# Patient Record
Sex: Female | Born: 1960 | Race: Black or African American | Hispanic: No | Smoking: Never smoker
Health system: Southern US, Community
[De-identification: ages and names within clinical notes are randomized; demographics above are authoritative.]

## PROBLEM LIST (undated history)

## (undated) DIAGNOSIS — K219 Gastro-esophageal reflux disease without esophagitis: Secondary | ICD-10-CM

## (undated) DIAGNOSIS — D649 Anemia, unspecified: Secondary | ICD-10-CM

## (undated) DIAGNOSIS — R0602 Shortness of breath: Secondary | ICD-10-CM

## (undated) DIAGNOSIS — E119 Type 2 diabetes mellitus without complications: Secondary | ICD-10-CM

## (undated) DIAGNOSIS — F410 Panic disorder [episodic paroxysmal anxiety] without agoraphobia: Secondary | ICD-10-CM

## (undated) DIAGNOSIS — I429 Cardiomyopathy, unspecified: Secondary | ICD-10-CM

## (undated) DIAGNOSIS — D131 Benign neoplasm of stomach: Secondary | ICD-10-CM

## (undated) DIAGNOSIS — E785 Hyperlipidemia, unspecified: Secondary | ICD-10-CM

## (undated) DIAGNOSIS — G43909 Migraine, unspecified, not intractable, without status migrainosus: Secondary | ICD-10-CM

## (undated) DIAGNOSIS — R011 Cardiac murmur, unspecified: Secondary | ICD-10-CM

## (undated) DIAGNOSIS — I10 Essential (primary) hypertension: Secondary | ICD-10-CM

## (undated) DIAGNOSIS — M1990 Unspecified osteoarthritis, unspecified site: Secondary | ICD-10-CM

## (undated) DIAGNOSIS — T7840XA Allergy, unspecified, initial encounter: Secondary | ICD-10-CM

## (undated) HISTORY — DX: Hyperlipidemia, unspecified: E78.5

## (undated) HISTORY — DX: Cardiac murmur, unspecified: R01.1

## (undated) HISTORY — DX: Gastro-esophageal reflux disease without esophagitis: K21.9

## (undated) HISTORY — DX: Anemia, unspecified: D64.9

## (undated) HISTORY — PX: VAGINAL HYSTERECTOMY: SUR661

## (undated) HISTORY — PX: COLONOSCOPY: SHX174

## (undated) HISTORY — DX: Unspecified osteoarthritis, unspecified site: M19.90

## (undated) HISTORY — PX: ENDOMETRIAL ABLATION: SHX621

## (undated) HISTORY — DX: Benign neoplasm of stomach: D13.1

## (undated) HISTORY — DX: Allergy, unspecified, initial encounter: T78.40XA

## (undated) HISTORY — PX: LEFT OOPHORECTOMY: SHX1961

---

## 1988-04-15 HISTORY — PX: CHOLECYSTECTOMY: SHX55

## 1998-07-20 ENCOUNTER — Other Ambulatory Visit: Admission: RE | Admit: 1998-07-20 | Discharge: 1998-07-20 | Payer: Self-pay | Admitting: Obstetrics and Gynecology

## 1998-10-18 ENCOUNTER — Other Ambulatory Visit: Admission: RE | Admit: 1998-10-18 | Discharge: 1998-10-18 | Payer: Self-pay | Admitting: Obstetrics and Gynecology

## 1998-10-19 ENCOUNTER — Encounter (INDEPENDENT_AMBULATORY_CARE_PROVIDER_SITE_OTHER): Payer: Self-pay | Admitting: Specialist

## 1998-10-19 ENCOUNTER — Other Ambulatory Visit: Admission: RE | Admit: 1998-10-19 | Discharge: 1998-10-19 | Payer: Self-pay | Admitting: Obstetrics and Gynecology

## 1999-02-13 ENCOUNTER — Other Ambulatory Visit: Admission: RE | Admit: 1999-02-13 | Discharge: 1999-02-13 | Payer: Self-pay | Admitting: Obstetrics and Gynecology

## 2000-12-18 ENCOUNTER — Other Ambulatory Visit: Admission: RE | Admit: 2000-12-18 | Discharge: 2000-12-18 | Payer: Self-pay | Admitting: Obstetrics and Gynecology

## 2000-12-24 ENCOUNTER — Ambulatory Visit (HOSPITAL_COMMUNITY): Admission: RE | Admit: 2000-12-24 | Discharge: 2000-12-24 | Payer: Self-pay

## 2001-04-15 HISTORY — PX: ORIF TIBIA & FIBULA FRACTURES: SHX2131

## 2001-04-16 ENCOUNTER — Inpatient Hospital Stay (HOSPITAL_COMMUNITY): Admission: AD | Admit: 2001-04-16 | Discharge: 2001-04-17 | Payer: Self-pay | Admitting: *Deleted

## 2001-04-16 ENCOUNTER — Encounter: Payer: Self-pay | Admitting: *Deleted

## 2001-08-13 ENCOUNTER — Encounter: Payer: Self-pay | Admitting: Orthopedic Surgery

## 2001-08-13 ENCOUNTER — Encounter: Payer: Self-pay | Admitting: *Deleted

## 2001-08-14 ENCOUNTER — Inpatient Hospital Stay (HOSPITAL_COMMUNITY): Admission: EM | Admit: 2001-08-14 | Discharge: 2001-08-16 | Payer: Self-pay | Admitting: *Deleted

## 2002-03-01 ENCOUNTER — Other Ambulatory Visit: Admission: RE | Admit: 2002-03-01 | Discharge: 2002-03-01 | Payer: Self-pay | Admitting: Obstetrics and Gynecology

## 2002-03-03 ENCOUNTER — Encounter (INDEPENDENT_AMBULATORY_CARE_PROVIDER_SITE_OTHER): Payer: Self-pay

## 2002-03-04 ENCOUNTER — Inpatient Hospital Stay (HOSPITAL_COMMUNITY): Admission: RE | Admit: 2002-03-04 | Discharge: 2002-03-05 | Payer: Self-pay | Admitting: Obstetrics and Gynecology

## 2003-05-24 ENCOUNTER — Encounter: Admission: RE | Admit: 2003-05-24 | Discharge: 2003-05-24 | Payer: Self-pay | Admitting: Family Medicine

## 2003-06-27 ENCOUNTER — Other Ambulatory Visit: Admission: RE | Admit: 2003-06-27 | Discharge: 2003-06-27 | Payer: Self-pay | Admitting: Obstetrics and Gynecology

## 2004-07-21 ENCOUNTER — Emergency Department (HOSPITAL_COMMUNITY): Admission: EM | Admit: 2004-07-21 | Discharge: 2004-07-21 | Payer: Self-pay | Admitting: Family Medicine

## 2005-12-25 ENCOUNTER — Encounter: Admission: RE | Admit: 2005-12-25 | Discharge: 2005-12-25 | Payer: Self-pay | Admitting: Family Medicine

## 2007-07-29 ENCOUNTER — Encounter: Admission: RE | Admit: 2007-07-29 | Discharge: 2007-07-29 | Payer: Self-pay | Admitting: Family Medicine

## 2007-09-30 ENCOUNTER — Encounter: Admission: RE | Admit: 2007-09-30 | Discharge: 2007-09-30 | Payer: Self-pay | Admitting: Obstetrics and Gynecology

## 2007-12-08 ENCOUNTER — Ambulatory Visit (HOSPITAL_BASED_OUTPATIENT_CLINIC_OR_DEPARTMENT_OTHER): Admission: RE | Admit: 2007-12-08 | Discharge: 2007-12-08 | Payer: Self-pay | Admitting: Family Medicine

## 2007-12-12 ENCOUNTER — Ambulatory Visit: Payer: Self-pay | Admitting: Internal Medicine

## 2009-02-23 ENCOUNTER — Emergency Department (HOSPITAL_COMMUNITY): Admission: EM | Admit: 2009-02-23 | Discharge: 2009-02-23 | Payer: Self-pay | Admitting: Emergency Medicine

## 2009-11-07 ENCOUNTER — Ambulatory Visit: Payer: Self-pay | Admitting: Internal Medicine

## 2009-11-07 DIAGNOSIS — K648 Other hemorrhoids: Secondary | ICD-10-CM | POA: Insufficient documentation

## 2009-11-07 DIAGNOSIS — S335XXA Sprain of ligaments of lumbar spine, initial encounter: Secondary | ICD-10-CM | POA: Insufficient documentation

## 2010-05-15 NOTE — Assessment & Plan Note (Signed)
Summary: blood in stool-lb   Vital Signs:  Patient profile:   50 year old female Height:      65 inches Weight:      208.75 pounds (94.89 kg) BMI:     34.86 O2 Sat:      96 % on Room air Temp:     98.4 degrees F (36.89 degrees C) oral Pulse rate:   87 / minute BP sitting:   122 / 76  (left arm) Cuff size:   large  Vitals Entered By: Brenton Grills MA (November 07, 2009 2:19 PM)  O2 Flow:  Room air CC: pt c/o blood in stool x 1 week, lower back pain/aj   CC:  pt c/o blood in stool x 1 week and lower back pain/aj.  History of Present Illness: Patient presents for an episode of bright red blood in stool and commode with BM. She had no pain, no history of hemorrhoids. she has had no weight loss or change in bowel habit. There is no family history for colon cancer.  She is also complaining of low back pain. She denies any injury, history of back surgery, no incontinence bowel or bladder, no paresthesia, weakness or difficulty walking.   Current Medications (verified): 1)  Vitamin D3 1000 Unit Caps (Cholecalciferol) .Marland Kitchen.. 1 Capsule By Mouth Once Daily 2)  Caduet 10-20 Mg Tabs (Amlodipine-Atorvastatin) .Marland Kitchen.. 1 Tablet By Mouth Once Daily 3)  Sodium Bicarbonate 650 Mg Tabs (Sodium Bicarbonate) .... Take 1-2 Tablets By Mouth Three Times A Day As Needed 4)  Losartan Potassium-Hctz 100-25 Mg Tabs (Losartan Potassium-Hctz) .Marland Kitchen.. 1 Tablet By Mouth Once Daily 5)  Bystolic 10 Mg Tabs (Nebivolol Hcl) .Marland Kitchen.. 1 Tablet By Mouth Once Daily 6)  Zonisamide 25 Mg Caps (Zonisamide) .... Take 4 Capsule By Mouth At Bedtime For 1 Week Then Increase By 1 Capsule Per Week  Allergies (verified): 1)  ! Codeine  Review of Systems       The patient complains of hematochezia and abnormal bleeding.  The patient denies anorexia, fever, weight loss, weight gain, chest pain, syncope, dyspnea on exertion, hemoptysis, abdominal pain, suspicious skin lesions, difficulty walking, and enlarged lymph nodes.    Physical  Exam  General:  overweight AA female in no distress Head:  normocephalic and atraumatic.   Eyes:  C&S clear Lungs:  normal respiratory effort.   Heart:  normal rate and regular rhythm.   Rectal:  Anoscopy: normal anus with no external abnormality, nl sphincter tone. Stool loose, green. Stool/mucosa heme negative above the anoscope. ring of internal hemorrhoids noted with withdrawal of anoscope.  Msk:  back exam: nl stand, flex, gait, toe/heel walk, step-up, SLR sitting, DTRs at patella, sensation to light touch, pin prick, deep vibratory sensation. No costrovertebral angle tenderness.   Impression & Recommendations:  Problem # 1:  HEMORRHOIDS, INTERNAL (ICD-455.0)  Patient with internal hemorrhoids as most likely source of bleeding.  Plan - sitz baths two times a day           anusol HC 2.5% per rectum two times a day.   Orders: Anoscopy ([address]109)  Problem # 2:  LUMBAR STRAIN (ICD-847.2) No radiuclar findings on exam. Tender to palpation of the paravertebral muscles.  Plan - NSAID of choice.   Complete Medication List: 1)  Vitamin D3 1000 Unit Caps (Cholecalciferol) .Marland Kitchen.. 1 capsule by mouth once daily 2)  Caduet 10-20 Mg Tabs (Amlodipine-atorvastatin) .Marland Kitchen.. 1 tablet by mouth once daily 3)  Sodium Bicarbonate 650 Mg Tabs (Sodium bicarbonate) .Marland KitchenMarland KitchenMarland Kitchen  Take 1-2 tablets by mouth three times a day as needed 4)  Losartan Potassium-hctz 100-25 Mg Tabs (Losartan potassium-hctz) .Marland Kitchen.. 1 tablet by mouth once daily 5)  Bystolic 10 Mg Tabs (Nebivolol hcl) .Marland Kitchen.. 1 tablet by mouth once daily 6)  Zonisamide 25 Mg Caps (Zonisamide) .... Take 4 capsule by mouth at bedtime for 1 week then increase by 1 capsule per week

## 2010-05-25 ENCOUNTER — Ambulatory Visit (INDEPENDENT_AMBULATORY_CARE_PROVIDER_SITE_OTHER): Payer: 59 | Admitting: Internal Medicine

## 2010-05-25 ENCOUNTER — Encounter: Payer: Self-pay | Admitting: Internal Medicine

## 2010-05-25 DIAGNOSIS — I10 Essential (primary) hypertension: Secondary | ICD-10-CM | POA: Insufficient documentation

## 2010-05-25 DIAGNOSIS — A088 Other specified intestinal infections: Secondary | ICD-10-CM

## 2010-05-25 DIAGNOSIS — J0100 Acute maxillary sinusitis, unspecified: Secondary | ICD-10-CM

## 2010-06-06 NOTE — Assessment & Plan Note (Signed)
Summary: VOMIT'G W/FACIAL HEAT--DIARRHEA  STC   Vital Signs:  Patient profile:   50 year old female Height:      65 inches Weight:      204 pounds BMI:     34.07 O2 Sat:      97 % on Room air Temp:     98.6 degrees F oral Pulse rate:   104 / minute BP sitting:   138 / 86  (left arm) Cuff size:   large  Vitals Entered By: Bill Salinas CMA (May 25, 2010 4:29 PM)  O2 Flow:  Room air CC: pt here with c/o nausea, vomitting and diarrhea x 1 week/ ab   Primary Care Provider:  Jacques Navy MD  CC:  pt here with c/o nausea and vomitting and diarrhea x 1 week/ ab.  History of Present Illness: [place] presents for 5 days of N/V/D- no blood or mucus in the stool, 8-10 loose stools daily. Emesis every 1-2 hrs and difficult to keep anyting down. She had a fever yesterday - 101. Diffuse abdominal pain until today.  She is not taking any otc medications. She has not been able to take in large amounts of fluid. Fortunately her symptoms are somewhat better today.   Having congestion with some difficutly  breathing at night. She reports pain in the facial area. She admits to sinus drainage that has a foul smell and bitter taste. She has had fever.   Current Medications (verified): 1)  Vitamin D3 1000 Unit Caps (Cholecalciferol) .Marland Kitchen.. 1 Capsule By Mouth Once Daily 2)  Caduet 10-20 Mg Tabs (Amlodipine-Atorvastatin) .Marland Kitchen.. 1 Tablet By Mouth Once Daily 3)  Losartan Potassium-Hctz 100-25 Mg Tabs (Losartan Potassium-Hctz) .Marland Kitchen.. 1 Tablet By Mouth Once Daily 4)  Bystolic 10 Mg Tabs (Nebivolol Hcl) .Marland Kitchen.. 1 Tablet By Mouth Once Daily  Allergies (verified): 1)  ! Codeine  Past History:  Past Medical History: UNSPECIFIED ESSENTIAL HYPERTENSION (ICD-401.9) LUMBAR STRAIN (ICD-847.2) HEMORRHOIDS, INTERNAL (ICD-455.0)   Physician Roster:                 cardiologist - Dr. Allyson Sabal                 gyn -             Dr. Rosalio Macadamia                 optom            Dr. 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  The patient denies decreased hearing, chest pain, syncope, dyspnea on exertion, hemoptysis, melena, hematochezia, incontinence, muscle weakness, difficulty walking, unusual weight change, abnormal bleeding, and enlarged lymph nodes.    Physical Exam  General:  WNWD AA female in no distress. No drop in BP or change in heart rate with orthostatics. Head:  normocephalic, atraumatic, and no abnormalities observed.  Minmal tenderness to percussion over the maxillary sinus. Eyes:  C&S clear without icterus, PERRLA Mouth:  Oral mucosa and oropharynx without lesions or exudates.  Teeth in good repair. Neck:  supple.   Lungs:  normal respiratory effort and normal breath sounds.   Heart:  normal rate and regular rhythm.   Abdomen:  soft, normal bowel sounds, no masses, no guarding, and no rigidity.  Diffuse mild tenderness Msk:  no joint tenderness, no joint warmth, and no redness over joints.   Pulses:  2+ radial Neurologic:  alert & oriented X3, cranial nerves II-XII intact, and gait normal.   Skin:  turgor normal, color normal, and no rashes.   Cervical Nodes:  no anterior cervical adenopathy and no posterior cervical adenopathy.   Psych:  Oriented X3, memory intact for recent and remote, normally interactive, and good eye contact.     Impression & Recommendations:  Problem # 1:  GASTROENTERITIS, VIRAL, ACUTE (ICD-008.8) N/V/D all c/w acute viral gastroenteritis without other findings on exam.  Plan  - hydrate!! mixed salt solutions           BRAT diet           call if unable to keep down fluids  Problem # 2:  ACUTE MAXILLARY SINUSITIS (ICD-461.0) symptoms c/w acute maxillary sinusitis with tenderness on exam.  Plan - Augmentin 875 two times a day x 7           nasal saline           supportive care.  Her updated medication list for this problem includes:    Amoxicillin-pot Clavulanate 875-125 Mg Tabs (Amoxicillin-pot clavulanate) .Marland Kitchen... 1 by mouth two times a day x 7 for sinus infection  Complete Medication List: 1)  Vitamin D3 1000 Unit Caps (Cholecalciferol) .Marland Kitchen.. 1 capsule by mouth once daily 2)  Caduet 10-20 Mg Tabs (Amlodipine-atorvastatin) .Marland Kitchen.. 1 tablet by mouth once daily 3)  Losartan Potassium-hctz 100-25 Mg Tabs (Losartan potassium-hctz) .Marland Kitchen.. 1 tablet by mouth once daily 4)  Bystolic 10 Mg Tabs (Nebivolol hcl) .Marland Kitchen.. 1 tablet by mouth once daily 5)  Promethazine Hcl 25 Mg Supp (Promethazine hcl) .Marland Kitchen.. 1 pr q 6 x 4 then as needed 6)  Amoxicillin-pot Clavulanate 875-125 Mg Tabs (Amoxicillin-pot clavulanate) .Marland Kitchen.. 1 by mouth two times a day x 7 for sinus infection  Patient Instructions: 1)  Nausea and vomiting - most likely a viral infection - viral gastroenteritis. Plan - phenergan suppository 25mg  per rectum every 6 hours on schedule x 4 doses then as needed. Immodium AD if needed for diarrhea. DRINK FLUIDS - sports drinks, ginger ale, no caffeinated beverages (allclears); light diet - soups consume etc, banannas rice, etc. Call if you cannot keep fluids down. 2)  Sinus infection - very tender, foul smelling mucus. Plan - augmentin two times a day for 7 days, nasal saline wash. Tyelnol if needed.  Prescriptions: AMOXICILLIN-POT CLAVULANATE 875-125 MG TABS (AMOXICILLIN-POT CLAVULANATE) 1 by mouth two times a day x 7 for sinus infection  #14 x 0   Entered and Authorized by:   Jacques Navy MD   Signed by:   Jacques Navy MD on 05/25/2010   Method used:   Electronically to         [facility] [phone]* (retail)       [address]       [place], [place]  [address]       Ph: [phone]       Fax: [phone]   RxID:   [id number] PROMETHAZINE HCL 25 MG SUPP (PROMETHAZINE HCL) 1 pr q 6 x 4 then as needed  #12 x  1   Entered and Authorized by:   Jacques Navy MD   Signed by:   Jacques Navy MD on 05/25/2010   Method used:   Electronically to        [facility] [phone]* (retail)       [address]       [place], [place]  [address]       Ph: [phone]       Fax: [phone]   RxID:   [id number]    Orders Added: 1)  Est. Patient Level III [24401]

## 2010-06-11 ENCOUNTER — Telehealth: Payer: Self-pay | Admitting: Internal Medicine

## 2010-06-12 ENCOUNTER — Encounter: Payer: Self-pay | Admitting: Internal Medicine

## 2010-06-12 ENCOUNTER — Other Ambulatory Visit: Payer: Self-pay | Admitting: Internal Medicine

## 2010-06-12 ENCOUNTER — Other Ambulatory Visit: Payer: 59

## 2010-06-12 ENCOUNTER — Ambulatory Visit (INDEPENDENT_AMBULATORY_CARE_PROVIDER_SITE_OTHER)
Admission: RE | Admit: 2010-06-12 | Discharge: 2010-06-12 | Disposition: A | Discharge status: Home / Self Care | Payer: 59 | Source: Ambulatory Visit | Attending: Internal Medicine | Admitting: Internal Medicine

## 2010-06-12 ENCOUNTER — Ambulatory Visit (INDEPENDENT_AMBULATORY_CARE_PROVIDER_SITE_OTHER): Payer: 59 | Admitting: Internal Medicine

## 2010-06-12 DIAGNOSIS — K5732 Diverticulitis of large intestine without perforation or abscess without bleeding: Secondary | ICD-10-CM

## 2010-06-12 DIAGNOSIS — K2900 Acute gastritis without bleeding: Secondary | ICD-10-CM | POA: Insufficient documentation

## 2010-06-12 DIAGNOSIS — R1032 Left lower quadrant pain: Secondary | ICD-10-CM

## 2010-06-12 DIAGNOSIS — K5792 Diverticulitis of intestine, part unspecified, without perforation or abscess without bleeding: Secondary | ICD-10-CM

## 2010-06-12 HISTORY — DX: Essential (primary) hypertension: I10

## 2010-06-12 LAB — CBC WITH DIFFERENTIAL/PLATELET
Basophils Absolute: 0 10*3/uL (ref 0.0–0.1)
Eosinophils Relative: 1.3 % (ref 0.0–5.0)
HCT: 33.8 % — ABNORMAL LOW (ref 36.0–46.0)
Hemoglobin: 11.4 g/dL — ABNORMAL LOW (ref 12.0–15.0)
Lymphs Abs: 3.1 10*3/uL (ref 0.7–4.0)
MCV: 89.9 fl (ref 78.0–100.0)
Monocytes Absolute: 0.4 10*3/uL (ref 0.1–1.0)
Monocytes Relative: 4.4 % (ref 3.0–12.0)
Neutro Abs: 6.1 10*3/uL (ref 1.4–7.7)
Platelets: 319 10*3/uL (ref 150.0–400.0)
RDW: 14 % (ref 11.5–14.6)

## 2010-06-12 LAB — BASIC METABOLIC PANEL
BUN: 12 mg/dL (ref 6–23)
Chloride: 100 mEq/L (ref 96–112)
GFR: 131.95 mL/min (ref >60.00)
Glucose, Bld: 123 mg/dL — ABNORMAL HIGH (ref 70–99)
Potassium: 4 mEq/L (ref 3.5–5.1)
Sodium: 137 mEq/L (ref 135–145)

## 2010-06-12 MED ORDER — IOHEXOL 300 MG/ML  SOLN
100.0000 mL | Freq: Once | INTRAMUSCULAR | Status: AC | PRN
  Administered 2010-06-12: 100 mL via INTRAVENOUS

## 2010-06-21 NOTE — Progress Notes (Signed)
Summary: indigestion  Phone Note Call from Patient   Summary of Call: Pt c/o h/a and severe indigestion. She has tried baking soda and water once which gave her some relief for only about 1 hour. Pt has apt tomorrow am and wants MD's suggestion while waiting on apt.  Initial call taken by: Lamar Sprinkles, CMA,  June 11, 2010 9:47 AM  Follow-up for Phone Call        OK to use otc zantac 150mg  two times a day  Follow-up by: Jacques Navy MD,  June 11, 2010 11:04 AM  Additional Follow-up for Phone Call Additional follow up Details #1::        Pt informed  Additional Follow-up by: Lamar Sprinkles, CMA,  June 11, 2010 11:29 AM

## 2010-06-21 NOTE — Assessment & Plan Note (Signed)
Summary: indigestion,headache--pt leaving triage message   Vital Signs:  Patient profile:   50 year old female Height:      65 inches Weight:      212 pounds BMI:     35.41 O2 Sat:      96 % on Room air Temp:     97.8 degrees F oral Pulse rate:   98 / minute BP sitting:   142 / 90  (left arm) Cuff size:   large  Vitals Entered By: Bill Salinas CMA (June 12, 2010 9:13 AM)  O2 Flow:  Room air CC: pt here with c/o nausea with acid reflux symptoms, throat burning/ ab Comments Pt has finished Amoxicillin/ ab   Primary Care Provider:  Jacques Navy MD  CC:  pt here with c/o nausea with acid reflux symptoms and throat burning/ ab.  History of Present Illness: Patient reports a 17 day h/o upper abdominal pain that is progressively worse. She will have discomfort in her throat and substernal region. Her symptoms are worse after eating. She tried baking soda and water that did not help. She has had nausea without emesis. Starting yesteray she took zantac without immediate relief but she did have some improvement today. Seh has had no hemetemesis, melana or hematochezia. She has had no fever, chills or change in bowel habit.   Current Medications (verified): 1)  Vitamin D3 1000 Unit Caps (Cholecalciferol) .Marland Kitchen.. 1 Capsule By Mouth Once Daily 2)  Caduet 10-20 Mg Tabs (Amlodipine-Atorvastatin) .Marland Kitchen.. 1 Tablet By Mouth Once Daily 3)  Losartan Potassium-Hctz 100-25 Mg Tabs (Losartan Potassium-Hctz) .Marland Kitchen.. 1 Tablet By Mouth Once Daily 4)  Bystolic 10 Mg Tabs (Nebivolol Hcl) .Marland Kitchen.. 1 Tablet By Mouth Once Daily 5)  Promethazine Hcl 25 Mg Supp (Promethazine Hcl) .Marland Kitchen.. 1 Pr Q 6 X 4 Then As Needed 6)  Amoxicillin-Pot Clavulanate 875-125 Mg Tabs (Amoxicillin-Pot Clavulanate) .Marland Kitchen.. 1 By Mouth Two Times A Day X 7 For Sinus Infection  Allergies (verified): 1)  ! Codeine  Past History:  Past Medical History: Last updated: 17-Jun-2010 UNSPECIFIED ESSENTIAL HYPERTENSION (ICD-401.9) LUMBAR STRAIN  (ICD-847.2) HEMORRHOIDS, INTERNAL (ICD-455.0)   Physician Roster:                 cardiologist - Dr. Allyson Sabal                 gyn -             Dr. Rosalio Macadamia                 optom            Dr. 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  The patient denies anorexia, fever, weight loss, weight gain, hoarseness, chest pain, syncope, peripheral edema, prolonged cough, melena, hematochezia, incontinence, difficulty walking, and abnormal bleeding.    Physical Exam  General:  obese AA woman in no acute distress Head:  Normocephalic and atraumatic without obvious abnormalities. No apparent alopecia or balding. Eyes:  no  sclearal icterus Mouth:  no oral lesions Neck:  supple.   Lungs:  normal respiratory effort and normal breath sounds.   Heart:  normal rate and regular rhythm.   Abdomen:  She is very senstive to touch and does not like to have her abdomen examined - thus limiting exam. She has moderate tenderness in the epigatric area. she has tenderness in the LLQ. No guarding or rebound.  Neurologic:  alert & oriented X3, cranial nerves II-XII intact, and gait normal.   Skin:  turgor normal and color normal.   Psych:  normally interactive and good eye contact.     Impression & Recommendations:  Problem # 1:  ACUTE GASTRITIS WITHOUT MENTION OF HEMORRHAGE (ICD-535.00) Patient with symptoms of gastritis/dyspepsia. No signs of bleeding.  Plan - continue with zantac 150mg  two  times a day           if no relief of discomfort she will be started on a PPI  Her updated medication list for this problem includes:    Zantac 150 Mg Tabs (Ranitidine hcl) .Marland Kitchen... 1 by mouth two times a day for gastritis.  Problem # 2:  ABDOMINAL PAIN, LEFT LOWER QUADRANT (ICD-789.04) Patient with acute LLQ abdominal pain worrisome for diverticulitis.  Plan - start Augmentin 875mg  two times a day x 7 dyas           CT abd/pelvis with contrast  Orders: TLB-BMP (Basic Metabolic Panel-BMET) (80048-METABOL) TLB-CBC Platelet - w/Differential (85025-CBCD) Radiology Referral (Radiology)  Addendum - CT negative for diverticulitis.  Called pt - 2/29 left a msg: stop augmentin. call if abdominal pain doesn't improve.   Complete Medication List: 1)  Vitamin D3 1000 Unit Caps (Cholecalciferol) .Marland Kitchen.. 1 capsule by mouth once daily 2)  Caduet 10-20 Mg Tabs (Amlodipine-atorvastatin) .Marland Kitchen.. 1 tablet by mouth once daily 3)  Losartan Potassium-hctz 100-25 Mg Tabs (Losartan potassium-hctz) .Marland Kitchen.. 1 tablet by mouth once daily 4)  Bystolic 10 Mg Tabs (Nebivolol hcl) .Marland Kitchen.. 1 tablet by mouth once daily 5)  Promethazine Hcl 25 Mg Supp (Promethazine hcl) .Marland Kitchen.. 1 pr q 6 x 4 then as needed 6)  Amoxicillin-pot Clavulanate 875-125 Mg Tabs (Amoxicillin-pot clavulanate) .Marland Kitchen.. 1 by mouth two times a day x 10 for possible diverticulitis 7)  Zantac 150 Mg Tabs (Ranitidine hcl) .Marland Kitchen.. 1 by mouth two times a day for gastritis.  Patient Instructions: 1)  upper abdominal pain - more likely acid related gastric irritation/gastritis. Plan - cointinue the zantac two times a day. 2)  Lower abdominal pain with decreased bowel sounds - may be diverticulitis. Plan - lab - blood count and chemistry; CT scan of the abdomen and pelvis; augmentin 875mg   two times a day x 10 days. Prescriptions: AMOXICILLIN-POT CLAVULANATE 875-125 MG TABS (AMOXICILLIN-POT CLAVULANATE) 1 by mouth two times a day x 10 for possible diverticulitis  #20 x 0    Entered and Authorized by:   Jacques Navy MD   Signed by:   Jacques Navy MD on 06/12/2010   Method used:   Print then Give to Patient   RxID:   [id number]    Orders Added: 1)  TLB-BMP (Basic Metabolic Panel-BMET) [80048-METABOL] 2)  TLB-CBC Platelet - w/Differential [85025-CBCD] 3)  Radiology Referral [Radiology] 4)  Est. Patient Level IV [96295]

## 2010-06-25 ENCOUNTER — Telehealth: Payer: Self-pay | Admitting: Internal Medicine

## 2010-06-27 ENCOUNTER — Telehealth: Payer: Self-pay | Admitting: Internal Medicine

## 2010-06-28 ENCOUNTER — Telehealth: Payer: Self-pay | Admitting: Internal Medicine

## 2010-07-03 NOTE — Progress Notes (Signed)
Summary: Headache  Phone Note Call from Patient Call back at Home Phone [phone]   Caller: Patient Summary of Call: 1) Pt states that she still has Headache and request callback to discuss. 2) LMOM for Pt to clarify if headache is worse and if she has been taking meds as instructed (claritin & BP)  Please advise if Pt needs [place] appt. Initial call taken by: Burnard Leigh [facility]),  June 28, 2010 10:50 AM  Follow-up for Phone Call        [place] she taking claritin? Is she taking BP meds? Does she have any associated symptoms_ nosebleed, double vision. etc.  Follow-up by: Jacques Navy MD,  June 28, 2010 1:20 PM  Additional Follow-up for Phone Call Additional follow up Details #1::        Left another detailed vm, informed of info reg sat clinic and to call monday w/any problems Additional Follow-up by: Lamar Sprinkles, CMA,  June 29, 2010 6:04 PM

## 2010-07-03 NOTE — Progress Notes (Signed)
Summary: RF?   Phone Note Call from Patient   Summary of Call: Pt c/o h/a and says claritin has not helped. Also needs refill of bp med, waiting to hear from pharm.  Initial call taken by: Lamar Sprinkles, CMA,  June 25, 2010 9:35 AM  Follow-up for Phone Call        left mess to call office back........................Marland KitchenLamar Sprinkles, CMA  June 25, 2010 7:00 PM   Spoke w/pt -  1. Rfs done, has been off BP meds x 3 days, advised to fill asap, pt agreed 2. FMLA forms dropped off for MD to complete 3. Stil having abd discomfort but has not taken ranitidine as advised. She will start taking med two times a day today and call office w/any further problems.  Follow-up by: Lamar Sprinkles, CMA,  June 26, 2010 12:53 PM    Prescriptions: ZANTAC 150 MG TABS (RANITIDINE HCL) 1 by mouth two times a day for gastritis.  #180 x 1   Entered by:   Lamar Sprinkles, CMA   Authorized by:   Jacques Navy MD   Signed by:   Lamar Sprinkles, CMA on 06/26/2010   Method used:   Electronically to        [facility] [phone]* (retail)       [address]       [place], [place]  [address]       Ph: [phone]       Fax: [phone]   RxID:   [id number] BYSTOLIC 10 MG TABS (NEBIVOLOL HCL) 1 tablet by mouth once daily  #90 x 1   Entered by:   Lamar Sprinkles, CMA   Authorized by:   Jacques Navy MD   Signed by:   Lamar Sprinkles, CMA on 06/26/2010   Method used:   Electronically to        [facility] [phone]* (retail)       [address]       [place], [place]  [address]       Ph: [phone]       Fax: [phone]   RxID:   [id number] LOSARTAN POTASSIUM-HCTZ 100-25 MG TABS (LOSARTAN POTASSIUM-HCTZ) 1 tablet by mouth once daily  #90 x 1   Entered by:   Lamar Sprinkles, CMA   Authorized by:   Jacques Navy MD   Signed by:   Lamar Sprinkles, CMA on 06/26/2010   Method used:   Electronically to        [facility] [phone]* (retail)       [address]       [place],  [place]  [address]       Ph: [phone]       Fax: [phone]   RxID:   [id number] CADUET 10-20 MG TABS (AMLODIPINE-ATORVASTATIN) 1 tablet by mouth once daily  #90 x 1   Entered by:   Lamar Sprinkles, CMA   Authorized by:   Jacques Navy MD   Signed by:   Lamar Sprinkles, CMA on 06/26/2010   Method used:   Electronically to        [facility] [phone]* (retail)       [address]       [place], [place]  [address]       Ph: [phone]       Fax: [phone]   RxID:   [id number]

## 2010-07-03 NOTE — Progress Notes (Signed)
  Phone Note Outgoing Call   Call placed by: Ami Bullins CMA,  June 27, 2010 9:28 AM Call placed to: Patient Details for Reason: FMLA Summary of Call: called pt to inform her that her FMLA papers have been mailed . I have also made a copy and sent down to medical records. waiting on pt to return my call Initial call taken by: Ami Bullins CMA,  June 27, 2010 9:29 AM     Appended Document:  patient called back and was informed

## 2010-07-17 ENCOUNTER — Other Ambulatory Visit: Payer: Self-pay | Admitting: *Deleted

## 2010-07-17 NOTE — Telephone Encounter (Signed)
Pt c/o continued reflux. She has gotten no relief from zantac suggested by MD. Has been watching what she is eating and sitting up after eating but still has very uncomfortable gerd. Please advise.

## 2010-07-17 NOTE — Telephone Encounter (Signed)
Will start nexium 40mg  q A M. Called and left a message for the patient. She will need to let us know which pharmacy to send Rx to.

## 2010-07-18 LAB — COMPREHENSIVE METABOLIC PANEL
Alkaline Phosphatase: 60 U/L (ref 39–117)
BUN: 8 mg/dL (ref 6–23)
Chloride: 104 mEq/L (ref 96–112)
Creatinine, Ser: 0.58 mg/dL (ref 0.4–1.2)
Glucose, Bld: 116 mg/dL — ABNORMAL HIGH (ref 70–99)
Potassium: 3.7 mEq/L (ref 3.5–5.1)
Total Bilirubin: 0.8 mg/dL (ref 0.3–1.2)

## 2010-07-18 LAB — POCT CARDIAC MARKERS
CKMB, poc: <1 ng/mL — ABNORMAL LOW (ref 1.0–8.0)
Myoglobin, poc: 44.1 ng/mL (ref 12–200)

## 2010-07-18 MED ORDER — ESOMEPRAZOLE MAGNESIUM 40 MG PO CPDR: 40.0000 mg | DELAYED_RELEASE_CAPSULE | Freq: Every day | ORAL | Status: DC

## 2010-07-18 NOTE — Telephone Encounter (Signed)
At this hour - will need ov tomorrow

## 2010-07-18 NOTE — Telephone Encounter (Signed)
Left mess to call office back.   

## 2010-07-18 NOTE — Telephone Encounter (Signed)
Not comfortable calling in an inhaler: reviewed chart - no h/o asthma or respiratory disease, not on an inhaler.  Plan - OV today if breathing is a real problem - may see one of my colleages otherwise can be added to schedule tomorrow.

## 2010-07-18 NOTE — Telephone Encounter (Signed)
Pt states her Pharmacy is [facility] on [address] [place]. Pt also c/o wheezing that is worsening and request a Rx for Inhaler. Pt states that Claritin is not working. Pt states that she is having some difficulty w/breathing today but not as bad as yesterday.

## 2010-07-19 ENCOUNTER — Ambulatory Visit (INDEPENDENT_AMBULATORY_CARE_PROVIDER_SITE_OTHER): Payer: 59 | Admitting: Internal Medicine

## 2010-07-19 VITALS — BP 138/82 | HR 88 | Temp 98.4°F | Wt 214.0 lb

## 2010-07-19 DIAGNOSIS — J309 Allergic rhinitis, unspecified: Secondary | ICD-10-CM

## 2010-07-19 NOTE — Telephone Encounter (Signed)
Scheduled for today.

## 2010-07-19 NOTE — Patient Instructions (Signed)
Symptoms are more consistent with allergic rhinnitis with no sign of bacterial infection. There is no wheezing on exam and the oxygen concentration is normal at 96%. Plan - continue the claritin; take otc sudafed (generic) 30mg  twice a day and may increase to three times a day if needed; stove-top vaporizer with Vicks or methalatum to moisturize the sinuses and reduce congestion; nasonex 2 sprays to each nostril once a day.    Allergic Rhinitis Allergic rhinitis is when the mucous membranes in the nose respond to allergens. Allergens are particles in the air that cause your body to have an allergic reaction. This causes you to release allergic antibodies. Through a chain of events, these eventually cause you to release histamine into the blood stream (hence the use of antihistamines). Although meant to be protective to the body, it is this release that causes your discomfort, such as frequent sneezing, congestion and an itchy runny nose.   CAUSES The pollen allergens may come from grasses, trees, and weeds. This is seasonal allergic rhinitis, or "hay fever." Other allergens cause year-round allergic rhinitis (perennial allergic rhinitis) such as house dust mite allergen, pet dander and mold spores.   SYMPTOMS  Nasal stuffiness (congestion).   Runny, itchy nose with sneezing and tearing of the eyes.   There is often an itching of the mouth, eyes and ears.  It cannot be cured, but it can be controlled with medications. DIAGNOSIS If you are unable to determine the offending allergen, skin or blood testing may find it. TREATMENT  Avoid the allergen.   Medications and allergy shots (immunotherapy) can help.   Hay fever may often be treated with antihistamines in pill or nasal spray forms. Antihistamines block the effects of histamine. There are over-the-counter medicines that may help with nasal congestion and swelling around the eyes. Check with your caregiver before taking or giving this  medicine.  If the treatment above does not work, there are many new medications your caregiver can prescribe. Stronger medications may be used if initial measures are ineffective. Desensitizing injections can be used if medications and avoidance fails. Desensitization is when a patient is given ongoing shots until the body becomes less sensitive to the allergen. Make sure you follow up with your caregiver if problems continue. SEEK MEDICAL CARE IF:    You develop fever (more than 100.31F (38.1 C).   You develop a cough that does not stop easily (persistent).   You have shortness of breath.   You start wheezing.   Symptoms interfere with normal daily activities.  Document Released: 12/25/2000 Document Re-Released: 04/23/2009 [facility] Patient Information 2011 [place], [place].

## 2010-07-22 ENCOUNTER — Encounter: Payer: Self-pay | Admitting: Internal Medicine

## 2010-07-22 DIAGNOSIS — J309 Allergic rhinitis, unspecified: Secondary | ICD-10-CM | POA: Insufficient documentation

## 2010-07-22 NOTE — Progress Notes (Signed)
  Subjective:    Patient ID: Carolyn [name], female    DOB: 1960/08/13, 50 y.o.   MRN: [id number]  HPI Patient is seen acutely for c/o not being able to breath. She had called requesting a bronchodilator inhaler and was asked to come in for evaluation. She has been having nasal congestion. No fever, chills or rigors. She has had a minimal cough. She c/o that she has trouble breathing but does not report having any significant wheezing. She has had sneezing and rhinorrhea. Her symptoms are worse out-of-doors.    Review of Systems  Constitutional: Negative.   HENT: Positive for ear pain. Negative for hearing loss and ear discharge.        Increased pressure in both ears.  Eyes: Positive for itching.  Respiratory: Positive for cough and chest tightness. Negative for apnea, choking, shortness of breath and wheezing.   Cardiovascular: Negative.   Neurological: Negative.   Hematological: Negative.        Objective:   Physical Exam  Vitals reviewed. Constitutional: She is oriented to person, place, and time. She appears well-developed and well-nourished. No distress.  HENT:  Head: Normocephalic and atraumatic.  Right Ear: Tympanic membrane, external ear and ear canal normal. No drainage or tenderness. Tympanic membrane is not injected and not bulging.  Left Ear: Tympanic membrane, external ear and ear canal normal. No drainage or tenderness. Tympanic membrane is not injected and not bulging.  Mouth/Throat: Uvula is midline and mucous membranes are normal. Normal dentition. No oropharyngeal exudate, posterior oropharyngeal edema or posterior oropharyngeal erythema.       No significant tenderness to percussion over the frontal and maxillary sinus  Cardiovascular: Normal rate and regular rhythm.   Pulmonary/Chest: Effort normal and breath sounds normal. No respiratory distress. She has no decreased breath sounds. She has no wheezes. She has no rhonchi.  Neurological: She is alert and oriented  to person, place, and time.  Skin: Skin is warm and dry.  Psychiatric: She has a normal mood and affect. Her behavior is normal. Thought content normal.          Assessment & Plan:  . Allergic rhinnitis - no evidence of infection, no signs of bronchospasm.   Plan - otc non-sedating anti-histamine, low dose decongestant, supportive care.

## 2010-08-02 ENCOUNTER — Ambulatory Visit (INDEPENDENT_AMBULATORY_CARE_PROVIDER_SITE_OTHER): Payer: 59 | Admitting: Internal Medicine

## 2010-08-02 VITALS — BP 140/82 | HR 93 | Temp 98.7°F | Wt 212.0 lb

## 2010-08-02 DIAGNOSIS — M79609 Pain in unspecified limb: Secondary | ICD-10-CM

## 2010-08-02 DIAGNOSIS — M79672 Pain in left foot: Secondary | ICD-10-CM

## 2010-08-02 NOTE — Progress Notes (Signed)
  Subjective:    Patient ID: Carolyn [name], female    DOB: [birth year], 50 y.o.   MRN: [id number]  HPIMrs. Bohnsack presents for evaluation of a sore left foot. She has had a fracture of the left leg requiring ORIF. Over the past several days she has developed a tender nodule over the posterior ankle and has been having foot pain with walking. There is a h/o chronic pedal edema. She has had no fever, erythema of the foot, difficulty with weight bearing or walking.   Past Medical History: UNSPECIFIED ESSENTIAL HYPERTENSION (ICD-401.9) LUMBAR STRAIN (ICD-847.2) HEMORRHOIDS, INTERNAL (ICD-455.0)   Physician Roster:                 cardiologist - Dr. Allyson Sabal                 gyn -             Dr. Rosalio Macadamia                 optom            Dr. Shea Evans  Past Surgical History: Hysterectomy for endometrosis left oopherectomy Fx - left Tib/Fib ORIF   G2P2  Family History: Father-deceased @ 39:  emphysema Mother- 1925: alzheimers,  HTN Neg- colon cancer; CAD/MI;  sister - breast cancer sister - DM, emphysema  Social History: [employer] 1 year married '89- 2 boys - '93, '94 work - Clinical biochemist [employer] Marriage - good health        Review of Systems  Constitutional: Negative for fever, chills and activity change.  HENT: Negative.   Eyes: Negative.   Respiratory: Negative.  Negative for cough, shortness of breath and wheezing.   Cardiovascular: Negative.  Negative for chest pain and palpitations.  Musculoskeletal: Positive for joint swelling.       [Pain at the ankle and foot. Increased swelling of the foot.  [all other systems reviewed and are negative       Objective:   Physical Exam  [vitalsreviewed. Constitutional: She is oriented to person, place, and time. She appears well-developed and well-nourished. No distress.  Eyes: Conjunctivae and EOM are normal.  Neck: Neck supple.  Musculoskeletal:       Feet:       Tenderness at the left achilles tendon with a small  nodule/swollen area.  Tenderness to palpation of the dorsum of the left foot without palpable abnormality or deformity.  Neurological: She is alert and oriented to person, place, and time. She has normal reflexes.  Skin: Skin is warm and dry.  Psychiatric: She has a normal mood and affect. Her behavior is normal.          Assessment & Plan:  1. Left foot pain - no evidence of infection or deformity. No open lesion. Full range of motion but some mild tenderness. Good pulses: DP and PT. Picture c/w mild inflammation.  Plan - lineament of choice           Naproxen sodium           Range of motion exercise           Supportive lace-up shoes.

## 2010-08-03 ENCOUNTER — Encounter: Payer: Self-pay | Admitting: Internal Medicine

## 2010-08-08 ENCOUNTER — Ambulatory Visit (INDEPENDENT_AMBULATORY_CARE_PROVIDER_SITE_OTHER): Payer: 59 | Admitting: Internal Medicine

## 2010-08-08 ENCOUNTER — Encounter: Payer: Self-pay | Admitting: Internal Medicine

## 2010-08-08 VITALS — BP 140/90 | HR 72 | Temp 98.9°F | Ht 65.0 in | Wt 214.4 lb

## 2010-08-08 DIAGNOSIS — J069 Acute upper respiratory infection, unspecified: Secondary | ICD-10-CM

## 2010-08-08 DIAGNOSIS — J309 Allergic rhinitis, unspecified: Secondary | ICD-10-CM

## 2010-08-08 DIAGNOSIS — I10 Essential (primary) hypertension: Secondary | ICD-10-CM

## 2010-08-08 DIAGNOSIS — J302 Other seasonal allergic rhinitis: Secondary | ICD-10-CM | POA: Insufficient documentation

## 2010-08-08 MED ORDER — PREDNISONE 10 MG PO TABS: 10.0000 mg | ORAL_TABLET | Freq: Every day | ORAL | Status: AC

## 2010-08-08 MED ORDER — MELOXICAM 7.5 MG PO TABS: 7.5000 mg | ORAL_TABLET | Freq: Every day | ORAL | Status: DC

## 2010-08-08 NOTE — Assessment & Plan Note (Signed)
stable overall by hx and exam, most recent lab reviewed with pt, and pt to continue medical treatment as before  BP Readings from Last 3 Encounters:  08/08/10 140/90  08/02/10 140/82  07/19/10 138/82

## 2010-08-08 NOTE — Patient Instructions (Signed)
Take all new medications as prescribed Continue all other medications as before, and you can also take allegra OTC for allergies as well as needed Please see Dr Debby Bud if the symptoms persist or worsen

## 2010-08-08 NOTE — Assessment & Plan Note (Signed)
Marked flare seasonal - for predpack for home,  to f/u any worsening symptoms or concerns, ok for OTC allegra prn as well

## 2010-08-08 NOTE — Progress Notes (Signed)
Subjective:    Patient ID: Carolyn [name], female    DOB: [birth year], 50 y.o.   MRN: [id number]  HPI  Here with acute onset 2 days bi-frontal HA/forhead, started constant, moderate, dull now more severe later today so decided to come for evaluation;  better with ibuprofen otc;  excedrin migraine seemed to cause dizziness and did not seem to help with pain;  All assoc with general malaise and head congestion, fullness without hearing loss, vertigo, ST , sinus pain, cough, blurred vision  And Pt denies chest pain, increased sob or doe, wheezing, orthopnea, PND, increased LE swelling, palpitations, dizziness or syncope.  Pt denies new neurological symptoms such as  facial or extremity weakness or numbness.   Pt denies polydipsia, polyuria.  Overall good compliance with treatment, and good medicine tolerability.  BP elsewhere usually < 140/90 per pt.  Has had mild sinus/nasal allergy symptoms over the past few wks as well with occasional itch and sneeze, clear drainage without fever.   Pt denies fever, wt loss, night sweats, loss of appetite, or other constitutional symptoms Past Medical History  Diagnosis Date  . Hypertension   . Diabetes mellitus     diet controlled   Past Surgical History  Procedure Date  . Abdominal hysterectomy     left oopherectomy  . Orif tibia & fibula fractures     Left    reports that she has never smoked. She does not have any smokeless tobacco history on file. Her alcohol and drug histories not on file. family history includes Alzheimer's disease in her mother; Cancer in her sister; Diabetes in her sister; Emphysema in her father and sister; and Hypertension in her mother. Allergies  Allergen Reactions  . Codeine    Current Outpatient Prescriptions on File Prior to Visit  Medication Sig Dispense Refill  . amlodipine-atorvastatin (CADUET) 10-20 MG per tablet Take 1 tablet by mouth daily.        . Cholecalciferol (VITAMIN D3) 1000 UNITS CAPS Take by mouth daily.         Marland Kitchen esomeprazole (NEXIUM) 40 MG capsule Take 1 capsule (40 mg total) by mouth daily.  30 capsule  11  . nebivolol (BYSTOLIC) 10 MG tablet Take 10 mg by mouth daily.        . promethazine (PHENERGAN) 25 MG suppository Place 25 mg rectally every 6 (six) hours as needed.        . ranitidine (ZANTAC) 150 MG tablet Take 150 mg by mouth 2 (two) times daily.        Marland Kitchen DISCONTD: LOSARTAN POTASSIUM-HCTZ PO Take by mouth.             Review of Systems All otherwise neg per pt     Objective:   Physical Exam BP 140/90  Pulse 72  Temp(Src) 98.9 F (37.2 C) (Oral)  Ht 5\' 5"  (1.651 m)  Wt 214 lb 6 oz (97.24 kg)  BMI 35.67 kg/m2  SpO2 99% Physical Exam  VS noted Constitutional: Pt appears well-developed and obese  Mild ill appearing HENT: Head: Normocephalic.  Left tm mod erythema, nonbulging, right TM clear, canals clear Right Ear: External ear normal.  Sinus nontender Left Ear: External ear normal.   Pharynx mild erythema, no exudate Eyes: Conjunctivae and EOM are normal. Pupils are equal, round, and reactive to light.  Neck: Normal range of motion. Neck supple.  Cardiovascular: Normal rate and regular rhythm.   Pulmonary/Chest: Effort normal and breath sounds normal.  Abd:  Soft, NT,  non-distended, + BS Neurological: Pt is alert. No cranial nerve deficit.  Skin: Skin is warm. No erythema.  Psychiatric: Pt behavior is normal. Thought content normal.         Assessment & Plan:

## 2010-08-08 NOTE — Assessment & Plan Note (Addendum)
Mild suspect viral, but could be cause of HA - for meloxicam prn , and mucinex otc prn

## 2010-08-09 ENCOUNTER — Telehealth: Payer: Self-pay

## 2010-08-09 ENCOUNTER — Telehealth: Payer: Self-pay | Admitting: *Deleted

## 2010-08-09 NOTE — Telephone Encounter (Signed)
A user error has taken place: encounter opened in error, closed for administrative reasons.

## 2010-08-09 NOTE — Telephone Encounter (Signed)
Pt was seen yesterday by Dr Jonny Ruiz b/c she had h/a x 2 days that was not her "normal migraine". Pt left vm today - she continues to have h/a today but realized that she had not taken her losartan x 1 week. She restarted med today and wants to know how MD feels about this?

## 2010-08-09 NOTE — Telephone Encounter (Signed)
Reviewed Dr. Raphael Gibney note: seasonal allergy as cause vs. HTN. 1) agree with treatment of seasonal allergy - otc allegra 2) headache may be associated with BP - definitely resume BP meds. 3) ROV for unremitting headache.

## 2010-08-10 NOTE — Telephone Encounter (Signed)
Left detailed vm for pt (ok per HIPPA form)

## 2010-08-28 NOTE — Procedures (Signed)
NAME:  Carolyn [name], Carolyn [name] NO.:  [id number]   MEDICAL RECORD NO.:  [id number]          PATIENT TYPE:  OUT   LOCATION:  SLEEP CENTER                 FACILITY:  [facility]   PHYSICIAN:  Clinton D. Maple Hudson, MD, FCCP, FACPDATE OF BIRTH:  May 14, 1960   DATE OF STUDY:  12/08/2007                            NOCTURNAL POLYSOMNOGRAM   REFERRING PHYSICIAN:  Knox Royalty, M.D.   REFERRING PHYSICIAN:  Dr. Knox Royalty.   INDICATION FOR STUDY:  Hypersomnia with sleep apnea.   EPWORTH SLEEPINESS SCORE:  8/24, BMI 34.8, weight 209 pounds, height 65  inches, neck 16 inches.   HOME MEDICATIONS:  Are charted and reviewed.   SLEEP ARCHITECTURE:  Total sleep time 365 minutes with sleep efficiency  84.2%, stage I 4.5%, stage II 82.8%, stage III absent, REM 12.7% of  total sleep time, sleep latency 34 minutes, REM latency 248 minutes,  awake after sleep onset 36 minutes, arousal index 7.9.  Ambien was taken  at bedtime.   RESPIRATORY DATA:  Apnea-hypopnea index (AHI) 9.2 per hour, respiratory  disturbance index (RDI) 11.5 per hour, 56 events were counted, all  hypopneas.  All events were reported as nonsupine.  REM AHI 40 per hour.  Technician indicated there were not enough early events to permit CPAP  titration by split protocol on this study night.   OXYGEN DATA:  Moderate snoring with oxygen desaturation to a nadir of  84%, mean oxygen saturation through the study was 94.4% on room air.   CARDIAC DATA:  Sinus rhythm with PVCs.   MOVEMENT/PARASOMNIA:  Limb jerks were noted, but none associated with  arousal or awakening.  No bathroom trips.   IMPRESSIONS/RECOMMENDATIONS:  1. Mild obstructive sleep apnea/hypopnea syndrome.  Apnea-hypopnea      index 9.2 per hour with most events recorded while nonsupine.      Rapid eye movement apnea-hypopnea index 40 per hour.  Moderate      snoring with oxygen desaturation to a nadir of 84%.  2. Scores in this range may respond to conservative  measures including      weight loss, treatment for nasal      congestion, and encouragement to sleep on flatter back.  Otherwise,      consider return for continuous positive airway pressure titration      or evaluation for alternative management as appropriate.      Clinton D. Maple Hudson, MD, [facility], FACP  Diplomate, Biomedical engineer of Sleep Medicine  Electronically Signed     CDY/MEDQ  D:  12/12/2007 12:48:06  T:  12/12/2007 13:53:18  Job:  [id number]   cc:   Knox Royalty, M.D.

## 2010-08-31 NOTE — Discharge Summary (Signed)
NAME:  Carolyn [name], Carolyn [name]                         ACCOUNT NO.:  [id number]   MEDICAL RECORD NO.:  [id number]                   PATIENT TYPE:  INP   LOCATION:  9308                                 FACILITY:  [facility]   PHYSICIAN:  Sherry A. Rosalio Macadamia, M.D.           DATE OF BIRTH:  [birth year]   DATE OF ADMISSION:  03/03/2002  DATE OF DISCHARGE:  03/05/2002                                 DISCHARGE SUMMARY   PROBLEMS:  Pelvic pain, endometriosis.   SUBJECTIVE:  The patient is a 50 year old G2, P2-0-0-2 woman who has had  severe pelvic pain for several years.  The patient's pain is worse with her  period, but also experiences pain prior to her menstrual cycle.  The patient  had a tubal ligation in 1994 at which time she was noted to have engorged  pelvic vessels and mild endometriosis.  The patient was treated with Lupron  therapy for her pain which allowed her pain to resolve.  Initially, she was  to have her surgery after approximately three months.  However, the patient  broke her leg and surgery was postponed until this time.   PHYSICAL EXAMINATION:  HEENT:  Within normal limits.  NECK:  Without any lymphadenopathy.  Thyroid without nodule.  CHEST:  Clear to auscultation.  HEART:  Regular rhythm without murmur.  BREASTS:  Without mass.  CVA nontender.  ABDOMEN:  Soft, obese, nontender without mass.  PELVIC:  External genitalia within normal limits.  Cervix within normal  limits.  Uterus 8-9 weeks size.  Uterus anteflexed, nontender, and mobile.  Adnexa without mass.  Rectovaginal examination without nodule.   HOSPITAL COURSE:  The patient was admitted and brought to the operating room  where an LAVH/RSO was performed.  There were no complications from surgery  and the patient did well postoperatively.  Her hemoglobin on her first  postoperative day was 29.3 with a white count of 12.3.  The patient was not  ready for discharge on her first postoperative day because of nausea and  vomiting.  The patient was treated with Zofran and Claritin 5 mg b.i.d.  Once the patient was able to tolerate fluids, this was the afternoon and  evening of her first hospital day, she was admitted overnight and on her  second hospital day she felt well.  She was discharged to home status post  LAVH/RSO.  Planned follow-up in the office in approximately three weeks.  The patient will call if she has a temperature greater than 100, heavy  bleeding, or severe pain.  She is discharged on Allegra at home for  congestion, Darvocet-N 100 or Tylenol or Advil.                                               Sherry A. Rosalio Macadamia, M.D.  SAD/MEDQ  D:  04/01/2002  T:  04/02/2002  Job:  [id number]

## 2010-08-31 NOTE — Discharge Summary (Signed)
[facility]  Patient:    Carolyn [name], Carolyn [name] Visit Number: [id number] MRN: [id number]          Service Type: GYN Location: 9300 [id number] Attending Physician:  Ermalene Searing Dictated by:   Marina Gravel, M.D. Admit Date:  04/15/2001 Discharge Date: 04/17/2001                             Discharge Summary  ADMISSION DIAGNOSIS:  Pyelonephritis.  PROCEDURES:  Intravenous antibiotics.  HISTORY OF PRESENT ILLNESS:  The patient is a 50 year old African-American female, gravida 2, para 2, who presented with flank pain, fever to 102, chills, and sweats.  She had been treated recently in the office for an urinary tract infection with Septra.  Her urine culture showed E. coli which was sensitive to Septra and all other antibiotics, however, the patients symptoms of urinary frequency and the above mentioned symptoms had progressively worsened over the 2 to 3 days prior to admission.  On admission, the patient had a fever of 102.6, with significant flank pain and mildly toxic appearance.  HOSPITAL COURSE:  The patient was started on intravenous Rocephin 2 g IV q.24h.  Renal ultrasound showed no abnormalities of the kidneys, and no evidence of nephrolithiasis.  Urine and blood cultures showed no growth.  Clinically, the patient responded promptly to the intravenous antibiotics and remained afebrile for over 24 hours by the third hospital day.  In addition, her previously noted CVA tenderness completely resolved.  Given this was the case, she was determined to be clinically improved, and deemed satisfactory for discharge.  DISCHARGE MEDICATIONS: 1. Keflex 500 mg p.o. q.i.d. x 10 days. 2. Procardia 60 mg q.d. 3. Hydrochlorothiazide 25 mg q.d. as prior to admission.  [facility]LLOWUP[facility]ins [facility] with Dr. Rosalio Macadamia (the patient already had the appointment) on 04/28/01.  DISCHARGE INSTRUCTIONS:  Complete antibiotics as outlined above.  Notify with worsening  urinary symptoms, back pain, flank pain, or fever.  ACTIVITY:  No restrictions.  CONDITION ON DISCHARGE:  Improved. Dictated by:   Marina Gravel, M.D. Attending Physician:  Marina Gravel B DD:  04/17/01 TD:  04/17/01 Job: [id number]

## 2010-08-31 NOTE — H&P (Signed)
[place]  Patient:    Carolyn [name], Carolyn [name] Visit Number: [id number] MRN: [id number]          Service Type: SUR Location: 1800 [id number] [id number] Attending Physician:  Carmelina Peal Dictated by:   Cammy Copa, M.D. Admit Date:  08/13/2001                           History and Physical  CHIEF COMPLAINT:  Left leg pain.  HISTORY OF PRESENT ILLNESS:  The patient is a 50 year old female who fell down the stairs today at 6:30.  She injured her left leg.  She denies any loss of consciousness, denies any other orthopedic complaints, last ate last night.  PAST MEDICAL HISTORY:  Significant for hypertension.  PAST SURGICAL HISTORY:  None but she does have a planned C-section for September 16, 2001.  FAMILY MEDICAL HISTORY:  Negative for DVT or pulmonary embolism.  MEDICATIONS:  Procardia and hydrochlorothiazide.  ALLERGIES:  CODEINE.  PHYSICAL EXAMINATION:  GENERAL:  She is alert and oriented x3.  CHEST:  Clear to auscultation.  HEART:  Heart beat is regular rhythm.  ABDOMEN:  Exam is benign.  EXTREMITIES:  Left lower extremity demonstrates DP 2+/4.  Sensation is intact to light touch on the dorsal and plantar aspects of the foot.  She has no pain with passive flexion and extension of the toes.  The compartments are soft. She has no knee effusion.  No groin tenderness with movement.  LABORATORY AND ACCESSORY DATA:  X-rays show a left tib-fib fracture at the junction of the distal two-thirds and the one-third aspect of the fibula and tibia.  IMPRESSION:  Left tibiofibular fracture with displacement.  PLAN:  IM nail plus-or-minus fibular ORIF.  Risks and benefits are discussed with the patient and her husband; that includes, but not limited to, nerve and vessel damage, nonunion, malunion, infection, DVT.  The patient understands the risks and benefits and will proceed. Dictated by:   Cammy Copa, M.D. Attending Physician:  Carmelina Peal DD:  08/13/01 TD:  08/13/01 Job: [id number]

## 2010-08-31 NOTE — Op Note (Signed)
[facility]  Patient:    Carolyn [name], Carolyn [name] Visit Number: [id number] MRN: [id number]          Service Type: DSU Location: [facility] Attending Physician:  Morene Antu Dictated by:   Sherry A. Rosalio Macadamia, M.D. Proc. Date: 12/24/00 Admit Date:  12/24/2000                             Operative Report  PREOPERATIVE DIAGNOSES:       1. Pelvic pain.                               2. History of endometriosis.  POSTOPERATIVE DIAGNOSES:      1. Pelvic pain.                               2. History of endometriosis.                               3. Right pelvic congestion and adhesions.  PROCEDURE:                    Diagnostic laparoscopy with cautery of                               endometriosis.  SURGEON:                      Sherry A. Rosalio Macadamia, M.D.  ANESTHESIA:                   General.  INDICATIONS:                  This is a 50 year old, G2, P2-0-0-2, woman who has had a history of endometriosis first diagnosed in 25. At that time, the patient had YAG laser ablation of the endometriosis. Since that time, the patients periods have gotten significantly worse with currently severe dysmenorrhea. No pain medicine seems to help. The patient has been considering repeat laparoscopy for several years. At this time, because of the severity of her pain, the patient requests repeat diagnostic laparoscopy.  FINDINGS:                     Eight to nine weeks size globular uterus. Right adnexal pelvic congestion with extremely dilated veins and adhesions of the right ovary to the sidewall, mild endometriosis in the cul-de-sac.  DESCRIPTION OF PROCEDURE:     The patient is brought into the operating room and given adequate general anesthesia. She is placed in the dorsal lithotomy position. Her abdomen and vagina are washed with Betadine. The bladder was in-and-out catheterized. Speculum was placed within the vagina after a pelvic examination had been  performed. A single tooth Hulka tenaculum was placed in the endometrial cavity in a retroverted fashion and rotated anteriorly. The speculum was removed, the surgeons gown and gloves were changed. The patient was draped in the sterile fashion. A subumbilical area was infiltrated with 0.25% Marcaine. An incision was made. Veress needle was introduced into the peritoneal cavity and placement was checked with saline. Approximately three liters of carbon dioxide was insufflated. Veress needle was removed and laparoscopic trocar was introduced into the peritoneal cavity.  Positive identification of pelvic organs were made. Left suprapubic incision was made after infiltrating with 0.25% Marcaine. Under direct visualization, suprapubic trocar was placed. Pelvis was inspected. Pictures were obtained. There was a small amount of vesicular-looking endometriosis in the cul-de-sac. This was cauterized using a micro Martin laparoscopic unit. There were some adhesions of the cecum to the right sidewall. These adhesions were brought down gently through the [facility] unit. It was felt that the right ovary was stuck beneath adhesions to the sidewall, and there would be no benefit to dissecting these free because of the extreme engorgement of the vasculature to the right adnexa. It is unlikely that this could be taken out through the laparoscope in the future. No other endometriosis could be seen throughout the pelvis or on the left adnexa. The appendix was attempted to be visualized, however, it was felt to be retrocecal and could not be visualized at this time. There were no major adhesions present. After the entire pelvis was irrigated and pictures were obtained and the upper abdomen had been visualized and felt to be normal, all carbon dioxide was allowed to escape. The subumbilical sleeve with the laparoscope was removed under direct visualization. The suprapubic sleeve was removed after all carbon dioxide  had escaped. The subumbilical incision was then closed deeply with O Vicryl stitch x 1. The fascia was too deep to actually close the fascia, however. Skin incisions were then closed with 4-0 Monocryl in subcuticular stitches. Band-Aids were placed over the wound. The Hulka tenaculum was removed from the vagina. The patient was taken out of dorsal lithotomy position. She was awakened. She was extubated. She was moved from the operating table to a stretcher in stable condition.  COMPLICATIONS:                None.  ESTIMATED BLOOD LOSS:         5 cc. Dictated by:   Sherry A. Rosalio Macadamia, M.D. Attending Physician:  Morene Antu DD:  12/24/00 TD:  12/24/00 Job: [id number]

## 2010-08-31 NOTE — Op Note (Signed)
NAME:  Carolyn [name], Carolyn [name]                         ACCOUNT NO.:  [id number]   MEDICAL RECORD NO.:  [id number]                   PATIENT TYPE:  OBV   LOCATION:  9399                                 FACILITY:  [facility]   PHYSICIAN:  Sherry A. Rosalio Macadamia, M.D.           DATE OF BIRTH:  Mar 28, 1961   DATE OF PROCEDURE:  DATE OF DISCHARGE:                                 OPERATIVE REPORT   PREOPERATIVE DIAGNOSES:  1. Pelvic pain.  2. Endometriosis.  3. Fibroid uterus.   POSTOPERATIVE DIAGNOSES:  1. Pelvic pain.  2. Endometriosis.  3. Fibroid uterus.  4. Pelvic congestion syndrome.   SURGEONS:  Sherry A. Rosalio Macadamia, M.D., Chester Holstein. Earlene Plater, M.D.   ANESTHESIA:  General.   INDICATIONS:  This is a 50 year old G2, P2, 0-0-2 woman who has had severe  pelvic pain, especially dysmenorrhea for several years. The patient  underwent a tubal ligation in 1994, at which time she was noted to have  engorged pelvic vessels and some mild endometriosis. She  had a repeat  laparoscopy in September 2002, with cautery of  endometriosis. Because of  the persistent pain the patient experienced, the patient was placed on  Lupron to shut down her menstrual periods. She was on Lupron for  approximately 11 cycles, and the time she was on Lupron had been extended  because of a broken leg; this needed to heal before she could have her  surgery. The patient requests removal of her uterus and removal of her right  tube and ovary which causes the most pain for her.   FINDINGS:  A 59 week sized anteflexed globular uterus, normal ovaries. The  right ovary with adhesion to the pelvic sidewall, right pelvic congestion  with very enlarged, engorged vasculature in the infundibular ligament.  Normal appendix.   DESCRIPTION OF PROCEDURE:  The patient was brought into the operating room  and given adequate general anesthesia. She was placed in the dorsal  lithotomy position. Her abdomen and then vagina were washed with  Hibiclens.  A Foley catheter was placed in the bladder. A speculum was placed into the  vagina. The cervix was grasped with a single-toothed Hulka tenaculum. The  speculum was removed. The surgeon's gown and gloves were changed. The  patient was  draped in a sterile fashion. The subumbilical area was  infiltrated with 0.25% Marcaine. An incision was made. The incision was  brought down to the fascia. The fascia was grasped with Kocher clamps. The  fascia was elevated, it was incised. A 0 Vicryl pursestring stitch was taken  in the fascia. The peritoneum was identified and entered bluntly. A Hasson  trocar was introduced into the peritoneal cavity and cinched down with the 0  Vicryl. The abdomen was insufflated with carbon dioxide.  Lateral incisions  were made after infiltrating with 0.25% Marcaine and trocars were placed  under direct visualization. The left round ligament was cauterized with  tripolar  cautery x 2 and incised. The left utero-ovarian ligaments were  cauterized and cut just to above the uterine arteries. The right round  ligament was cauterized and cut. The right infundibular ligament was  cauterized in three successive areas and cut in between down to the broad  ligament.  The anterior cul-de-sac bladder peritoneum was elevated. It was incised near  the cervix using a Nezhat; it was hydroinsufflated, hydrodissected. The  bladder peritoneum was then incised both ways. Small bleeders were  cauterized. The right cardinal ligaments were cauterized and cut just to the  uterine arteries. The vaginal procedure then followed.  A weighted speculum was placed within the vagina. The cervix was grasped  with two [place] tenaculums. The cervix was infiltrated with 1% Xylocaine  with epinephrine circumferentially. The cervix was circumcised. The vaginal  mucosa was dissected off of the cervix. The posterior cul-de-sac was entered  sharply. The uterosacral ligaments were clamped, cut  and suture ligated with  0 Vicryl ligatures. The bladder was developed off the lower uterine segment  with blunt dissection. The LigaSure was initially used, however, the first  LigaSure was not working properly. Several cardinal ligaments were clamped,  cut and suture ligated with 0 Vicryl on alternating sides. The LigaSure then  was then properly working. The remaining tissues were cauterized with the  LigaSure and cut.  The uterus was able to be delivered and removed from the vagina. There were  some small bleeders along the vaginal  mucosa. The mucosal edges were closed  with 0 Vicryl figure-of-eight stitches. A small amount of bleeding was felt  to be coming from the left pelvic sidewall and could not be identified  vaginally. This was felt to be minimal bleeding at this time, so the vagina  was closed. First posterior plication stitches were taken to prevent  enterocele x 2. This was tied. The vaginal mucosa was closed in the midline  in an anterior posterior position with 0 Vicryl figure-of-eight stitches.  The  uterosacral ligaments were tied in the midline. Adequate hemostasis was  present. The weighted speculum was removed from the vagina. The surgeon's  gloves were changed. The laparoscope was reintroduced into the peritoneal  cavity and insufflated. There were small amounts of bleeders along the left  side wall. These were very superficially cauterized with the tripolar. The  ureters  had been well visualized below any cautery and felt to be normal.  The pelvis was irrigated. Adequate hemostasis was felt to be present. The  upper abdomen was felt to be normal. The appendix was normal. All carbon  dioxide was allowed to escape. The lower ports were removed under direct  visualization. The Hasson sleeve was removed. All carbon dioxide had  escaped.  The fascia was closed with a 0 Vicryl pursestring stitch. The skin was closed with 4-0 Monocryl in a subcuticular running stitch. All  three  incisions were closed with Dermabond. Adequate hemostasis was felt to be  present. The patient was then taken out of  the dorsal lithotomy position.  She was awakened. She was moved from the operating table to the recovery  room in stable condition. There were no complications. Estimated blood loss  was 125 cc.                                               Sherry A. Rosalio Macadamia,  M.D.    SAD/MEDQ  D:  03/03/2002  T:  03/03/2002  Job:  [id number]

## 2010-08-31 NOTE — Op Note (Signed)
[facility]  Patient:    Carolyn [name], Carolyn [name] Visit Number: [id number] MRN: [id number]          Service Type: SUR Location: [id number] Attending Physician:  Burnard Bunting Dictated by:   Cammy Copa, M.D. Proc. Date: 08/13/01 Admit Date:  08/13/2001 Discharge Date: 08/16/2001                             Operative Report  PREOPERATIVE DIAGNOSIS:  Left tibia-fibula fracture.  POSTOPERATIVE DIAGNOSIS:  Left tibia-fibula fracture.  PROCEDURE:  Reamed intramedullary nailing of left tibia-fibula fracture.  SURGEON:  Cammy Copa, M.D.  ANESTHESIA:  General endotracheal.  ESTIMATED BLOOD LOSS:  50 cc.  DRAINS:  None.  IMPLANTS:  [employer]hew 8.5 mm x 34 cm nail with two proximal and two distal interlocks.  DESCRIPTION OF PROCEDURE:  The patient was brought to the operating room, where general endotracheal anesthesia was induced, preoperative IV antibiotics were administered, and the left leg was prepped with Duraprep solution and draped in a sterile manner.  Collier Flowers was used to cover the operative field around the knee.  The leg was placed on a triangle with the apex well-padded with towels.  The incision was made at the inferior pole of the patella and extended distally to the tibial tubercle.  The skin and subcutaneous tissue were sharply divided.  The patellar tendon was sharply divided, and a starting point was identified on the anterior superior aspect of the tibial plateau. An awl was used to create an entry point.  A guide pin was then placed.  The canal was then reamed to a distance of 5 cm with the opening reamer.  The fracture reduction tool was then placed across into the proximal aspect of the tibia.  Guide pin was then placed across the fracture site under direct visualization in the AP and lateral planes.  Following successful placement of the guide pin, the tibia was reamed to 10 mm.  An 8.5 mm intramedullary  nail was then tapped into position and was countersunk beneath the articular surface proximally and came within 2 cm of the tibial plafond.  Excellent fracture reduction was maintained in both the AP and lateral planes.  Correct rotation was also maintained during nail passage.  Two proximal interlocks and two distal interlocks were placed.  The fibula reduced nicely, and it was felt that additional fixation of the fibula would be unnecessary.  The ankle mortise was stable.  The knee ligaments were also stable as per preoperative examination.  Incisions were irrigated.  Distal and proximal interlock incisions were closed using interrupted, inverted 2-0 Vicryl and skin staples. The proximal incision through the patellar tendon was closed using interrupted, inverted 0 Vicryl to reapproximate the tendon, interrupted, inverted 2-0 Vicryl to reapproximate the skin edges, and skin staples to reapproximate the skin.  The patient had good perfusion to the foot at the conclusion of the case and was placed in a bulky Jones dressing.  She tolerated the procedure well without immediate complications. Dictated by:   Cammy Copa, M.D. Attending Physician:  Burnard Bunting DD:  08/13/01 TD:  08/15/01 Job: [id number]

## 2010-09-05 ENCOUNTER — Telehealth: Payer: Self-pay | Admitting: *Deleted

## 2010-09-05 NOTE — Telephone Encounter (Signed)
Pt was seen in the office and needs a call back regarding FMLA

## 2010-09-06 NOTE — Telephone Encounter (Signed)
Left mess to call office back w/details of what is needed.

## 2010-09-07 NOTE — Telephone Encounter (Signed)
LMOVM for pt to return call back to office.

## 2010-10-04 ENCOUNTER — Ambulatory Visit (INDEPENDENT_AMBULATORY_CARE_PROVIDER_SITE_OTHER): Payer: 59 | Admitting: Internal Medicine

## 2010-10-04 ENCOUNTER — Encounter: Payer: Self-pay | Admitting: Internal Medicine

## 2010-10-04 DIAGNOSIS — J309 Allergic rhinitis, unspecified: Secondary | ICD-10-CM

## 2010-10-05 NOTE — Progress Notes (Signed)
  Subjective:    Patient ID: Carolyn [name], female    DOB: 1960/09/14, 50 y.o.   MRN: [id number]  HPI Carolyn [name] presents with a 2 day history of headache locate in the frontal region. This is not like previous migraine headaches: no photophobia, N/V. She has no paresthesia. The pain is a gripping type pain and tightness. She has had no fever, no rhinorrhea, no cough or shortness of breath.  PMH, FamHx and SocHx reviewed for any changes and relevance.    Review of Systems  Constitutional: Positive for unexpected weight change.  Eyes: Negative.   Respiratory: Negative.   Cardiovascular: Negative.   Gastrointestinal: Negative.   Genitourinary: Negative.   Neurological: Positive for headaches. Negative for syncope, speech difficulty and numbness.  Hematological: Negative.   Psychiatric/Behavioral: Negative.        Objective:   Physical Exam Vitals noted Gen'l - WNWD AA woman in no acute distress HEENT - [place]/AT, C&ES clear, tender to percussion over the frontal and maxillary sinus. Pul - normal respirations Cor RRR Neuro - CN II-XII normal, MS normal, normal gait       Assessment & Plan:

## 2010-10-05 NOTE — Assessment & Plan Note (Signed)
Patient with a sensation of pressure in the area of the frontal sinus and on exam she is tender both frontal and maxillary sinus. No sign of infection and nor abnormal findings on neuro exam. Suspect allergic symptoms with sinus swelling and pressure.  Plan - trial of nasonex for relief of symptoms           Call back for lack of relief.

## 2010-10-10 ENCOUNTER — Telehealth: Payer: Self-pay | Admitting: *Deleted

## 2010-10-10 NOTE — Telephone Encounter (Signed)
Nasonex - no help? For fever or thick, purulent drainage - may have augmentin 875mg  bid x 7; nasal saline wash or nettie pot. If no fever - will need limited CT sinus

## 2010-10-10 NOTE — Telephone Encounter (Signed)
Left mess to call office back.   

## 2010-10-10 NOTE — Telephone Encounter (Signed)
Pt c/o increase in sinus h/a's. Sudafed and Claritin have not helped.

## 2010-10-11 ENCOUNTER — Ambulatory Visit (HOSPITAL_COMMUNITY)
Admission: RE | Admit: 2010-10-11 | Discharge: 2010-10-11 | Disposition: A | Discharge status: Home / Self Care | Payer: 59 | Source: Ambulatory Visit | Attending: Internal Medicine | Admitting: Internal Medicine

## 2010-10-11 ENCOUNTER — Encounter: Payer: Self-pay | Admitting: Internal Medicine

## 2010-10-11 ENCOUNTER — Ambulatory Visit (INDEPENDENT_AMBULATORY_CARE_PROVIDER_SITE_OTHER): Payer: 59 | Admitting: Internal Medicine

## 2010-10-11 VITALS — BP 130/82 | HR 85 | Temp 98.1°F | Wt 209.0 lb

## 2010-10-11 DIAGNOSIS — R51 Headache: Secondary | ICD-10-CM

## 2010-10-11 DIAGNOSIS — R519 Headache, unspecified: Secondary | ICD-10-CM

## 2010-10-11 DIAGNOSIS — J3489 Other specified disorders of nose and nasal sinuses: Secondary | ICD-10-CM | POA: Insufficient documentation

## 2010-10-11 MED ORDER — HYDROCODONE-ACETAMINOPHEN 5-325 MG PO TABS: 1.0000 | ORAL_TABLET | Freq: Four times a day (QID) | ORAL | Status: AC | PRN

## 2010-10-11 MED ORDER — AMOXICILLIN-POT CLAVULANATE 875-125 MG PO TABS: 1.0000 | ORAL_TABLET | Freq: Two times a day (BID) | ORAL | Status: AC

## 2010-10-11 NOTE — Telephone Encounter (Signed)
Spoke w/pt she is much worse since OV last week. Now has photosensitivity and "difficulty moving neck". Md agrees w/OV for re-eval - pt scheduled for apt this PM

## 2010-10-13 NOTE — Progress Notes (Signed)
  Subjective:    Patient ID: Carolyn [name], female    DOB: April 05, 1961, 50 y.o.   MRN: [id number]  HPI Carolyn [name] was recently seen for headache, thought to be allergic in nature. She has failed to improve with use of nasal steroid, saline wash and decongestant. In the interval she has had a little fever, a little rhinorrhea. She was asked to come in to be seen due to complaint of sore neck in addition to headache. She actually has had no limitation in ROM neck or other signs of meningismus.  PMH, FamHx and SocHx reviewed for any changes and relevance.   Review of Systems  Constitutional: Positive for fever. Negative for chills and activity change.  HENT: Positive for congestion, rhinorrhea, neck pain and postnasal drip. Negative for hearing loss, ear pain, nosebleeds, mouth sores, trouble swallowing, neck stiffness, dental problem and tinnitus.   Eyes: Negative.   Cardiovascular: Negative.   Gastrointestinal: Negative.   Neurological: Positive for headaches. Negative for dizziness, tremors, syncope and numbness.  Hematological: Negative.   Psychiatric/Behavioral: Negative.        Objective:   Physical Exam Vitals nboted - no fever Gen'l - overweight AA woman in no acute distress HEENT - tender to percussion over frontal and maxillary sinuses, TM's normal. Neck - full ROM: flexion, extension, rotation. Negative Kernig's maneuver. Pulmonary - normal respirations Cor - RRR       Assessment & Plan:  1. Headache - may have a component now of sinusitis. Tension headache less likely.  Plan - Rx Augmentin bid for 7 days           CT brain - evaluate for sinusititis vs intracranial abnormality as source of headache.           Hydrocodone for pain.           IF CT negative and patient does not espond to therapy will refer to Dr. Clarisse Gouge.   Addendum; *RADIOLOGY REPORT*  Clinical Data: Headache, sinus pressure  CT HEAD WITHOUT CONTRAST  Technique: Contiguous axial images were obtained  from the base of  the skull through the vertex without contrast.  Comparison: Head CT 02/23/2009  Findings: No acute intracranial hemorrhage. No focal mass lesion.  No CT evidence of acute infarction.  No midline shift or mass effect. No hydrocephalus.  Paranasal sinuses and mastoid air cells are clear.  IMPRESSION:  No acute intracranial findings. No evidence of sinusitis.  Original Report Authenticated By: Genevive Bi, M.D.

## 2010-10-14 ENCOUNTER — Telehealth: Payer: Self-pay | Admitting: Internal Medicine

## 2010-10-14 NOTE — Telephone Encounter (Signed)
Please call. CT brain is normal

## 2010-10-15 NOTE — Telephone Encounter (Signed)
lmovm for pt to call me back.  

## 2010-10-15 NOTE — Telephone Encounter (Signed)
Returned pts call. Waiting for her to call back

## 2010-10-15 NOTE — Telephone Encounter (Signed)
Informed pt .

## 2010-11-08 ENCOUNTER — Ambulatory Visit (INDEPENDENT_AMBULATORY_CARE_PROVIDER_SITE_OTHER): Payer: 59 | Admitting: Internal Medicine

## 2010-11-08 VITALS — BP 138/92 | HR 71 | Temp 98.0°F | Wt 209.0 lb

## 2010-11-08 DIAGNOSIS — R519 Headache, unspecified: Secondary | ICD-10-CM | POA: Insufficient documentation

## 2010-11-08 DIAGNOSIS — R51 Headache: Secondary | ICD-10-CM

## 2010-11-08 NOTE — Assessment & Plan Note (Signed)
Continued headache despite treatment for sinusitis and with a negative CT brain.  Plan - refer to Dr. Clarisse Gouge.

## 2010-11-08 NOTE — Progress Notes (Signed)
  Subjective:    Patient ID: Carolyn [name], female    DOB: Feb 14, 1961, 50 y.o.   MRN: [id number]  HPI patinet presents for persistent headache. She has had CT brain which was negative: no sinusitis or other abnormality. The patient continues to have daily headaches.  PMH, FamHx and SocHx reviewed for any changes and relevance.    Review of Systems     Objective:   Physical Exam Vitals stable. Neuro - non focal Cor - RRR Pul - normal respirations.        Assessment & Plan:

## 2010-11-12 ENCOUNTER — Telehealth: Payer: Self-pay | Admitting: *Deleted

## 2010-11-12 NOTE — Telephone Encounter (Signed)
Pt called and left dates for [facility]   6/21 & 22 6/27,28, & 29 7/23,24 & 25

## 2010-11-13 NOTE — Telephone Encounter (Signed)
Latoya, will you please help me with this. THNKS

## 2010-11-13 NOTE — Telephone Encounter (Signed)
Received a note today to add 6/12 also - Latoya, please let us know if we can help w/this - thanks

## 2010-12-03 ENCOUNTER — Other Ambulatory Visit: Payer: Self-pay | Admitting: Obstetrics and Gynecology

## 2010-12-03 ENCOUNTER — Other Ambulatory Visit: Payer: Self-pay | Admitting: Neurology

## 2010-12-03 DIAGNOSIS — Z1231 Encounter for screening mammogram for malignant neoplasm of breast: Secondary | ICD-10-CM

## 2010-12-05 ENCOUNTER — Ambulatory Visit
Admission: RE | Admit: 2010-12-05 | Discharge: 2010-12-05 | Disposition: A | Discharge status: Home / Self Care | Payer: 59 | Source: Ambulatory Visit | Attending: Neurology | Admitting: Neurology

## 2010-12-10 ENCOUNTER — Ambulatory Visit
Admission: RE | Admit: 2010-12-10 | Discharge: 2010-12-10 | Disposition: A | Discharge status: Home / Self Care | Payer: 59 | Source: Ambulatory Visit | Attending: Obstetrics and Gynecology | Admitting: Obstetrics and Gynecology

## 2010-12-10 ENCOUNTER — Telehealth: Payer: Self-pay | Admitting: *Deleted

## 2010-12-10 DIAGNOSIS — Z1231 Encounter for screening mammogram for malignant neoplasm of breast: Secondary | ICD-10-CM

## 2010-12-10 NOTE — Telephone Encounter (Signed)
Spoke w/pt - at ortho apt today pt's BP was 177/109. No symptoms - states she "always has a h/a". Also has been taking her BP meds as directed. Scheduled for nurse visit for BP check tomorrow PM.

## 2010-12-11 ENCOUNTER — Ambulatory Visit (INDEPENDENT_AMBULATORY_CARE_PROVIDER_SITE_OTHER): Payer: 59 | Admitting: *Deleted

## 2010-12-11 VITALS — BP 140/90

## 2010-12-11 DIAGNOSIS — I10 Essential (primary) hypertension: Secondary | ICD-10-CM

## 2010-12-12 ENCOUNTER — Telehealth: Payer: Self-pay | Admitting: *Deleted

## 2010-12-12 NOTE — Progress Notes (Signed)
Pt came in for BP check b/c at Ortho apt BP was elevated. She has home machine and readings have been elevated after that as well. Manual reading was 140/90 and pt's hm machine reading was 156/102. She will look into investing into new machine but will check bp daily w/another source and report readings. Pt has been taking her meds daily as directed w/no missed doses. She is also feeling "fine". MD aware of BP reading in telephone note.

## 2010-12-12 NOTE — Telephone Encounter (Signed)
Pt came in for BP check b/c at Ortho apt BP was elevated. She has home machine and readings have been elevated after that as well. Manual reading was 140/90 and pt's hm machine reading was 156/102. She will look into investing in new machine but will check bp daily w/another source and report back with readings. Pt has been taking her meds daily as directed w/no missed doses. She is also feeling "fine".   Any further suggestions?

## 2010-12-13 NOTE — Telephone Encounter (Signed)
No further sugfgestions. Will wait on feedback about home readings

## 2011-01-15 ENCOUNTER — Telehealth: Payer: Self-pay | Admitting: *Deleted

## 2011-01-15 ENCOUNTER — Ambulatory Visit (INDEPENDENT_AMBULATORY_CARE_PROVIDER_SITE_OTHER): Payer: 59 | Admitting: Internal Medicine

## 2011-01-15 VITALS — BP 146/100 | HR 97 | Temp 97.4°F | Wt 207.0 lb

## 2011-01-15 DIAGNOSIS — I10 Essential (primary) hypertension: Secondary | ICD-10-CM

## 2011-01-15 MED ORDER — NEBIVOLOL HCL 20 MG PO TABS: 20.0000 mg | ORAL_TABLET | Freq: Every day | ORAL | Status: DC

## 2011-01-15 NOTE — Telephone Encounter (Signed)
See ov note. 

## 2011-01-15 NOTE — Telephone Encounter (Signed)
Pt here today for BP check, on check today her BP was 148 100. What do you advise for pt?

## 2011-01-15 NOTE — Progress Notes (Signed)
  Subjective:    Patient ID: Carolyn [name], female    DOB: [birth year], 50 y.o.   MRN: [id number]  HPI  Nurse Visit today for bp check  Review of Systems     Objective:   Physical Exam        Assessment & Plan:

## 2011-01-17 NOTE — Progress Notes (Signed)
  Subjective:    Patient ID: Carolyn [name], female    DOB: Aug 03, 1960, 50 y.o.   MRN: [id number]  HPI Carolyn [name] came in for BP check - remains elevated despite multiple medications. She has been asymptomatic except for mild headache - no diplopia, epistaxis, chest pain or tachycardia.   I have reviewed the patient's medical history in detail and updated the computerized patient record.   Review of Systems System review is negative for any constitutional, cardiac, pulmonary, GI or neuro symptoms or complaints other than as described in the HPI.     Objective:   Physical Exam Vitals noted - elevated BP Gen'l - WNWD AA female in no distress Cor - RRR Neuro - non focal       Assessment & Plan:

## 2011-01-17 NOTE — Assessment & Plan Note (Signed)
Poor control on 4 drugs.  Plan - r/o RAS - Renal U/S with doppler           Increase bystolic to 20 mg daily           Continue all other medications.

## 2011-02-06 ENCOUNTER — Ambulatory Visit (INDEPENDENT_AMBULATORY_CARE_PROVIDER_SITE_OTHER): Payer: 59 | Admitting: Cardiology

## 2011-02-06 ENCOUNTER — Other Ambulatory Visit: Payer: Self-pay | Admitting: Cardiology

## 2011-02-06 DIAGNOSIS — I10 Essential (primary) hypertension: Secondary | ICD-10-CM

## 2011-02-11 ENCOUNTER — Telehealth: Payer: Self-pay | Admitting: Internal Medicine

## 2011-02-11 NOTE — Telephone Encounter (Signed)
Please call patient - renal artery doppler - normal blood flow, no blockage in renal arteries.

## 2011-02-11 NOTE — Telephone Encounter (Signed)
Informed pt of results.

## 2011-02-12 ENCOUNTER — Other Ambulatory Visit: Payer: Self-pay | Admitting: Internal Medicine

## 2011-04-25 ENCOUNTER — Telehealth: Payer: Self-pay | Admitting: *Deleted

## 2011-04-25 MED ORDER — LOSARTAN POTASSIUM-HCTZ 100-25 MG PO TABS: 1.0000 | ORAL_TABLET | Freq: Every day | ORAL | Status: DC

## 2011-04-25 NOTE — Telephone Encounter (Signed)
Refill request for Losartan/HCTZ 100-25mg .

## 2011-04-26 ENCOUNTER — Encounter: Payer: Self-pay | Admitting: Endocrinology

## 2011-04-26 ENCOUNTER — Ambulatory Visit (INDEPENDENT_AMBULATORY_CARE_PROVIDER_SITE_OTHER): Payer: 59 | Admitting: Endocrinology

## 2011-04-26 VITALS — BP 136/82 | HR 73 | Temp 98.3°F

## 2011-04-26 DIAGNOSIS — H109 Unspecified conjunctivitis: Secondary | ICD-10-CM

## 2011-04-26 MED ORDER — AZELASTINE HCL 0.05 % OP SOLN: 2.0000 [drp] | Freq: Two times a day (BID) | OPHTHALMIC | Status: DC | PRN

## 2011-04-26 NOTE — Patient Instructions (Signed)
i have sent a prescription to your pharmacy, for eye drops. I hope you feel better soon.  If you don't feel better by next week, please call dr Debby Bud.

## 2011-04-26 NOTE — Progress Notes (Signed)
Subjective:    Patient ID: Carolyn [name], female    DOB: 1960-09-26, 51 y.o.   MRN: [id number]  HPI Pt states few days of moderate irritation of the left eye, and assoc erythema.  There sxs are improved somewhat, but the eye has a "gritty" sensation.   Past Medical History  Diagnosis Date  . Hypertension   . Diabetes mellitus     diet controlled    Past Surgical History  Procedure Date  . Abdominal hysterectomy     left oopherectomy  . Orif tibia & fibula fractures     Left    History   Social History  . Marital Status: Married    Spouse Name: N/A    Number of Children: 2  . Years of Education: N/A   Occupational History  . [employer]   Social History Main Topics  . Smoking status: Never Smoker   . Smokeless tobacco: Not on file  . Alcohol Use: Not on file  . Drug Use: Not on file  . Sexually Active: Not on file   Other Topics Concern  . Not on file   Social History Narrative   [employer] 1 yearMarried '892 boys-'93, '94Marriage-good health    Current Outpatient Prescriptions on File Prior to Visit  Medication Sig Dispense Refill  . amlodipine-atorvastatin (CADUET) 10-20 MG per tablet TAKE ONE TABLET BY MOUTH EVERY DAY  90 tablet  1  . Cholecalciferol (VITAMIN D3) 1000 UNITS CAPS Take by mouth daily.        Marland Kitchen esomeprazole (NEXIUM) 40 MG capsule Take 1 capsule (40 mg total) by mouth daily.  30 capsule  11  . losartan-hydrochlorothiazide (HYZAAR) 100-25 MG per tablet Take 1 tablet by mouth daily.  90 tablet  3  . meloxicam (MOBIC) 7.5 MG tablet Take 1 tablet (7.5 mg total) by mouth daily.  30 tablet  2  . nabumetone (RELAFEN) 750 MG tablet Take 750 mg by mouth 2 (two) times daily.        . Nebivolol HCl (BYSTOLIC) 20 MG TABS Take 1 tablet (20 mg total) by mouth daily.  30 tablet  11  . nortriptyline (PAMELOR) 10 MG capsule Take 10 mg by mouth 5 (five) times daily.        . ranitidine (ZANTAC) 150 MG tablet Take 150 mg by mouth 2 (two) times daily.         . promethazine (PHENERGAN) 25 MG suppository Place 25 mg rectally every 6 (six) hours as needed.          Allergies  Allergen Reactions  . Codeine     Family History  Problem Relation Age of Onset  . Alzheimer's disease Mother   . Hypertension Mother   . Emphysema Father   . Cancer Sister     Breast Cancer  . Diabetes Sister   . Emphysema Sister     BP 136/82  Pulse 73  Temp(Src) 98.3 F (36.8 C) (Oral)  SpO2 97%  Review of Systems Denies visual loss, but she has nausea.      Objective:   Physical Exam VITAL SIGNS:  See vs page GENERAL: no distress head: no deformity eyes: no periorbital swelling, no proptosis.  There is minimal left conjunctival injection. external nose and ears are normal mouth: no lesion seen Optic fundi are normal   NECK: There is no palpable thyroid enlargement.  No thyroid nodule is palpable.  No palpable lymphadenopathy at the anterior neck.  Assessment & Plan:  Acute conjunctivitis, improved.  new

## 2011-05-21 ENCOUNTER — Encounter: Payer: Self-pay | Admitting: Internal Medicine

## 2011-05-21 ENCOUNTER — Ambulatory Visit (INDEPENDENT_AMBULATORY_CARE_PROVIDER_SITE_OTHER): Payer: 59 | Admitting: Internal Medicine

## 2011-05-21 ENCOUNTER — Telehealth: Payer: Self-pay | Admitting: Internal Medicine

## 2011-05-21 VITALS — BP 120/78 | HR 96 | Temp 98.6°F | Resp 16 | Wt 210.0 lb

## 2011-05-21 DIAGNOSIS — J069 Acute upper respiratory infection, unspecified: Secondary | ICD-10-CM

## 2011-05-21 MED ORDER — PROMETHAZINE-CODEINE 6.25-10 MG/5ML PO SYRP: 5.0000 mL | ORAL_SOLUTION | ORAL | Status: AC | PRN

## 2011-05-21 NOTE — Progress Notes (Signed)
  Subjective:    Patient ID: Carolyn [name], female    DOB: [birth year], 51 y.o.   MRN: [id number]  HPI Carolyn [name] presents for constant sinus drainage and congestion. When she coughs she is producing clear mucus. She had a fever last night of 101. She does ache all over. She is feeling tired and has not been able to sleep due to congestion. She has been having nausea. No joint swelling, no lymph nodes. She has been short of breath and has DOE.  Past Medical History  Diagnosis Date  . Hypertension   . Diabetes mellitus     diet controlled   Past Surgical History  Procedure Date  . Abdominal hysterectomy     left oopherectomy  . Orif tibia & fibula fractures     Left   Family History  Problem Relation Age of Onset  . Carolyn [name]   . Hypertension [name]   . Emphysema Father   . Cancer Sister     Breast Cancer  . Diabetes Sister   . Emphysema Sister    History   Social History  . Marital Status: Married    Spouse Name: Carolyn [name]    Number of Children: 2  . Years of Education: Carolyn [name]   Occupational History  . [employer]   Social History Main Topics  . Smoking status: Never Smoker   . Smokeless tobacco: Not on file  . Alcohol Use: Not on file  . Drug Use: Not on file  . Sexually Active: Not on file   Other Topics Concern  . Not on file   Social History Narrative   [employer] 1 yearMarried '892 boys-'93, '94Marriage-good health       Review of Systems System review is negative for any constitutional, cardiac, pulmonary, GI or neuro symptoms or complaints other than as described in the HPI.     Objective:   Physical Exam Filed Vitals:   05/21/11 1607  BP: 120/78  Pulse: 96  Temp: 98.6 F (37 C)  Resp: 16  Weight: 210 lb (95.255 kg)  Gen'l - WNWD AA woman in no acute distress HEENT- C&S clear, EACs clear, TMs normal, throat clear without erythema or exudate. Very tender to percussion over the frontal and maxillary sinus Nodes -  none Pulm - normal respirations, no rales or wheezes. Cor - RRR Abd - BS+ , diffuse mild tenderness Neuro - non-focal        Assessment & Plan:

## 2011-05-21 NOTE — Telephone Encounter (Signed)
To add to PM schedule

## 2011-05-21 NOTE — Telephone Encounter (Signed)
Scheduled OV 02.05.13 @ 3:30pm

## 2011-05-21 NOTE — Patient Instructions (Signed)
Viral upper respiratory infection. Plan - loratadine 10 mg once a day - an antihistamine to dry up the mucus; sudafed 30 mg three times a day for congestion; Promethazine with codeine 1 or 2 tsp at bedtime, may take during the day. Hydrate!!   Upper Respiratory Infection, Adult An upper respiratory infection (URI) is also sometimes known as the common cold. The upper respiratory tract includes the nose, sinuses, throat, trachea, and bronchi. Bronchi are the airways leading to the lungs. Most people improve within 1 week, but symptoms can last up to 2 weeks. A residual cough may last even longer.   CAUSES Many different viruses can infect the tissues lining the upper respiratory tract. The tissues become irritated and inflamed and often become very moist. Mucus production is also common. A cold is contagious. You can easily spread the virus to others by oral contact. This includes kissing, sharing a glass, coughing, or sneezing. Touching your mouth or nose and then touching a surface, which is then touched by another person, can also spread the virus. SYMPTOMS   Symptoms typically develop 1 to 3 days after you come in contact with a cold virus. Symptoms vary from person to person. They may include:  Runny nose.     Sneezing.    Nasal congestion.     Sinus irritation.     Sore throat.     Loss of voice (laryngitis).     Cough.    Fatigue.    Muscle aches.     Loss of appetite.     Headache.    Low-grade fever.  DIAGNOSIS   You might diagnose your own cold based on familiar symptoms, since most people get a cold 2 to 3 times a year. Your caregiver can confirm this based on your exam. Most importantly, your caregiver can check that your symptoms are not due to another disease such as strep throat, sinusitis, pneumonia, asthma, or epiglottitis. Blood tests, throat tests, and X-rays are not necessary to diagnose a common cold, but they may sometimes be helpful in excluding other more  serious diseases. Your caregiver will decide if any further tests are required. RISKS AND COMPLICATIONS   You may be at risk for a more severe case of the common cold if you smoke cigarettes, have chronic heart disease (such as heart failure) or lung disease (such as asthma), or if you have a weakened immune system. The very young and very old are also at risk for more serious infections. Bacterial sinusitis, middle ear infections, and bacterial pneumonia can complicate the common cold. The common cold can worsen asthma and chronic obstructive pulmonary disease (COPD). Sometimes, these complications can require emergency medical care and may be life-threatening. PREVENTION   The best way to protect against getting a cold is to practice good hygiene. Avoid oral or hand contact with people with cold symptoms. Wash your hands often if contact occurs. There is no clear evidence that vitamin C, vitamin E, echinacea, or exercise reduces the chance of developing a cold. However, it is always recommended to get plenty of rest and practice good nutrition. TREATMENT   Treatment is directed at relieving symptoms. There is no cure. Antibiotics are not effective, because the infection is caused by a virus, not by bacteria. Treatment may include:  Increased fluid intake. Sports drinks offer valuable electrolytes, sugars, and fluids.     Breathing heated mist or steam (vaporizer or shower).     Eating chicken soup or other clear broths, and  maintaining good nutrition.     Getting plenty of rest.     Using gargles or lozenges for comfort.     Controlling fevers with ibuprofen or acetaminophen as directed by your caregiver.     Increasing usage of your inhaler if you have asthma.  Zinc gel and zinc lozenges, taken in the first 24 hours of the common cold, can shorten the duration and lessen the severity of symptoms. Pain medicines may help with fever, muscle aches, and throat pain. A variety of non-prescription  medicines are available to treat congestion and runny nose. Your caregiver can make recommendations and may suggest nasal or lung inhalers for other symptoms.   HOME CARE INSTRUCTIONS    Only take over-the-counter or prescription medicines for pain, discomfort, or fever as directed by your caregiver.     Use a warm mist humidifier or inhale steam from a shower to increase air moisture. This may keep secretions moist and make it easier to breathe.     Drink enough water and fluids to keep your urine clear or pale yellow.     Rest as needed.     Return to work when your temperature has returned to normal or as your caregiver advises. You may need to stay home longer to avoid infecting others. You can also use a face mask and careful hand washing to prevent spread of the virus.  SEEK MEDICAL CARE IF:    After the first few days, you feel you are getting worse rather than better.     You need your caregiver's advice about medicines to control symptoms.     You develop chills, worsening shortness of breath, or brown or red sputum. These may be signs of pneumonia.     You develop yellow or brown nasal discharge or pain in the face, especially when you bend forward. These may be signs of sinusitis.     You develop a fever, swollen neck glands, pain with swallowing, or white areas in the back of your throat. These may be signs of strep throat.  SEEK IMMEDIATE MEDICAL CARE IF:    You have a fever.     You develop severe or persistent headache, ear pain, sinus pain, or chest pain.     You develop wheezing, a prolonged cough, cough up blood, or have a change in your usual mucus (if you have chronic lung disease).     You develop sore muscles or a stiff neck.  Document Released: 09/25/2000 Document Revised: 12/12/2010 Document Reviewed: 08/03/2010 [facility] Patient Information 2012 [place], [place].

## 2011-05-21 NOTE — Telephone Encounter (Signed)
The pt called and is hoping to get worked in today for cough and congestion.  She was offered an appointment for tomorrow with a different dr, but has refused.  Please advise if she can be worked in today.  Thanks!

## 2011-05-23 NOTE — Assessment & Plan Note (Signed)
Viral URI. No indication for antibiotics Plan - supportive care: antihistamine, decongestant, comfort measures.

## 2011-07-15 ENCOUNTER — Other Ambulatory Visit: Payer: Self-pay | Admitting: Internal Medicine

## 2011-07-15 NOTE — Telephone Encounter (Signed)
Why does Carolyn [name] need a refill on antibiotic? Need more documentation as to reason.

## 2011-07-18 ENCOUNTER — Ambulatory Visit (INDEPENDENT_AMBULATORY_CARE_PROVIDER_SITE_OTHER): Payer: 59 | Admitting: Internal Medicine

## 2011-07-18 ENCOUNTER — Encounter: Payer: Self-pay | Admitting: Internal Medicine

## 2011-07-18 VITALS — BP 130/92 | HR 95 | Temp 98.4°F | Resp 16 | Ht 64.0 in | Wt 205.0 lb

## 2011-07-18 DIAGNOSIS — R05 Cough: Secondary | ICD-10-CM

## 2011-07-18 DIAGNOSIS — N760 Acute vaginitis: Secondary | ICD-10-CM

## 2011-07-18 DIAGNOSIS — J309 Allergic rhinitis, unspecified: Secondary | ICD-10-CM

## 2011-07-18 DIAGNOSIS — J019 Acute sinusitis, unspecified: Secondary | ICD-10-CM

## 2011-07-18 MED ORDER — FLUCONAZOLE 150 MG PO TABS: 150.0000 mg | ORAL_TABLET | Freq: Once | ORAL | Status: AC

## 2011-07-18 MED ORDER — AMOXICILLIN-POT CLAVULANATE 875-125 MG PO TABS: 1.0000 | ORAL_TABLET | Freq: Two times a day (BID) | ORAL | Status: AC

## 2011-07-18 MED ORDER — BENZONATATE 200 MG PO CAPS: ORAL_CAPSULE | ORAL | Status: DC

## 2011-07-18 MED ORDER — TRAMADOL HCL 50 MG PO TABS: ORAL_TABLET | ORAL | Status: DC

## 2011-07-18 NOTE — Progress Notes (Signed)
  Subjective:    Patient ID: Carolyn [name], female    DOB: Jul 03, 1960, 51 y.o.   MRN: [id number]  HPI  ongoing "sick" for >8 weeks Initially URI, then diarrhea, then bronchitis [facility]en at [facility] mid 3/13> treated with zpak abx and steroid pak briefly improved, then resumed cough - symptoms worse at night and with exertion Denies associated fever or wheeze, no shortness of breath. Increasing sinus pressure below eyes with mild headache Minimally improved with over-the-counter medications, Mucinex D most effective  Past Medical History  Diagnosis Date  . Hypertension   . Diabetes mellitus     diet controlled     Review of Systems  Constitutional: Positive for fatigue. Negative for fever.  Respiratory: Positive for cough. Negative for shortness of breath and wheezing.   Cardiovascular: Negative for chest pain.  Genitourinary: Positive for dysuria. Negative for vaginal discharge and vaginal pain.       Objective:   Physical Exam BP 130/92  Pulse 95  Temp(Src) 98.4 F (36.9 C) (Oral)  Resp 16  Ht 5\' 4"  (1.626 m)  Wt 205 lb (92.987 kg)  BMI 35.19 kg/m2  SpO2 94%  GEN: No acute distress. Fatigued with occasional deep cough but nontoxic HEENT: Sinuses mildly tender bilaterally over maxillary region. Nares with thick discharge, no evidence of polyps or septal deviation. TMs hazy bilaterally but no effusion or erythema. No cerumen. Oropharynx red with PND, no exudate Neck: Mild lymphadenopathy, shotty. Full range of motion and no thyromegaly Lungs: No increased work of breathing. Clear to auscultation without wheeze or crackle Cardiovascular: RRR. No edema GU: Deferred  Lab Results  Component Value Date   WBC 9.7 06/12/2010   HGB 11.4* 06/12/2010   HCT 33.8* 06/12/2010   PLT 319.0 06/12/2010   GLUCOSE 123* 06/12/2010   ALT 37* 02/23/2009   AST 29 02/23/2009   NA 137 06/12/2010   K 4.0 06/12/2010   CL 100 06/12/2010   CREATININE 0.6 06/12/2010   BUN 12 06/12/2010   CO2 27 06/12/2010        Assessment & Plan:  Sinusitis, maxillary Cough, status post bronchitis 3 weeks ago allergic rhinitis  vaginitis vs UTI  Augmentin x 7d - amox will cover sinus infx as well as poss UTI Tessalon + tramadol for cough Continue mucinex D and claritin Diflucan after abx complete

## 2011-07-18 NOTE — Patient Instructions (Signed)
It was good to see you today. We have reviewed your prior records including labs and tests today Augmentin antibiotics 2x/day x 1 week Also Tessalon 2x/day x 2 weeks + tramadol 2x/day x 2 weeks -- then as needed for cough Continue Mucinex D 2x/day x 1 week, and claritin daily for next 30 days (in place of zyrtec) Alternate between ibuprofen and tylenol for aches, pain and fever symptoms as discussed Diflucan to take for vaginitis symptoms after done with antibiotics  Your prescription(s) have been submitted to your pharmacy. Please take as directed and contact our office if you believe you are having problem(s) with the medication(s).

## 2011-07-21 ENCOUNTER — Other Ambulatory Visit: Payer: Self-pay | Admitting: Internal Medicine

## 2011-08-17 ENCOUNTER — Other Ambulatory Visit: Payer: Self-pay | Admitting: Internal Medicine

## 2011-09-12 ENCOUNTER — Other Ambulatory Visit: Payer: Self-pay | Admitting: Internal Medicine

## 2011-10-07 ENCOUNTER — Telehealth: Payer: Self-pay | Admitting: Internal Medicine

## 2011-10-07 NOTE — Telephone Encounter (Signed)
Caller: Carolyn [name]/Patient; PCP: Illene Regulus; ;  Call regarding Recently Out of Work and [employer]. She Is About To Run Out of BP Medication. She Takes Catawick, Diatolic and Losartin HCTZ - Only Has One Pill of Catawick and 4. Pills of Losartin and Diastolic Left. She also takes Nexium daily and  has 7 pills; She will be going on [employer] plan even though it is not enrollement time/ she should be able to get on since it is "life event". She is wondering if there are any samples or generic forms of her medications that can be called in- SHE ONLY NEEDS 2-3 WEEK SUPPLY. IF SHE DOES NOT HAVE BP MEDS SHE HAS SWELLING. PLEASE CALL HER  CB#: [phone].

## 2011-10-07 NOTE — Telephone Encounter (Signed)
Samples provided for Benicar 40 mg. Pt is requesting advisement on cost effective alternatives to other medications, please advise.

## 2011-10-07 NOTE — Telephone Encounter (Signed)
Can work in for an office visit this week to review all meds and find least costly regimen.

## 2011-10-08 NOTE — Telephone Encounter (Signed)
lvmom for pt to call back to schedule apt

## 2011-10-09 NOTE — Telephone Encounter (Signed)
Patient/Shakeerah calling in followup regarding medication; states had requested samples or refill of medications as listed in previous message, but cheaper alternatives.  States not on insurance currently and cannot afford an office visit.  Info to office for staff/provider review/Rx/callback. MAY REACH PATIENT AT [phone].

## 2011-10-10 ENCOUNTER — Telehealth: Payer: Self-pay | Admitting: Internal Medicine

## 2011-10-10 NOTE — Telephone Encounter (Signed)
Caller: Taeko/Patient is calling with a question about Nexium, Bystolic, Losartan, Caduet. The medications were written by Illene Regulus. Reports left multiple messages r/t request for drug samples or refill without an office visit d/t no current insurance.  Per Epic, advised Dr Debby Bud responded 10/07/11 at 5:43 PM " Can work in for an office visit this week to review all meds and find least costly regimen."  Suggested appt be scheduled but pt has no income to pay for visit. Ran out of Caduet 10/09/11. Has 5 Losartin remaining, 7 Nexium and 9 Bistolic.  Has already worked out office payment plan for overdue balance.  Advised to schedule appt with MD for discussion of meds with MD.  Appt scheduled for 10/11/11 at 0900 with Dr Felicity Coyer.  Advised to call pharmacy for emergency supply of Caduet now so will get dose for today.  Info noted and sent to office for unable to obtain RX med r/t available resources and situation poses immediate clinical risk per Med Question Call Guideline.

## 2011-10-11 ENCOUNTER — Ambulatory Visit (INDEPENDENT_AMBULATORY_CARE_PROVIDER_SITE_OTHER): Payer: Self-pay | Admitting: Internal Medicine

## 2011-10-11 ENCOUNTER — Encounter: Payer: Self-pay | Admitting: Internal Medicine

## 2011-10-11 VITALS — BP 130/82 | HR 75 | Temp 98.4°F | Ht 64.75 in | Wt 203.1 lb

## 2011-10-11 DIAGNOSIS — I10 Essential (primary) hypertension: Secondary | ICD-10-CM

## 2011-10-11 DIAGNOSIS — E785 Hyperlipidemia, unspecified: Secondary | ICD-10-CM | POA: Insufficient documentation

## 2011-10-11 DIAGNOSIS — K219 Gastro-esophageal reflux disease without esophagitis: Secondary | ICD-10-CM | POA: Insufficient documentation

## 2011-10-11 MED ORDER — FLUCONAZOLE 150 MG PO TABS: 150.0000 mg | ORAL_TABLET | Freq: Once | ORAL | Status: AC

## 2011-10-11 MED ORDER — SIMVASTATIN 40 MG PO TABS: 40.0000 mg | ORAL_TABLET | Freq: Every day | ORAL | Status: DC

## 2011-10-11 MED ORDER — ATENOLOL 50 MG PO TABS: 50.0000 mg | ORAL_TABLET | Freq: Every day | ORAL | Status: DC

## 2011-10-11 MED ORDER — LOSARTAN POTASSIUM 100 MG PO TABS: 100.0000 mg | ORAL_TABLET | Freq: Every day | ORAL | Status: DC

## 2011-10-11 MED ORDER — OMEPRAZOLE 20 MG PO CPDR: 20.0000 mg | DELAYED_RELEASE_CAPSULE | Freq: Every day | ORAL | Status: DC

## 2011-10-11 MED ORDER — HYDROCHLOROTHIAZIDE 25 MG PO TABS: 25.0000 mg | ORAL_TABLET | Freq: Every day | ORAL | Status: DC

## 2011-10-11 NOTE — Addendum Note (Signed)
Addended by: Rene Paci A on: 10/11/2011 09:57 AM   Modules accepted: Orders

## 2011-10-11 NOTE — Progress Notes (Signed)
  Subjective:    Patient ID: Carolyn [name], female    DOB: 21-Feb-1961, 51 y.o.   MRN: [id number]  HPI  Here for follow up - reviewed need for alternative meds due to $$  Past Medical History  Diagnosis Date  . Hypertension   . Diabetes mellitus     diet controlled  . GERD (gastroesophageal reflux disease)   . Dyslipidemia     Review of Systems  Respiratory: Negative for cough and shortness of breath.   Cardiovascular: Positive for leg swelling (dep edema, mild BLE). Negative for chest pain.       Objective:   Physical Exam BP 130/82  Pulse 75  Temp 98.4 F (36.9 C) (Oral)  Ht 5' 4.75" (1.645 m)  Wt 203 lb 1.9 oz (92.135 kg)  BMI 34.06 kg/m2  SpO2 96% Wt Readings from Last 3 Encounters:  10/11/11 203 lb 1.9 oz (92.135 kg)  07/18/11 205 lb (92.987 kg)  05/21/11 210 lb (95.255 kg)   Constitutional: She appears well-developed and well-nourished. No distress.   Cardiovascular: Normal rate, regular rhythm and normal heart sounds.  No murmur heard. trace BLE edema. Pulmonary/Chest: Effort normal and breath sounds normal. No respiratory distress. She has no wheezes.  Neurological: She is alert and oriented to person, place, and time. No cranial nerve deficit. Coordination normal.  Psychiatric: She has a normal mood and affect. Her behavior is normal. Judgment and thought content normal.   Lab Results  Component Value Date   WBC 9.7 06/12/2010   HGB 11.4* 06/12/2010   HCT 33.8* 06/12/2010   PLT 319.0 06/12/2010   GLUCOSE 123* 06/12/2010   ALT 37* 02/23/2009   AST 29 02/23/2009   NA 137 06/12/2010   K 4.0 06/12/2010   CL 100 06/12/2010   CREATININE 0.6 06/12/2010   BUN 12 06/12/2010   CO2 27 06/12/2010       Assessment & Plan:  See problem list. Medications and labs reviewed today.

## 2011-10-11 NOTE — Patient Instructions (Signed)
It was good to see you today. Medications reviewed, all changed to generics at this time. Your prescription(s) have been submitted to your pharmacy. Please take as directed and contact our office if you believe you are having problem(s) with the medication(s). Monitor blood pressure and call if over 140/85 Please schedule followup in 3-4 months to review/recheck, call sooner if problems.

## 2011-10-11 NOTE — Assessment & Plan Note (Signed)
Change nexium to generic PPI as H2B ineffective

## 2011-10-11 NOTE — Assessment & Plan Note (Signed)
Stop amlodipine due to edema at 10mg  dose Change others to generic options Pt asked to monitor at home given reduction on #/meds BP Readings from Last 3 Encounters:  10/11/11 130/82  07/18/11 130/92  05/21/11 120/78

## 2011-10-11 NOTE — Assessment & Plan Note (Signed)
Change atorva (in caudet combo) to generic statin for $

## 2011-11-25 ENCOUNTER — Telehealth: Payer: Self-pay | Admitting: Internal Medicine

## 2011-11-25 NOTE — Telephone Encounter (Signed)
Cannot manage w/o office visit. Can see Tuesday or Wednesday, will try to keep charge low.

## 2011-11-25 NOTE — Telephone Encounter (Signed)
Caller: Eurika/Patient; Patient Name: Carolyn [name]; PCP: Rene Paci; Best Callback Phone Number: [phone]    Has history of HBP, Dr Felicity Coyer changed her medication in June 2013 due to expensive medicines and loss of job and insurance.  Noting that blood pressure has been increasing since then.  Readings were usually  132 /89.  Ate meal heavy in sodium on Thurs 8/8 then blood pressure started to climb.   Readings over weekend were 158/102,  196/104,  178/96.  Could not catch breath, very dizzy and had to sleep a lot on Sat 8/10 and Sun 8/11.  Has noticed urine with bad odor early am over the weekend.  Has been drinking 4 bottles of water per day.   Had garlic and parsley - put in water for tea -  as treatment on Sat and Sun nights.  Only slight headache today.  BP 162/94 this morning.  Feeling will need to change blood pressure medicines again.  Triaged in Hypertension Guideline - Disposition:  See Provider Within 24 Hours due to elevation of blood pressure after medication change.  Declines appointment due to being out of work, no insurance.  Asking for guidance on next step.   PLEASE REVIEW AND CONTACT PATIENT AT  [phone].

## 2011-11-26 NOTE — Telephone Encounter (Signed)
Appt at 10:30 Wed.

## 2011-11-27 ENCOUNTER — Ambulatory Visit: Payer: Self-pay | Admitting: Internal Medicine

## 2012-10-15 ENCOUNTER — Other Ambulatory Visit: Payer: Self-pay | Admitting: Internal Medicine

## 2012-10-19 ENCOUNTER — Other Ambulatory Visit: Payer: Self-pay | Admitting: Internal Medicine

## 2012-10-21 ENCOUNTER — Telehealth: Payer: Self-pay

## 2012-10-21 NOTE — Telephone Encounter (Signed)
Phone call from patient requesting samples of Prilosec. Samples were placed at the front desk.

## 2012-10-22 ENCOUNTER — Telehealth: Payer: Self-pay | Admitting: *Deleted

## 2012-10-22 NOTE — Telephone Encounter (Signed)
R'cd fax from [facility] for PA on Omeprazole. PA form completed faxed back to [facility]-awaiting response.

## 2012-10-28 NOTE — Telephone Encounter (Signed)
Checked on status of PA-pending review per [facility], additional clinical information given to representative over phone. Will receive fax with decision per rep-Case ID: [id number]

## 2012-10-30 NOTE — Telephone Encounter (Signed)
Closing phone note until information is received back from [facility].

## 2012-11-04 ENCOUNTER — Other Ambulatory Visit: Payer: Self-pay | Admitting: Internal Medicine

## 2012-11-04 MED ORDER — PANTOPRAZOLE SODIUM 40 MG PO TBEC: 40.0000 mg | DELAYED_RELEASE_TABLET | Freq: Every day | ORAL | Status: DC

## 2012-11-05 ENCOUNTER — Telehealth: Payer: Self-pay | Admitting: Internal Medicine

## 2012-11-05 ENCOUNTER — Telehealth: Payer: Self-pay | Admitting: *Deleted

## 2012-11-05 ENCOUNTER — Other Ambulatory Visit: Payer: Self-pay | Admitting: Internal Medicine

## 2012-11-05 MED ORDER — ESOMEPRAZOLE MAGNESIUM 40 MG PO PACK: 40.0000 mg | PACK | Freq: Every day | ORAL | Status: DC

## 2012-11-05 NOTE — Telephone Encounter (Signed)
No samples of Nexium in the office. What do you suggest? Please advise. Thank you.

## 2012-11-05 NOTE — Telephone Encounter (Signed)
otc prilosec 20 mg 2 every AM or otc Ranitidine 150 mg twice a day.

## 2012-11-05 NOTE — Telephone Encounter (Signed)
nexium called into pharmacy

## 2012-11-05 NOTE — Telephone Encounter (Signed)
Pt called the pharmacy.  The charge for Nexium is $200.00.  She wants samples or something until she can come in on Aug. 18. (see other phone note from today.)

## 2012-11-05 NOTE — Telephone Encounter (Signed)
Pt made aware

## 2012-11-05 NOTE — Telephone Encounter (Signed)
Pt's insurance denied both the omeprazole as well as pantoprazole. Spoke to pt & she states that in the past she has taken Nexium & her insurance will cover this med.

## 2012-11-06 NOTE — Telephone Encounter (Signed)
Left message to return call 

## 2012-11-06 NOTE — Telephone Encounter (Signed)
Patient called back and states she was prescribed Omeprazole.

## 2012-11-20 ENCOUNTER — Other Ambulatory Visit: Payer: Self-pay | Admitting: Internal Medicine

## 2012-11-30 ENCOUNTER — Encounter: Payer: Self-pay | Admitting: Internal Medicine

## 2012-11-30 ENCOUNTER — Ambulatory Visit (INDEPENDENT_AMBULATORY_CARE_PROVIDER_SITE_OTHER): Payer: 59 | Admitting: Internal Medicine

## 2012-11-30 ENCOUNTER — Other Ambulatory Visit (INDEPENDENT_AMBULATORY_CARE_PROVIDER_SITE_OTHER): Payer: 59

## 2012-11-30 VITALS — BP 176/110 | HR 109 | Temp 98.1°F | Wt 206.4 lb

## 2012-11-30 DIAGNOSIS — R197 Diarrhea, unspecified: Secondary | ICD-10-CM

## 2012-11-30 DIAGNOSIS — R739 Hyperglycemia, unspecified: Secondary | ICD-10-CM

## 2012-11-30 DIAGNOSIS — Z0000 Encounter for general adult medical examination without abnormal findings: Secondary | ICD-10-CM

## 2012-11-30 DIAGNOSIS — I10 Essential (primary) hypertension: Secondary | ICD-10-CM

## 2012-11-30 DIAGNOSIS — Z23 Encounter for immunization: Secondary | ICD-10-CM

## 2012-11-30 DIAGNOSIS — E275 Adrenomedullary hyperfunction: Secondary | ICD-10-CM

## 2012-11-30 DIAGNOSIS — E785 Hyperlipidemia, unspecified: Secondary | ICD-10-CM

## 2012-11-30 DIAGNOSIS — R7309 Other abnormal glucose: Secondary | ICD-10-CM

## 2012-11-30 DIAGNOSIS — K219 Gastro-esophageal reflux disease without esophagitis: Secondary | ICD-10-CM

## 2012-11-30 DIAGNOSIS — R110 Nausea: Secondary | ICD-10-CM

## 2012-11-30 DIAGNOSIS — R079 Chest pain, unspecified: Secondary | ICD-10-CM

## 2012-11-30 LAB — HEPATIC FUNCTION PANEL
Albumin: 4.6 g/dL (ref 3.5–5.2)
Bilirubin, Direct: 0 mg/dL (ref 0.0–0.3)
Total Protein: 8.6 g/dL — ABNORMAL HIGH (ref 6.0–8.3)

## 2012-11-30 LAB — COMPREHENSIVE METABOLIC PANEL
ALT: 32 U/L (ref 0–35)
AST: 23 U/L (ref 0–37)
Alkaline Phosphatase: 72 U/L (ref 39–117)
Creatinine, Ser: 0.6 mg/dL (ref 0.4–1.2)
Sodium: 135 mEq/L (ref 135–145)
Total Bilirubin: 0.5 mg/dL (ref 0.3–1.2)
Total Protein: 8.6 g/dL — ABNORMAL HIGH (ref 6.0–8.3)

## 2012-11-30 LAB — LIPID PANEL
HDL: 42.1 mg/dL (ref >39.00)
Total CHOL/HDL Ratio: 4
VLDL: 59.4 mg/dL — ABNORMAL HIGH (ref 0.0–40.0)

## 2012-11-30 LAB — T4, FREE: Free T4: 0.76 ng/dL (ref 0.60–1.60)

## 2012-11-30 LAB — TSH: TSH: 1.64 u[IU]/mL (ref 0.35–5.50)

## 2012-11-30 MED ORDER — DOXAZOSIN MESYLATE 4 MG PO TABS: 4.0000 mg | ORAL_TABLET | Freq: Every day | ORAL | Status: DC

## 2012-11-30 NOTE — Progress Notes (Signed)
Subjective:    Patient ID: Carolyn [name], female    DOB: [birth year], 52 y.o.   MRN: [id number]  HPI Carolyn [name] presents for a general exam. She reports that for the last month her BP has been out of control with SBP 170's. She has had headache with this. At wellness screening at work her serum glucose was 253. She also admits to having episodes of spontaneous "panic anxiety attacks" with rapid heart rate, "butterflies in her stomach," sweaty, SOB. She will also have nausea with eating, a weird feeling. She also has loose stools and some flushing. Several siblings have diabetes, one sister had a thyroid cancer. She is otherwise doing good.   She sees Debbora Dus NP at [facility] and is due in September.  Past Medical History  Diagnosis Date  . Hypertension   . Diabetes mellitus     diet controlled  . GERD (gastroesophageal reflux disease)   . Dyslipidemia    Past Surgical History  Procedure Laterality Date  . Abdominal hysterectomy      left oopherectomy  . Orif tibia & fibula fractures      Left   Family History  Problem Relation Age of Onset  . Alzheimer's disease Mother   . Hypertension Mother   . Emphysema Father   . Cancer Sister     Breast Cancer  . Diabetes Sister   . Diabetes Sister   . Emphysema Sister   . Diabetes Brother    History   Social History  . Marital Status: Married    Spouse Name: N/A    Number of Children: 2  . Years of Education: N/A   Occupational History  . [employer]   Social History Main Topics  . Smoking status: Never Smoker   . Smokeless tobacco: Not on file  . Alcohol Use: Not on file  . Drug Use: Not on file  . Sexual Activity: Not on file   Other Topics Concern  . Not on file   Social History Narrative   [employer] 1 year   Married '89   2 boys-'93, '94   Marriage-good health             Review of Systems Constitutional:  Negative for fever, chills, activity change and unexpected weight change.   HEENT:  Negative for hearing loss, ear pain, congestion, neck stiffness and postnasal drip. Negative for sore throat or swallowing problems. Negative for dental complaints.   Eyes: Positive for vision change in visual acuity - blurred vision - being followed for early cataracts.  Respiratory: Negative for chest tightness and wheezing. Negative for DOE.  But, she has SOB with "anxiety" attacks Cardiovascular: Positive for chest pain described as a tightness or a heaviness, is worse with exertion. Episodes of tachycardia with "anxiety" attacks and also an audible heart beat in her ears. No decreased exercise tolerance Gastrointestinal: change in bowel habit- intermittent diarhea and upper abdominal discomfort and regidity. Positive for bloating or gas. Positive for reflux or indigestion Genitourinary: Negative for urgency, frequency, flank pain and difficulty urinating.  Musculoskeletal: Negative for myalgias, back pain, arthralgias and gait problem.  Neurological: Negative for dizziness, tremors, weakness and headaches.  Hematological: Negative for adenopathy.  Psychiatric/Behavioral: Negative for behavioral problems and dysphoric mood.       Objective:   Physical Exam Filed Vitals:   11/30/12 0925  BP: 176/110  Pulse: 109  Temp: 98.1 F (36.7 C)   Wt Readings from Last 3 Encounters:  11/30/12 206 lb 6.4 oz (93.622 kg)  10/11/11 203 lb 1.9 oz (92.135 kg)  07/18/11 205 lb (92.987 kg)   Gen'l: well nourished, well developed AA Woman in no distress HEENT - [place]/AT, EACs/TMs normal, oropharynx with native dentition in good condition, no buccal or palatal lesions, posterior pharynx clear, mucous membranes moist. C&S clear, PERRLA, lens opacitieis noted, fund not observed.  Neck - supple, no thyromegaly, no thyroid nodules Nodes- negative submental, cervical, supraclavicular regions Chest - no deformity, no CVAT Lungs - clear without rales, wheezes. No increased work of breathing Breast -  deferred to gyn Cardiovascular - regular rate tachycardia, quiet precordium, no murmurs, rubs or gallops, 2+ radial, DP and PT pulses Abdomen - Obese BS+ x 4, no HSM, no guarding or rebound or tenderness Pelvic - deferred to gyn Rectal - deferred to gyn Extremities - no clubbing, cyanosis, edema or deformity.  Neuro - A&O x 3, CN II-XII normal, motor strength normal and equal, DTRs 2+ and symmetrical biceps, radial, and patellar tendons. Cerebellar - no tremor, no rigidity, fluid movement and normal gait. Derm - Head, neck, back, abdomen and extremities without suspicious lesions  Recent Results (from the past 2160 hour(s))  INSULIN, FASTING     Status: Abnormal   Collection Time    11/30/12 11:02 AM      Result Value Range   Insulin fasting, serum 62 (*) 3 - 28 uIU/mL  HEMOGLOBIN A1C     Status: Abnormal   Collection Time    11/30/12 11:02 AM      Result Value Range   Hemoglobin A1C 9.1 (*) 4.6 - 6.5 %   Comment: Glycemic Control Guidelines for People with Diabetes:Non Diabetic:  <6%Goal of Therapy: <7%Additional Action Suggested:  >8%   HEPATIC FUNCTION PANEL     Status: Abnormal   Collection Time    11/30/12 11:02 AM      Result Value Range   Total Bilirubin 0.5  0.3 - 1.2 mg/dL   Bilirubin, Direct 0.0  0.0 - 0.3 mg/dL   Alkaline Phosphatase 72  39 - 117 U/L   AST 23  0 - 37 U/L   ALT 32  0 - 35 U/L   Total Protein 8.6 (*) 6.0 - 8.3 g/dL   Albumin 4.6  3.5 - 5.2 g/dL  TSH     Status: None   Collection Time    11/30/12 11:02 AM      Result Value Range   TSH 1.64  0.35 - 5.50 uIU/mL  T4, FREE     Status: None   Collection Time    11/30/12 11:02 AM      Result Value Range   Free T4 0.76  0.60 - 1.60 ng/dL  COMPREHENSIVE METABOLIC PANEL     Status: Abnormal   Collection Time    11/30/12 11:02 AM      Result Value Range   Sodium 135  135 - 145 mEq/L   Potassium 3.8  3.5 - 5.1 mEq/L   Chloride 97  96 - 112 mEq/L   CO2 27  19 - 32 mEq/L   Glucose, Bld 176 (*) 70 - 99  mg/dL   BUN 12  6 - 23 mg/dL   Creatinine, Ser 0.6  0.4 - 1.2 mg/dL   Total Bilirubin 0.5  0.3 - 1.2 mg/dL   Alkaline Phosphatase 72  39 - 117 U/L   AST 23  0 - 37 U/L   ALT 32  0 - 35  U/L   Total Protein 8.6 (*) 6.0 - 8.3 g/dL   Albumin 4.6  3.5 - 5.2 g/dL   Calcium 40.9  8.4 - 81.1 mg/dL   GFR 914.78  >29.56 mL/min  LIPID PANEL     Status: Abnormal   Collection Time    11/30/12 11:02 AM      Result Value Range   Cholesterol 174  0 - 200 mg/dL   Comment: ATP III Classification       Desirable:  < 200 mg/dL               Borderline High:  200 - 239 mg/dL          High:  > = 213 mg/dL   Triglycerides 086.5 (*) 0.0 - 149.0 mg/dL   Comment: Normal:  <784 mg/dLBorderline High:  150 - 199 mg/dL   HDL 69.62  >95.28 mg/dL   VLDL 41.3 (*) 0.0 - 24.4 mg/dL   Total CHOL/HDL Ratio 4     Comment:                Men          Women1/2 Average Risk     3.4          3.3Average Risk          5.0          4.42X Average Risk          9.6          7.13X Average Risk          15.0          11.0                      CORTISOL     Status: None   Collection Time    11/30/12 11:02 AM      Result Value Range   Cortisol, Plasma 9.9     Comment: AM:  4.3 - 22.4 ug/dLPM:  3.1 - 16.7 ug/dL  LDL CHOLESTEROL, DIRECT     Status: None   Collection Time    11/30/12 11:02 AM      Result Value Range   Direct LDL 99.8     Comment: Optimal:  <100 mg/dLNear or Above Optimal:  100-129 mg/dLBorderline High:  130-159 mg/dLHigh:  160-189 mg/dLVery High:  >190 mg/dL         Assessment & Plan:  Multiple endocrine related problems - loss of BP control, new onset hyperglycemia, change in GI function raises concern for MEN: several family members with recent on-set diabetes, one sister with thyroid cancer.  PLan Will r/o pheo and carcinoid tumor with 24 hr urine studies  General labs including A1C, TSH, FT4  Based on results will consider imaging: CT abd/pelvis, U/S soft tissue neck.  Add alpha-blocker to BP meds, e.g.  cardura 4 mg daily

## 2012-11-30 NOTE — Progress Notes (Signed)
Subjective:     Patient ID: Carolyn [name], female   DOB: April 03, 1961, 52 y.o.   MRN: [id number]  HPI  Review of Systems  Objective:   Physical Exam    Assessment and Plan:

## 2012-11-30 NOTE — Patient Instructions (Addendum)
1. Elevated Blood pressure and these "anxiety" attacks which may be a surge of adrenaline, etc due to a benign tumor ofd the adrenal gland called a pheochromocytoma.   Plan Will continue present medications  Add doxazosin 4 mg once a day  Multiple labs to look for adrenal problems  2. Elevated blood sugar - new onset. Concern now broadens to possible syndrome called Multiple endocrine neoplasia 2.   Plan Labs as above   Will need to decide about imaging studies or other treatment after we get some lab information back  3. Health maintenance - keep you gyn appointment  Plan Routine labs  Tdap today.

## 2012-12-01 DIAGNOSIS — Z0000 Encounter for general adult medical examination without abnormal findings: Secondary | ICD-10-CM | POA: Insufficient documentation

## 2012-12-01 NOTE — Assessment & Plan Note (Signed)
Problem of flushing with eating, change in bowel habit - to r/o carcinoid.  Plan 24 hr urine for 5-HIAA

## 2012-12-01 NOTE — Assessment & Plan Note (Signed)
BP Readings from Last 3 Encounters:  11/30/12 176/110  10/11/11 130/82  07/18/11 130/92   Sudden loss of control of BP. Patient had RAS ruled out in '12 with normal study. She does admit to random episodes of tachycardia, flushing, SOB, fight-flight feeling  Plan Add Cardura for BP control  R/o Pheochromocytoma - 24 hr urine for catecholamine and metanephrines.  Close follow up.

## 2012-12-01 NOTE — Assessment & Plan Note (Signed)
Interval history - multiple new issues: loss of BP control, new on-set elevated glucose, GI problems: work up for MEN 2. Physical exam notable for weight. Labs - thyroid normal, lipids with good control, chemistry normal except for elevated glucose, A1C elevated. Labs pending - 24 hr urines. She has an appointment with gyn coming up for pelvic/pap and breast exam. She is now due for colonoscopy - deferred until current work-up complete; due for mammogram. Immunizations are brought up to date with Tdap.  In summary - a nice woman with an unusual constellation of symptoms leading to more detailed evaluation.

## 2012-12-01 NOTE — Assessment & Plan Note (Signed)
Lipid profile with LDL better than goal of 100 or less. LFTs normals  Plan Continue present medications.

## 2012-12-02 ENCOUNTER — Ambulatory Visit (INDEPENDENT_AMBULATORY_CARE_PROVIDER_SITE_OTHER): Payer: 59

## 2012-12-02 ENCOUNTER — Ambulatory Visit (INDEPENDENT_AMBULATORY_CARE_PROVIDER_SITE_OTHER): Payer: 59 | Admitting: Internal Medicine

## 2012-12-02 ENCOUNTER — Encounter: Payer: Self-pay | Admitting: Internal Medicine

## 2012-12-02 VITALS — BP 152/102 | HR 116 | Temp 98.2°F | Wt 203.0 lb

## 2012-12-02 VITALS — BP 152/102

## 2012-12-02 DIAGNOSIS — E1165 Type 2 diabetes mellitus with hyperglycemia: Secondary | ICD-10-CM

## 2012-12-02 DIAGNOSIS — I10 Essential (primary) hypertension: Secondary | ICD-10-CM

## 2012-12-02 DIAGNOSIS — E118 Type 2 diabetes mellitus with unspecified complications: Secondary | ICD-10-CM | POA: Insufficient documentation

## 2012-12-02 LAB — INSULIN, FASTING: Insulin fasting, serum: 62 u[IU]/mL — ABNORMAL HIGH (ref 3–28)

## 2012-12-02 MED ORDER — ATENOLOL 100 MG PO TABS: 100.0000 mg | ORAL_TABLET | Freq: Every day | ORAL | Status: DC

## 2012-12-02 MED ORDER — METFORMIN HCL 500 MG PO TABS: 500.0000 mg | ORAL_TABLET | Freq: Two times a day (BID) | ORAL | Status: DC

## 2012-12-02 NOTE — Assessment & Plan Note (Signed)
BP remains elevated - heart rate remains elevated.  Plan Continue present medications but increase tenormin to 100 mg daily

## 2012-12-02 NOTE — Assessment & Plan Note (Signed)
Diagnosis is established by A1C of 9.1%  Plan Metformin 500 mg twice a day (once a day for the first week)  Diabetic diet information.

## 2012-12-02 NOTE — Patient Instructions (Addendum)
1. Diabetes - the A1C is 9.1% definitely makes a diagnosis of diabetes  Plan Metformin 500 mg twice a day ( once a day for the first week)  Diabetic diet and exercise (see below)  2. Blood pressure - still too high  Plan - continue all your medications but increase tenormin to 100 mg daily ( 2 x 50 mg till gone.)  Thyroid function is normal. Other tests are pending. 1800 Calorie Diet for Diabetes Meal Planning The 1800 calorie diet is designed for eating up to 1800 calories each day. Following this diet and making healthy meal choices can help improve overall health. This diet controls blood sugar (glucose) levels and can also help lower blood pressure and cholesterol. SERVING SIZES Measuring foods and serving sizes helps to make sure you are getting the right amount of food. The list below tells how big or small some common serving sizes are:  1 oz.........4 stacked dice.  3 oz........Marland KitchenDeck of cards.  1 tsp.......Marland KitchenTip of little finger.  1 tbs......Marland KitchenMarland KitchenThumb.  2 tbs.......Marland KitchenGolf ball.   cup......Marland KitchenHalf of a fist.  1 cup.......Marland KitchenA fist. GUIDELINES FOR CHOOSING FOODS The goal of this diet is to eat a variety of foods and limit calories to 1800 each day. This can be done by choosing foods that are low in calories and fat. The diet also suggests eating small amounts of food frequently. Doing this helps control your blood glucose levels so they do not get too high or too low. Each meal or snack may include a protein food source to help you feel more satisfied and to stabilize your blood glucose. Try to eat about the same amount of food around the same time each day. This includes weekend days, travel days, and days off work. Space your meals about 4 to 5 hours apart and add a snack between them if you wish.  For example, a daily food plan could include breakfast, a morning snack, lunch, dinner, and an evening snack. Healthy meals and snacks include whole grains, vegetables, fruits, lean meats,  poultry, fish, and dairy products. As you plan your meals, select a variety of foods. Choose from the bread and starch, vegetable, fruit, dairy, and meat/protein groups. Examples of foods from each group and their suggested serving sizes are listed below. Use measuring cups and spoons to become familiar with what a healthy portion looks like. Bread and Starch Each serving equals 15 grams of carbohydrates.  1 slice bread.   bagel.   cup cold cereal (unsweetened).   cup hot cereal or mashed potatoes.  1 small potato (size of a computer mouse).   cup cooked pasta or rice.   English muffin.  1 cup broth-based soup.  3 cups of popcorn.  4 to 6 whole-wheat crackers.   cup cooked beans, peas, or corn. Vegetable Each serving equals 5 grams of carbohydrates.   cup cooked vegetables.  1 cup raw vegetables.   cup tomato or vegetable juice. Fruit Each serving equals 15 grams of carbohydrates.  1 small apple or orange.  1 cup watermelon or strawberries.   cup applesauce (no sugar added).  2 tbs raisins.   banana.   cup canned fruit, packed in water, its own juice, or sweetened with a sugar substitute.   cup unsweetened fruit juice. Dairy Each serving equals 12 to 15 grams of carbohydrates.  1 cup fat-free milk.  6 oz artificially sweetened yogurt or plain yogurt.  1 cup low-fat buttermilk.  1 cup soy milk.  1 cup  almond milk. Meat/Protein  1 large egg.  2 to 3 oz meat, poultry, or fish.   cup low-fat cottage cheese.  1 tbs peanut butter.  1 oz low-fat cheese.   cup tuna in water.   cup tofu. Fat  1 tsp oil.  1 tsp trans-fat-free margarine.  1 tsp butter.  1 tsp mayonnaise.  2 tbs avocado.  1 tbs salad dressing.  1 tbs cream cheese.  2 tbs sour cream. SAMPLE 1800 CALORIE DIET PLAN Breakfast   cup unsweetened cereal (1 carb serving).  1 cup fat-free milk (1 carb serving).  1 slice whole-wheat toast (1 carb  serving).   small banana (1 carb serving).  1 scrambled egg.  1 tsp trans-fat-free margarine. Lunch  Tuna sandwich.  2 slices whole-wheat bread (2 carb servings).   cup canned tuna in water, drained.  1 tbs reduced fat mayonnaise.  1 stalk celery, chopped.  2 slices tomato.  1 lettuce leaf.  1 cup carrot sticks.  24 to 30 seedless grapes (2 carb servings).  6 oz light yogurt (1 carb serving). Afternoon Snack  3 graham cracker squares (1 carb serving).  Fat-free milk, 1 cup (1 carb serving).  1 tbs peanut butter. Dinner  3 oz salmon, broiled with 1 tsp oil.  1 cup mashed potatoes (2 carb servings) with 1 tsp trans-fat-free margarine.  1 cup fresh or frozen green beans.  1 cup steamed asparagus.  1 cup fat-free milk (1 carb serving). Evening Snack  3 cups air-popped popcorn (1 carb serving).  2 tbs parmesan cheese sprinkled on top. MEAL PLAN Use this worksheet to help you make a daily meal plan based on the 1800 calorie diet suggestions. If you are using this plan to help you control your blood glucose, you may interchange carbohydrate-containing foods (dairy, starches, and fruits). Select a variety of fresh foods of varying colors and flavors. The total amount of carbohydrate in your meals or snacks is more important than making sure you include all of the food groups every time you eat. Choose from the following foods to build your day's meals:  8 Starches.  4 Vegetables.  3 Fruits.  2 Dairy.  6 to 7 oz Meat/Protein.  Up to 4 Fats. Your dietician can use this worksheet to help you decide how many servings and which types of foods are right for you. BREAKFAST Food Group and Servings / Food Choice Starch ________________________________________________________ Dairy _________________________________________________________ Fruit _________________________________________________________ Meat/Protein  __________________________________________________ Fat ___________________________________________________________ LUNCH Food Group and Servings / Food Choice Starch ________________________________________________________ Meat/Protein __________________________________________________ Vegetable _____________________________________________________ Fruit _________________________________________________________ Dairy _________________________________________________________ Fat ___________________________________________________________ Aura Fey Food Group and Servings / Food Choice Starch ________________________________________________________ Meat/Protein __________________________________________________ Fruit __________________________________________________________ Dairy _________________________________________________________ Laural Golden Food Group and Servings / Food Choice Starch _________________________________________________________ Meat/Protein ___________________________________________________ Dairy __________________________________________________________ Vegetable ______________________________________________________ Fruit ___________________________________________________________ Fat ____________________________________________________________ Lollie Sails Food Group and Servings / Food Choice Fruit __________________________________________________________ Meat/Protein ___________________________________________________ Dairy __________________________________________________________ Starch _________________________________________________________ DAILY TOTALS Starch ____________________________ Vegetable _________________________ Fruit _____________________________ Dairy _____________________________ Meat/Protein______________________ Fat _______________________________ Document Released: 10/22/2004 Document Revised: 06/24/2011 Document Reviewed:  02/15/2011 [employer] Patient Information 2014 [employer]   Type 2 Diabetes Mellitus, Adult Type 2 diabetes mellitus, often simply referred to as type 2 diabetes, is a long-lasting (chronic) disease. In type 2 diabetes, the pancreas does not make enough insulin (a hormone), the cells are less responsive to the insulin that is made (insulin resistance), or both. Normally, insulin moves sugars from food into the tissue cells. The tissue cells use the sugars for energy. The  lack of insulin or the lack of normal response to insulin causes excess sugars to build up in the blood instead of going into the tissue cells. As a result, high blood sugar (hyperglycemia) develops. The effect of high sugar (glucose) levels can cause many complications. Type 2 diabetes was also previously called adult-onset diabetes but it can occur at any age.  RISK FACTORS  A person is predisposed to developing type 2 diabetes if someone in the family has the disease and also has one or more of the following primary risk factors:  Overweight.  An inactive lifestyle.  A history of consistently eating high-calorie foods. Maintaining a normal weight and regular physical activity can reduce the chance of developing type 2 diabetes. SYMPTOMS  A person with type 2 diabetes may not show symptoms initially. The symptoms of type 2 diabetes appear slowly. The symptoms include:  Increased thirst (polydipsia).  Increased urination (polyuria).  Increased urination during the night (nocturia).  Weight loss. This weight loss may be rapid.  Frequent, recurring infections.  Tiredness (fatigue).  Weakness.  Vision changes, such as blurred vision.  Fruity smell to your breath.  Abdominal pain.  Nausea or vomiting.  Cuts or bruises which are slow to heal.  Tingling or numbness in the hands or feet. DIAGNOSIS Type 2 diabetes is frequently not diagnosed until complications of diabetes are present. Type 2 diabetes is  diagnosed when symptoms or complications are present and when blood glucose levels are increased. Your blood glucose level may be checked by one or more of the following blood tests:  A fasting blood glucose test. You will not be allowed to eat for at least 8 hours before a blood sample is taken.  A random blood glucose test. Your blood glucose is checked at any time of the day regardless of when you ate.  A hemoglobin A1c blood glucose test. A hemoglobin A1c test provides information about blood glucose control over the previous 3 months.  An oral glucose tolerance test (OGTT). Your blood glucose is measured after you have not eaten (fasted) for 2 hours and then after you drink a glucose-containing beverage. TREATMENT   You may need to take insulin or diabetes medicine daily to keep blood glucose levels in the desired range.  You will need to match insulin dosing with exercise and healthy food choices. The treatment goal is to maintain the before meal blood sugar (preprandial glucose) level at 70 130 mg/dL. HOME CARE INSTRUCTIONS   Have your hemoglobin A1c level checked twice a year.  Perform daily blood glucose monitoring as directed by your caregiver.  Monitor urine ketones when you are ill and as directed by your caregiver.  Take your diabetes medicine or insulin as directed by your caregiver to maintain your blood glucose levels in the desired range.  Never run out of diabetes medicine or insulin. It is needed every day.  Adjust insulin based on your intake of carbohydrates. Carbohydrates can raise blood glucose levels but need to be included in your diet. Carbohydrates provide vitamins, minerals, and fiber which are an essential part of a healthy diet. Carbohydrates are found in fruits, vegetables, whole grains, dairy products, legumes, and foods containing added sugars.    Eat healthy foods. Alternate 3 meals with 3 snacks.  Lose weight if overweight.  Carry a medical alert  card or wear your medical alert jewelry.  Carry a 15 gram carbohydrate snack with you at all times to treat low blood glucose (hypoglycemia). Some examples  of 15 gram carbohydrate snacks include:  Glucose tablets, 3 or 4   Glucose gel, 15 gram tube  Raisins, 2 tablespoons (24 grams)  Jelly beans, 6  Animal crackers, 8  Regular pop, 4 ounces (120 mL)  Gummy treats, 9  Recognize hypoglycemia. Hypoglycemia occurs with blood glucose levels of 70 mg/dL and below. The risk for hypoglycemia increases when fasting or skipping meals, during or after intense exercise, and during sleep. Hypoglycemia symptoms can include:  Tremors or shakes.  Decreased ability to concentrate.  Sweating.  Increased heart rate.  Headache.  Dry mouth.  Hunger.  Irritability.  Anxiety.  Restless sleep.  Altered speech or coordination.  Confusion.  Treat hypoglycemia promptly. If you are alert and able to safely swallow, follow the 15:15 rule:  Take 15 20 grams of rapid-acting glucose or carbohydrate. Rapid-acting options include glucose gel, glucose tablets, or 4 ounces (120 mL) of fruit juice, regular soda, or low fat milk.  Check your blood glucose level 15 minutes after taking the glucose.  Take 15 20 grams more of glucose if the repeat blood glucose level is still 70 mg/dL or below.  Eat a meal or snack within 1 hour once blood glucose levels return to normal.    Be alert to polyuria and polydipsia which are early signs of hyperglycemia. An early awareness of hyperglycemia allows for prompt treatment. Treat hyperglycemia as directed by your caregiver.  Engage in at least 150 minutes of moderate-intensity physical activity a week, spread over at least 3 days of the week or as directed by your caregiver. In addition, you should engage in resistance exercise at least 2 times a week or as directed by your caregiver.  Adjust your medicine and food intake as needed if you start a new  exercise or sport.  Follow your sick day plan at any time you are unable to eat or drink as usual.  Avoid tobacco use.  Limit alcohol intake to no more than 1 drink per day for nonpregnant women and 2 drinks per day for men. You should drink alcohol only when you are also eating food. Talk with your caregiver whether alcohol is safe for you. Tell your caregiver if you drink alcohol several times a week.  Follow up with your caregiver regularly.  Schedule an eye exam soon after the diagnosis of type 2 diabetes and then annually.  Perform daily skin and foot care. Examine your skin and feet daily for cuts, bruises, redness, nail problems, bleeding, blisters, or sores. A foot exam by a caregiver should be done annually.  Brush your teeth and gums at least twice a day and floss at least once a day. Follow up with your dentist regularly.  Share your diabetes management plan with your workplace or school.  Stay up-to-date with immunizations.  Learn to manage stress.  Obtain ongoing diabetes education and support as needed.  Participate in, or seek rehabilitation as needed to maintain or improve independence and quality of life. Request a physical or occupational therapy referral if you are having foot or hand numbness or difficulties with grooming, dressing, eating, or physical activity. SEEK MEDICAL CARE IF:   You are unable to eat food or drink fluids for more than 6 hours.  You have nausea and vomiting for more than 6 hours.  Your blood glucose level is over 240 mg/dL.  There is a change in mental status.  You develop an additional serious illness.  You have diarrhea for more than 6 hours.  You have been sick or have had a fever for a couple of days and are not getting better.  You have pain during any physical activity.  SEEK IMMEDIATE MEDICAL CARE IF:  You have difficulty breathing.  You have moderate to large ketone levels. MAKE SURE YOU:  Understand these  instructions.  Will watch your condition.  Will get help right away if you are not doing well or get worse. Document Released: 04/01/2005 Document Revised: 12/25/2011 Document Reviewed: 10/29/2011 [facility] Patient Information 2014 [place], [place].

## 2012-12-02 NOTE — Progress Notes (Signed)
  Subjective:    Patient ID: Carolyn [name], female    DOB: May 04, 1960, 52 y.o.   MRN: [id number]  HPI Carolyn [name] returns for BP check - still running too high. She has started and tolerates cardura but she is still have episodes of "anxiety" with tachycardia, flushing, sweating, etc. 24 hr urine studies are pending.  Reviewed her lab: A1C 9.1% = she is diabetic; cortisol normal, Bmet normal except for glucose, LFTs normal, insulin level elevated   Review of Systems     Objective:   Physical Exam        Assessment & Plan:

## 2012-12-03 ENCOUNTER — Other Ambulatory Visit: Payer: 59

## 2012-12-06 ENCOUNTER — Other Ambulatory Visit: Payer: Self-pay | Admitting: Internal Medicine

## 2012-12-07 LAB — 5 HIAA, QUANTITATIVE, URINE, 24 HOUR: 5-HIAA, 24 Hr Urine: 4.4 mg/24 h (ref <=6.0)

## 2012-12-21 ENCOUNTER — Ambulatory Visit (INDEPENDENT_AMBULATORY_CARE_PROVIDER_SITE_OTHER): Payer: 59 | Admitting: Internal Medicine

## 2012-12-21 ENCOUNTER — Encounter: Payer: Self-pay | Admitting: Internal Medicine

## 2012-12-21 ENCOUNTER — Telehealth: Payer: Self-pay | Admitting: *Deleted

## 2012-12-21 VITALS — BP 140/96 | HR 76 | Temp 98.2°F | Wt 204.0 lb

## 2012-12-21 DIAGNOSIS — I10 Essential (primary) hypertension: Secondary | ICD-10-CM

## 2012-12-21 DIAGNOSIS — R51 Headache: Secondary | ICD-10-CM

## 2012-12-21 DIAGNOSIS — E1165 Type 2 diabetes mellitus with hyperglycemia: Secondary | ICD-10-CM

## 2012-12-21 MED ORDER — PROMETHAZINE HCL 12.5 MG PO TABS: 12.5000 mg | ORAL_TABLET | Freq: Three times a day (TID) | ORAL | Status: DC | PRN

## 2012-12-21 MED ORDER — KETOROLAC TROMETHAMINE 60 MG/2ML IM SOLN
60.0000 mg | Freq: Once | INTRAMUSCULAR | Status: AC
  Administered 2012-12-21: 60 mg via INTRAMUSCULAR

## 2012-12-21 MED ORDER — METFORMIN HCL 1000 MG PO TABS: 1000.0000 mg | ORAL_TABLET | Freq: Two times a day (BID) | ORAL | Status: DC

## 2012-12-21 NOTE — Assessment & Plan Note (Signed)
BP Readings from Last 3 Encounters:  12/21/12 140/96  12/02/12 152/102  12/02/12 152/102   Reasonable control on present meds.   Plan Monitor BP at home  Will follow up in 2 months with adjustments to medications as indicated.

## 2012-12-21 NOTE — Telephone Encounter (Signed)
[facility] Triage Call Report Triage Record Num: [id number] Operator: Donna Bernard Patient Name: Carolyn [name] Call Date & Time: 12/20/2012 2:52:21PM Patient Phone: [phone] PCP: Illene Regulus Patient Gender: Female PCP Fax : [phone] Patient DOB: [birth year] [facility] Name: [facility] Reason for Call: Caller: Debe/Patient; PCP: Illene Regulus (Adults only); CB#: [phone]; Call regarding Headache; onset 12/19/2012 of throbbing headache (9/10) with Tylenol 500 mg x 2 and 12/20/2012 (10/10) with Tylenol at 11000, now 8/10, excess hunger and thirst; LMP Unknown; emergent sxs r/o per "Headache" protocol except "New onset of severe headache unrelieved with 4 hours of home care" positive with disposition of "See Provider within 4 hours"; advised patient to go to [facility] or ED for evaluation and her preference is [facility] ED. Protocol(s) Used: Headache Recommended Outcome per Protocol: See Provider within 4 hours Reason for Outcome: New onset of severe headache unrelieved with 4 hours of home care Care Advice: ~ 09/

## 2012-12-21 NOTE — Patient Instructions (Addendum)
1. Headache - sounds like a tension headache. Not related to diabetes but related to stress Plan toradol 60 mg injection now ` Phenergan 12.5 mg every 8 hours as needed for nausea  Patient information  2. Diabetes - sounds like sugars are too high Plan Increase metformin to 1,000 mg twice a day  Glucometer - you only need to check your sugar twice a week or if you are feeling bad.  Referral to nutritionist  3. Blood pressure - borderline today (you are not feeling well) Plan  Continue present medications  Check BP at home and report back readings.   You need to work hard on life-style management: no sugar, low carb calorie limited diet; regular exercise.!!!! You are worth the effort.    Tension Headache A tension headache is a feeling of pain, pressure, or aching often felt over the front and sides of the head. The pain can be dull or can feel tight (constricting). It is the most common type of headache. Tension headaches are not normally associated with nausea or vomiting and do not get worse with physical activity. Tension headaches can last 30 minutes to several days.  CAUSES  The exact cause is not known, but it may be caused by chemicals and hormones in the brain that lead to pain. Tension headaches often begin after stress, anxiety, or depression. Other triggers may include:  Alcohol.  Caffeine (too much or withdrawal).  Respiratory infections (colds, flu, sinus infections).  Dental problems or teeth clenching.  Fatigue.  Holding your head and neck in one position too long while using a computer. SYMPTOMS   Pressure around the head.   Dull, aching head pain.   Pain felt over the front and sides of the head.   Tenderness in the muscles of the head, neck, and shoulders. DIAGNOSIS  A tension headache is often diagnosed based on:   Symptoms.   Physical examination.   A CT scan or MRI of your head. These tests may be ordered if symptoms are severe or  unusual. TREATMENT  Medicines may be given to help relieve symptoms.  HOME CARE INSTRUCTIONS   Only take over-the-counter or prescription medicines for pain or discomfort as directed by your caregiver.   Lie down in a dark, quiet room when you have a headache.   Keep a journal to find out what may be triggering your headaches. For example, write down:  What you eat and drink.  How much sleep you get.  Any change to your diet or medicines.  Try massage or other relaxation techniques.   Ice packs or heat applied to the head and neck can be used. Use these 3 to 4 times per day for 15 to 20 minutes each time, or as needed.   Limit stress.   Sit up straight, and do not tense your muscles.   Quit smoking if you smoke.  Limit alcohol use.  Decrease the amount of caffeine you drink, or stop drinking caffeine.  Eat and exercise regularly.  Get 7 to 9 hours of sleep, or as recommended by your caregiver.  Avoid excessive use of pain medicine as recurrent headaches can occur.  SEEK MEDICAL CARE IF:   You have problems with the medicines you were prescribed.  Your medicines do not work.  You have a change from the usual headache.  You have nausea or vomiting. SEEK IMMEDIATE MEDICAL CARE IF:   Your headache becomes severe.  You have a fever.  You have a  stiff neck.  You have loss of vision.  You have muscular weakness or loss of muscle control.  You lose your balance or have trouble walking.  You feel faint or pass out.  You have severe symptoms that are different from your first symptoms. MAKE SURE YOU:   Understand these instructions.  Will watch your condition.  Will get help right away if you are not doing well or get worse. Document Released: 04/01/2005 Document Revised: 06/24/2011 Document Reviewed: 03/22/2011 [facility] Patient Information 2014 [place], [place].

## 2012-12-21 NOTE — Assessment & Plan Note (Signed)
By description and exam this is a tension headache  Plan Patient education  Toradol 60 mg IM  Phenergan 12. 5 mg q 6

## 2012-12-21 NOTE — Progress Notes (Signed)
Subjective:    Patient ID: Carolyn [name], female    DOB: 24-Jan-1961, 52 y.o.   MRN: [id number]  HPI Carolyn [name] presents for a 4 day h/o headache with some progression. She had photophobia, nausea, paresthesia in the fingers. Similar to previous migraine headaches in the past. She has had full workup previously including CT brain n '12 that was negative. She reports that Imitrex has not worked in the past. She describes the headache as a crushing sensation from forehead to occiput and down the neck. She has taken APAP several times at 1,000 mg qid and Advil last Friday - 400 mg.   She is struggling with her diabetes - CBGs run high and her weight has gone up. She requests diabetic nutritional counseling.  Past Medical History  Diagnosis Date  . Hypertension   . Diabetes mellitus     diet controlled  . GERD (gastroesophageal reflux disease)   . Dyslipidemia    Past Surgical History  Procedure Laterality Date  . Abdominal hysterectomy      left oopherectomy  . Orif tibia & fibula fractures      Left   Family History  Problem Relation Age of Onset  . Alzheimer's disease Mother   . Hypertension Mother   . Emphysema Father   . Cancer Sister     Breast Cancer  . Diabetes Sister   . Diabetes Sister   . Emphysema Sister   . Diabetes Brother   . Thyroid cancer Sister    History   Social History  . Marital Status: Married    Spouse Name: N/A    Number of Children: 2  . Years of Education: 13   Occupational History  . Customer Service     [employer]    Social History Main Topics  . Smoking status: Never Smoker   . Smokeless tobacco: Never Used  . Alcohol Use: No  . Drug Use: No  . Sexual Activity: Not on file   Other Topics Concern  . Not on file   Social History Narrative   HSG, [employer] 1 year. Married '89. 2 boys-'93, '94. 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   Marriage-good health             Current Outpatient Prescriptions on File Prior to Visit   Medication Sig Dispense Refill  . atenolol (TENORMIN) 100 MG tablet Take 1 tablet (100 mg total) by mouth daily.  90 tablet  0  . doxazosin (CARDURA) 4 MG tablet Take 1 tablet (4 mg total) by mouth at bedtime.  30 tablet  5  . hydrochlorothiazide (HYDRODIURIL) 25 MG tablet TAKE ONE TABLET BY MOUTH EVERY DAY  90 tablet  0  . losartan (COZAAR) 100 MG tablet TAKE ONE TABLET BY MOUTH EVERY DAY  90 tablet  0  . metFORMIN (GLUCOPHAGE) 500 MG tablet Take 1 tablet (500 mg total) by mouth 2 (two) times daily with a meal.  60 tablet  3  . omeprazole (PRILOSEC) 20 MG capsule TAKE ONE CAPSULE BY MOUTH ONCE DAILY  30 capsule  3  . simvastatin (ZOCOR) 40 MG tablet TAKE ONE TABLET BY MOUTH EVERY DAY AT BEDTIME  90 tablet  0   No current facility-administered medications on file prior to visit.      Review of Systems System review is negative for any constitutional, cardiac, pulmonary, GI or neuro symptoms or complaints other than as described in the HPI.     Objective:   Physical Exam Filed  Vitals:   12/21/12 1358  BP: 140/96  Pulse: 76  Temp: 98.2 F (36.8 C)   Wt Readings from Last 3 Encounters:  12/21/12 204 lb (92.534 kg)  12/02/12 203 lb (92.08 kg)  11/30/12 206 lb 6.4 oz (93.622 kg)   BP Readings from Last 3 Encounters:  12/21/12 140/96  12/02/12 152/102  12/02/12 152/102   gen'l - mildly overweight AA woman HEENT - C&S clear,TMs normal Neck - supple Nodes - negative Cor- 2+ radial pulse, RRR PUlm - clear to AP Neuro - A&O x 3, CN II-XII normal facial symmetry, PERRLA, EOMI, no deviation of tongue, no fasciculations, normal motor strength.         Assessment & Plan:

## 2012-12-21 NOTE — Assessment & Plan Note (Signed)
Patient reports elevated serum glucose at home. She would like to see a nutritionist.  Plan Increase metformin to 1,000 mg   A1C Nov 15th or after

## 2013-01-14 ENCOUNTER — Encounter (HOSPITAL_COMMUNITY): Payer: Self-pay | Admitting: Physician Assistant

## 2013-01-14 ENCOUNTER — Observation Stay (HOSPITAL_COMMUNITY): Payer: 59

## 2013-01-14 ENCOUNTER — Observation Stay (HOSPITAL_COMMUNITY)
Admission: AD | Admit: 2013-01-14 | Discharge: 2013-01-16 | Disposition: A | Discharge status: Home / Self Care | Payer: 59 | Source: Ambulatory Visit | Attending: Internal Medicine | Admitting: Internal Medicine

## 2013-01-14 ENCOUNTER — Ambulatory Visit (INDEPENDENT_AMBULATORY_CARE_PROVIDER_SITE_OTHER): Payer: 59 | Admitting: Internal Medicine

## 2013-01-14 ENCOUNTER — Encounter: Payer: Self-pay | Admitting: Internal Medicine

## 2013-01-14 VITALS — BP 142/96 | HR 78 | Temp 97.6°F | Wt 204.8 lb

## 2013-01-14 DIAGNOSIS — I209 Angina pectoris, unspecified: Secondary | ICD-10-CM | POA: Insufficient documentation

## 2013-01-14 DIAGNOSIS — F410 Panic disorder [episodic paroxysmal anxiety] without agoraphobia: Secondary | ICD-10-CM | POA: Insufficient documentation

## 2013-01-14 DIAGNOSIS — C9000 Multiple myeloma not having achieved remission: Secondary | ICD-10-CM | POA: Insufficient documentation

## 2013-01-14 DIAGNOSIS — R5381 Other malaise: Secondary | ICD-10-CM | POA: Insufficient documentation

## 2013-01-14 DIAGNOSIS — G43909 Migraine, unspecified, not intractable, without status migrainosus: Secondary | ICD-10-CM | POA: Insufficient documentation

## 2013-01-14 DIAGNOSIS — R51 Headache: Secondary | ICD-10-CM | POA: Insufficient documentation

## 2013-01-14 DIAGNOSIS — Z780 Asymptomatic menopausal state: Secondary | ICD-10-CM | POA: Insufficient documentation

## 2013-01-14 DIAGNOSIS — M310 Hypersensitivity angiitis: Secondary | ICD-10-CM | POA: Insufficient documentation

## 2013-01-14 DIAGNOSIS — R0602 Shortness of breath: Secondary | ICD-10-CM | POA: Insufficient documentation

## 2013-01-14 DIAGNOSIS — E785 Hyperlipidemia, unspecified: Secondary | ICD-10-CM

## 2013-01-14 DIAGNOSIS — I200 Unstable angina: Principal | ICD-10-CM | POA: Diagnosis present

## 2013-01-14 DIAGNOSIS — D571 Sickle-cell disease without crisis: Secondary | ICD-10-CM | POA: Insufficient documentation

## 2013-01-14 DIAGNOSIS — IMO0001 Reserved for inherently not codable concepts without codable children: Secondary | ICD-10-CM | POA: Insufficient documentation

## 2013-01-14 DIAGNOSIS — I10 Essential (primary) hypertension: Secondary | ICD-10-CM

## 2013-01-14 DIAGNOSIS — E118 Type 2 diabetes mellitus with unspecified complications: Secondary | ICD-10-CM | POA: Diagnosis present

## 2013-01-14 DIAGNOSIS — E1165 Type 2 diabetes mellitus with hyperglycemia: Secondary | ICD-10-CM

## 2013-01-14 DIAGNOSIS — R079 Chest pain, unspecified: Secondary | ICD-10-CM

## 2013-01-14 DIAGNOSIS — E119 Type 2 diabetes mellitus without complications: Secondary | ICD-10-CM

## 2013-01-14 DIAGNOSIS — K219 Gastro-esophageal reflux disease without esophagitis: Secondary | ICD-10-CM

## 2013-01-14 HISTORY — DX: Migraine, unspecified, not intractable, without status migrainosus: G43.909

## 2013-01-14 HISTORY — DX: Type 2 diabetes mellitus without complications: E11.9

## 2013-01-14 HISTORY — DX: Shortness of breath: R06.02

## 2013-01-14 HISTORY — DX: Panic disorder (episodic paroxysmal anxiety): F41.0

## 2013-01-14 LAB — COMPREHENSIVE METABOLIC PANEL
ALT: 18 U/L (ref 0–35)
AST: 13 U/L (ref 0–37)
Alkaline Phosphatase: 68 U/L (ref 39–117)
BUN: 10 mg/dL (ref 6–23)
Chloride: 101 mEq/L (ref 96–112)
GFR calc Af Amer: >90 mL/min (ref >90)
Glucose, Bld: 130 mg/dL — ABNORMAL HIGH (ref 70–99)
Potassium: 3.2 mEq/L — ABNORMAL LOW (ref 3.5–5.1)
Sodium: 141 mEq/L (ref 135–145)
Total Bilirubin: 0.3 mg/dL (ref 0.3–1.2)
Total Protein: 8 g/dL (ref 6.0–8.3)

## 2013-01-14 LAB — GLUCOSE, CAPILLARY: Glucose-Capillary: 130 mg/dL — ABNORMAL HIGH (ref 70–99)

## 2013-01-14 LAB — CBC
HCT: 30.6 % — ABNORMAL LOW (ref 36.0–46.0)
Hemoglobin: 10.5 g/dL — ABNORMAL LOW (ref 12.0–15.0)
MCH: 28.8 pg (ref 26.0–34.0)
MCHC: 34.3 g/dL (ref 30.0–36.0)
MCV: 84.1 fL (ref 78.0–100.0)
RDW: 13.6 % (ref 11.5–15.5)

## 2013-01-14 LAB — MAGNESIUM: Magnesium: 1.5 mg/dL (ref 1.5–2.5)

## 2013-01-14 LAB — TROPONIN I: Troponin I: <0.3 ng/mL (ref <0.30)

## 2013-01-14 LAB — PROTIME-INR
INR: 1.04 (ref 0.00–1.49)
Prothrombin Time: 13.4 seconds (ref 11.6–15.2)

## 2013-01-14 LAB — APTT: aPTT: 33 seconds (ref 24–37)

## 2013-01-14 MED ORDER — ASPIRIN EC 81 MG PO TBEC
81.0000 mg | DELAYED_RELEASE_TABLET | Freq: Every day | ORAL | Status: DC
  Administered 2013-01-14 – 2013-01-16 (×3): 81 mg via ORAL
  Filled 2013-01-14 (×3): qty 1

## 2013-01-14 MED ORDER — HEPARIN (PORCINE) IN NACL 100-0.45 UNIT/ML-% IJ SOLN
1250.0000 [IU]/h | INTRAMUSCULAR | Status: DC
  Administered 2013-01-14: 1000 [IU]/h via INTRAVENOUS
  Administered 2013-01-15: 1250 [IU]/h via INTRAVENOUS
  Filled 2013-01-14 (×2): qty 250

## 2013-01-14 MED ORDER — LOSARTAN POTASSIUM 50 MG PO TABS
100.0000 mg | ORAL_TABLET | Freq: Every day | ORAL | Status: DC
  Administered 2013-01-15 – 2013-01-16 (×2): 100 mg via ORAL
  Filled 2013-01-14 (×2): qty 2

## 2013-01-14 MED ORDER — ATENOLOL 100 MG PO TABS
100.0000 mg | ORAL_TABLET | Freq: Every day | ORAL | Status: DC
  Administered 2013-01-15 – 2013-01-16 (×2): 100 mg via ORAL
  Filled 2013-01-14 (×2): qty 1

## 2013-01-14 MED ORDER — METFORMIN HCL 500 MG PO TABS
1000.0000 mg | ORAL_TABLET | Freq: Two times a day (BID) | ORAL | Status: DC
  Filled 2013-01-14: qty 2

## 2013-01-14 MED ORDER — DIAZEPAM 5 MG PO TABS
5.0000 mg | ORAL_TABLET | ORAL | Status: AC
  Administered 2013-01-15: 5 mg via ORAL
  Filled 2013-01-14: qty 1

## 2013-01-14 MED ORDER — PANTOPRAZOLE SODIUM 40 MG PO TBEC
40.0000 mg | DELAYED_RELEASE_TABLET | Freq: Every day | ORAL | Status: DC
  Administered 2013-01-15 – 2013-01-16 (×2): 40 mg via ORAL
  Filled 2013-01-14 (×2): qty 1

## 2013-01-14 MED ORDER — SODIUM CHLORIDE 0.9 % IV SOLN
INTRAVENOUS | Status: DC
  Administered 2013-01-15: 04:00:00 via INTRAVENOUS

## 2013-01-14 MED ORDER — DILTIAZEM HCL ER COATED BEADS 240 MG PO CP24: 240.0000 mg | ORAL_CAPSULE | Freq: Every day | ORAL | Status: DC

## 2013-01-14 MED ORDER — ACETAMINOPHEN 650 MG RE SUPP: 650.0000 mg | Freq: Four times a day (QID) | RECTAL | Status: DC | PRN

## 2013-01-14 MED ORDER — FUROSEMIDE 40 MG PO TABS: 40.0000 mg | ORAL_TABLET | Freq: Every day | ORAL | Status: DC

## 2013-01-14 MED ORDER — ASPIRIN 81 MG PO CHEW
81.0000 mg | CHEWABLE_TABLET | ORAL | Status: AC
  Administered 2013-01-15: 81 mg via ORAL
  Filled 2013-01-14: qty 1

## 2013-01-14 MED ORDER — HYDROCODONE-ACETAMINOPHEN 5-325 MG PO TABS
1.0000 | ORAL_TABLET | ORAL | Status: DC | PRN
  Administered 2013-01-15 (×4): 2 via ORAL
  Filled 2013-01-14 (×4): qty 2

## 2013-01-14 MED ORDER — SODIUM CHLORIDE 0.9 % IJ SOLN
3.0000 mL | Freq: Two times a day (BID) | INTRAMUSCULAR | Status: DC
  Administered 2013-01-14 – 2013-01-15 (×2): 3 mL via INTRAVENOUS

## 2013-01-14 MED ORDER — SIMVASTATIN 20 MG PO TABS
20.0000 mg | ORAL_TABLET | Freq: Every day | ORAL | Status: DC
  Administered 2013-01-14 – 2013-01-15 (×2): 20 mg via ORAL
  Filled 2013-01-14 (×4): qty 1

## 2013-01-14 MED ORDER — NITROGLYCERIN IN D5W 200-5 MCG/ML-% IV SOLN
10.0000 ug/min | INTRAVENOUS | Status: DC
  Administered 2013-01-14: 10 ug/min via INTRAVENOUS
  Filled 2013-01-14: qty 250

## 2013-01-14 MED ORDER — ACETAMINOPHEN 325 MG PO TABS
650.0000 mg | ORAL_TABLET | Freq: Four times a day (QID) | ORAL | Status: DC | PRN
  Administered 2013-01-15 – 2013-01-16 (×2): 650 mg via ORAL
  Filled 2013-01-14 (×3): qty 2

## 2013-01-14 MED ORDER — ONDANSETRON HCL 4 MG/2ML IJ SOLN
4.0000 mg | Freq: Four times a day (QID) | INTRAMUSCULAR | Status: DC | PRN
  Administered 2013-01-15: 4 mg via INTRAVENOUS
  Filled 2013-01-14: qty 2

## 2013-01-14 MED ORDER — SODIUM CHLORIDE 0.45 % IV SOLN
50.0000 mL/h | INTRAVENOUS | Status: DC
  Administered 2013-01-14: 50 mL/h via INTRAVENOUS

## 2013-01-14 MED ORDER — ONDANSETRON HCL 4 MG PO TABS: 4.0000 mg | ORAL_TABLET | Freq: Four times a day (QID) | ORAL | Status: DC | PRN

## 2013-01-14 MED ORDER — LOSARTAN POTASSIUM 50 MG PO TABS: 100.0000 mg | ORAL_TABLET | Freq: Every day | ORAL | Status: DC

## 2013-01-14 MED ORDER — CLONIDINE HCL 0.1 MG PO TABS
0.1000 mg | ORAL_TABLET | ORAL | Status: DC | PRN
  Filled 2013-01-14: qty 1

## 2013-01-14 MED ORDER — FUROSEMIDE 40 MG PO TABS
40.0000 mg | ORAL_TABLET | Freq: Every day | ORAL | Status: DC
  Administered 2013-01-14 – 2013-01-16 (×2): 40 mg via ORAL
  Filled 2013-01-14 (×4): qty 1

## 2013-01-14 MED ORDER — HEPARIN BOLUS VIA INFUSION
3000.0000 [IU] | Freq: Once | INTRAVENOUS | Status: AC
  Administered 2013-01-14: 3000 [IU] via INTRAVENOUS
  Filled 2013-01-14: qty 3000

## 2013-01-14 MED ORDER — MORPHINE SULFATE 2 MG/ML IJ SOLN: 2.0000 mg | INTRAMUSCULAR | Status: DC | PRN

## 2013-01-14 MED ORDER — INSULIN ASPART 100 UNIT/ML ~~LOC~~ SOLN
0.0000 [IU] | Freq: Three times a day (TID) | SUBCUTANEOUS | Status: DC
  Administered 2013-01-15: 3 [IU] via SUBCUTANEOUS

## 2013-01-14 MED ORDER — SENNA 8.6 MG PO TABS
1.0000 | ORAL_TABLET | Freq: Two times a day (BID) | ORAL | Status: DC
  Administered 2013-01-14 – 2013-01-16 (×4): 8.6 mg via ORAL
  Filled 2013-01-14 (×5): qty 1

## 2013-01-14 NOTE — H&P (Signed)
Carolyn [name] is an 52 y.o. female.   Chief Complaint: Progressive chest pain and weakness HPI: Carolyn [name] presents for continued evaluation of refractory blood pressure. She did have Renal artery u/s 2012 revealing widely patent renal arteries. Her BP has been running 170/100s' Reviewed meds: she has been on bystolic, which she claims did work but she stopped this due to insurance issues and was started on atenolol. When her pressure is high she does have headache.   She does report having exertional chest pain on several occasions but not on a regular basis, but worse in the last week. She also has significant decreased exercise tolerance over the past several days. She has trouble walking from kitchen to bedroom. She admits to having SOB with c/p associated with walking. She admits to left chest pain with radiation to the left arm with walking.  Cardiac Risk: post-menopausal, obese, HTN, Lipids, DM. Negative factors - non smoker, no family history.  With multiple cardiac risk factors and accelerating chest pain, SOB and decreased exercise tolerance she is admitted with unstable angina for risk stratification.   Past Medical History  Diagnosis Date  . Hypertension   . Diabetes mellitus     diet controlled  . GERD (gastroesophageal reflux disease)   . Dyslipidemia     Past Surgical History  Procedure Laterality Date  . Abdominal hysterectomy      left oopherectomy  . Orif tibia & fibula fractures      Left    Family History  Problem Relation Age of Onset  . Alzheimer's disease Mother   . Hypertension Mother   . Emphysema Father   . Cancer Sister     Breast Cancer  . Diabetes Sister   . Diabetes Sister   . Emphysema Sister   . Diabetes Brother   . Thyroid cancer Sister    Social History:  reports that she has never smoked. She has never used smokeless tobacco. She reports that she does not drink alcohol or use illicit drugs. HSG, [employer] 1 year. Married '89. 2 boys-'93,  '94. 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 Marriage-good health   Allergies:  Allergies  Allergen Reactions  . Codeine     Medications Prior to Admission  Medication Sig Dispense Refill  . atenolol (TENORMIN) 100 MG tablet Take 1 tablet (100 mg total) by mouth daily.  90 tablet  0  . losartan (COZAAR) 100 MG tablet TAKE ONE TABLET BY MOUTH EVERY DAY  90 tablet  0  . metFORMIN (GLUCOPHAGE) 1000 MG tablet Take 1 tablet (1,000 mg total) by mouth 2 (two) times daily with a meal.  60 tablet  3  . omeprazole (PRILOSEC) 20 MG capsule TAKE ONE CAPSULE BY MOUTH ONCE DAILY  30 capsule  3  . promethazine (PHENERGAN) 12.5 MG tablet Take 1 tablet (12.5 mg total) by mouth every 8 (eight) hours as needed for nausea.  20 tablet  2  . simvastatin (ZOCOR) 40 MG tablet TAKE ONE TABLET BY MOUTH EVERY DAY AT BEDTIME  90 tablet  0    Review of Systems  Constitutional: Negative for fever and weight loss.  Eyes: Negative.   Respiratory: Positive for shortness of breath. Negative for cough and hemoptysis.   Cardiovascular: Positive for chest pain. Negative for palpitations, orthopnea, claudication, leg swelling and PND.  Gastrointestinal: Negative for heartburn, nausea, vomiting and abdominal pain.  Genitourinary: Negative.   Musculoskeletal: Negative.   Skin: Negative.   Neurological: Positive for weakness and headaches.  Endo/Heme/Allergies: Negative.   Psychiatric/Behavioral: Negative.      Physical Exam  T 96.7  142/96  78  12  Sat 98% RA  Wt 204  Gen'l - obese AA woman in no acute distress but uncomfortable with left chest and arm ache HEENT- C&S clear Cor 2+ radial pulse, no JVD, no carotid bruits, quiet precordium, RRR w/o m/r/g Pulm - normal respirations, Lungs CTAP Abd - obese, BS+, soft Pelvic/rectal - deferred Neuro - A&O x 3, CN II-XII grossly intact Derm - no obvious lesions Assessment/Plan 1. Cardiac - patient with multiple cardiac risk factors who presents with progressive  chest pain, radiation to the left arm, progressive DOE and decreased exercise tolerance. High level of concern for CAD and unstable angina.  Plan Tele admit  EKG  CXR portable  Lab - Troponins, CMet, CBC, A1C  Continue atenolol, Losartan, start heparin IV, ASA  Cardiology consult  2. HTN- difficult to control. Has been r/o for RAS by renal artery U/S in 2012. No symptoms to suggest adrenal disease  Plan Continue BB, ARB, furosemide will be added  Clonidine 0.1 mg q3 prn XBP >170  3. DM -  Lab Results  Component Value Date   HGBA1C 9.1* 11/30/2012   Plan  Continue metformin  Sliding scale insulin  Illene Regulus 01/14/2013, 6:30 PM Call after 7PM   [phone] (c) [phone] (o) [phone]

## 2013-01-14 NOTE — Progress Notes (Signed)
ANTICOAGULATION CONSULT NOTE - Initial Consult  Pharmacy Consult for heparin  Indication: chest pain/ACS  Allergies  Allergen Reactions  . Codeine Nausea And Vomiting    Patient Measurements:   Heparin Dosing Weight: 80kg  Vital Signs: Temp: 98.1 F (36.7 C) (10/02 1830) Temp src: Oral (10/02 1830) BP: 180/105 mmHg (10/02 1830) Pulse Rate: 92 (10/02 1830)  Labs: No results found for this basename: HGB, HCT, PLT, APTT, LABPROT, INR, HEPARINUNFRC, CREATININE, CKTOTAL, CKMB, TROPONINI,  in the last 72 hours  The CrCl is unknown because both a height and weight (above a minimum accepted value) are required for this calculation.   Medical History: Past Medical History  Diagnosis Date  . Hypertension   . Diabetes mellitus     diet controlled  . GERD (gastroesophageal reflux disease)   . Dyslipidemia       Assessment: 51yof admitted with increased chest pain on exertion.  Plan is to be wourked up for ACS.  Will initiate heparin drip on lower end of dosing as her BP tonight 180/100.    Goal of Therapy:  Heparin level 0.3-0.7 units/ml Monitor platelets by anticoagulation protocol: Yes   Plan:  Heparin bolus 3000 uts IV x1 Heparin drip 1000 uts/hr Daily HL, CBC  Leota Sauers Pharm.D. CPP, BCPS Clinical Pharmacist [phone] 01/14/2013 7:52 PM

## 2013-01-14 NOTE — Progress Notes (Signed)
  Subjective:    Patient ID: Carolyn [name], female    DOB: [birth year], 52 y.o.   MRN: [id number]  HPI Carolyn [name] presents for continued evaluation of refractory blood pressure. She did have Renal artery u/s 2012 revealing widely patent renal arteries. Her BP has been running 170/100s' Reviewed meds: she has been on bystolic, which she claims did work but she stopped this due to insurance issues and was started on atenolol. When her pressure is high she does have headache.   She does report having exertional chest pain on several occasions but not on a regular basis, but worse in the last week. She also has significant decreased exercise tolerance over the past several days. She has trouble walking from kitchen to bedroom. She admits to having SOB with c/p associated with walking. She admits to left chest pain with radiation to the left arm with walking.  Plan is to admit for unstable angina.  Review of Systems     Objective:   Physical Exam        Assessment & Plan:

## 2013-01-14 NOTE — Consult Note (Signed)
CARDIOLOGY CONSULT NOTE   Patient ID: Carolyn [name] MRN: [id number] DOB/AGE: 1960/08/29 52 y.o.  Admit date: 01/14/2013  Primary Physician   Illene Regulus, MD Primary Cardiologist   New Reason for Consultation    Progressive angina  NWG:[id number] [name] is a 52 y.o. female with no history of CAD.  She has been having upper left chest pain. Dr. Debby Bud saw her today because her BP was up. He was concerned about her chest pain and admitted her to the hospital for further evaluation and treatment.  Pt has been having intermittent chest pain x about 1 year. She averages 1 episode per week. It would generally be with exertion, but she has been able to exert herself regularly without difficulty. It was always relieved by rest in about 15 minutes. It would reach a 6/10. It makes her feel odd, rapid heart rate and she can feel her heart pounding in her neck. She has not previously taken her BP while having the pain. The pain makes her SOB, nauseated (she has vomited occasionally, not every time) and diaphoretic. It normally radiates to the back of her shoulder.   Yesterday, she was walking as usual and had onset of her chest pain. It reached a 9/10. It started almost at the beginning of her walk, while it usually starts toward the end. She was more SOB than usual. She quit walking and came in the building. A staff member took her BP, which was very high. The pain resolved in about 30 minutes, lasting longer than usual.   Today, she saw Dr. Debby Bud and was telling him about these problems. She was admitted. Currently, she is having chest pain at a 6/10. There is no tenderness to the area, but the pain is a little worse when she presses her arm forward.   Past Medical History  Diagnosis Date  . Hypertension   . Diabetes mellitus     diet controlled  . GERD (gastroesophageal reflux disease)   . Dyslipidemia      Past Surgical History  Procedure Laterality Date  . Abdominal hysterectomy        left oopherectomy  . Orif tibia & fibula fractures      Left    Allergies  Allergen Reactions  . Codeine Nausea And Vomiting    I have reviewed the patient's current medications . aspirin EC  81 mg Oral Daily  . [START ON 01/15/2013] atenolol  100 mg Oral Daily  . furosemide  40 mg Oral Daily  . heparin  3,000 Units Intravenous Once  . [START ON 01/15/2013] insulin aspart  0-20 Units Subcutaneous TID WC  . [START ON 01/15/2013] losartan  100 mg Oral Daily  . [START ON 01/15/2013] pantoprazole  40 mg Oral Daily  . senna  1 tablet Oral BID  . simvastatin  20 mg Oral q1800  . sodium chloride  3 mL Intravenous Q12H   . sodium chloride    . heparin     acetaminophen, acetaminophen, cloNIDine, HYDROcodone-acetaminophen, ondansetron (ZOFRAN) IV, ondansetron  Medication Sig  atenolol (TENORMIN) 100 MG tablet Take 1 tablet (100 mg total) by mouth daily.  losartan (COZAAR) 100 MG tablet TAKE ONE TABLET BY MOUTH EVERY DAY  metFORMIN (GLUCOPHAGE) 1000 MG tablet Take 1 tablet (1,000 mg total) by mouth 2 (two) times daily with a meal.  omeprazole (PRILOSEC) 20 MG capsule TAKE ONE CAPSULE BY MOUTH ONCE DAILY  promethazine (PHENERGAN) 12.5 MG tablet Take 1 tablet (12.5  mg total) by mouth every 8 (eight) hours as needed for nausea.  simvastatin (ZOCOR) 40 MG tablet TAKE ONE TABLET BY MOUTH EVERY DAY AT BEDTIME  diltiazem (CARTIA XT) 240 MG 24 hr capsule Take 1 capsule (240 mg total) by mouth daily.  furosemide (LASIX) 40 MG tablet Take 1 tablet (40 mg total) by mouth daily.     History   Social History  . Marital Status: Married    Spouse Name: N/A    Number of Children: 2  . Years of Education: 13   Occupational History  . Customer Service     [employer]    Social History Main Topics  . Smoking status: Never Smoker   . Smokeless tobacco: Never Used  . Alcohol Use: No  . Drug Use: No  . Sexual Activity: Not on file   Other Topics Concern  . Not on file   Social History Narrative    HSG, [employer] 1 year. Married '89. 2 boys-'93, '94. 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   Marriage-good health    Family Status  Relation Status Death Age  . Father Deceased 44  . Sister Alive    Family History  Problem Relation Age of Onset  . Alzheimer's disease Mother   . Hypertension Mother   . Emphysema Father   . Cancer Sister     Breast Cancer  . Diabetes Sister   . Diabetes Sister   . Emphysema Sister   . Diabetes Brother   . Thyroid cancer Sister      ROS:  Full 14 point review of systems complete and found to be negative unless listed above.  Physical Exam: Blood pressure 180/105, pulse 92, temperature 98.1 F (36.7 C), temperature source Oral, resp. rate 16, SpO2 98.00%.  General: Well developed, well nourished, female in no acute distress Head: Eyes PERRLA, No xanthomas.   Normocephalic and atraumatic, oropharynx without edema or exudate. Dentition: good Lungs: CTA bilaterally Heart: HRRR S1 S2, no rub/gallop, murmur. pulses are 2+ all 4 extrem.   Neck: No carotid bruits. No lymphadenopathy.  JVD not elevated. Abdomen: Bowel sounds present, abdomen soft and non-tender without masses or hernias noted. Msk:  No spine or cva tenderness. No weakness, no joint deformities or effusions. Extremities: No clubbing or cyanosis. No edema. Upper chest pain is worse with pushing left arm forward. Neuro: Alert and oriented X 3. No focal deficits noted. Psych:  Good affect, responds appropriately Skin: No rashes or lesions noted.  Labs: pending   Lab Results  Component Value Date   CHOL 174 11/30/2012   HDL 42.10 11/30/2012   TRIG 297.0* 11/30/2012    TSH  Date/Time Value Range Status  11/30/2012 11:02 AM 1.64  0.35 - 5.50 uIU/mL Final    ECG:  SR, no ST elevation or Q waves noted.  Radiology:  Portable Chest 1 View 01/14/2013   *RADIOLOGY REPORT*  Clinical Data: Short of breath, angina  PORTABLE CHEST - 1 VIEW  Comparison: Prior chest x-ray 02/23/2009   Findings: Single portable frontal view the chest likely exaggerates the cardiac size, however there is still borderline cardiomegaly. Inspiratory volumes are low.  There is bibasilar atelectasis versus infiltrate.  Mild pulmonary vascular congestion without overt edema.  No pleural effusion or pneumothorax.  No acute osseous abnormality.  IMPRESSION:  1.  Low inspiratory volumes with bibasilar atelectasis versus infiltrate. 2.  Borderline cardiomegaly. 3.  Pulmonary vascular congestion without overt edema.   Original Report Authenticated By: Malachy Moan, M.D.  ASSESSMENT AND PLAN:   The patient was seen today by Dr. Eden Emms, the patient evaluated and the data reviewed.  Principal Problem:   Intermediate coronary syndrome, progressive angina - Agree with admission, cycling enzymes, adding ASA, nitrates and using pain Rx to control pain. Cardiac catheterization is indicated, the risks and benefits of a cardiac catheterization including, but not limited to, death, stroke, MI, kidney damage and bleeding were discussed with the patient who indicates understanding and agrees to proceed.    Active Problems:   Unspecified essential hypertension   Dyslipidemia   DM   Signed: Theodore Demark, PA-C 01/14/2013 7:56 PM Beeper [phone]  Co-Sign MD  Patient examined chart reviewed. Poorly controlled diabetic with symptoms consistent with accelerated angina.  ECG with no acute changes.  Discussed heart catheterization including risks fo bleeding stroke contrast reaction and need for emergency surgery.  Patient willing to proceed. Discussed with Dr Arthur Holms and he will adjust patients BP meds.  Hold metformin for cath  [employer]

## 2013-01-15 ENCOUNTER — Encounter (HOSPITAL_COMMUNITY)
Admission: AD | Disposition: A | Discharge status: Home / Self Care | Payer: Self-pay | Source: Ambulatory Visit | Attending: Internal Medicine

## 2013-01-15 DIAGNOSIS — I739 Peripheral vascular disease, unspecified: Secondary | ICD-10-CM

## 2013-01-15 DIAGNOSIS — E1165 Type 2 diabetes mellitus with hyperglycemia: Secondary | ICD-10-CM

## 2013-01-15 DIAGNOSIS — IMO0002 Reserved for concepts with insufficient information to code with codable children: Secondary | ICD-10-CM

## 2013-01-15 DIAGNOSIS — R079 Chest pain, unspecified: Secondary | ICD-10-CM

## 2013-01-15 HISTORY — PX: LEFT HEART CATHETERIZATION WITH CORONARY ANGIOGRAM: SHX5451

## 2013-01-15 LAB — CBC
Hemoglobin: 9.9 g/dL — ABNORMAL LOW (ref 12.0–15.0)
MCH: 28.7 pg (ref 26.0–34.0)
MCHC: 33.9 g/dL (ref 30.0–36.0)
MCV: 84.6 fL (ref 78.0–100.0)
Platelets: 281 10*3/uL (ref 150–400)

## 2013-01-15 LAB — BASIC METABOLIC PANEL
Chloride: 100 mEq/L (ref 96–112)
Creatinine, Ser: 0.5 mg/dL (ref 0.50–1.10)
GFR calc Af Amer: >90 mL/min (ref >90)
Potassium: 2.9 mEq/L — ABNORMAL LOW (ref 3.5–5.1)

## 2013-01-15 LAB — GLUCOSE, CAPILLARY
Glucose-Capillary: 141 mg/dL — ABNORMAL HIGH (ref 70–99)
Glucose-Capillary: 119 mg/dL — ABNORMAL HIGH (ref 70–99)

## 2013-01-15 LAB — HEPARIN LEVEL (UNFRACTIONATED): Heparin Unfractionated: <0.1 IU/mL — ABNORMAL LOW (ref 0.30–0.70)

## 2013-01-15 LAB — HEMOGLOBIN A1C: Hgb A1c MFr Bld: 7.7 % — ABNORMAL HIGH (ref <5.7)

## 2013-01-15 SURGERY — LEFT HEART CATHETERIZATION WITH CORONARY ANGIOGRAM
Anesthesia: LOCAL

## 2013-01-15 MED ORDER — POTASSIUM CHLORIDE CRYS ER 20 MEQ PO TBCR
40.0000 meq | EXTENDED_RELEASE_TABLET | ORAL | Status: AC
  Administered 2013-01-15: 40 meq via ORAL
  Filled 2013-01-15 (×2): qty 2

## 2013-01-15 MED ORDER — HEPARIN (PORCINE) IN NACL 2-0.9 UNIT/ML-% IJ SOLN
INTRAMUSCULAR | Status: AC
  Filled 2013-01-15: qty 1000

## 2013-01-15 MED ORDER — MIDAZOLAM HCL 2 MG/2ML IJ SOLN
INTRAMUSCULAR | Status: AC
  Filled 2013-01-15: qty 2

## 2013-01-15 MED ORDER — HEPARIN SODIUM (PORCINE) 1000 UNIT/ML IJ SOLN
INTRAMUSCULAR | Status: AC
  Filled 2013-01-15: qty 1

## 2013-01-15 MED ORDER — LIDOCAINE HCL (PF) 1 % IJ SOLN
INTRAMUSCULAR | Status: AC
  Filled 2013-01-15: qty 30

## 2013-01-15 MED ORDER — ONDANSETRON HCL 4 MG/2ML IJ SOLN
INTRAMUSCULAR | Status: AC
  Filled 2013-01-15: qty 2

## 2013-01-15 MED ORDER — LIVING WELL WITH DIABETES BOOK
Freq: Once | Status: AC
  Administered 2013-01-15: 07:00:00
  Filled 2013-01-15: qty 1

## 2013-01-15 MED ORDER — NITROGLYCERIN 0.2 MG/ML ON CALL CATH LAB
INTRAVENOUS | Status: AC
  Filled 2013-01-15: qty 1

## 2013-01-15 MED ORDER — POTASSIUM CHLORIDE IN NACL 20-0.45 MEQ/L-% IV SOLN
INTRAVENOUS | Status: DC
  Administered 2013-01-15 (×2): via INTRAVENOUS
  Filled 2013-01-15 (×4): qty 1000

## 2013-01-15 MED ORDER — LABETALOL HCL 5 MG/ML IV SOLN
INTRAVENOUS | Status: AC
  Filled 2013-01-15: qty 4

## 2013-01-15 MED ORDER — FENTANYL CITRATE 0.05 MG/ML IJ SOLN
INTRAMUSCULAR | Status: AC
  Filled 2013-01-15: qty 2

## 2013-01-15 MED ORDER — VERAPAMIL HCL 2.5 MG/ML IV SOLN
INTRAVENOUS | Status: AC
  Filled 2013-01-15: qty 2

## 2013-01-15 NOTE — Progress Notes (Signed)
Subjective: Appreciate help from cardilogy colleagues. She denies having any chest pain. She is prepared to move ahead with cath.  Objective: Lab:  Recent Labs  01/14/13 1930 01/15/13 0805  WBC 10.6* 9.2  HGB 10.5* 9.9*  HCT 30.6* 29.2*  MCV 84.1 84.6  PLT 303 281    Recent Labs  01/14/13 1930 01/15/13 0805  NA 141 138  K 3.2* 2.9*  CL 101 100  GLUCOSE 130* 128*  BUN 10 9  CREATININE 0.52 0.50  CALCIUM 9.3 8.9  MG 1.5  --    Cardiac Panel (last 3 results)  Recent Labs  01/14/13 1907 01/15/13 0115 01/15/13 0805  TROPONINI <0.30 <0.30 <0.30    Imaging:  Scheduled Meds: . aspirin EC  81 mg Oral Daily  . atenolol  100 mg Oral Daily  . diazepam  5 mg Oral On Call  . furosemide  40 mg Oral Daily  . insulin aspart  0-20 Units Subcutaneous TID WC  . losartan  100 mg Oral Daily  . pantoprazole  40 mg Oral Daily  . potassium chloride  40 mEq Oral Q2H  . senna  1 tablet Oral BID  . simvastatin  20 mg Oral q1800  . sodium chloride  3 mL Intravenous Q12H   Continuous Infusions: . sodium chloride Stopped (01/15/13 0418)  . sodium chloride 100 mL/hr at 01/15/13 0418  . heparin 1,000 Units/hr (01/14/13 2217)  . nitroGLYCERIN 10 mcg/min (01/14/13 2217)   PRN Meds:.acetaminophen, acetaminophen, cloNIDine, HYDROcodone-acetaminophen, morphine injection, ondansetron (ZOFRAN) IV, ondansetron   Physical Exam: Filed Vitals:   01/15/13 0607  BP: 150/84  Pulse: 84  Temp: 98.2 F (36.8 C)  Resp: 20  No intake or output data in the 24 hours ending 01/15/13 1025 Gen'l overweight woman in no distress HEENT- C&S clear Cor- 2+ radial, RRR PUlm - normal respirations      Assessment/Plan: 1. Cardiac - cardiac enzymes negative x3 . For cath today.  2. HTN  BP Readings from Last 3 Encounters:  01/15/13 150/84  01/15/13 150/84  01/14/13 142/96   Better control.  PLan  Continue present meds  3. DM  CBG (last 3)   Recent Labs  01/14/13 2112 01/15/13 0736   GLUCAP 130* 141*    Good control for now Plan Continue sliding scale   Illene Regulus [facility] (o) [phone]; (c) [phone] Call-grp - [facility]  Tele: [phone]  01/15/2013, 10:20 AM

## 2013-01-15 NOTE — Progress Notes (Signed)
Pt still drowsy from procedure, wants to remain in hospital overnight. Notified Ward Givens NP, ok from card standpoint. Dr. Arthur Holms called into room and stated ok for pt to remain in hospital till am. Emelda Brothers RN

## 2013-01-15 NOTE — Interval H&P Note (Signed)
History and Physical Interval Note:  01/15/2013 3:15 PM  Carolyn [name]  has presented today for surgery, with the diagnosis of cp  The various methods of treatment have been discussed with the patient and family. After consideration of risks, benefits and other options for treatment, the patient has consented to  Procedure(s): LEFT HEART CATHETERIZATION WITH CORONARY ANGIOGRAM (N/A) as a surgical intervention .  The patient's history has been reviewed, patient examined, no change in status, stable for surgery.  I have reviewed the patient's chart and labs.  Questions were answered to the patient's satisfaction.    Cath Lab Visit (complete for each Cath Lab visit)  Clinical Evaluation Leading to the Procedure:   ACS: yes  Non-ACS:    Anginal Classification: CCS IV  Anti-ischemic medical therapy: Maximal Therapy (2 or more classes of medications)  Non-Invasive Test Results: No non-invasive testing performed  Prior CABG: No previous CABG         Tonny Bollman

## 2013-01-15 NOTE — H&P (View-Only) (Signed)
Patient ID: Carolyn [name], female   DOB: Aug 12, 1960, 52 y.o.   MRN: [id number]      Subjective:  Denies SSCP, palpitations or Dyspnea Less anxious   Objective:  Filed Vitals:   01/14/13 2006 01/14/13 2111 01/14/13 2207 01/15/13 0607  BP: 167/87  160/89 150/84  Pulse:  87  84  Temp:  97.5 F (36.4 C)  98.2 F (36.8 C)  TempSrc:  Oral  Oral  Resp:    20  SpO2:  100%  95%    Intake/Output from previous day: No intake or output data in the 24 hours ending 01/15/13 4098  Physical Exam: Affect appropriate Healthy:  appears stated age HEENT: normal Neck supple with no adenopathy JVP normal no bruits no thyromegaly Lungs clear with no wheezing and good diaphragmatic motion Heart:  S1/S2 no murmur, no rub, gallop or click PMI normal Abdomen: benighn, BS positve, no tenderness, no AAA no bruit.  No HSM or HJR Distal pulses intact with no bruits No edema Neuro non-focal Skin warm and dry No muscular weakness   Lab Results: Basic Metabolic Panel:  Recent Labs  11/91/47 1930  NA 141  K 3.2*  CL 101  CO2 27  GLUCOSE 130*  BUN 10  CREATININE 0.52  CALCIUM 9.3  MG 1.5   Liver Function Tests:  Recent Labs  01/14/13 1930  AST 13  ALT 18  ALKPHOS 68  BILITOT 0.3  PROT 8.0  ALBUMIN 4.3   CBC:  Recent Labs  01/14/13 1930  WBC 10.6*  HGB 10.5*  HCT 30.6*  MCV 84.1  PLT 303   Cardiac Enzymes:  Recent Labs  01/14/13 1907 01/15/13 0115  TROPONINI <0.30 <0.30   Hemoglobin A1C:  Recent Labs  01/14/13 1930  HGBA1C 7.7*    Imaging: Portable Chest 1 View  01/14/2013   *RADIOLOGY REPORT*  Clinical Data: Short of breath, angina  PORTABLE CHEST - 1 VIEW  Comparison: Prior chest x-ray 02/23/2009  Findings: Single portable frontal view the chest likely exaggerates the cardiac size, however there is still borderline cardiomegaly. Inspiratory volumes are low.  There is bibasilar atelectasis versus infiltrate.  Mild pulmonary vascular congestion without  overt edema.  No pleural effusion or pneumothorax.  No acute osseous abnormality.  IMPRESSION:  1.  Low inspiratory volumes with bibasilar atelectasis versus infiltrate. 2.  Borderline cardiomegaly. 3.  Pulmonary vascular congestion without overt edema.   Original Report Authenticated By: Malachy Moan, M.D.    Cardiac Studies:  ECG:  SR LVH   Telemetry:  NSR no arrhythmia 01/15/2013   Echo:   Medications:   . aspirin EC  81 mg Oral Daily  . atenolol  100 mg Oral Daily  . diazepam  5 mg Oral On Call  . furosemide  40 mg Oral Daily  . insulin aspart  0-20 Units Subcutaneous TID WC  . losartan  100 mg Oral Daily  . pantoprazole  40 mg Oral Daily  . senna  1 tablet Oral BID  . simvastatin  20 mg Oral q1800  . sodium chloride  3 mL Intravenous Q12H     . sodium chloride Stopped (01/15/13 0418)  . sodium chloride 100 mL/hr at 01/15/13 0418  . heparin 1,000 Units/hr (01/14/13 2217)  . nitroGLYCERIN 10 mcg/min (01/14/13 2217)    Assessment/Plan:  HTN:  Continue 3 drug Rx stable this am  Would look at renal arteries during cath given labile HTN  Patent by duplex 4 years ago Chol:  Continue  statin If CAD LDL goal under 70  DM:  Metformin on hold for cath cover with SS insulin  Chest pain:  consistant with angina in poorly controlled diabetic cath latter today  Charlton Haws 01/15/2013, 8:32 AM

## 2013-01-15 NOTE — Progress Notes (Signed)
PHARMACY FOLLOW UP NOTE   Pharmacy Consult for : Heparin Indication: Chest pain on exertion  Dosing Weight: 80 kg   Recent Labs  01/14/13 1930 01/14/13 2047 01/15/13 0805  HGB 10.5*  --  9.9*  HCT 30.6*  --  29.2*  PLT 303  --  281  APTT  --  33  --   LABPROT  --  13.4  --   INR  --  1.04  --   HEPARINUNFRC  --   --  <0.10*  CREATININE 0.52  --  0.50   Lab Results  Component Value Date   INR 1.04 01/14/2013    Estimated Creatinine Clearance: 94 ml/min (by C-G formula based on Cr of 0.5).  Pertinent Medications:  Scheduled:  . aspirin EC  81 mg Oral Daily  . atenolol  100 mg Oral Daily  . diazepam  5 mg Oral On Call  . furosemide  40 mg Oral Daily  . insulin aspart  0-20 Units Subcutaneous TID WC  . losartan  100 mg Oral Daily  . pantoprazole  40 mg Oral Daily  . potassium chloride  40 mEq Oral Q2H  . senna  1 tablet Oral BID  . simvastatin  20 mg Oral q1800  . sodium chloride  3 mL Intravenous Q12H   Infusions:  . sodium chloride Stopped (01/15/13 0418)  . sodium chloride 100 mL/hr at 01/15/13 0418  . heparin 1,000 Units/hr (01/14/13 2217)  . nitroGLYCERIN 10 mcg/min (01/14/13 2217)    Assessment:  52 yo female admitted with increased chest pain on exertion. Patient is being worked up for ACS.  Cardiac Cath scheduled for later today.  Heparin rate 1000 units/hr.  Heparin level < 0.1.  No bleeding complications noted   Goal:  Heparin level 0.3-0.7 units/ml   Plan: 1. Increase Heparin to 1250 units/hr.   2. Follow up after cath, or if delayed, then next Heparin level in 6 hours.   , Carolyn [name].D 01/15/2013, 11:42 AM

## 2013-01-15 NOTE — Progress Notes (Signed)
Patient ID: Carolyn [name], female   DOB: 01/11/1961, 51 y.o.   MRN: [id number]      Subjective:  Denies SSCP, palpitations or Dyspnea Less anxious   Objective:  Filed Vitals:   01/14/13 2006 01/14/13 2111 01/14/13 2207 01/15/13 0607  BP: 167/87  160/89 150/84  Pulse:  87  84  Temp:  97.5 F (36.4 C)  98.2 F (36.8 C)  TempSrc:  Oral  Oral  Resp:    20  SpO2:  100%  95%    Intake/Output from previous day: No intake or output data in the 24 hours ending 01/15/13 0832  Physical Exam: Affect appropriate Healthy:  appears stated age HEENT: normal Neck supple with no adenopathy JVP normal no bruits no thyromegaly Lungs clear with no wheezing and good diaphragmatic motion Heart:  S1/S2 no murmur, no rub, gallop or click PMI normal Abdomen: benighn, BS positve, no tenderness, no AAA no bruit.  No HSM or HJR Distal pulses intact with no bruits No edema Neuro non-focal Skin warm and dry No muscular weakness   Lab Results: Basic Metabolic Panel:  Recent Labs  01/14/13 1930  NA 141  K 3.2*  CL 101  CO2 27  GLUCOSE 130*  BUN 10  CREATININE 0.52  CALCIUM 9.3  MG 1.5   Liver Function Tests:  Recent Labs  01/14/13 1930  AST 13  ALT 18  ALKPHOS 68  BILITOT 0.3  PROT 8.0  ALBUMIN 4.3   CBC:  Recent Labs  01/14/13 1930  WBC 10.6*  HGB 10.5*  HCT 30.6*  MCV 84.1  PLT 303   Cardiac Enzymes:  Recent Labs  01/14/13 1907 01/15/13 0115  TROPONINI <0.30 <0.30   Hemoglobin A1C:  Recent Labs  01/14/13 1930  HGBA1C 7.7*    Imaging: Portable Chest 1 View  01/14/2013   *RADIOLOGY REPORT*  Clinical Data: Short of breath, angina  PORTABLE CHEST - 1 VIEW  Comparison: Prior chest x-ray 02/23/2009  Findings: Single portable frontal view the chest likely exaggerates the cardiac size, however there is still borderline cardiomegaly. Inspiratory volumes are low.  There is bibasilar atelectasis versus infiltrate.  Mild pulmonary vascular congestion without  overt edema.  No pleural effusion or pneumothorax.  No acute osseous abnormality.  IMPRESSION:  1.  Low inspiratory volumes with bibasilar atelectasis versus infiltrate. 2.  Borderline cardiomegaly. 3.  Pulmonary vascular congestion without overt edema.   Original Report Authenticated By: Heath McCullough, M.D.    Cardiac Studies:  ECG:  SR LVH   Telemetry:  NSR no arrhythmia 01/15/2013   Echo:   Medications:   . aspirin EC  81 mg Oral Daily  . atenolol  100 mg Oral Daily  . diazepam  5 mg Oral On Call  . furosemide  40 mg Oral Daily  . insulin aspart  0-20 Units Subcutaneous TID WC  . losartan  100 mg Oral Daily  . pantoprazole  40 mg Oral Daily  . senna  1 tablet Oral BID  . simvastatin  20 mg Oral q1800  . sodium chloride  3 mL Intravenous Q12H     . sodium chloride Stopped (01/15/13 0418)  . sodium chloride 100 mL/hr at 01/15/13 0418  . heparin 1,000 Units/hr (01/14/13 2217)  . nitroGLYCERIN 10 mcg/min (01/14/13 2217)    Assessment/Plan:  HTN:  Continue 3 drug Rx stable this am  Would look at renal arteries during cath given labile HTN  Patent by duplex 4 years ago Chol:  Continue   statin If CAD LDL goal under 70  DM:  Metformin on hold for cath cover with SS insulin  Chest pain:  consistant with angina in poorly controlled diabetic cath latter today    01/15/2013, 8:32 AM     

## 2013-01-15 NOTE — CV Procedure (Signed)
    Cardiac Catheterization Procedure Note  Name: Carolyn [name] MRN: [id number] DOB: 02-04-1961  Procedure: Left Heart Cath, Selective Coronary Angiography, LV angiography, abdominal aortogram  Indication: Chest pain concerning for unstable angina, malignant HTN   Procedural Details: The right wrist was prepped, draped, and anesthetized with 1% lidocaine. Using the modified Seldinger technique, a 5/6 French sheath was introduced into the right radial artery. 3 mg of verapamil was administered through the sheath, weight-based unfractionated heparin was administered intravenously. Standard Judkins catheters were used for selective coronary angiography and left ventriculography. Abdominal aortography was performed with a pigtail catheter in the abdominal aorta. Catheter exchanges were performed over an exchange length guidewire. There were no immediate procedural complications. A TR band was used for radial hemostasis at the completion of the procedure.  The patient was transferred to the post catheterization recovery area for further monitoring.  Procedural Findings: Hemodynamics: AO 169/95 LV 172/24  Coronary angiography: Coronary dominance: right  Left mainstem: Widely patent without obstructive disease. Arise from the left cusp it divides into the LAD and left circumflex.  Left anterior descending (LAD): The LAD reaches the left ventricular apex. Diagonal branches are patent. There are mild luminal irregularities without significant stenoses present.  Left circumflex (LCx): The left circumflex is large in caliber. The obtuse marginal branches are patent. There is no significant disease throughout.  Right coronary artery (RCA): This is a normal caliber, dominant vessel. There is 20-30% proximal stenosis. There is no significant obstructive disease throughout. The PDA is large in caliber without significant disease. The RCA has mild ostial calcification.  Left ventriculography: Left  ventricular systolic function is normal, LVEF is estimated at 55%, there is no significant mitral regurgitation   Abdominal aortogram: The aorta is widely patent. The renal arteries are widely patent bilaterally.  Final Conclusions:   1. 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 Wide patency of the left main, LAD, and left circumflex 2. Normal left ventricular systolic function 3. Widely patent renal arteries  Recommendations: Medical therapy. The patient has no significant obstructive CAD.  Tonny Bollman 01/15/2013, 4:08 PM

## 2013-01-15 NOTE — Progress Notes (Signed)
Utilization review completed.  

## 2013-01-16 LAB — GLUCOSE, CAPILLARY: Glucose-Capillary: 104 mg/dL — ABNORMAL HIGH (ref 70–99)

## 2013-01-16 MED ORDER — CLONIDINE HCL 0.1 MG PO TABS: 0.1000 mg | ORAL_TABLET | ORAL | Status: DC | PRN

## 2013-01-16 MED ORDER — ASPIRIN 81 MG PO TBEC: 81.0000 mg | DELAYED_RELEASE_TABLET | Freq: Every day | ORAL | Status: DC

## 2013-01-16 MED ORDER — OMEPRAZOLE 40 MG PO CPDR: 40.0000 mg | DELAYED_RELEASE_CAPSULE | Freq: Two times a day (BID) | ORAL | Status: DC

## 2013-01-16 NOTE — Plan of Care (Signed)
Problem: Discharge Progression Outcomes Goal: Vascular site scale level 0 - I Vascular Site Scale Level 0: No bruising/bleeding/hematoma Level I (Mild): Bruising/Ecchymosis, minimal bleeding/ooozing, palpable hematoma < 3 cm Level II (Moderate): Bleeding not affecting hemodynamic parameters, pseudoaneurysm, palpable hematoma > 3 cm Level III (Severe) Bleeding which affects hemodynamic parameters or retroperitoneal hemorrhage  Outcome: Completed/Met Date Met:  01/16/13 Right radial level (0) Goal: Activity appropriate for discharge plan Outcome: Completed/Met Date Met:  01/16/13 Up and about in room ambulates independently in room

## 2013-01-16 NOTE — Progress Notes (Signed)
Subjective: Doing much better and feels ready to go home  Objective: Lab:  Recent Labs  01/14/13 1930 01/15/13 0805  WBC 10.6* 9.2  HGB 10.5* 9.9*  HCT 30.6* 29.2*  MCV 84.1 84.6  PLT 303 281    Recent Labs  01/14/13 1930 01/15/13 0805  NA 141 138  K 3.2* 2.9*  CL 101 100  GLUCOSE 130* 128*  BUN 10 9  CREATININE 0.52 0.50  CALCIUM 9.3 8.9  MG 1.5  --     Imaging:  Scheduled Meds: . aspirin EC  81 mg Oral Daily  . atenolol  100 mg Oral Daily  . furosemide  40 mg Oral Daily  . insulin aspart  0-20 Units Subcutaneous TID WC  . losartan  100 mg Oral Daily  . pantoprazole  40 mg Oral Daily  . senna  1 tablet Oral BID  . simvastatin  20 mg Oral q1800  . sodium chloride  3 mL Intravenous Q12H   Continuous Infusions: . 0.45 % NaCl with KCl 20 mEq / L 100 mL/hr at 01/15/13 2313  . sodium chloride Stopped (01/15/13 0418)  . nitroGLYCERIN 10 mcg/min (01/14/13 2217)   PRN Meds:.acetaminophen, acetaminophen, cloNIDine, HYDROcodone-acetaminophen, morphine injection, ondansetron (ZOFRAN) IV, ondansetron   Physical Exam: Filed Vitals:   01/16/13 0507  BP: 134/85  Pulse: 94  Temp: 98.8 F (37.1 C)  Resp: 18   See d/c  summary     Assessment/Plan: For d/c home. See d/c summary dictated # [id number]   Illene Regulus  IM (o) [phone]; (c) [phone] Call-grp - [facility]  Tele: [phone]  01/16/2013, 10:31 AM

## 2013-01-16 NOTE — Discharge Summary (Signed)
Carolyn [name], [place] NO.:  [id number]  MEDICAL RECORD NO.:  [id number]  LOCATION:  [id number]                        FACILITY:  [facility]  PHYSICIAN:  Rosalyn Gess. , MD  DATE OF BIRTH:  [birth year]  DATE OF ADMISSION:  01/14/2013 DATE OF DISCHARGE:  01/16/2013                              DISCHARGE SUMMARY   ADMITTING DIAGNOSIS:  Typical chest pain suggestive of angina.  DISCHARGE DIAGNOSES: 1. Chest pain, noncardiac in origin. 2. Diabetes. 3. Hypertension. 4. Hyperlipidemia.  CONSULTANTS:  Dr. Charlton Haws and Dr. Calton Dach for Cardiology.  PROCEDURES: 1. Chest x-ray, January 14, 2013, which showed low inspiratory volumes.     Borderline cardiomegaly.  Pulmonary vascular congestion without     overt edema. 2. Cardiac procedures:  The patient with a cardiac catheterization.     This revealed minor nonobstructive disease in the RCA.  Wide     patency of the left main, LAD, and left circumflex.  Normal left     ventricular systolic function.  Widely patent renal arteries.  HISTORY OF THE PRESENT ILLNESS:  Ms. Sasaki is a 52 year old woman who presented to the office because of refractory hypertension with blood pressure running in the 170s/100s.  The patient reported having exertional chest pain on several occasions that was accelerating over the past several days.  She reports she had increasing problems with decreased exercise tolerance with dyspnea on exertion with short distance ambulation from kitchen to bedroom.  She admits to having shortness of breath when she had chest pain while walking.  She had left- sided chest pain with radiation to the left arm with walking.  The patient had multiple cardiac risk factors including postmenopausal obesity, hypertension, hyperlipidemia, and diabetes.  Negative factors were she is not a smoker and she has no significant family history.  The patient was admitted because of her typical presentation and high  risk.  Please see the H and P for past medical history, family history, and social history as well as admission physical exam.  HOSPITAL COURSE: 1. The patient was admitted to a telemetry unit.  She was seen in     consultation by the Cardiology Service.  The patient had cardiac     enzymes cycled that were negative x3.  The patient was taken to the     cardiac catheterization lab on the first full day after admission     and she had nonobstructive coronary disease and patent renal     arteries.  With the patient having ruled out by cardiac enzymes and     with a negative cardiac catheterization, she is deemed stable and     ready for discharge home with continued risk management. 2. Diabetes.  The patient's last A1c was January 14, 2013 at 7.7%, down     from 9.1% in August.  The patient was followed with sliding scale     while in hospital.  At discharge, she will resume her home     medications and a very stringent diet with no sugar and low carbs. 3. Hypertension.  The patient had continued excursions of her blood     pressure into significantly high values.  She was treated p.r.n.     with clonidine.  At time of discharge, patient's blood pressure was     134/85.  Plan, the patient's regimen has been changed:  Cardura was     stopped, hydrochlorothiazide stopped; diltiazem CD 240 mg started,     furosemide 40 mg daily started; she will continue on atenolol and     losartan.  The patient is advised to have a home blood pressure     monitor.  She will be provided with clonidine 0.1 mg to take p.r.n.     for systolics greater than 180, diastolics greater than 110.  She is     adamantly instructed to be on a very low salt diet. 4. Hyperlipidemia.  The patient has been doing well.  Last lipid panel     being August, 2014 with a cholesterol of 174, HDL of 42, LDL of     99.8.  She will continue on her home regimen. 5. Typical chest pain.  The patient has ruled out for cardiovascular      origin of her chest pain.  Suspect this may be GI related.  She has     been taking omeprazole 20 mg q.a.m.  Plan, the patient will take     omeprazole 40 mg a.m. and at bedtime.  DISCHARGE EXAMINATION:  VITAL SIGNS:  Temperature is 98.8, blood pressure 134/85, pulse was 94 respirations 18, and oxygen saturation was 93% on room air. GENERAL APPEARANCE:  This is a well-nourished, well-developed, mildly overweight African-American woman, in no acute distress. HEENT:  Conjunctivae and sclerae were clear.  Pupils equal, round, and reactive. NECK:  Supple. CHEST:  Clear with no rales, wheezes, or rhonchi. CARDIOVASCULAR:  Radial pulse 2+ on the left.  The patient has a dressing on the right radial artery where she was cathed.  She has good circulation in her hand which is warm and good capillary refill.  No significant swelling or bruising noted at the wrist.  The patient's heart sounds were regular with no murmurs, rubs, or gallops. NEURO:  The patient is awake, alert.  She is oriented to person, place, time and context.  No further examination conducted.  FINAL LABORATORY DATA:  From October 3, with a sodium of 138, potassium was 2.9, chloride was 100, CO2 of 26, BUN of 9, creatinine 0.5, glucose was 128.  Troponin was less than 0.30 x3.  White count was 9200, hemoglobin 9.9 g, hematocrit was 29.2%, platelets 281,000.  DISCHARGE MEDICATIONS:   Aspirin 81 mg daily.   Atenolol 100 mg daily.   Clonidine 0.1 mg to take p.r.n. for systolic blood pressure greater than 180, diastolic greater than 110 is prescribed. Diltiazem 240 mg once daily.   Furosemide 40 mg p.o. daily.   Cozaar 100 mg daily.   Metformin 1000 mg b.i.d.   Prilosec/omeprazole 40 mg a.m. And at bedtime.   Zocor 40 mg daily.  DISPOSITION:  The patient is discharged to home.  She has been adamantly instructed in regards to diet management of her metabolic problems.  She will be seen in followup in the office either  Wednesday or Thursday of this coming week.  The patient's condition at time of discharge dictation is stable and improved.     Rosalyn Gess , MD     MEN/MEDQ  D:  01/16/2013  T:  01/16/2013  Job:  [id number]  cc:   Veverly Fells. Excell Seltzer, MD

## 2013-01-17 ENCOUNTER — Encounter: Payer: Self-pay | Admitting: Internal Medicine

## 2013-01-19 ENCOUNTER — Ambulatory Visit
Admission: RE | Admit: 2013-01-19 | Discharge: 2013-01-19 | Disposition: A | Discharge status: Home / Self Care | Payer: 59 | Source: Ambulatory Visit

## 2013-01-19 ENCOUNTER — Encounter: Payer: 59 | Attending: Internal Medicine

## 2013-01-19 ENCOUNTER — Ambulatory Visit (INDEPENDENT_AMBULATORY_CARE_PROVIDER_SITE_OTHER): Payer: 59 | Admitting: Internal Medicine

## 2013-01-19 ENCOUNTER — Encounter: Payer: Self-pay | Admitting: Internal Medicine

## 2013-01-19 ENCOUNTER — Other Ambulatory Visit: Payer: Self-pay

## 2013-01-19 VITALS — Ht 65.0 in | Wt 197.7 lb

## 2013-01-19 VITALS — BP 150/90 | HR 71 | Temp 97.7°F | Wt 199.0 lb

## 2013-01-19 DIAGNOSIS — F410 Panic disorder [episodic paroxysmal anxiety] without agoraphobia: Secondary | ICD-10-CM

## 2013-01-19 DIAGNOSIS — I10 Essential (primary) hypertension: Secondary | ICD-10-CM

## 2013-01-19 DIAGNOSIS — Z1231 Encounter for screening mammogram for malignant neoplasm of breast: Secondary | ICD-10-CM

## 2013-01-19 DIAGNOSIS — E785 Hyperlipidemia, unspecified: Secondary | ICD-10-CM

## 2013-01-19 DIAGNOSIS — IMO0002 Reserved for concepts with insufficient information to code with codable children: Secondary | ICD-10-CM | POA: Insufficient documentation

## 2013-01-19 DIAGNOSIS — E1165 Type 2 diabetes mellitus with hyperglycemia: Secondary | ICD-10-CM

## 2013-01-19 DIAGNOSIS — I200 Unstable angina: Secondary | ICD-10-CM

## 2013-01-19 DIAGNOSIS — K219 Gastro-esophageal reflux disease without esophagitis: Secondary | ICD-10-CM

## 2013-01-19 DIAGNOSIS — Z713 Dietary counseling and surveillance: Secondary | ICD-10-CM | POA: Insufficient documentation

## 2013-01-19 NOTE — Assessment & Plan Note (Addendum)
Hospital admission with negative cardiac workup and benign cardiac cath in October 2014 to work up crushing substernal chest pain radiating to left arm associated with SOB and elevated pressures.   Serial BPs: --01/19/13: 150/90 --01/16/13:134/85 --01/15/13:150/84 --01/14/13:142/96 Patient reports pressures in 170s/100s prior to this. Plan Continue current plan of care. Continue to take blood pressures at home. Will return for follow up in 1 month.

## 2013-01-19 NOTE — Assessment & Plan Note (Signed)
Continue simvastatin 40 mg.

## 2013-01-19 NOTE — Assessment & Plan Note (Signed)
Continue current plan of care.

## 2013-01-19 NOTE — Assessment & Plan Note (Signed)
Will d/c metformin and start sitagliptin 100 mg daily.

## 2013-01-19 NOTE — Assessment & Plan Note (Addendum)
01/19/13: Currently taking high dose omeprazole both for reflux symptoms & to r/o GI etiology of crushing chest pain with negative cardiac workup. States that this has improved reflux symptoms.   Plan Continue high dose omeprazole, 40 mg BID until follow-up appointment in 1 month. Will reassess need for high dosage then.
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
## 2013-01-20 ENCOUNTER — Telehealth: Payer: Self-pay | Admitting: General Practice

## 2013-01-20 ENCOUNTER — Telehealth: Payer: Self-pay

## 2013-01-20 NOTE — Telephone Encounter (Signed)
Received notification that patient's Omeprazole has been approved for coverage until 01/20/2014. This information was faxed to [facility] on [address].

## 2013-01-20 NOTE — Telephone Encounter (Signed)
Transitional care call attempted:  LMOVM for patient to call office and speak to Bailey Mech, RN at earliest convenience.

## 2013-01-23 ENCOUNTER — Encounter: Payer: Self-pay | Admitting: Internal Medicine

## 2013-01-26 ENCOUNTER — Encounter: Payer: 59 | Admitting: *Deleted

## 2013-01-26 NOTE — Progress Notes (Addendum)
Patient was seen on 01/19/13 for the first of a series of three diabetes self-management courses at [facility].  Current HbA1c: 7.7%  The following learning objectives were met by the patient during this class:  Describe diabetes  State some common risk factors for diabetes  Defines the role of glucose and insulin  Identifies type of diabetes and pathophysiology  Describe the relationship between diabetes and cardiovascular risk  State the members of the Healthcare Team  States the rationale for glucose monitoring  State when to test glucose  State their individual Target Range  State the importance of logging glucose readings  Describe how to interpret glucose readings  Identifies A1C target  Explain the correlation between A1c and eAG values  State symptoms and treatment of high blood glucose  State symptoms and treatment of low blood glucose  Explain proper technique for glucose testing  Identifies proper sharps disposal  Handouts given during class include:  Living Well with Diabetes book  Carb Counting and Meal Planning book  Meal Plan Card  Carbohydrate guide  Meal planning worksheet  Low Sodium Flavoring Tips  The diabetes portion plate  Low Carbohydrate Snack Suggestions  A1c to eAG Conversion Chart  Diabetes Medications  Stress Management  Diabetes Recommended Care Schedule  Diabetes Success Plan  Core Class Satisfaction Survey  Your patient has identified their diabetes care support plan as:  [facility]  Staff  PCP  Follow-Up Plan:  Attend core 2

## 2013-01-27 NOTE — Progress Notes (Signed)
Patient was seen on 01/26/13 for the second of a series of three diabetes self-management courses at the [facility]. The following learning objectives were met by the patient during this class:   Describe the role of different macronutrients on glucose  Explain how carbohydrates affect blood glucose  State what foods contain the most carbohydrates  Demonstrate carbohydrate counting  Demonstrate how to read Nutrition Facts food label  Describe effects of various fats on heart health  Describe the importance of good nutrition for health and healthy eating strategies  Describe techniques for managing your shopping, cooking and meal planning  List strategies to follow meal plan when dining out  Describe the effects of alcohol on glucose and how to use it safely   Follow-Up Plan:  Attend Core 3  Work towards following your personal food plan.    

## 2013-01-27 NOTE — Progress Notes (Signed)
Patient seen by Dr. Debby Bud as an acute visit.

## 2013-02-02 ENCOUNTER — Encounter: Payer: 59 | Attending: Internal Medicine

## 2013-02-02 ENCOUNTER — Encounter: Payer: Self-pay | Admitting: Internal Medicine

## 2013-02-02 DIAGNOSIS — E1165 Type 2 diabetes mellitus with hyperglycemia: Secondary | ICD-10-CM | POA: Insufficient documentation

## 2013-02-02 DIAGNOSIS — Z713 Dietary counseling and surveillance: Secondary | ICD-10-CM | POA: Insufficient documentation

## 2013-02-02 DIAGNOSIS — IMO0002 Reserved for concepts with insufficient information to code with codable children: Secondary | ICD-10-CM | POA: Insufficient documentation

## 2013-02-03 NOTE — Progress Notes (Signed)
Patient was seen on 02/02/13 for the third of a series of three diabetes self-management courses at the [facility]. The following learning objectives were met by the patient during this class:    State the amount of activity recommended for healthy living   Describe activities suitable for individual needs   Identify ways to regularly incorporate activity into daily life   Identify barriers to activity and ways to over come these barriers  Identify diabetes medications being personally used and their primary action for lowering glucose and possible side effects   Describe role of stress on blood glucose and develop strategies to address psychosocial issues   Identify diabetes complications and ways to prevent them  Explain how to manage diabetes during illness   Evaluate success in meeting personal goal   Establish 2-3 goals that they will plan to diligently work on until they return for the free 79-month follow-up visit  Your patient has established the following 4 month goals in their individualized success plan:  Count Carbohydrates at most meals and snacks and eat less sweets to reduce fat intake  Enjoy walking  To help manage my stress, I will do stress exercises and stay away from things that stress me out  Your patient has identified these potential barriers to change:  My kids eat everything I can't have   Your patient has identified their diabetes self-care support plan as  Eat right  Keep exercising  Don't be ashamed to say that I am sick

## 2013-02-03 NOTE — Progress Notes (Deleted)
Patient was seen on 02/02/13 for the third of a series of three diabetes self-management courses at the [facility]. The following learning objectives were met by the patient during this class:    State the amount of activity recommended for healthy living   Describe activities suitable for individual needs   Identify ways to regularly incorporate activity into daily life   Identify barriers to activity and ways to over come these barriers  Identify diabetes medications being personally used and their primary action for lowering glucose and possible side effects   Describe role of stress on blood glucose and develop strategies to address psychosocial issues   Identify diabetes complications and ways to prevent them  Explain how to manage diabetes during illness   Evaluate success in meeting personal goal   Establish 2-3 goals that they will plan to diligently work on until they return for the free 32-month follow-up visit  Your patient has established the following 4 month goals in their individualized success plan:  Count carbohydrates at most meals and snacks and look at total calories  Increase my activity at lest 3 days a week  Take medication as directed  Test my glucose more frequently than just fasting in AM and look for patterns in my record book  Your patient has identified these potential barriers to change:  Keeping motivation up for exercising  Your patient has identified their diabetes self-care support plan as  Monitor BG at least 2 times a day  Lose 10% of body weight over the next 3 months

## 2013-02-08 ENCOUNTER — Telehealth: Payer: Self-pay | Admitting: Internal Medicine

## 2013-02-08 MED ORDER — LOSARTAN POTASSIUM 100 MG PO TABS: 100.0000 mg | ORAL_TABLET | Freq: Every day | ORAL | Status: DC

## 2013-02-08 NOTE — Telephone Encounter (Signed)
Prescription has been sent.

## 2013-02-08 NOTE — Telephone Encounter (Signed)
Pt request refill for Losartan. Please send to [facility] on [address] if this is ok.

## 2013-02-09 ENCOUNTER — Other Ambulatory Visit: Payer: Self-pay

## 2013-02-09 DIAGNOSIS — I10 Essential (primary) hypertension: Secondary | ICD-10-CM

## 2013-02-09 MED ORDER — SIMVASTATIN 40 MG PO TABS: 40.0000 mg | ORAL_TABLET | Freq: Every day | ORAL | Status: DC

## 2013-02-09 MED ORDER — FUROSEMIDE 40 MG PO TABS: 40.0000 mg | ORAL_TABLET | Freq: Every day | ORAL | Status: DC

## 2013-02-09 MED ORDER — METFORMIN HCL 1000 MG PO TABS: 1000.0000 mg | ORAL_TABLET | Freq: Two times a day (BID) | ORAL | Status: DC

## 2013-02-09 MED ORDER — ATENOLOL 100 MG PO TABS: 100.0000 mg | ORAL_TABLET | Freq: Every day | ORAL | Status: DC

## 2013-02-11 ENCOUNTER — Other Ambulatory Visit: Payer: Self-pay | Admitting: Internal Medicine

## 2013-02-14 DIAGNOSIS — K219 Gastro-esophageal reflux disease without esophagitis: Secondary | ICD-10-CM

## 2013-02-14 DIAGNOSIS — I200 Unstable angina: Secondary | ICD-10-CM

## 2013-02-14 DIAGNOSIS — F410 Panic disorder [episodic paroxysmal anxiety] without agoraphobia: Secondary | ICD-10-CM

## 2013-02-14 DIAGNOSIS — E1165 Type 2 diabetes mellitus with hyperglycemia: Secondary | ICD-10-CM

## 2013-02-14 DIAGNOSIS — E785 Hyperlipidemia, unspecified: Secondary | ICD-10-CM

## 2013-02-14 DIAGNOSIS — I10 Essential (primary) hypertension: Secondary | ICD-10-CM

## 2013-02-15 ENCOUNTER — Encounter: Payer: Self-pay | Admitting: Internal Medicine

## 2013-02-15 ENCOUNTER — Ambulatory Visit (INDEPENDENT_AMBULATORY_CARE_PROVIDER_SITE_OTHER): Payer: 59 | Admitting: Internal Medicine

## 2013-02-15 VITALS — BP 156/96 | HR 78 | Temp 98.0°F | Wt 201.0 lb

## 2013-02-15 DIAGNOSIS — I10 Essential (primary) hypertension: Secondary | ICD-10-CM

## 2013-02-15 DIAGNOSIS — IMO0002 Reserved for concepts with insufficient information to code with codable children: Secondary | ICD-10-CM

## 2013-02-15 DIAGNOSIS — E1165 Type 2 diabetes mellitus with hyperglycemia: Secondary | ICD-10-CM

## 2013-02-15 DIAGNOSIS — E785 Hyperlipidemia, unspecified: Secondary | ICD-10-CM

## 2013-02-15 DIAGNOSIS — F410 Panic disorder [episodic paroxysmal anxiety] without agoraphobia: Secondary | ICD-10-CM

## 2013-02-15 DIAGNOSIS — K219 Gastro-esophageal reflux disease without esophagitis: Secondary | ICD-10-CM
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
## 2013-02-15 NOTE — Patient Instructions (Signed)
1. DM- CBGs 120-150 on Januvia, which she is tolerating better. She has attended nutrition sessions with her husband (the cook) and this has been helpful. Plan Continue present medication and diet  Increase exercise - walking is fine. Check your pulse: target heartrate while walking is 110-120.  2. Blood pressure - a little up today in the office but home reading was good.  Plan Continue present medication.  Get some more out of office readings and let me know. The last one was GREAT  3. GERD - has seen real improvement with AM/PM dosing of PPI. Still has some breakthrough. Plan Continue twice a day medication for another month  After 1 month change to AM, before breakfast dosing.  4. Anxiety - has tried the xanax as needed and it really has helped.  Plan Take as needed. Let me know if you need it more than 3 times a week.   5. Leg pain -  Circulation is ok and there is no palpable abnormality. Plan Be sure you are getting potassium in your diet.

## 2013-02-16 ENCOUNTER — Ambulatory Visit: Payer: 59

## 2013-02-16 NOTE — Assessment & Plan Note (Signed)
She reports good improvement in symptom control on PPI bid therapy with some breakthrough discomfort.  Plan contine BID treatment for 1 month then convert to qAM treatment.

## 2013-02-16 NOTE — Assessment & Plan Note (Signed)
Taking and tolerating Januvia (DPP4) and reports CBGs are better controlled.  Plan Continue present meds  Next A1C in January '15

## 2013-02-16 NOTE — Assessment & Plan Note (Signed)
Anxiety - has tried the xanax as needed and it really has helped.  Plan Take as needed. Let me know if you need it more than 3 times a week.

## 2013-02-16 NOTE — Assessment & Plan Note (Signed)
Taking and tolerating "statin" therapy with good results  Plan Continue present regimen

## 2013-02-16 NOTE — Assessment & Plan Note (Signed)
BP Readings from Last 3 Encounters:  02/15/13 156/96  01/19/13 150/90  01/16/13 154/105   Mild elevation but out of office reading (see MyChart msg) was much better.  Plan Monitor BP and send results as MyChart msg.

## 2013-02-18 ENCOUNTER — Other Ambulatory Visit: Payer: Self-pay

## 2013-02-23 ENCOUNTER — Ambulatory Visit: Payer: 59

## 2013-04-09 ENCOUNTER — Other Ambulatory Visit: Payer: Self-pay | Admitting: *Deleted

## 2013-04-09 MED ORDER — OMEPRAZOLE 40 MG PO CPDR: 40.0000 mg | DELAYED_RELEASE_CAPSULE | Freq: Two times a day (BID) | ORAL | Status: DC

## 2013-05-20 ENCOUNTER — Other Ambulatory Visit: Payer: Self-pay | Admitting: Internal Medicine

## 2013-05-24 ENCOUNTER — Telehealth: Payer: Self-pay | Admitting: Internal Medicine

## 2013-05-24 NOTE — Telephone Encounter (Signed)
Pt called stated that [facility] does not have diltiazem (CARDIZEM CD) 240 MG 24. Please check and follow up with pt.

## 2013-05-24 NOTE — Telephone Encounter (Signed)
This prescription was electronically sent last Thursday #30 plus 5 refills. When I called the pharmacy this morning they were not open so I left a voicemail stating Diltiazem was electronically sent last Thursday and also giving verbal over the phone for refill.

## 2013-05-25 ENCOUNTER — Other Ambulatory Visit: Payer: Self-pay

## 2013-05-25 MED ORDER — DILTIAZEM HCL ER COATED BEADS 240 MG PO CP24: 240.0000 mg | ORAL_CAPSULE | Freq: Every day | ORAL | Status: DC

## 2013-05-27 ENCOUNTER — Telehealth: Payer: Self-pay

## 2013-05-27 NOTE — Telephone Encounter (Signed)
Received a fax from [place] for one touch ultra blue test strips. How often do you want patient to test her blood sugar?

## 2013-05-27 NOTE — Telephone Encounter (Signed)
3 times a week and prn  if she has symptoms to suggest low blood sugar

## 2013-05-28 MED ORDER — GLUCOSE BLOOD VI STRP: ORAL_STRIP | Status: DC

## 2013-05-28 NOTE — Telephone Encounter (Signed)
Test strips have been sent to [facility]

## 2013-06-08 ENCOUNTER — Ambulatory Visit: Payer: 59 | Admitting: *Deleted

## 2013-06-24 ENCOUNTER — Encounter: Payer: Self-pay | Admitting: Internal Medicine

## 2013-06-24 ENCOUNTER — Ambulatory Visit (INDEPENDENT_AMBULATORY_CARE_PROVIDER_SITE_OTHER): Payer: 59 | Admitting: Internal Medicine

## 2013-06-24 VITALS — BP 140/96 | HR 98 | Temp 98.2°F | Wt 201.8 lb

## 2013-06-24 DIAGNOSIS — E118 Type 2 diabetes mellitus with unspecified complications: Principal | ICD-10-CM

## 2013-06-24 DIAGNOSIS — IMO0002 Reserved for concepts with insufficient information to code with codable children: Secondary | ICD-10-CM

## 2013-06-24 DIAGNOSIS — I10 Essential (primary) hypertension: Secondary | ICD-10-CM

## 2013-06-24 DIAGNOSIS — E1165 Type 2 diabetes mellitus with hyperglycemia: Secondary | ICD-10-CM

## 2013-06-24 NOTE — Patient Instructions (Signed)
BP is OK at 140/96.  DM- last A1C 7.7% in October. Repeat is pending. She is on metformin only. She has early satiety but is encouraged to eat multiple small meals a day. Suggestive of gastroparesis.   For continuing care will transfer you to [name]

## 2013-06-24 NOTE — Progress Notes (Signed)
   Subjective:    Patient ID: Carolyn [name], female    DOB: Jul 19, 1960, 53 y.o.   MRN: [id number]  HPI Carolyn [name] presents for follow up and reassignment.   She has been feeling well and has no new complaints. She just had lab at work - results pending and will be reviewed.  Reviewed all meds.  BP is OK at 140/96.  DM- last A1C 7.7% in October. Repeat is pending. She is on metformin only. She has early satiety but is encouraged to eat multiple small meals a day. Suggestive of gastroparesis.    Review of Systems System review is negative for any constitutional, cardiac, pulmonary, GI or neuro symptoms or complaints other than as described in the HPI.     Objective:   Physical Exam Filed Vitals:   06/24/13 1148  BP: 140/96  Pulse: 98  Temp: 98.2 F (36.8 C)   gen'l - WNWD woman in no distress HEENT- C&S clear Cor - RRR Pul - normal respirations Neuro - A&O x3, normal gait and station.          Assessment & Plan:

## 2013-06-24 NOTE — Progress Notes (Signed)
Pre visit review using our clinic review tool, if applicable. No additional management support is needed unless otherwise documented below in the visit note. 

## 2013-06-27 NOTE — Assessment & Plan Note (Signed)
BP Readings from Last 3 Encounters:  06/24/13 140/96  02/15/13 156/96  01/19/13 150/90   On three drugs with clonidine prn. BP today is borderline controlled.   Plan Continue present medications  Continue to monitor BP  For persistent elevation may need to add CCB.

## 2013-06-27 NOTE — Assessment & Plan Note (Signed)
Last A1C above goal. She has resumed metformin.   Plan Recheck A1C with recommendations to follow.

## 2013-06-28 ENCOUNTER — Other Ambulatory Visit: Payer: Self-pay

## 2013-06-28 MED ORDER — OMEPRAZOLE 40 MG PO CPDR: 40.0000 mg | DELAYED_RELEASE_CAPSULE | Freq: Two times a day (BID) | ORAL | Status: DC

## 2013-06-28 MED ORDER — CLONIDINE HCL 0.1 MG PO TABS: 0.1000 mg | ORAL_TABLET | ORAL | Status: DC | PRN

## 2013-06-28 MED ORDER — LOSARTAN POTASSIUM 100 MG PO TABS: 100.0000 mg | ORAL_TABLET | Freq: Every day | ORAL | Status: DC

## 2013-07-05 ENCOUNTER — Ambulatory Visit (INDEPENDENT_AMBULATORY_CARE_PROVIDER_SITE_OTHER): Payer: 59 | Admitting: Internal Medicine

## 2013-07-05 ENCOUNTER — Encounter: Payer: Self-pay | Admitting: Internal Medicine

## 2013-07-05 VITALS — BP 144/88 | HR 90 | Temp 98.7°F | Wt 202.2 lb

## 2013-07-05 DIAGNOSIS — J309 Allergic rhinitis, unspecified: Secondary | ICD-10-CM

## 2013-07-05 MED ORDER — FLUTICASONE PROPIONATE 50 MCG/ACT NA SUSP
2.0000 | Freq: Every day | NASAL | Status: DC
Start: 2013-07-05 — End: 2014-04-25

## 2013-07-05 NOTE — Patient Instructions (Addendum)
Allergic Rhinitis Allergic rhinitis is when the mucous membranes in the nose respond to allergens. Allergens are particles in the air that cause your body to have an allergic reaction. This causes you to release allergic antibodies. Through a chain of events, these eventually cause you to release histamine into the blood stream. Although meant to protect the body, it is this release of histamine that causes your discomfort, such as frequent sneezing, congestion, and an itchy, runny nose.  CAUSES  Seasonal allergic rhinitis (hay fever) is caused by pollen allergens that may come from grasses, trees, and weeds. Year-round allergic rhinitis (perennial allergic rhinitis) is caused by allergens such as house dust mites, pet dander, and mold spores.  SYMPTOMS   Nasal stuffiness (congestion).  Itchy, runny nose with sneezing and tearing of the eyes. DIAGNOSIS  Your health care provider can help you determine the allergen or allergens that trigger your symptoms. If you and your health care provider are unable to determine the allergen, skin or blood testing may be used. TREATMENT  Allergic Rhinitis does not have a cure, but it can be controlled by:  Medicines and allergy shots (immunotherapy).  Avoiding the allergen. Hay fever may often be treated with antihistamines in pill or nasal spray forms. Antihistamines block the effects of histamine. There are over-the-counter medicines that may help with nasal congestion and swelling around the eyes. Check with your health care provider before taking or giving this medicine.  If avoiding the allergen or the medicine prescribed do not work, there are many new medicines your health care provider can prescribe. Stronger medicine may be used if initial measures are ineffective. Desensitizing injections can be used if medicine and avoidance does not work. Desensitization is when a patient is given ongoing shots until the body becomes less sensitive to the allergen.  Make sure you follow up with your health care provider if problems continue. HOME CARE INSTRUCTIONS It is not possible to completely avoid allergens, but you can reduce your symptoms by taking steps to limit your exposure to them. It helps to know exactly what you are allergic to so that you can avoid your specific triggers. SEEK MEDICAL CARE IF:   You have a fever.  You develop a cough that does not stop easily (persistent).  You have shortness of breath.  You start wheezing.  Symptoms interfere with normal daily activities. Document Released: 12/25/2000 Document Revised: 01/20/2013 Document Reviewed: 12/07/2012 Exit[employer] Patient Information 2014 [employer].  
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
## 2013-07-05 NOTE — Progress Notes (Signed)
Pre visit review using our clinic review tool, if applicable. No additional management support is needed unless otherwise documented below in the visit note. 

## 2013-07-30 ENCOUNTER — Other Ambulatory Visit (INDEPENDENT_AMBULATORY_CARE_PROVIDER_SITE_OTHER): Payer: 59

## 2013-07-30 ENCOUNTER — Encounter: Payer: Self-pay | Admitting: Internal Medicine

## 2013-07-30 ENCOUNTER — Ambulatory Visit (INDEPENDENT_AMBULATORY_CARE_PROVIDER_SITE_OTHER): Payer: 59 | Admitting: Internal Medicine

## 2013-07-30 VITALS — BP 160/104 | HR 89 | Temp 98.8°F | Resp 16 | Wt 206.0 lb

## 2013-07-30 DIAGNOSIS — E1165 Type 2 diabetes mellitus with hyperglycemia: Secondary | ICD-10-CM

## 2013-07-30 DIAGNOSIS — IMO0002 Reserved for concepts with insufficient information to code with codable children: Secondary | ICD-10-CM

## 2013-07-30 DIAGNOSIS — E785 Hyperlipidemia, unspecified: Secondary | ICD-10-CM

## 2013-07-30 DIAGNOSIS — I10 Essential (primary) hypertension: Secondary | ICD-10-CM

## 2013-07-30 DIAGNOSIS — R9431 Abnormal electrocardiogram [ECG] [EKG]: Secondary | ICD-10-CM

## 2013-07-30 DIAGNOSIS — D510 Vitamin B12 deficiency anemia due to intrinsic factor deficiency: Secondary | ICD-10-CM

## 2013-07-30 DIAGNOSIS — R0989 Other specified symptoms and signs involving the circulatory and respiratory systems: Secondary | ICD-10-CM

## 2013-07-30 DIAGNOSIS — R011 Cardiac murmur, unspecified: Secondary | ICD-10-CM

## 2013-07-30 DIAGNOSIS — R0683 Snoring: Secondary | ICD-10-CM

## 2013-07-30 DIAGNOSIS — R0609 Other forms of dyspnea: Secondary | ICD-10-CM

## 2013-07-30 DIAGNOSIS — E118 Type 2 diabetes mellitus with unspecified complications: Secondary | ICD-10-CM

## 2013-07-30 DIAGNOSIS — R0600 Dyspnea, unspecified: Secondary | ICD-10-CM

## 2013-07-30 LAB — CBC WITH DIFFERENTIAL/PLATELET
BASOS ABS: 0.1 10*3/uL (ref 0.0–0.1)
Basophils Relative: 1.2 % (ref 0.0–3.0)
EOS ABS: 0.2 10*3/uL (ref 0.0–0.7)
Eosinophils Relative: 2 % (ref 0.0–5.0)
HCT: 36.4 % (ref 36.0–46.0)
Hemoglobin: 12.1 g/dL (ref 12.0–15.0)
LYMPHS PCT: 33.3 % (ref 12.0–46.0)
Lymphs Abs: 3.9 10*3/uL (ref 0.7–4.0)
MCHC: 33.2 g/dL (ref 30.0–36.0)
MCV: 87 fl (ref 78.0–100.0)
Monocytes Absolute: 0.4 10*3/uL (ref 0.1–1.0)
Monocytes Relative: 3.2 % (ref 3.0–12.0)
NEUTROS ABS: 7.1 10*3/uL (ref 1.4–7.7)
Neutrophils Relative %: 60.3 % (ref 43.0–77.0)
Platelets: 341 10*3/uL (ref 150.0–400.0)
RBC: 4.19 Mil/uL (ref 3.87–5.11)
RDW: 14 % (ref 11.5–14.6)
WBC: 11.7 10*3/uL — ABNORMAL HIGH (ref 4.5–10.5)

## 2013-07-30 LAB — URINALYSIS, ROUTINE W REFLEX MICROSCOPIC
Bilirubin Urine: NEGATIVE
Hgb urine dipstick: NEGATIVE
KETONES UR: NEGATIVE
Leukocytes, UA: NEGATIVE
Nitrite: NEGATIVE
PH: 6 (ref 5.0–8.0)
SPECIFIC GRAVITY, URINE: 1.025 (ref 1.000–1.030)
Total Protein, Urine: 100 — AB
URINE GLUCOSE: NEGATIVE
Urobilinogen, UA: 0.2 (ref 0.0–1.0)

## 2013-07-30 LAB — COMPREHENSIVE METABOLIC PANEL
ALBUMIN: 4.5 g/dL (ref 3.5–5.2)
ALT: 21 U/L (ref 0–35)
AST: 21 U/L (ref 0–37)
Alkaline Phosphatase: 65 U/L (ref 39–117)
BUN: 11 mg/dL (ref 6–23)
CALCIUM: 9.7 mg/dL (ref 8.4–10.5)
CO2: 25 mEq/L (ref 19–32)
Chloride: 100 mEq/L (ref 96–112)
Creatinine, Ser: 0.6 mg/dL (ref 0.4–1.2)
GFR: 149.59 mL/min (ref >60.00)
Glucose, Bld: 111 mg/dL — ABNORMAL HIGH (ref 70–99)
Potassium: 3.3 mEq/L — ABNORMAL LOW (ref 3.5–5.1)
SODIUM: 138 meq/L (ref 135–145)
Total Bilirubin: 0.5 mg/dL (ref 0.3–1.2)
Total Protein: 8.5 g/dL — ABNORMAL HIGH (ref 6.0–8.3)

## 2013-07-30 LAB — IBC PANEL
Iron: 48 ug/dL (ref 42–145)
Saturation Ratios: 14.7 % — ABNORMAL LOW (ref 20.0–50.0)
Transferrin: 234 mg/dL (ref 212.0–360.0)

## 2013-07-30 LAB — LIPID PANEL
CHOL/HDL RATIO: 4
Cholesterol: 157 mg/dL (ref 0–200)
HDL: 40.6 mg/dL (ref >39.00)
LDL Cholesterol: 68 mg/dL (ref 0–99)
Triglycerides: 244 mg/dL — ABNORMAL HIGH (ref 0.0–149.0)
VLDL: 48.8 mg/dL — ABNORMAL HIGH (ref 0.0–40.0)

## 2013-07-30 LAB — FERRITIN: FERRITIN: 94.9 ng/mL (ref 10.0–291.0)

## 2013-07-30 LAB — TSH: TSH: 2.76 u[IU]/mL (ref 0.35–5.50)

## 2013-07-30 LAB — FOLATE: Folate: 23.1 ng/mL (ref >5.9)

## 2013-07-30 LAB — VITAMIN B12: Vitamin B-12: 595 pg/mL (ref 211–911)

## 2013-07-30 LAB — HEMOGLOBIN A1C: HEMOGLOBIN A1C: 6.4 % (ref 4.6–6.5)

## 2013-07-30 LAB — BRAIN NATRIURETIC PEPTIDE: PRO B NATRI PEPTIDE: 31 pg/mL (ref 0.0–100.0)

## 2013-07-30 MED ORDER — TRIAMTERENE-HCTZ 37.5-25 MG PO CAPS: 1.0000 | ORAL_CAPSULE | Freq: Every day | ORAL | Status: DC

## 2013-07-30 MED ORDER — CLONIDINE HCL 0.1 MG PO TABS: 0.1000 mg | ORAL_TABLET | Freq: Three times a day (TID) | ORAL | Status: DC

## 2013-07-30 NOTE — Progress Notes (Signed)
Subjective:    Patient ID: Carolyn [name], female    DOB: 02-05-1961, 53 y.o.   MRN: [id number]  Hypertension This is a chronic problem. The current episode started more than 1 year ago. The problem has been gradually worsening since onset. The problem is uncontrolled. Associated symptoms include shortness of breath. Pertinent negatives include no anxiety, blurred vision, chest pain, headaches, malaise/fatigue, neck pain, orthopnea, palpitations, peripheral edema, PND or sweats. Past treatments include beta blockers, calcium channel blockers, central alpha agonists, diuretics and angiotensin blockers. The current treatment provides mild improvement. Compliance problems include diet and exercise.  Identifiable causes of hypertension include sleep apnea.      Review of Systems  Constitutional: Positive for fatigue and unexpected weight change (some weight gain). Negative for fever, chills, malaise/fatigue, diaphoresis, activity change and appetite change.  HENT: Negative.   Eyes: Negative.  Negative for blurred vision.  Respiratory: Positive for apnea and shortness of breath. Negative for cough, choking, chest tightness, wheezing and stridor.   Cardiovascular: Negative.  Negative for chest pain, palpitations, orthopnea, leg swelling and PND.  Gastrointestinal: Negative.  Negative for nausea, vomiting, abdominal pain, diarrhea, constipation and blood in stool.  Endocrine: Negative.  Negative for cold intolerance, heat intolerance, polydipsia, polyphagia and polyuria.  Genitourinary: Negative.   Musculoskeletal: Negative.  Negative for arthralgias, back pain, joint swelling, myalgias and neck pain.  Skin: Negative.   Allergic/Immunologic: Negative.   Neurological: Negative.  Negative for dizziness, tremors, syncope, weakness, light-headedness and headaches.  Hematological: Negative.  Negative for adenopathy. Does not bruise/bleed easily.  Psychiatric/Behavioral: Negative.        Objective:    Physical Exam  Vitals reviewed. Constitutional: She is oriented to person, place, and time. She appears well-developed and well-nourished. No distress.  HENT:  Head: Normocephalic and atraumatic.  Mouth/Throat: Oropharynx is clear and moist. No oropharyngeal exudate.  Eyes: Conjunctivae are normal. Right eye exhibits no discharge. Left eye exhibits no discharge. No scleral icterus.  Neck: Normal range of motion. Neck supple. No JVD present. No tracheal deviation present. No thyromegaly present.  Cardiovascular: Normal rate, regular rhythm, S1 normal, S2 normal and intact distal pulses.  Exam reveals no [name], no S3, no S4, no distant heart sounds and no friction rub.   Murmur heard.  Decrescendo systolic murmur is present with a grade of 1/6  Pulses:      Carotid pulses are 1+ on the right side, and 1+ on the left side.      Radial pulses are 1+ on the right side, and 1+ on the left side.       Femoral pulses are 1+ on the right side, and 1+ on the left side.      Popliteal pulses are 1+ on the right side, and 1+ on the left side.       Dorsalis pedis pulses are 1+ on the right side, and 1+ on the left side.       Posterior tibial pulses are 1+ on the right side, and 1+ on the left side.  Faint SEM over RUSB  Pulmonary/Chest: Effort normal and breath sounds normal. No stridor. No respiratory distress. She has no wheezes. She has no rales. She exhibits no tenderness.  Abdominal: Soft. Bowel sounds are normal. She exhibits no distension and no mass. There is no tenderness. There is no rebound and no guarding.  Musculoskeletal: Normal range of motion. She exhibits no edema and no tenderness.  Lymphadenopathy:    She has no cervical  adenopathy.  Neurological: She is oriented to person, place, and time.  Skin: Skin is warm and dry. No rash noted. She is not diaphoretic. No erythema. No pallor.  Psychiatric: She has a normal mood and affect. Her behavior is normal. Judgment and thought  content normal.    Lab Results  Component Value Date   WBC 9.2 01/15/2013   HGB 9.9* 01/15/2013   HCT 29.2* 01/15/2013   PLT 281 01/15/2013   GLUCOSE 128* 01/15/2013   CHOL 174 11/30/2012   TRIG 297.0* 11/30/2012   HDL 42.10 11/30/2012   LDLDIRECT 99.8 11/30/2012   ALT 18 01/14/2013   AST 13 01/14/2013   NA 138 01/15/2013   K 2.9* 01/15/2013   CL 100 01/15/2013   CREATININE 0.50 01/15/2013   BUN 9 01/15/2013   CO2 26 01/15/2013   TSH 1.64 11/30/2012   INR 1.04 01/14/2013   HGBA1C 7.7* 01/14/2013        Assessment & Plan:

## 2013-07-30 NOTE — Patient Instructions (Signed)

## 2013-07-31 ENCOUNTER — Encounter: Payer: Self-pay | Admitting: Internal Medicine

## 2013-07-31 NOTE — Assessment & Plan Note (Signed)
Her BP is not well controlled and she is symptomatic. She will stay on all the current meds except lasix, will stop that as I think Maxzide is a better choice for her BP control I have asked her to have her snoring elvauated Today I will check her labs to look for end organ damage and secondary causes of HTN

## 2013-07-31 NOTE — Assessment & Plan Note (Signed)
ECHO ordered 

## 2013-07-31 NOTE — Assessment & Plan Note (Signed)
This sounds like AS, ECHO ordered to see how severe it is

## 2013-07-31 NOTE — Assessment & Plan Note (Signed)
This has improved.

## 2013-07-31 NOTE — Assessment & Plan Note (Addendum)
Her EKG shows LVH so I am concerned that she may be developing CHF - will check a BNP today and have ordered an ECHO as well. I will check her labs to look for other causes of SOB as well. Based in her s/s, current EKG and cath report from Oct. 2014 I do not this this is angina.

## 2013-07-31 NOTE — Assessment & Plan Note (Signed)
I have asked her to see sleep medicine to have this evaluated

## 2013-07-31 NOTE — Assessment & Plan Note (Signed)
Her blood sugars are well controlled 

## 2013-08-02 ENCOUNTER — Encounter: Payer: Self-pay | Admitting: Internal Medicine

## 2013-08-02 ENCOUNTER — Telehealth: Payer: Self-pay | Admitting: *Deleted

## 2013-08-02 NOTE — Telephone Encounter (Signed)
She can stop that

## 2013-08-02 NOTE — Telephone Encounter (Signed)
Pt called states she has not been taking Doxazosin 4mg .  She requests whether she is to still be taking medication.  Please advise

## 2013-08-02 NOTE — Telephone Encounter (Signed)
Left detailed message on pts VM. 

## 2013-08-04 ENCOUNTER — Encounter: Payer: Self-pay | Admitting: Internal Medicine

## 2013-08-18 ENCOUNTER — Ambulatory Visit (INDEPENDENT_AMBULATORY_CARE_PROVIDER_SITE_OTHER): Payer: 59 | Admitting: Pulmonary Disease

## 2013-08-18 ENCOUNTER — Ambulatory Visit (HOSPITAL_COMMUNITY): Payer: 59 | Attending: Cardiology | Admitting: Radiology

## 2013-08-18 ENCOUNTER — Encounter: Payer: Self-pay | Admitting: Pulmonary Disease

## 2013-08-18 ENCOUNTER — Telehealth: Payer: Self-pay | Admitting: Internal Medicine

## 2013-08-18 VITALS — BP 124/106 | HR 92 | Ht 64.0 in | Wt 203.0 lb

## 2013-08-18 DIAGNOSIS — R0609 Other forms of dyspnea: Secondary | ICD-10-CM

## 2013-08-18 DIAGNOSIS — I10 Essential (primary) hypertension: Secondary | ICD-10-CM | POA: Insufficient documentation

## 2013-08-18 DIAGNOSIS — R0602 Shortness of breath: Secondary | ICD-10-CM

## 2013-08-18 DIAGNOSIS — R0989 Other specified symptoms and signs involving the circulatory and respiratory systems: Secondary | ICD-10-CM

## 2013-08-18 DIAGNOSIS — R0683 Snoring: Secondary | ICD-10-CM

## 2013-08-18 DIAGNOSIS — E669 Obesity, unspecified: Secondary | ICD-10-CM | POA: Insufficient documentation

## 2013-08-18 DIAGNOSIS — R209 Unspecified disturbances of skin sensation: Secondary | ICD-10-CM

## 2013-08-18 DIAGNOSIS — E119 Type 2 diabetes mellitus without complications: Secondary | ICD-10-CM | POA: Insufficient documentation

## 2013-08-18 DIAGNOSIS — R011 Cardiac murmur, unspecified: Secondary | ICD-10-CM | POA: Insufficient documentation

## 2013-08-18 DIAGNOSIS — R9431 Abnormal electrocardiogram [ECG] [EKG]: Secondary | ICD-10-CM

## 2013-08-18 DIAGNOSIS — R200 Anesthesia of skin: Secondary | ICD-10-CM | POA: Insufficient documentation

## 2013-08-18 NOTE — Telephone Encounter (Signed)
yes

## 2013-08-18 NOTE — Assessment & Plan Note (Signed)
She reports snoring, sleep disruption, apnea, and daytime sleepiness.  She has history of hypertension on multiple medications.  I am concerned she could have sleep apnea.  We discussed how sleep apnea can affect various health problems including risks for hypertension, cardiovascular disease, and diabetes.  We also discussed how sleep disruption can increase risks for accident, such as while driving.  Weight loss as a means of improving sleep apnea was also reviewed.  Additional treatment options discussed were CPAP therapy, oral appliance, and surgical intervention.

## 2013-08-18 NOTE — Progress Notes (Signed)
   Subjective:    Patient ID: Carolyn [name], female    DOB: [birth year], 53 y.o.   MRN: [id number]  HPI    Review of Systems  Constitutional: Negative for fever and unexpected weight change.  HENT: Positive for congestion, rhinorrhea, sinus pressure and sneezing. Negative for dental problem, ear pain, nosebleeds, postnasal drip, sore throat and trouble swallowing.   Eyes: Negative for redness and itching.  Respiratory: Positive for cough, chest tightness, shortness of breath and wheezing.   Cardiovascular: Negative for palpitations and leg swelling.  Gastrointestinal: Negative for nausea and vomiting.  Genitourinary: Negative for dysuria.  Musculoskeletal: Negative for joint swelling.  Skin: Negative for rash.  Neurological: Negative for headaches.  Hematological: Does not bruise/bleed easily.  Psychiatric/Behavioral: Negative for dysphoric mood. The patient is not nervous/anxious.        Objective:   Physical Exam        Assessment & Plan:

## 2013-08-18 NOTE — Assessment & Plan Note (Signed)
She has history of diabetes.  She reports burning and "spider web" feeling in her legs.  Advised her to d/w her PCP.

## 2013-08-18 NOTE — Telephone Encounter (Signed)
Pt needs a refill of Simvastatin 40mg . Is this okay to fill one til her mail order can deliver?

## 2013-08-18 NOTE — Progress Notes (Signed)
Echocardiogram performed.  

## 2013-08-18 NOTE — Progress Notes (Signed)
Chief Complaint  Patient presents with  . Sleep Consult    referred by [name] for snoring. Epworth Score: 9.    History of Present Illness: Carolyn [name] is a 53 y.o. female for evaluation of sleep problems.  She has history of hypertension and is on multiple medications.  She has noticed feeling more tired during the day.  During recent f/u with PCP it was noted she snored.  She wakes up with a choking sensation and feels like her heart is racing at times.  She has to sleep on her side, and her mouth gets dry at night.  She goes to sleep at midnight.  She falls asleep after 45 minutes.  She wakes up two times to use the bathroom.  She gets out of bed at 7 am.  She feels tired in the morning.  She sometimes gets morning headache.  She does not use anything to specifically help her fall sleep, but says that clonidine will make her sleep.  She does not use anything to stay awake.  She gets spider web feeling in her legs >> this happens day or night.  She denies sleep walking, sleep talking, bruxism, or nightmares.  She denies sleep hallucinations, sleep paralysis, or cataplexy.  The Epworth score is 9 out of 24.   Carolyn [name]  has a past medical history of Hypertension; GERD (gastroesophageal reflux disease); Dyslipidemia; Exertional shortness of breath; Type II diabetes mellitus; Migraines; and Anxiety attack.  Carolyn [name]  has past surgical history that includes ORIF tibia & fibula fractures (Left, 2003); Vaginal hysterectomy (~ 2003); Left oophorectomy (Left, ~ 2003); Endometrial ablation (~ 2000-2002); and Cholecystectomy (1990).  Prior to Admission medications   Medication Sig Start Date End Date Taking? Authorizing Provider  aspirin EC 81 MG EC tablet Take 1 tablet (81 mg total) by mouth daily. 01/16/13  Yes Neena Rhymes, MD  atenolol (TENORMIN) 100 MG tablet Take 1 tablet (100 mg total) by mouth daily. 02/09/13  Yes Neena Rhymes, MD  cloNIDine (CATAPRES) 0.1 MG  tablet Take 0.1 mg by mouth daily. 07/30/13  Yes [name], MD  diltiazem (CARDIZEM CD) 240 MG 24 hr capsule Take 1 capsule (240 mg total) by mouth daily. 05/25/13  Yes Neena Rhymes, MD  doxazosin (CARDURA) 4 MG tablet Take 4 mg by mouth at bedtime.   Yes Historical Provider, MD  fluticasone (FLONASE) 50 MCG/ACT nasal spray Place 2 sprays into both nostrils daily. 07/05/13  Yes Webb Silversmith, NP  glucose blood (ONE TOUCH ULTRA TEST) test strip Use to test blood sugar three times a week and prn if having symptoms of low blood sugar Dx: 250.92 90 day supply 05/28/13  Yes Neena Rhymes, MD  losartan (COZAAR) 100 MG tablet Take 1 tablet (100 mg total) by mouth daily. 06/28/13  Yes Neena Rhymes, MD  metFORMIN (GLUCOPHAGE) 1000 MG tablet Take 1 tablet (1,000 mg total) by mouth 2 (two) times daily with a meal. 02/09/13  Yes Neena Rhymes, MD  omeprazole (PRILOSEC) 40 MG capsule Take 1 capsule (40 mg total) by mouth 2 (two) times daily at 8 am and 10 pm. 06/28/13  Yes Neena Rhymes, MD  simvastatin (ZOCOR) 40 MG tablet Take 1 tablet (40 mg total) by mouth at bedtime. 02/09/13  Yes Neena Rhymes, MD  triamterene-hydrochlorothiazide (DYAZIDE) 37.5-25 MG per capsule Take 1 each (1 capsule total) by mouth daily. 07/30/13  Yes [name], MD  Allergies  Allergen Reactions  . Codeine Nausea And Vomiting    Her family history includes Alzheimer's disease in her mother; Cancer in her sister; Diabetes in her brother, sister, and sister; Emphysema in her father and sister; Hypertension in her mother; Thyroid cancer in her sister.  She  reports that she has never smoked. She has never used smokeless tobacco. She reports that she drinks alcohol. She reports that she does not use illicit drugs.   Physical Exam:  General - No distress ENT - No sinus tenderness, no oral exudate, no LAN, no thyromegaly, TM clear, pupils equal/reactive, MP 4, enlarged tongue, high arched palate Cardiac - s1s2  regular, no murmur, pulses symmetric Chest - No wheeze/rales/dullness, good air entry, normal respiratory excursion Back - No focal tenderness Abd - Soft, non-tender, no organomegaly, + bowel sounds Ext - No edema Neuro - Normal strength, cranial nerves intact Skin - No rashes Psych - Normal mood, and behavior  Assessment/plan:  Chesley Mires, M.D. Pager [phone]

## 2013-08-18 NOTE — Patient Instructions (Signed)
Will arrange for sleep study Will call to arrange for follow up after sleep study reviewed 

## 2013-08-18 NOTE — Telephone Encounter (Signed)
Dr. Gery Pray the refill for the Simvastatin.  Pt informed Pharmacy called.

## 2013-08-19 ENCOUNTER — Encounter: Payer: Self-pay | Admitting: Internal Medicine

## 2013-09-13 ENCOUNTER — Ambulatory Visit (HOSPITAL_BASED_OUTPATIENT_CLINIC_OR_DEPARTMENT_OTHER): Payer: 59 | Attending: Pulmonary Disease

## 2013-09-13 VITALS — Ht 64.75 in | Wt 201.0 lb

## 2013-09-13 DIAGNOSIS — R0989 Other specified symptoms and signs involving the circulatory and respiratory systems: Secondary | ICD-10-CM | POA: Insufficient documentation

## 2013-09-13 DIAGNOSIS — I10 Essential (primary) hypertension: Secondary | ICD-10-CM | POA: Insufficient documentation

## 2013-09-13 DIAGNOSIS — Z7982 Long term (current) use of aspirin: Secondary | ICD-10-CM | POA: Insufficient documentation

## 2013-09-13 DIAGNOSIS — G4733 Obstructive sleep apnea (adult) (pediatric): Secondary | ICD-10-CM | POA: Insufficient documentation

## 2013-09-13 DIAGNOSIS — Z79899 Other long term (current) drug therapy: Secondary | ICD-10-CM | POA: Insufficient documentation

## 2013-09-13 DIAGNOSIS — R0683 Snoring: Secondary | ICD-10-CM

## 2013-09-13 DIAGNOSIS — R0609 Other forms of dyspnea: Secondary | ICD-10-CM | POA: Insufficient documentation

## 2013-09-14 ENCOUNTER — Telehealth: Payer: Self-pay | Admitting: Pulmonary Disease

## 2013-09-14 DIAGNOSIS — G4710 Hypersomnia, unspecified: Secondary | ICD-10-CM

## 2013-09-14 DIAGNOSIS — G4730 Sleep apnea, unspecified: Secondary | ICD-10-CM

## 2013-09-14 DIAGNOSIS — G4733 Obstructive sleep apnea (adult) (pediatric): Secondary | ICD-10-CM

## 2013-09-14 NOTE — Telephone Encounter (Signed)
LMOM x 1 

## 2013-09-14 NOTE — Telephone Encounter (Signed)
PSG 09/13/13 >> AHI 3.3, SpO2 low 83%.  Will have my nurse schedule ROV to review results.

## 2013-09-14 NOTE — Sleep Study (Signed)
[place]  NAME: [place] OF BIRTH:  1961/02/24 MEDICAL RECORD NUMBER [id number]  LOCATION: [facility]  PHYSICIAN: Chesley Mires, M.D. DATE OF STUDY: 09/13/2013  SLEEP STUDY TYPE: Polysomnogram               REFERRING PHYSICIAN: Chesley Mires, MD  INDICATION FOR STUDY:  Carolyn [name] is a 53 y.o. female who presents to the sleep lab for evaluation of hypersomnia with obstructive sleep apnea.  She reports snoring, sleep disruption, apnea, and daytime sleepiness.  She has a history of refractory hypertension.  EPWORTH SLEEPINESS SCORE: 7. HEIGHT: 5' 4.75" (164.5 cm)  WEIGHT: 201 lb (91.173 kg)    Body mass index is 33.69 kg/(m^2).  NECK SIZE: 16 in.  MEDICATIONS:  Current Outpatient Prescriptions on File Prior to Visit  Medication Sig Dispense Refill  . aspirin EC 81 MG EC tablet Take 1 tablet (81 mg total) by mouth daily.      Marland Kitchen atenolol (TENORMIN) 100 MG tablet Take 1 tablet (100 mg total) by mouth daily.  90 tablet  3  . cloNIDine (CATAPRES) 0.1 MG tablet Take 0.1 mg by mouth daily.      Marland Kitchen diltiazem (CARDIZEM CD) 240 MG 24 hr capsule Take 1 capsule (240 mg total) by mouth daily.  90 capsule  3  . doxazosin (CARDURA) 4 MG tablet Take 4 mg by mouth at bedtime.      . fluticasone (FLONASE) 50 MCG/ACT nasal spray Place 2 sprays into both nostrils daily.  16 g  6  . glucose blood (ONE TOUCH ULTRA TEST) test strip Use to test blood sugar three times a week and prn if having symptoms of low blood sugar Dx: 250.92 90 day supply  100 each  11  . losartan (COZAAR) 100 MG tablet Take 1 tablet (100 mg total) by mouth daily.  90 tablet  3  . metFORMIN (GLUCOPHAGE) 1000 MG tablet Take 1 tablet (1,000 mg total) by mouth 2 (two) times daily with a meal.  180 tablet  3  . omeprazole (PRILOSEC) 40 MG capsule Take 1 capsule (40 mg total) by mouth 2 (two) times daily at 8 am and 10 pm.  180 capsule  3  . simvastatin (ZOCOR) 40 MG tablet Take 1  tablet (40 mg total) by mouth at bedtime.  90 tablet  3  . triamterene-hydrochlorothiazide (DYAZIDE) 37.5-25 MG per capsule Take 1 each (1 capsule total) by mouth daily.  90 capsule  1   No current facility-administered medications on file prior to visit.    SLEEP ARCHITECTURE:  Total recording time: 379.5 minutes.  Total sleep time was: 258 minutes.  Sleep efficiency: 68%.  Sleep latency: 41 minutes.  REM latency: 288 minutes.  Stage N1: 7.9%.  Stage N2: 84.3%.  Stage N3: 0%.  Stage R:  7.8%.  Supine sleep: 87 minutes.  Non-supine sleep: 171 minutes.  CARDIAC DATA:  Average heart rate: 70 beats per minute. Rhythm strip: sinus rhythm.  RESPIRATORY DATA: Average respiratory rate: 15. Snoring: moderate. Average AHI: 3.3.   Apnea index: 0.  Hypopnea index: 3.3. Obstructive apnea index: 0.  Central apnea index: 0.  Mixed apnea index: 0. REM AHI: 18.  NREM AHI: 2. Supine AHI: 1.. Non-supine AHI: 2.5.  MOVEMENT/PARASOMNIA:  Periodic limb movement: 0.  Period limb movements with arousals: 0. Restroom trips: one.  OXYGEN DATA:  Baseline oxygenation: 98%. Lowest SaO2: 83%. Time spent below SaO2 90%: 2.3 minutes. Supplemental  oxygen used: none.  IMPRESSION/ RECOMMENDATION:   While she did have several respiratory events, most prominently in REM sleep, these were not enough to meet the diagnosis of obstructive sleep apnea.   She should undergo interventions to assist with weight loss.  If she has significant difficulties with her sleep and daytime sleepiness, then she could return to the sleep lab for repeat testing.  Chesley Mires, M.D. Diplomate, [employer] adviser of Sleep Medicine  ELECTRONICALLY SIGNED ON:  09/14/2013, 5:00 PM [place] PH: [phone]   FX: ([phone]) [place]

## 2013-09-15 ENCOUNTER — Ambulatory Visit: Payer: 59 | Admitting: Adult Health

## 2013-09-15 NOTE — Telephone Encounter (Signed)
Can you schedule her for visit with Tammy Parrett.

## 2013-09-15 NOTE — Telephone Encounter (Signed)
Pt is not able to leave work for the results.  Pt states it is very hard to get time off.  Asks to speak w/ VS's nurse before 9:30 this morning.  Satira Anis

## 2013-09-15 NOTE — Telephone Encounter (Signed)
Pt returned call.  Set pt up with an appt w/ TP for tomorrow 09/16/13 @ 10:30 AM.  Carolyn [name]

## 2013-09-15 NOTE — Telephone Encounter (Signed)
PT IS RETURNING A CALL NEEDS CALL BEFORE 9:30 [place] [phone]

## 2013-09-15 NOTE — Telephone Encounter (Signed)
ATC pt, NA and VM not set up [facility]

## 2013-09-15 NOTE — Telephone Encounter (Signed)
ATC, NA and VM not set up  Just needs ov with VS to discuss sleep study

## 2013-09-15 NOTE — Telephone Encounter (Signed)
Called spoke with pt. She reports she is not able to get off work and come in for [place]. The days she is off VS is not in office. She wants to know if she can have results over the phone. Please advise VS thanks

## 2013-09-16 ENCOUNTER — Ambulatory Visit (INDEPENDENT_AMBULATORY_CARE_PROVIDER_SITE_OTHER): Payer: 59 | Admitting: Adult Health

## 2013-09-16 ENCOUNTER — Encounter: Payer: Self-pay | Admitting: Adult Health

## 2013-09-16 VITALS — BP 152/90 | HR 67 | Temp 98.2°F | Ht 64.5 in | Wt 201.6 lb

## 2013-09-16 DIAGNOSIS — R0989 Other specified symptoms and signs involving the circulatory and respiratory systems: Secondary | ICD-10-CM

## 2013-09-16 DIAGNOSIS — R0683 Snoring: Secondary | ICD-10-CM

## 2013-09-16 DIAGNOSIS — R0609 Other forms of dyspnea: Secondary | ICD-10-CM

## 2013-09-16 NOTE — Patient Instructions (Signed)
Work on weight loss  Follow up up with our office if sleep regimen or daytime sleepiness  does not improve As needed   Please contact office for sooner follow up if symptoms do not improve or worsen or seek emergency care

## 2013-09-16 NOTE — Assessment & Plan Note (Signed)
Sleep study with no significant OSA , AHI 3.3 , O2 desats <90 ~2.3 min  Therefore does not meet dx of OSA or qualify for o2 .  Advised on wt loss and healthy sleep hygiene  follow up with our office As needed

## 2013-09-16 NOTE — Progress Notes (Signed)
   Subjective:    Patient ID: Carolyn [name], female    DOB: Nov 14, 1960, 53 y.o.   MRN: [id number]  HPI 53 yo female referred for possible OSA evaluation 08/18/13   09/16/2013 Follow up sleep study  Pt underwent sleep study that showed AHI at 3.3 w/ some desaturation w/ O2 <90% , ~2.3 min . Therefore she does not qualify for CPAP or O2.  She says she does not have significant insomnia or daytime sleepiness. Just feels tired a lot and no energy. Blames on meds for HTN.  B/p remains difficult to control . Has ov with endocrinology and cardiology tomorrow.  We discussed her test results and need for wt loss.     Review of Systems Constitutional:   No  weight loss, night sweats,  Fevers, chills,  +fatigue, or  lassitude.  HEENT:   No headaches,  Difficulty swallowing,  Tooth/dental problems, or  Sore throat,                No sneezing, itching, ear ache, nasal congestion, post nasal drip,   CV:  No chest pain,  Orthopnea, PND, swelling in lower extremities, anasarca, dizziness, palpitations, syncope.   GI  No heartburn, indigestion, abdominal pain, nausea, vomiting, diarrhea, change in bowel habits, loss of appetite, bloody stools.   Resp: No shortness of breath with exertion or at rest.  No excess mucus, no productive cough,  No non-productive cough,  No coughing up of blood.  No change in color of mucus.  No wheezing.  No chest wall deformity  Skin: no rash or lesions.  GU: no dysuria, change in color of urine, no urgency or frequency.  No flank pain, no hematuria   MS:  No joint pain or swelling.  No decreased range of motion.  No back pain.  Psych:  No change in mood or affect. No depression or anxiety.  No memory loss.         Objective:   Physical Exam GEN: A/Ox3; pleasant , NAD, obese   HEENT:  [place]/AT,  EACs-clear, TMs-wnl, NOSE-clear, THROAT-clear, no lesions, no postnasal drip or exudate noted.   NECK:  Supple w/ fair ROM; no JVD; normal carotid impulses w/o bruits; no  thyromegaly or nodules palpated; no lymphadenopathy.  RESP  Clear  P & A; w/o, wheezes/ rales/ or rhonchi.no accessory muscle use, no dullness to percussion  CARD:  RRR, no m/r/g  , no peripheral edema, pulses intact, no cyanosis or clubbing.  GI:   Soft & nt; obese  nml bowel sounds; no organomegaly or masses detected.  Musco: Warm bil, no deformities or joint swelling noted.   Neuro: alert, no focal deficits noted.    Skin: Warm, no lesions or rashes         Assessment & Plan:

## 2013-09-17 ENCOUNTER — Ambulatory Visit (INDEPENDENT_AMBULATORY_CARE_PROVIDER_SITE_OTHER): Payer: 59 | Admitting: Internal Medicine

## 2013-09-17 ENCOUNTER — Encounter: Payer: Self-pay | Admitting: Internal Medicine

## 2013-09-17 ENCOUNTER — Ambulatory Visit (INDEPENDENT_AMBULATORY_CARE_PROVIDER_SITE_OTHER): Payer: 59 | Admitting: Cardiovascular Disease

## 2013-09-17 VITALS — BP 132/84 | HR 82 | Temp 98.0°F | Resp 16 | Ht 64.75 in | Wt 199.8 lb

## 2013-09-17 VITALS — BP 152/78 | HR 76 | Ht 64.0 in | Wt 199.8 lb

## 2013-09-17 DIAGNOSIS — R011 Cardiac murmur, unspecified: Secondary | ICD-10-CM

## 2013-09-17 DIAGNOSIS — E118 Type 2 diabetes mellitus with unspecified complications: Secondary | ICD-10-CM

## 2013-09-17 DIAGNOSIS — R931 Abnormal findings on diagnostic imaging of heart and coronary circulation: Secondary | ICD-10-CM | POA: Insufficient documentation

## 2013-09-17 DIAGNOSIS — I200 Unstable angina: Secondary | ICD-10-CM

## 2013-09-17 DIAGNOSIS — IMO0002 Reserved for concepts with insufficient information to code with codable children: Secondary | ICD-10-CM

## 2013-09-17 DIAGNOSIS — E785 Hyperlipidemia, unspecified: Secondary | ICD-10-CM

## 2013-09-17 DIAGNOSIS — I10 Essential (primary) hypertension: Secondary | ICD-10-CM

## 2013-09-17 DIAGNOSIS — E1165 Type 2 diabetes mellitus with hyperglycemia: Secondary | ICD-10-CM

## 2013-09-17 DIAGNOSIS — R9439 Abnormal result of other cardiovascular function study: Secondary | ICD-10-CM

## 2013-09-17 MED ORDER — LABETALOL HCL 300 MG PO TABS: 300.0000 mg | ORAL_TABLET | Freq: Two times a day (BID) | ORAL | Status: DC

## 2013-09-17 MED ORDER — METFORMIN HCL ER 500 MG PO TB24: 1000.0000 mg | ORAL_TABLET | Freq: Every day | ORAL | Status: DC
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
## 2013-09-17 NOTE — Assessment & Plan Note (Signed)
Recent cath with no CAD  Pain likely non cardiac

## 2013-09-17 NOTE — Assessment & Plan Note (Signed)
Cholesterol is at goal.  Continue current dose of statin and diet Rx.  No myalgias or side effects.  F/U  LFT's in 6 months. Lab Results  Component Value Date   LDLCALC 68 07/30/2013

## 2013-09-17 NOTE — Patient Instructions (Signed)
Your physician recommends that you schedule a follow-up appointment in: NEXT AVAILABLE WITH DR [address] [facility] Your physician has recommended you make the following change in your medication:  STOP  ATENOLOL START  LABETALOL  300 MG   TWICE  DAILY

## 2013-09-17 NOTE — Progress Notes (Signed)
Reviewed and agree with assessment/plan. 

## 2013-09-17 NOTE — Assessment & Plan Note (Signed)
Discussed low carb diet.  Target hemoglobin A1c is 6.5 or less.  Continue current medications. Seeing Dr Renne Crigler tomorrow

## 2013-09-17 NOTE — Patient Instructions (Signed)
Please stop the regular Metformin and start Metformin ER 1000 mg 2x a day.  Please return in 1.5 month with your sugar log.   PATIENT INSTRUCTIONS FOR TYPE 2 DIABETES:  DIET AND EXERCISE Diet and exercise is an important part of diabetic treatment.  We recommended aerobic exercise in the form of brisk walking (working between 40-60% of maximal aerobic capacity, similar to brisk walking) for 150 minutes per week (such as 30 minutes five days per week) along with 3 times per week performing 'resistance' training (using various gauge rubber tubes with handles) 5-10 exercises involving the major muscle groups (upper body, lower body and core) performing 10-15 repetitions (or near fatigue) each exercise. Start at half the above goal but build slowly to reach the above goals. If limited by weight, joint pain, or disability, we recommend daily walking in a swimming pool with water up to waist to reduce pressure from joints while allow for adequate exercise.    BLOOD GLUCOSES Monitoring your blood glucoses is important for continued management of your diabetes. Please check your blood glucoses 2-4 times a day: fasting, before meals and at bedtime (you can rotate these measurements - e.g. one day check before the 3 meals, the next day check before 2 of the meals and before bedtime, etc.).   HYPOGLYCEMIA (low blood sugar) Hypoglycemia is usually a reaction to not eating, exercising, or taking too much insulin/ other diabetes drugs.  Symptoms include tremors, sweating, hunger, confusion, headache, etc. Treat IMMEDIATELY with 15 grams of Carbs:   4 glucose tablets    cup regular juice/soda   2 tablespoons raisins   4 teaspoons sugar   1 tablespoon honey Recheck blood glucose in 15 mins and repeat above if still symptomatic/blood glucose <100.  RECOMMENDATIONS TO REDUCE YOUR RISK OF DIABETIC COMPLICATIONS: * Take your prescribed MEDICATION(S) * Follow a DIABETIC diet: Complex carbs, fiber rich foods,  (monounsaturated and polyunsaturated) fats * AVOID saturated/trans fats, high fat foods, >2,300 mg salt per day. * EXERCISE at least 5 times a week for 30 minutes or preferably daily.  * DO NOT SMOKE OR DRINK more than 1 drink a day. * Check your FEET every day. Do not wear tightfitting shoes. Contact us if you develop an ulcer * See your EYE doctor once a year or more if needed * Get a FLU shot once a year * Get a PNEUMONIA vaccine once before and once after age 57 years  GOALS:  * Your Hemoglobin A1c of <7%  * fasting sugars need to be <130 * after meals sugars need to be <180 (2h after you start eating) * Your Systolic BP should be 696 or lower  * Your Diastolic BP should be 80 or lower  * Your HDL (Good Cholesterol) should be 40 or higher  * Your LDL (Bad Cholesterol) should be 100 or lower. * Your Triglycerides should be 150 or lower  * Your Urine microalbumin (kidney function) should be <30 * Your Body Mass Index should be 25 or lower   We will be glad to help you achieve these goals. Our telephone number is: [phone].

## 2013-09-17 NOTE — Assessment & Plan Note (Signed)
Aortic outflow murmur no AS/HOCM or SAM Benign  Follow no need for SBE
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
## 2013-09-17 NOTE — Assessment & Plan Note (Signed)
Not ideally controlled change atenolol to labatolol and continue other meds   F/U with me next available

## 2013-09-17 NOTE — Assessment & Plan Note (Signed)
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

## 2013-09-20 ENCOUNTER — Ambulatory Visit (INDEPENDENT_AMBULATORY_CARE_PROVIDER_SITE_OTHER): Payer: 59 | Admitting: Internal Medicine

## 2013-09-20 ENCOUNTER — Encounter: Payer: Self-pay | Admitting: Internal Medicine

## 2013-09-20 ENCOUNTER — Other Ambulatory Visit (INDEPENDENT_AMBULATORY_CARE_PROVIDER_SITE_OTHER): Payer: 59

## 2013-09-20 VITALS — BP 130/82 | HR 98 | Temp 98.2°F | Wt 203.8 lb

## 2013-09-20 DIAGNOSIS — D649 Anemia, unspecified: Secondary | ICD-10-CM

## 2013-09-20 DIAGNOSIS — E669 Obesity, unspecified: Secondary | ICD-10-CM | POA: Insufficient documentation

## 2013-09-20 DIAGNOSIS — R51 Headache: Secondary | ICD-10-CM

## 2013-09-20 DIAGNOSIS — I10 Essential (primary) hypertension: Secondary | ICD-10-CM

## 2013-09-20 DIAGNOSIS — K219 Gastro-esophageal reflux disease without esophagitis: Secondary | ICD-10-CM

## 2013-09-20 LAB — CBC WITH DIFFERENTIAL/PLATELET
Basophils Absolute: 0 10*3/uL (ref 0.0–0.1)
Basophils Relative: 0.4 % (ref 0.0–3.0)
EOS PCT: 1.3 % (ref 0.0–5.0)
Eosinophils Absolute: 0.1 10*3/uL (ref 0.0–0.7)
HCT: 31.2 % — ABNORMAL LOW (ref 36.0–46.0)
Hemoglobin: 10.3 g/dL — ABNORMAL LOW (ref 12.0–15.0)
LYMPHS ABS: 3.4 10*3/uL (ref 0.7–4.0)
Lymphocytes Relative: 36.9 % (ref 12.0–46.0)
MCHC: 33.1 g/dL (ref 30.0–36.0)
MCV: 86.8 fl (ref 78.0–100.0)
Monocytes Absolute: 0.6 10*3/uL (ref 0.1–1.0)
Monocytes Relative: 6.6 % (ref 3.0–12.0)
NEUTROS PCT: 54.8 % (ref 43.0–77.0)
Neutro Abs: 5.1 10*3/uL (ref 1.4–7.7)
PLATELETS: 297 10*3/uL (ref 150.0–400.0)
RBC: 3.6 Mil/uL — ABNORMAL LOW (ref 3.87–5.11)
RDW: 14.1 % (ref 11.5–15.5)
WBC: 9.2 10*3/uL (ref 4.0–10.5)

## 2013-09-20 LAB — VITAMIN B12: Vitamin B-12: 732 pg/mL (ref 211–911)

## 2013-09-20 LAB — IBC PANEL
IRON: 51 ug/dL (ref 42–145)
Saturation Ratios: 16.6 % — ABNORMAL LOW (ref 20.0–50.0)
TRANSFERRIN: 219 mg/dL (ref 212.0–360.0)

## 2013-09-20 MED ORDER — PROPRANOLOL HCL 40 MG PO TABS: 40.0000 mg | ORAL_TABLET | Freq: Three times a day (TID) | ORAL | Status: DC

## 2013-09-20 NOTE — Progress Notes (Signed)
Subjective:    Patient ID: Carolyn [name], female    DOB: 1961/03/02, 53 y.o.   MRN: [id number]  HPI her headache started 3 days ago 09/17/13. She attributed this to starting labetalol on that day. Even with the labetalol her blood pressures ranged  130/90-170/100+..These are described as dull and across the forehead and over the crown. They've now become constant.  Loud noises & light exacerbates the headache.  Applying  a cool cloth and resting bin a dark room do help some.  She does have a past history of migraines but none in the recent past. She states she has not had a migraine for at least 18 months. She previously used nortriptyline for migraines.  Her brother has PMH of migraines as well.  She has some vertigo but this preceded the headaches.  She was recently found to be anemic;  hemoglobin 12.1 hematocrit 36.4. Her cardiologist recommend followup with her PCP for evaluation. She is on omeprazole 40 mg twice a day for severe dyspepsia. She's been taking this dose since October 2014. She's never had an upper endoscopy. She describes watery stool with occasional loose stool Past history does include total  hysterectomy.       Review of Systems  She states that she had some chills on 6/6.  She has intermittent tremor of her hands.  Unexplained weight loss, abdominal pain, dysphagia, melena, rectal bleeding, or persistently small caliber stools are denied.       Objective:   Physical Exam Gen.:  well-nourished in appearance. Alert, appropriate and cooperative throughout exam Obesity present as per CDC Guidelines Appears younger than stated age  Head: Normocephalic without obvious abnormalities Eyes: No corneal or conjunctival inflammation noted. Pupils equal round reactive to light and accommodation. Extraocular motion intact. Ears: External  ear exam reveals no significant lesions or deformities.   Nose: External nasal exam reveals no deformity or inflammation.  Nasal mucosa are pink and moist. No lesions or exudates noted.   Mouth: Oral mucosa and oropharynx reveal no lesions or exudates. Teeth in good repair. Neck: No deformities, masses, or tenderness noted. Range of motion & Thyroid normal Lungs: Normal respiratory effort; chest expands symmetrically. Lungs are clear to auscultation without rales, wheezes, or increased work of breathing. Heart: Normal rate and rhythm. Normal S1 and S2. No gallop, click, or rub. Grade 8/1-0 systolic murmur. Abdomen: protuberant. Bowel sounds normal; abdomen soft and nontender. No masses, organomegaly or hernias noted.                                 Musculoskeletal/extremities: No deformity or scoliosis noted of  the thoracic or lumbar spine.  No clubbing, cyanosis, edema, or significant extremity  deformity noted. Range of motion normal .Tone & strength normal. Hand joints normal Fingernail health good. Able to lie down & sit up w/o help. Negative SLR bilaterally Vascular: Carotid, radial artery, dorsalis pedis and  posterior tibial pulses are full and equal. No bruits present. Neurologic: Alert and oriented x3. Deep tendon reflexes symmetrical and normal.  Gait normal .       Skin: Intact without suspicious lesions or rashes. Lymph: No cervical, axillary lymphadenopathy present. Psych: Mood and affect are normal. Normally interactive  Assessment & Plan:  #1 HTN #2 headache #3 anemia #4 GERD  See orders

## 2013-09-20 NOTE — Patient Instructions (Addendum)
Reflux of gastric acid may be asymptomatic as this may occur mainly during sleep.The triggers for reflux  include stress; the "aspirin family" ; alcohol; peppermint; and caffeine (coffee, tea, cola, and chocolate). The aspirin family would include aspirin and the nonsteroidal agents such as ibuprofen &  Naproxen. Tylenol would not cause reflux. If having symptoms ; food & drink should be avoided for @ least 2 hours before going to bed.  Minimal Blood Pressure Goal= AVERAGE < 140/90;  Ideal is an AVERAGE < 135/85. This AVERAGE should be calculated from @ least 5-7 BP readings taken @ different times of day on different days of week. You should not respond to isolated BP readings , but rather the AVERAGE for that week .Please bring your  blood pressure cuff to office visits to verify that it is reliable.It  can also be checked against the blood pressure device at the pharmacy. Finger or wrist cuffs are not dependable; an arm cuff is.   Please keep a diary of your headaches . Document  each occurrence on the calendar with notation of : #1 any prodrome ( any non headache symptom such as marked fatigue,visual changes, ,etc ) which precedes actual headache ; #2) severity on 1-10 scale; #3) any triggers ( food/ drink,enviromenntal or weather changes ,physical or emotional stress) in 8-12 hour period prior to the headache; & #4) response to any medications or other intervention. Please review "Headache" @ [facility] for additional information.

## 2013-09-20 NOTE — Progress Notes (Signed)
Pre visit review using our clinic review tool, if applicable. No additional management support is needed unless otherwise documented below in the visit note. 

## 2013-09-23 ENCOUNTER — Telehealth: Payer: Self-pay

## 2013-09-23 DIAGNOSIS — Z0279 Encounter for issue of other medical certificate: Secondary | ICD-10-CM

## 2013-09-23 NOTE — Telephone Encounter (Signed)
Pharmacy notified.

## 2013-09-23 NOTE — Telephone Encounter (Signed)
Received a new prescription request for Alprazolam 0.25mg . Medication is not current list. Thanks!

## 2013-09-23 NOTE — Telephone Encounter (Signed)
i do not see this patient

## 2013-10-21 ENCOUNTER — Ambulatory Visit: Payer: 59 | Admitting: Internal Medicine

## 2013-10-23 ENCOUNTER — Ambulatory Visit (INDEPENDENT_AMBULATORY_CARE_PROVIDER_SITE_OTHER): Payer: 59 | Admitting: Family Medicine

## 2013-10-23 ENCOUNTER — Encounter: Payer: Self-pay | Admitting: Family Medicine

## 2013-10-23 VITALS — BP 130/74 | HR 79 | Temp 98.6°F | Wt 202.0 lb

## 2013-10-23 DIAGNOSIS — M5416 Radiculopathy, lumbar region: Secondary | ICD-10-CM | POA: Insufficient documentation

## 2013-10-23 DIAGNOSIS — M542 Cervicalgia: Secondary | ICD-10-CM

## 2013-10-23 MED ORDER — CYCLOBENZAPRINE HCL 10 MG PO TABS: 5.0000 mg | ORAL_TABLET | Freq: Three times a day (TID) | ORAL | Status: DC | PRN

## 2013-10-23 NOTE — Assessment & Plan Note (Signed)
Likely trapezius source with resulting compensation and episodic nerve root compression.   D/w pt.  Would treat with flexeril for now and should resolve.  Gentle ROM and heat also.  She agrees.  No weakness.  F/u prn.

## 2013-10-23 NOTE — Patient Instructions (Signed)
Use the flexeril as needed along with heat and gently stretch.  Take care.  This should gradually get better.

## 2013-10-23 NOTE — Progress Notes (Signed)
Pre visit review using our clinic review tool, if applicable. No additional management support is needed unless otherwise documented below in the visit note.  She lost her voice but it is back nearly to normal today.    Recently she has had R arm and shoulder pain- this is ongoing and chronic.  It will occ flare up and give her discomfort.    L neck pain is more recent.  Had been sleeping on a fold out bed 1 week ago.  The L sided neck pain started right after that.  No weakness.  Waking up with pain in the neck.  Occ L arm pain and some occ numbness in her L hand, like she "hit her funny bone but I didn't do that."  No fevers.    Meds, vitals, and allergies reviewed.   ROS: See HPI.  Otherwise, noncontributory.  GEN: nad, alert and oriented HEENT: mucous membranes moist, TM wnl B, OP wnl NECK: supple w/o LA but L trap ttp and pain at L trap with ROM, no midline pain CV: rrr.  PULM: ctab, no inc wob SKIN: no acute rash S/S wnl for the BUE

## 2013-10-26 ENCOUNTER — Encounter: Payer: Self-pay | Admitting: Family Medicine

## 2013-10-26 ENCOUNTER — Ambulatory Visit (INDEPENDENT_AMBULATORY_CARE_PROVIDER_SITE_OTHER): Payer: 59 | Admitting: Family Medicine

## 2013-10-26 VITALS — BP 122/86 | HR 81 | Temp 98.1°F | Ht 64.75 in | Wt 199.5 lb

## 2013-10-26 DIAGNOSIS — M542 Cervicalgia: Secondary | ICD-10-CM
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
## 2013-10-26 NOTE — Progress Notes (Signed)
Pre visit review using our clinic review tool, if applicable. No additional management support is needed unless otherwise documented below in the visit note. 

## 2013-10-26 NOTE — Patient Instructions (Signed)
-  tylenol 500-1000mg  up to 3 times daily  -heat 15 minutes twice daily  -exercise at least 4 days per week  -schedule a follow up with [name] in 3 weeks

## 2013-10-29 ENCOUNTER — Ambulatory Visit: Payer: 59 | Admitting: Internal Medicine

## 2013-10-29 DIAGNOSIS — Z0289 Encounter for other administrative examinations: Secondary | ICD-10-CM

## 2013-11-08 ENCOUNTER — Encounter: Payer: Self-pay | Admitting: Family Medicine

## 2013-11-08 ENCOUNTER — Ambulatory Visit (INDEPENDENT_AMBULATORY_CARE_PROVIDER_SITE_OTHER): Payer: 59 | Admitting: Family Medicine

## 2013-11-08 ENCOUNTER — Ambulatory Visit (INDEPENDENT_AMBULATORY_CARE_PROVIDER_SITE_OTHER)
Admission: RE | Admit: 2013-11-08 | Discharge: 2013-11-08 | Disposition: A | Discharge status: Home / Self Care | Payer: 59 | Source: Ambulatory Visit | Attending: Family Medicine | Admitting: Family Medicine

## 2013-11-08 VITALS — BP 120/84 | HR 93 | Temp 98.1°F | Wt 200.5 lb

## 2013-11-08 DIAGNOSIS — M542 Cervicalgia: Secondary | ICD-10-CM

## 2013-11-08 MED ORDER — HYDROCODONE-ACETAMINOPHEN 5-325 MG PO TABS: 1.0000 | ORAL_TABLET | Freq: Four times a day (QID) | ORAL | Status: DC | PRN

## 2013-11-08 MED ORDER — IBUPROFEN 600 MG PO TABS: 600.0000 mg | ORAL_TABLET | Freq: Three times a day (TID) | ORAL | Status: DC | PRN

## 2013-11-08 NOTE — Progress Notes (Signed)
Pre visit review using our clinic review tool, if applicable. No additional management support is needed unless otherwise documented below in the visit note.  Prev notes from 10/23/13 and 10/26/13 reviewed.  The L shoulder and trap pain started after sleeping in a different bed. Pain is worse now.  Flexeril didn't help, just made her drowsy.  Taking a hot shower doesn't help at all.  No radicular sx in the L hand, but still with pain near the neck.  Advil alone didn't help at all.  No R sided sx.  B grip still wnl.  No rash.  No pain with ROM at the elbow. Pain with ROM of neck.  If she lays on her R side, it pulls more on the L side and makes the pain worse.    Her mother died recently and I offered my condolences.   Meds, vitals, and allergies reviewed.   ROS: See HPI.  Otherwise, noncontributory.  GEN: nad, alert and oriented  NCAT NECK: supple w/o LA but L trap ttp and pain at L trap with ROM, no midline pain No pain with shoulder ROM unless the L trap is engaged.  No impingement.   SKIN: no acute rash  S/S wnl for the BUE

## 2013-11-08 NOTE — Patient Instructions (Signed)
Go to the lab on the way out.  We'll contact you with your xray report. Take ibuprofen with food and use the hydrocodone if needed.  It can make you drowsy.  I would start with a 1/2 tab.  Gently stretch. Take care.  Glad to see you.

## 2013-11-09 NOTE — Assessment & Plan Note (Signed)
Still likely trapezius source, d/w pt.  No radicular sx on exam today.  No weakness. Trap still ttp.  D/w pt.  Use ibuprofen and hydrocodone prn.  Check plain films today.  See notes on xrays.  She agrees. Cautions given re: meds.

## 2013-11-22 ENCOUNTER — Ambulatory Visit (INDEPENDENT_AMBULATORY_CARE_PROVIDER_SITE_OTHER): Payer: 59 | Admitting: Cardiovascular Disease

## 2013-11-22 ENCOUNTER — Encounter: Payer: Self-pay | Admitting: Cardiovascular Disease

## 2013-11-22 VITALS — BP 154/88 | HR 84 | Ht 64.0 in | Wt 199.0 lb

## 2013-11-22 DIAGNOSIS — I10 Essential (primary) hypertension: Secondary | ICD-10-CM

## 2013-11-22 DIAGNOSIS — E785 Hyperlipidemia, unspecified: Secondary | ICD-10-CM

## 2013-11-22 DIAGNOSIS — R931 Abnormal findings on diagnostic imaging of heart and coronary circulation: Secondary | ICD-10-CM

## 2013-11-22 DIAGNOSIS — R9439 Abnormal result of other cardiovascular function study: Secondary | ICD-10-CM

## 2013-11-22 NOTE — Progress Notes (Signed)
Patient ID: Carolyn [name], female   DOB: Jun 01, 1960, 53 y.o.   MRN: [id number] 53 yo with HTN Echo done 5/6 /15 with EF 50% mild diffuse hypokinesis Seen by pulmonary for ? Sleep apnea but does not qualify for CPAP  She is not happy with her primary MD Does not feel that her BP is well controlled. He has not addressed her anemia and he is "scaring" me  About my heart.  She had cath 10/14 with no CAD Ef was normal No RAS and EDP 14  She is compliant with her meds  Has fatigue malaise and some anxiety  Echo 5/15 Study Conclusions  - Left ventricle: The cavity size was normal. Wall thickness was normal. The estimated ejection fraction was 50%. Diffuse hypokinesis. Doppler parameters are consistent with abnormal left ventricular relaxation (grade 1 diastolic dysfunction). - Aortic valve: There was no stenosis. - Mitral valve: No significant regurgitation. - Right ventricle: The cavity size was normal. Systolic function was normal. - Pulmonary arteries: No complete TR doppler jet so unable to estimate PA systolic pressure. - Inferior vena cava: The vessel was normal in size; the respirophasic diameter changes were in the normal range (= 50%); findings are consistent with normal central venous pressure. Impressions:  - Normal LV size with mild global hypokinesis, EF 50%. Normal RV size and systolic function. No significant valvular abnormalities.  Last visit started on labatolol    ROS: Denies fever, malais, weight loss, blurry vision, decreased visual acuity, cough, sputum, SOB, hemoptysis, pleuritic pain, palpitaitons, heartburn, abdominal pain, melena, lower extremity edema, claudication, or rash.  All other systems reviewed and negative  General: Affect appropriate Healthy:  appears stated age 53: normal Neck supple with no adenopathy JVP normal no bruits no thyromegaly Lungs clear with no wheezing and good diaphragmatic motion Heart:  S1/S2 no murmur, no rub, gallop or  click PMI normal Abdomen: benighn, BS positve, no tenderness, no AAA no bruit.  No HSM or HJR Distal pulses intact with no bruits No edema Neuro non-focal Skin warm and dry No muscular weakness   Current Outpatient Prescriptions  Medication Sig Dispense Refill  . aspirin EC 81 MG EC tablet Take 1 tablet (81 mg total) by mouth daily.      . cloNIDine (CATAPRES) 0.1 MG tablet Take 0.1 mg by mouth daily.      Marland Kitchen diltiazem (CARDIZEM CD) 240 MG 24 hr capsule Take 1 capsule (240 mg total) by mouth daily.  90 capsule  3  . fluticasone (FLONASE) 50 MCG/ACT nasal spray Place 2 sprays into both nostrils daily.  16 g  6  . glucose blood (ONE TOUCH ULTRA TEST) test strip Use to test blood sugar three times a week and prn if having symptoms of low blood sugar Dx: 250.92 90 day supply  100 each  11  . HYDROcodone-acetaminophen (NORCO/VICODIN) 5-325 MG per tablet Take 1 tablet by mouth every 6 (six) hours as needed for severe pain (sedation caution).  30 tablet  0  . ibuprofen (ADVIL,MOTRIN) 600 MG tablet Take 1 tablet (600 mg total) by mouth every 8 (eight) hours as needed (with food.).  30 tablet  0  . losartan (COZAAR) 100 MG tablet Take 1 tablet (100 mg total) by mouth daily.  90 tablet  3  . metFORMIN (GLUCOPHAGE-XR) 500 MG 24 hr tablet Take 2 tablets (1,000 mg total) by mouth daily with breakfast.  120 tablet  3  . omeprazole (PRILOSEC) 40 MG capsule Take 1 capsule (40  mg total) by mouth 2 (two) times daily at 8 am and 10 pm.  180 capsule  3  . propranolol (INDERAL) 40 MG tablet Take 1 tablet (40 mg total) by mouth 3 (three) times daily.  90 tablet  3  . simvastatin (ZOCOR) 40 MG tablet Take 1 tablet (40 mg total) by mouth at bedtime.  90 tablet  3  . triamterene-hydrochlorothiazide (DYAZIDE) 37.5-25 MG per capsule Take 1 each (1 capsule total) by mouth daily.  90 capsule  1   No current facility-administered medications for this visit.    Allergies  Codeine and Flexeril  Electrocardiogram:   SR LVH    Assessment and Plan

## 2013-11-22 NOTE — Assessment & Plan Note (Signed)
Cholesterol is at goal.  Continue current dose of statin and diet Rx.  No myalgias or side effects.  F/U  LFT's in 6 months. Lab Results  Component Value Date   LDLCALC 68 07/30/2013

## 2013-11-22 NOTE — Assessment & Plan Note (Signed)
Reasonable controll she is confused about her meds  Inderal tid listed but her pills show labatolol  Will clarify with [facility] and her other pharmacy

## 2013-11-22 NOTE — Assessment & Plan Note (Addendum)
Euvolemic functional class one f/u echo in a year EF 50-55% no CAD on cath 2014

## 2013-11-22 NOTE — Patient Instructions (Signed)
Your physician wants you to follow-up in:  6 MONTHS WITH DR NISHAN  You will receive a reminder letter in the mail two months in advance. If you don't receive a letter, please call our office to schedule the follow-up appointment. Your physician recommends that you continue on your current medications as directed. Please refer to the Current Medication list given to you today. 

## 2013-12-23 ENCOUNTER — Other Ambulatory Visit: Payer: Self-pay

## 2013-12-23 MED ORDER — SIMVASTATIN 40 MG PO TABS: 40.0000 mg | ORAL_TABLET | Freq: Every day | ORAL | Status: DC

## 2013-12-23 MED ORDER — METFORMIN HCL ER 500 MG PO TB24: 1000.0000 mg | ORAL_TABLET | Freq: Every day | ORAL | Status: DC

## 2013-12-23 MED ORDER — FUROSEMIDE 40 MG PO TABS: 40.0000 mg | ORAL_TABLET | Freq: Every day | ORAL | Status: DC

## 2013-12-23 MED ORDER — ATENOLOL 100 MG PO TABS: 100.0000 mg | ORAL_TABLET | Freq: Every day | ORAL | Status: DC

## 2013-12-29 ENCOUNTER — Encounter: Payer: Self-pay | Admitting: Internal Medicine

## 2013-12-29 ENCOUNTER — Ambulatory Visit (INDEPENDENT_AMBULATORY_CARE_PROVIDER_SITE_OTHER): Payer: 59 | Admitting: Internal Medicine

## 2013-12-29 ENCOUNTER — Other Ambulatory Visit (INDEPENDENT_AMBULATORY_CARE_PROVIDER_SITE_OTHER): Payer: 59

## 2013-12-29 ENCOUNTER — Telehealth: Payer: Self-pay | Admitting: Internal Medicine

## 2013-12-29 VITALS — BP 130/76 | HR 88 | Temp 98.5°F | Resp 16 | Ht 64.0 in | Wt 199.0 lb

## 2013-12-29 DIAGNOSIS — F410 Panic disorder [episodic paroxysmal anxiety] without agoraphobia: Secondary | ICD-10-CM

## 2013-12-29 DIAGNOSIS — IMO0002 Reserved for concepts with insufficient information to code with codable children: Secondary | ICD-10-CM

## 2013-12-29 DIAGNOSIS — D510 Vitamin B12 deficiency anemia due to intrinsic factor deficiency: Secondary | ICD-10-CM

## 2013-12-29 DIAGNOSIS — I10 Essential (primary) hypertension: Secondary | ICD-10-CM

## 2013-12-29 DIAGNOSIS — K219 Gastro-esophageal reflux disease without esophagitis: Secondary | ICD-10-CM

## 2013-12-29 DIAGNOSIS — E1165 Type 2 diabetes mellitus with hyperglycemia: Secondary | ICD-10-CM

## 2013-12-29 DIAGNOSIS — Z0000 Encounter for general adult medical examination without abnormal findings: Secondary | ICD-10-CM

## 2013-12-29 DIAGNOSIS — E118 Type 2 diabetes mellitus with unspecified complications: Secondary | ICD-10-CM

## 2013-12-29 DIAGNOSIS — M5412 Radiculopathy, cervical region: Secondary | ICD-10-CM

## 2013-12-29 LAB — BASIC METABOLIC PANEL
BUN: 22 mg/dL (ref 6–23)
CALCIUM: 10.1 mg/dL (ref 8.4–10.5)
CO2: 24 mEq/L (ref 19–32)
Chloride: 101 mEq/L (ref 96–112)
Creatinine, Ser: 0.9 mg/dL (ref 0.4–1.2)
GFR: 81.46 mL/min (ref >60.00)
GLUCOSE: 154 mg/dL — AB (ref 70–99)
Potassium: 4.2 mEq/L (ref 3.5–5.1)
SODIUM: 137 meq/L (ref 135–145)

## 2013-12-29 LAB — CBC WITH DIFFERENTIAL/PLATELET
Basophils Absolute: 0.1 10*3/uL (ref 0.0–0.1)
Basophils Relative: 0.6 % (ref 0.0–3.0)
EOS ABS: 0.2 10*3/uL (ref 0.0–0.7)
EOS PCT: 2 % (ref 0.0–5.0)
HEMATOCRIT: 31.4 % — AB (ref 36.0–46.0)
Hemoglobin: 10.4 g/dL — ABNORMAL LOW (ref 12.0–15.0)
LYMPHS ABS: 3 10*3/uL (ref 0.7–4.0)
Lymphocytes Relative: 32.1 % (ref 12.0–46.0)
MCHC: 33 g/dL (ref 30.0–36.0)
MCV: 88.2 fl (ref 78.0–100.0)
MONO ABS: 0.5 10*3/uL (ref 0.1–1.0)
Monocytes Relative: 5.2 % (ref 3.0–12.0)
NEUTROS PCT: 60.1 % (ref 43.0–77.0)
Neutro Abs: 5.5 10*3/uL (ref 1.4–7.7)
Platelets: 330 10*3/uL (ref 150.0–400.0)
RBC: 3.56 Mil/uL — ABNORMAL LOW (ref 3.87–5.11)
RDW: 13.8 % (ref 11.5–15.5)
WBC: 9.2 10*3/uL (ref 4.0–10.5)

## 2013-12-29 LAB — HM DIABETES EYE EXAM

## 2013-12-29 LAB — HEMOGLOBIN A1C: HEMOGLOBIN A1C: 6.9 % — AB (ref 4.6–6.5)

## 2013-12-29 NOTE — Progress Notes (Signed)
Subjective:    Patient ID: Carolyn [name], female    DOB: 27-Dec-1960, 53 y.o.   MRN: [id number]  Neck Pain  This is a recurrent problem. Episode onset: for 2 months. The problem occurs intermittently. The problem has been gradually worsening. The pain is associated with nothing. The pain is present in the left side. The quality of the pain is described as stabbing and shooting. The pain is at a severity of 4/10. The pain is moderate. Nothing aggravates the symptoms. The pain is same all the time. Associated symptoms include numbness (in her LUE). Pertinent negatives include no chest pain, fever, headaches, leg pain, pain with swallowing, paresis, photophobia, syncope, tingling, trouble swallowing, visual change, weakness or weight loss. She has tried nothing for the symptoms. The treatment provided no relief.      Review of Systems  Constitutional: Negative.  Negative for fever, chills, weight loss, diaphoresis, appetite change and fatigue.  HENT: Negative.  Negative for trouble swallowing.   Eyes: Negative.  Negative for photophobia.  Respiratory: Negative.  Negative for cough, choking, chest tightness, shortness of breath and stridor.   Cardiovascular: Negative.  Negative for chest pain, palpitations, leg swelling and syncope.  Gastrointestinal: Negative.  Negative for nausea, vomiting, abdominal pain, diarrhea, constipation and blood in stool.  Endocrine: Negative.   Genitourinary: Negative.   Musculoskeletal: Positive for neck pain. Negative for arthralgias, back pain, gait problem, joint swelling, myalgias and neck stiffness.  Skin: Negative.  Negative for rash.  Allergic/Immunologic: Negative.   Neurological: Positive for numbness (in her LUE). Negative for dizziness, tingling, tremors, seizures, syncope, facial asymmetry, speech difficulty, weakness, light-headedness and headaches.  Hematological: Negative.  Negative for adenopathy. Does not bruise/bleed easily.    Psychiatric/Behavioral: Negative.        Objective:   Physical Exam  Vitals reviewed. Constitutional: She is oriented to person, place, and time. She appears well-developed and well-nourished. No distress.  HENT:  Head: Normocephalic and atraumatic.  Mouth/Throat: Oropharynx is clear and moist. No oropharyngeal exudate.  Eyes: Conjunctivae are normal. Right eye exhibits no discharge. Left eye exhibits no discharge. No scleral icterus.  Neck: Normal range of motion. Neck supple. No JVD present. No tracheal deviation present. No thyromegaly present.  Cardiovascular: Normal rate, regular rhythm, normal heart sounds and intact distal pulses.  Exam reveals no gallop and no friction rub.   No murmur heard. Pulmonary/Chest: Effort normal and breath sounds normal. No stridor. No respiratory distress. She has no wheezes. She has no rales. She exhibits no tenderness.  Abdominal: Soft. Bowel sounds are normal. She exhibits no distension and no mass. There is no tenderness. There is no rebound and no guarding.  Genitourinary: Rectum normal, vagina normal and uterus normal. Rectal exam shows no external hemorrhoid, no internal hemorrhoid, no fissure, no mass, no tenderness and anal tone normal. Guaiac negative stool. Pelvic exam was performed with patient supine. Uterus is not deviated, not enlarged, not fixed and not tender. Cervix exhibits no motion tenderness, no discharge and no friability. Right adnexum displays no mass, no tenderness and no fullness. Left adnexum displays no mass, no tenderness and no fullness.  Musculoskeletal: Normal range of motion. She exhibits no edema and no tenderness.       Cervical back: Normal. She exhibits normal range of motion, no tenderness, no bony tenderness, no swelling, no edema, no deformity, no laceration, no pain, no spasm and normal pulse.  Lymphadenopathy:    She has no cervical adenopathy.  Neurological: She is  alert and oriented to person, place, and time.  She has normal reflexes. She displays normal reflexes. No cranial nerve deficit. She exhibits normal muscle tone. Coordination normal.  Skin: Skin is warm and dry. No rash noted. She is not diaphoretic. No erythema. No pallor.     Lab Results  Component Value Date   WBC 9.2 09/20/2013   HGB 10.3* 09/20/2013   HCT 31.2* 09/20/2013   PLT 297.0 09/20/2013   GLUCOSE 111* 07/30/2013   CHOL 157 07/30/2013   TRIG 244.0* 07/30/2013   HDL 40.60 07/30/2013   LDLDIRECT 99.8 11/30/2012   LDLCALC 68 07/30/2013   ALT 21 07/30/2013   AST 21 07/30/2013   NA 138 07/30/2013   K 3.3* 07/30/2013   CL 100 07/30/2013   CREATININE 0.6 07/30/2013   BUN 11 07/30/2013   CO2 25 07/30/2013   TSH 2.76 07/30/2013   INR 1.04 01/14/2013   HGBA1C 6.4 07/30/2013        Assessment & Plan:

## 2013-12-29 NOTE — Telephone Encounter (Signed)
Patient was seen for appointment today and failed to request a referral for a colonoscopy. She also failed to request extended days for FMLA, currently shows 4 appts per 2 mos.

## 2013-12-29 NOTE — Patient Instructions (Signed)

## 2013-12-29 NOTE — Progress Notes (Signed)
Pre visit review using our clinic review tool, if applicable. No additional management support is needed unless otherwise documented below in the visit note. 

## 2013-12-31 ENCOUNTER — Telehealth: Payer: Self-pay | Admitting: Internal Medicine

## 2013-12-31 NOTE — Assessment & Plan Note (Signed)
Her blood sugar is well controlled Lytes and renal function are stable

## 2013-12-31 NOTE — Telephone Encounter (Signed)
Renea call requesting clarification from the assistant concern about Metformin. Please give her a call back.

## 2013-12-31 NOTE — Assessment & Plan Note (Signed)
Her BP is well controlled Her lytes and renal function are stable 

## 2013-12-31 NOTE — Assessment & Plan Note (Signed)
I have ordered an MRI to see if there is nerve impingement, HNP, mass, DDD, etc

## 2014-01-04 ENCOUNTER — Encounter: Payer: Self-pay | Admitting: Internal Medicine

## 2014-01-04 ENCOUNTER — Ambulatory Visit (INDEPENDENT_AMBULATORY_CARE_PROVIDER_SITE_OTHER): Payer: 59 | Admitting: Internal Medicine

## 2014-01-04 VITALS — BP 118/72 | HR 103 | Temp 98.8°F | Wt 199.5 lb

## 2014-01-04 DIAGNOSIS — K5289 Other specified noninfective gastroenteritis and colitis: Secondary | ICD-10-CM

## 2014-01-04 DIAGNOSIS — K529 Noninfective gastroenteritis and colitis, unspecified: Secondary | ICD-10-CM

## 2014-01-04 MED ORDER — DIPHENOXYLATE-ATROPINE 2.5-0.025 MG PO TABS: 1.0000 | ORAL_TABLET | Freq: Four times a day (QID) | ORAL | Status: DC | PRN

## 2014-01-04 MED ORDER — PROMETHAZINE HCL 25 MG/ML IJ SOLN
25.0000 mg | Freq: Once | INTRAMUSCULAR | Status: AC
  Administered 2014-01-04: 25 mg via INTRAMUSCULAR

## 2014-01-04 MED ORDER — ONDANSETRON HCL 4 MG PO TABS: 4.0000 mg | ORAL_TABLET | Freq: Three times a day (TID) | ORAL | Status: DC | PRN

## 2014-01-04 NOTE — Progress Notes (Signed)
Pre visit review using our clinic review tool, if applicable. No additional management support is needed unless otherwise documented below in the visit note. 

## 2014-01-04 NOTE — Patient Instructions (Signed)
You had the nausea shot today  Please take all new medication as prescribed  Please continue all other medications as before, and refills have been done if requested.  Please keep your appointments with your specialists as you may have planned

## 2014-01-04 NOTE — Assessment & Plan Note (Signed)
C/w prob viral illness, for phenergan 25 mg IM now, zofran prn, and lomotil prn, f/u any worsening s/s, will hold on labs and stool cx today, volume seems ok as well

## 2014-01-04 NOTE — Progress Notes (Signed)
Subjective:    Patient ID: Carolyn [name], female    DOB: 1960/10/25, 53 y.o.   MRN: [id number]  HPI  Here with 5 days onset acute n/v, abd pain, and diarrhea, watery, no blood of any kind, no sick contacts she knows of, but several co-workers have been ill with similar; did have temp at home 101 3days ago, and a few chills. Denies urinary symptoms such as dysuria, frequency, urgency, flank pain, hematuria or n/v, fever, chills.  No c diff contact or hx., no recent antibx  No rash or joint pain Past Medical History  Diagnosis Date  . Hypertension   . GERD (gastroesophageal reflux disease)   . Dyslipidemia   . Exertional shortness of breath   . Type II diabetes mellitus   . Migraines     "probably weekly" (01/14/2013)  . Anxiety attack    Past Surgical History  Procedure Laterality Date  . Orif tibia & fibula fractures Left 2003  . Vaginal hysterectomy  ~ 2003  . Left oophorectomy Left ~ 2003  . Endometrial ablation  ~ 2000-2002    "twice" (01/14/2013)  . Cholecystectomy  1990    reports that she has never smoked. She has never used smokeless tobacco. She reports that she does not drink alcohol or use illicit drugs. family history includes Alzheimer's disease in her mother; Cancer in her sister; Diabetes in her brother, sister, and sister; Emphysema in her father and sister; Hypertension in her mother; Thyroid cancer in her sister. Allergies  Allergen Reactions  . Codeine Nausea And Vomiting  . Flexeril [Cyclobenzaprine]     sedation   Current Outpatient Prescriptions on File Prior to Visit  Medication Sig Dispense Refill  . aspirin EC 81 MG EC tablet Take 1 tablet (81 mg total) by mouth daily.      Marland Kitchen atenolol (TENORMIN) 100 MG tablet Take 1 tablet (100 mg total) by mouth daily.  90 tablet  3  . cloNIDine (CATAPRES) 0.1 MG tablet Take 0.2 mg by mouth daily.       Marland Kitchen diltiazem (CARDIZEM CD) 240 MG 24 hr capsule Take 1 capsule (240 mg total) by mouth daily.  90 capsule  3  .  fluticasone (FLONASE) 50 MCG/ACT nasal spray Place 2 sprays into both nostrils daily.  16 g  6  . furosemide (LASIX) 40 MG tablet Take 1 tablet (40 mg total) by mouth daily.  90 tablet  3  . glucose blood (ONE TOUCH ULTRA TEST) test strip Use to test blood sugar three times a week and prn if having symptoms of low blood sugar Dx: 250.92 90 day supply  100 each  11  . ibuprofen (ADVIL,MOTRIN) 600 MG tablet Take 1 tablet (600 mg total) by mouth every 8 (eight) hours as needed (with food.).  30 tablet  0  . labetalol (NORMODYNE) 300 MG tablet Take 300 mg by mouth 2 (two) times daily.      Marland Kitchen losartan (COZAAR) 100 MG tablet Take 1 tablet (100 mg total) by mouth daily.  90 tablet  3  . metFORMIN (GLUCOPHAGE-XR) 500 MG 24 hr tablet Take 2 tablets (1,000 mg total) by mouth daily with breakfast.  120 tablet  3  . omeprazole (PRILOSEC) 40 MG capsule Take 1 capsule (40 mg total) by mouth 2 (two) times daily at 8 am and 10 pm.  180 capsule  3  . simvastatin (ZOCOR) 40 MG tablet Take 1 tablet (40 mg total) by mouth at bedtime.  [place]  tablet  3   No current facility-administered medications on file prior to visit.   Review of Systems All otherwise neg per pt     Objective:   Physical Exam BP 118/72  Pulse 103  Temp(Src) 98.8 F (37.1 C) (Oral)  Wt 199 lb 8 oz (90.493 kg)  SpO2 98% VS noted, mild ill appaering Constitutional: Pt appears well-developed, well-nourished.  HENT: Head: NCAT.  Right Ear: External ear normal.  Left Ear: External ear normal.  Eyes: . Pupils are equal, round, and reactive to light. Conjunctivae and EOM are normal Neck: Normal range of motion. Neck supple.  Cardiovascular: Normal rate and regular rhythm.   Pulmonary/Chest: Effort normal and breath sounds normal.  Abd:  Soft, NT, ND, + BS except for mild epig tender, no guarding or rebound Neurological: Pt is alert. Not confused , motor grossly intact Skin: Skin is warm. No rash Psychiatric: Pt behavior is normal. No  agitation.      Assessment & Plan:

## 2014-01-04 NOTE — Addendum Note (Signed)
Addended by: Sharon Seller B on: 01/04/2014 05:25 PM   Modules accepted: Orders

## 2014-01-21 NOTE — Telephone Encounter (Signed)
Patient ask if FLMA papers can be updated for two more days, Please advise

## 2014-01-22 ENCOUNTER — Ambulatory Visit
Admission: RE | Admit: 2014-01-22 | Discharge: 2014-01-22 | Disposition: A | Discharge status: Home / Self Care | Payer: 59 | Source: Ambulatory Visit | Attending: Internal Medicine | Admitting: Internal Medicine

## 2014-01-22 DIAGNOSIS — M5412 Radiculopathy, cervical region: Secondary | ICD-10-CM

## 2014-01-23 ENCOUNTER — Encounter: Payer: Self-pay | Admitting: Internal Medicine

## 2014-01-23 ENCOUNTER — Other Ambulatory Visit: Payer: Self-pay | Admitting: Internal Medicine

## 2014-01-23 DIAGNOSIS — M5412 Radiculopathy, cervical region: Secondary | ICD-10-CM

## 2014-01-23 NOTE — Telephone Encounter (Signed)
yes

## 2014-01-24 NOTE — Telephone Encounter (Signed)
Patient ask if FLMA Papers could be faxed in today?

## 2014-02-22 ENCOUNTER — Ambulatory Visit (AMBULATORY_SURGERY_CENTER): Payer: Self-pay | Admitting: *Deleted

## 2014-02-22 VITALS — Ht 64.75 in | Wt 200.2 lb

## 2014-02-22 DIAGNOSIS — Z1211 Encounter for screening for malignant neoplasm of colon: Secondary | ICD-10-CM

## 2014-02-22 MED ORDER — BISACODYL 5 MG PO TBEC: 5.0000 mg | DELAYED_RELEASE_TABLET | Freq: Every day | ORAL | Status: DC | PRN

## 2014-02-22 MED ORDER — POLYETHYLENE GLYCOL 3350 17 GM/SCOOP PO POWD: 1.0000 | Freq: Every day | ORAL | Status: DC

## 2014-02-22 NOTE — Progress Notes (Signed)
No egg or soy allergy. No anesthesia problems.  No home O2.  No diet meds.  

## 2014-02-25 DIAGNOSIS — Z0279 Encounter for issue of other medical certificate: Secondary | ICD-10-CM

## 2014-03-04 ENCOUNTER — Other Ambulatory Visit: Payer: Self-pay | Admitting: *Deleted

## 2014-03-04 MED ORDER — METFORMIN HCL ER 500 MG PO TB24: 1000.0000 mg | ORAL_TABLET | Freq: Every day | ORAL | Status: DC

## 2014-03-07 ENCOUNTER — Encounter: Payer: Self-pay | Admitting: Internal Medicine

## 2014-03-07 ENCOUNTER — Ambulatory Visit (AMBULATORY_SURGERY_CENTER): Payer: 59 | Admitting: Internal Medicine

## 2014-03-07 VITALS — BP 149/88 | HR 68 | Temp 97.8°F | Resp 24 | Ht 64.75 in | Wt 200.0 lb

## 2014-03-07 DIAGNOSIS — Z1211 Encounter for screening for malignant neoplasm of colon: Secondary | ICD-10-CM

## 2014-03-07 DIAGNOSIS — D128 Benign neoplasm of rectum: Secondary | ICD-10-CM

## 2014-03-07 DIAGNOSIS — K623 Rectal prolapse: Secondary | ICD-10-CM

## 2014-03-07 DIAGNOSIS — K621 Rectal polyp: Secondary | ICD-10-CM

## 2014-03-07 LAB — GLUCOSE, CAPILLARY
GLUCOSE-CAPILLARY: 132 mg/dL — AB (ref 70–99)
Glucose-Capillary: 131 mg/dL — ABNORMAL HIGH (ref 70–99)

## 2014-03-07 MED ORDER — SODIUM CHLORIDE 0.9 % IV SOLN: 500.0000 mL | INTRAVENOUS | Status: DC

## 2014-03-07 NOTE — Progress Notes (Signed)
Report to PACU, RN, vss, BBS= Clear.  

## 2014-03-07 NOTE — Op Note (Signed)
[place]  [address] [place], [address]   COLONOSCOPY PROCEDURE REPORT  PATIENT: Carolyn [name], Carolyn [name]  MR#: [id number] BIRTHDATE: 11/11/1960 , 69  yrs. old GENDER: female ENDOSCOPIST: Gatha Mayer, MD, [facility] PROCEDURE DATE:  03/07/2014 PROCEDURE:   Colonoscopy with biopsy First Screening Colonoscopy - Avg.  risk and is 50 yrs.  old or older Yes.  Prior Negative Screening - Now for repeat screening. N/A  History of Adenoma - Now for follow-up colonoscopy & has been > or = to 3 yrs.  N/A  Polyps Removed Today? Yes. ASA CLASS:   Class II INDICATIONS:average risk for colorectal cancer and first colonoscopy. MEDICATIONS: Propofol 280 mg IV and Monitored anesthesia care  DESCRIPTION OF PROCEDURE:   After the risks benefits and alternatives of the procedure were thoroughly explained, informed consent was obtained.  The digital rectal exam revealed no abnormalities of the rectum.   The [id number]  endoscope was introduced through the anus and advanced to the cecum, which was identified by both the appendix and ileocecal valve. No adverse events experienced.   The quality of the prep was good, using MiraLax  The instrument was then slowly withdrawn as the colon was fully examined.      COLON FINDINGS: A sessile polyp measuring 2 mm in size was found in the rectum.  A polypectomy was performed with cold forceps.  The resection was complete, the polyp tissue was completely retrieved and sent to histology.   The examination was otherwise normal. Retroflexed right colon and rectal views revealed no abnormalities. The time to cecum=2 minutes 07 seconds.  Withdrawal time=9 minutes 19 seconds.  The scope was withdrawn and the procedure completed. COMPLICATIONS: There were no immediate complications.  ENDOSCOPIC IMPRESSION: 1.   Sessile polyp measuring 2 mm in size was found in the rectum; polypectomy was performed with cold forceps 2.   The examination was  otherwise normal - good prep  RECOMMENDATIONS: Timing of repeat colonoscopy will be determined by pathology findings.  eSigned:  Gatha Mayer, MD, [facility] 03/07/2014 12:26 PM   cc: Scarlette Calico, MD and The Patient

## 2014-03-07 NOTE — Patient Instructions (Addendum)
I found and removed one tiny polyp that looks benign. Everything else was normal.  I will let you know pathology results and when to have another routine colonoscopy by mail.  I appreciate the opportunity to care for you. Gatha Mayer, MD, FACG     YOU HAD AN ENDOSCOPIC PROCEDURE TODAY AT [facility]: Refer to the procedure report that was given to you for any specific questions about what was found during the examination.  If the procedure report does not answer your questions, please call your gastroenterologist to clarify.  If you requested that your care partner not be given the details of your procedure findings, then the procedure report has been included in a sealed envelope for you to review at your convenience later.  YOU SHOULD EXPECT: Some feelings of bloating in the abdomen. Passage of more gas than usual.  Walking can help get rid of the air that was put into your GI tract during the procedure and reduce the bloating. If you had a lower endoscopy (such as a colonoscopy or flexible sigmoidoscopy) you may notice spotting of blood in your stool or on the toilet paper. If you underwent a bowel prep for your procedure, then you may not have a normal bowel movement for a few days.  DIET: Your first meal following the procedure should be a light meal and then it is ok to progress to your normal diet.  A half-sandwich or bowl of soup is an example of a good first meal.  Heavy or fried foods are harder to digest and may make you feel nauseous or bloated.  Likewise meals heavy in dairy and vegetables can cause extra gas to form and this can also increase the bloating.  Drink plenty of fluids but you should avoid alcoholic beverages for 24 hours.  ACTIVITY: Your care partner should take you home directly after the procedure.  You should plan to take it easy, moving slowly for the rest of the day.  You can resume normal activity the day after the procedure however you  should NOT DRIVE or use heavy machinery for 24 hours (because of the sedation medicines used during the test).    SYMPTOMS TO REPORT IMMEDIATELY: A gastroenterologist can be reached at any hour.  During normal business hours, 8:30 AM to 5:00 PM Monday through Friday, call [phone].  After hours and on weekends, please call the GI answering service at [phone] who will take a message and have the physician on call contact you.   Following lower endoscopy (colonoscopy or flexible sigmoidoscopy):  Excessive amounts of blood in the stool  Significant tenderness or worsening of abdominal pains  Swelling of the abdomen that is new, acute  Fever of 100F or higher  FOLLOW UP: If any biopsies were taken you will be contacted by phone or by letter within the next 1-3 weeks.  Call your gastroenterologist if you have not heard about the biopsies in 3 weeks.  Our staff will call the home number listed on your records the next business day following your procedure to check on you and address any questions or concerns that you may have at that time regarding the information given to you following your procedure. This is a courtesy call and so if there is no answer at the home number and we have not heard from you through the emergency physician on call, we will assume that you have returned to your regular daily activities without incident.  SIGNATURES/CONFIDENTIALITY: You and/or your care partner have signed paperwork which will be entered into your electronic medical record.  These signatures attest to the fact that that the information above on your After Visit Summary has been reviewed and is understood.  Full responsibility of the confidentiality of this discharge information lies with you and/or your care-partner.    Resume medications. Information  Given on polyps with discharge instructions.

## 2014-03-07 NOTE — Progress Notes (Signed)
Called to room to assist during endoscopic procedure.  Patient ID and intended procedure confirmed with present staff. Received instructions for my participation in the procedure from the performing physician.  

## 2014-03-07 NOTE — Progress Notes (Signed)
Bruise noted to right hand and anterior wrist upon admission. Pt. Made aware and she stated she did not know how she got it.

## 2014-03-08 ENCOUNTER — Telehealth: Payer: Self-pay | Admitting: *Deleted

## 2014-03-08 NOTE — Telephone Encounter (Signed)
No answer. Number identifier. Message left to call if any questions or concerns. 

## 2014-03-09 ENCOUNTER — Encounter: Payer: Self-pay | Admitting: Internal Medicine

## 2014-03-09 ENCOUNTER — Ambulatory Visit (INDEPENDENT_AMBULATORY_CARE_PROVIDER_SITE_OTHER): Payer: 59 | Admitting: Internal Medicine

## 2014-03-09 VITALS — BP 114/78 | HR 79 | Temp 98.5°F | Ht 64.0 in | Wt 198.0 lb

## 2014-03-09 DIAGNOSIS — R1115 Cyclical vomiting syndrome unrelated to migraine: Secondary | ICD-10-CM

## 2014-03-09 DIAGNOSIS — E118 Type 2 diabetes mellitus with unspecified complications: Secondary | ICD-10-CM

## 2014-03-09 DIAGNOSIS — G43A1 Cyclical vomiting, intractable: Secondary | ICD-10-CM

## 2014-03-09 DIAGNOSIS — K311 Adult hypertrophic pyloric stenosis: Secondary | ICD-10-CM | POA: Insufficient documentation

## 2014-03-09 MED ORDER — PROMETHAZINE HCL 12.5 MG PO TABS: 12.5000 mg | ORAL_TABLET | Freq: Four times a day (QID) | ORAL | Status: DC | PRN

## 2014-03-09 MED ORDER — CANAGLIFLOZIN 300 MG PO TABS: 300.0000 mg | ORAL_TABLET | Freq: Every day | ORAL | Status: DC

## 2014-03-09 NOTE — Assessment & Plan Note (Signed)
She has SE's that are concerning for metformin intolerance, will stop it Will control her blood sugars with invokana

## 2014-03-09 NOTE — Progress Notes (Signed)
Pre visit review using our clinic review tool, if applicable. No additional management support is needed unless otherwise documented below in the visit note. 

## 2014-03-09 NOTE — Progress Notes (Signed)
Subjective:    Patient ID: Carolyn [name], female    DOB: Nov 29, 1960, 53 y.o.   MRN: [id number]  Diabetes She presents for her follow-up diabetic visit. She has type 2 diabetes mellitus. Her disease course has been stable. There are no hypoglycemic associated symptoms. Pertinent negatives for hypoglycemia include no dizziness, headaches, seizures or speech difficulty. Pertinent negatives for diabetes include no blurred vision, no chest pain, no fatigue, no foot paresthesias, no foot ulcerations, no polydipsia, no polyphagia, no polyuria, no visual change, no weakness and no weight loss. There are no hypoglycemic complications. Symptoms are stable. There are no diabetic complications. Current diabetic treatment includes oral agent (monotherapy). She is compliant with treatment most of the time. Her weight is stable. She is following a generally healthy diet. Meal planning includes avoidance of concentrated sweets. She participates in exercise intermittently. There is no change in her home blood glucose trend. An ACE inhibitor/angiotensin II receptor blocker is being taken. She does not see a podiatrist.Eye exam is current.      Review of Systems  Constitutional: Positive for appetite change. Negative for fever, chills, weight loss, diaphoresis, activity change, fatigue and unexpected weight change.  HENT: Negative.  Negative for trouble swallowing and voice change.   Eyes: Negative.  Negative for blurred vision.  Respiratory: Negative.  Negative for cough, choking, chest tightness, shortness of breath and stridor.   Cardiovascular: Negative.  Negative for chest pain, palpitations and leg swelling.  Gastrointestinal: Positive for nausea, vomiting, abdominal pain and diarrhea. Negative for constipation, blood in stool, abdominal distention, anal bleeding and rectal pain.       She has recurrent episodes of N/V/abd cramps/diarrhea  Endocrine: Negative.  Negative for polydipsia, polyphagia and  polyuria.  Genitourinary: Negative.  Negative for urgency, hematuria, decreased urine volume, enuresis and difficulty urinating.  Musculoskeletal: Negative.  Negative for back pain and arthralgias.  Skin: Negative.  Negative for rash.  Allergic/Immunologic: Negative.   Neurological: Negative.  Negative for dizziness, seizures, speech difficulty, weakness, light-headedness and headaches.  Hematological: Negative.  Negative for adenopathy. Does not bruise/bleed easily.  Psychiatric/Behavioral: Negative.        Objective:   Physical Exam  Constitutional: She is oriented to person, place, and time. She appears well-developed and well-nourished.  Non-toxic appearance. She does not have a sickly appearance. She does not appear ill. No distress.  HENT:  Head: Normocephalic and atraumatic.  Mouth/Throat: Oropharynx is clear and moist. No oropharyngeal exudate.  Eyes: Conjunctivae are normal. Right eye exhibits no discharge. Left eye exhibits no discharge. No scleral icterus.  Neck: Normal range of motion. Neck supple. No JVD present. No tracheal deviation present. No thyromegaly present.  Cardiovascular: Normal rate, regular rhythm, normal heart sounds and intact distal pulses.  Exam reveals no gallop and no friction rub.   No murmur heard. Pulmonary/Chest: Effort normal and breath sounds normal. No stridor. No respiratory distress. She has no wheezes. She has no rales. She exhibits no tenderness.  Abdominal: Soft. Bowel sounds are normal. She exhibits no distension and no mass. There is no tenderness. There is no rebound and no guarding.  Musculoskeletal: Normal range of motion. She exhibits no edema or tenderness.  Lymphadenopathy:    She has no cervical adenopathy.  Neurological: She is oriented to person, place, and time.  Skin: Skin is warm and dry. No rash noted. She is not diaphoretic. No erythema. No pallor.  Psychiatric: Her behavior is normal. Judgment and thought content normal.    Vitals  reviewed.     Lab Results  Component Value Date   WBC 9.2 12/29/2013   HGB 10.4* 12/29/2013   HCT 31.4* 12/29/2013   PLT 330.0 12/29/2013   GLUCOSE 154* 12/29/2013   CHOL 157 07/30/2013   TRIG 244.0* 07/30/2013   HDL 40.60 07/30/2013   LDLDIRECT 99.8 11/30/2012   LDLCALC 68 07/30/2013   ALT 21 07/30/2013   AST 21 07/30/2013   NA 137 12/29/2013   K 4.2 12/29/2013   CL 101 12/29/2013   CREATININE 0.9 12/29/2013   BUN 22 12/29/2013   CO2 24 12/29/2013   TSH 2.76 07/30/2013   INR 1.04 01/14/2013   HGBA1C 6.9* 12/29/2013      Assessment & Plan:

## 2014-03-09 NOTE — Patient Instructions (Signed)
gastroparesisGastroparesis  Gastroparesis is also called slowed stomach emptying (delayed gastric emptying). It is a condition in which the stomach takes too long to empty its contents. It often happens in people with diabetes.  CAUSES  Gastroparesis happens when nerves to the stomach are damaged or stop working. When the nerves are damaged, the muscles of the stomach and intestines do not work normally. The movement of food is slowed or stopped. High blood glucose (sugar) causes changes in nerves and can damage the blood vessels that carry oxygen and nutrients to the nerves. RISK FACTORS  Diabetes.  Post-viral syndromes.  Eating disorders (anorexia, bulimia).  Surgery on the stomach or vagus nerve.  Gastroesophageal reflux disease (rarely).  Smooth muscle disorders (amyloidosis, scleroderma).  Metabolic disorders, including hypothyroidism.  Parkinson disease. SYMPTOMS   Heartburn.  Feeling sick to your stomach (nausea).  Vomiting of undigested food.  An early feeling of fullness when eating.  Weight loss.  Abdominal bloating.  Erratic blood glucose levels.  Lack of appetite.  Gastroesophageal reflux.  Spasms of the stomach wall. Complications can include:  Bacterial overgrowth in stomach. Food stays in the stomach and can ferment and cause bacteria to grow.  Weight loss due to difficulty digesting and absorbing nutrients.  Vomiting.  Obstruction in the stomach. Undigested food can harden and cause nausea and vomiting.  Blood glucose fluctuations caused by inconsistent food absorption. DIAGNOSIS  The diagnosis of gastroparesis is confirmed through one or more of the following tests:  Barium X-rays and scans. These tests look at how long it takes for food to move through the stomach.  Gastric manometry. This test measures electrical and muscular activity in the stomach. A thin tube is passed down the throat into the stomach. The tube contains a wire that takes  measurements of the stomach's electrical and muscular activity as it digests liquids and solid food.  Endoscopy. This procedure is done with a long, thin tube called an endoscope. It is passed through the mouth and gently down the esophagus into the stomach. This tube helps the caregiver look at the lining of the stomach to check for any abnormalities.  Ultrasonography. This can rule out gallbladder disease or pancreatitis. This test will outline and define the shape of the gallbladder and pancreas. TREATMENT   Treatments may include:  Exercise.  Medicines to control nausea and vomiting.  Medicines to stimulate stomach muscles.  Changes in what and when you eat.  Having smaller meals more often.  Eating low-fiber forms of high-fiber foods, such as eating cooked vegetables instead of raw vegetables.  Eating low-fat foods.  Consuming liquids, which are easier to digest.  In severe cases, feeding tubes and intravenous (IV) feeding may be needed. It is important to note that in most cases, treatment does not cure gastroparesis. It is usually a lasting (chronic) condition. Treatment helps you manage the underlying condition so that you can be as healthy and comfortable as possible. Other treatments  A gastric neurostimulator has been developed to assist people with gastroparesis. The battery-operated device is surgically implanted. It emits mild electrical pulses to help improve stomach emptying and to control nausea and vomiting.  The use of botulinum toxin has been shown to improve stomach emptying by decreasing the prolonged contractions of the muscle between the stomach and the small intestine (pyloric sphincter). The benefits are temporary. SEEK MEDICAL CARE IF:   You have diabetes and you are having problems keeping your blood glucose in goal range.  You are having nausea,  vomiting, bloating, or early feelings of fullness with eating.  Your symptoms do not change with a change  in diet. Document Released: 04/01/2005 Document Revised: 07/27/2012 Document Reviewed: 09/08/2008 [facility] Patient Information 2015 [place], [place]. This information is not intended to replace advice given to you by your health care provider. Make sure you discuss any questions you have with your health care provider.

## 2014-03-09 NOTE — Assessment & Plan Note (Signed)
She has s/s that are concerning for diabetic gastroparesis Will get a NM scan done to confirm this She will take phenergan as needed

## 2014-03-14 ENCOUNTER — Encounter: Payer: Self-pay | Admitting: Internal Medicine

## 2014-03-14 NOTE — Progress Notes (Signed)
Quick Note:  Prolapse polyp - not pre-cancerous Repeat colonoscopy 2025 ______

## 2014-03-16 ENCOUNTER — Telehealth: Payer: Self-pay | Admitting: Internal Medicine

## 2014-03-16 NOTE — Telephone Encounter (Signed)
[facility] calling to verify metformin prescription. In the past it has been 1,000 mg 2x a day, now (11/20) 500 mg 2 x a day, pls verify prescription.  [phone] reference # [id number]

## 2014-03-16 NOTE — Telephone Encounter (Signed)
Med history shows that metformin was discontinued 03/04/14 due to side effects.

## 2014-03-17 ENCOUNTER — Other Ambulatory Visit: Payer: Self-pay

## 2014-03-17 LAB — HM COLONOSCOPY

## 2014-03-17 MED ORDER — POLYETHYLENE GLYCOL 3350 17 GM/SCOOP PO POWD: 1.0000 | Freq: Every day | ORAL | Status: DC

## 2014-03-17 MED ORDER — POLYETHYLENE GLYCOL 3350 17 GM/SCOOP PO POWD: ORAL | Status: DC

## 2014-03-17 NOTE — Telephone Encounter (Signed)
Returned call to [facility] to let them know that med has discontinued.

## 2014-03-18 ENCOUNTER — Other Ambulatory Visit: Payer: Self-pay

## 2014-03-18 ENCOUNTER — Other Ambulatory Visit: Payer: Self-pay | Admitting: *Deleted

## 2014-03-18 DIAGNOSIS — E118 Type 2 diabetes mellitus with unspecified complications: Secondary | ICD-10-CM

## 2014-03-18 MED ORDER — LABETALOL HCL 300 MG PO TABS: 300.0000 mg | ORAL_TABLET | Freq: Two times a day (BID) | ORAL | Status: DC

## 2014-03-18 MED ORDER — TRIAMTERENE-HCTZ 37.5-25 MG PO CAPS: 1.0000 | ORAL_CAPSULE | Freq: Every day | ORAL | Status: DC

## 2014-03-18 MED ORDER — CANAGLIFLOZIN 300 MG PO TABS: 300.0000 mg | ORAL_TABLET | Freq: Every day | ORAL | Status: DC

## 2014-03-24 ENCOUNTER — Encounter (HOSPITAL_COMMUNITY): Payer: Self-pay | Admitting: Cardiovascular Disease

## 2014-03-26 LAB — HM COLONOSCOPY

## 2014-03-28 ENCOUNTER — Telehealth: Payer: Self-pay

## 2014-03-28 NOTE — Telephone Encounter (Signed)
Fax came in from [facility] needing clarification on the pt meds. They have a new rx for labetolol and an old rx for atenolol.  Faxed back - pt should only be taking the labetolol and that the atenolol has been discontinued.

## 2014-03-29 ENCOUNTER — Other Ambulatory Visit: Payer: Self-pay | Admitting: *Deleted

## 2014-03-29 MED ORDER — OMEPRAZOLE 40 MG PO CPDR: 40.0000 mg | DELAYED_RELEASE_CAPSULE | Freq: Two times a day (BID) | ORAL | Status: DC

## 2014-03-30 ENCOUNTER — Other Ambulatory Visit: Payer: Self-pay

## 2014-03-30 MED ORDER — POLYETHYLENE GLYCOL 3350 17 GM/SCOOP PO POWD: ORAL | Status: DC

## 2014-03-30 NOTE — Telephone Encounter (Signed)
Refill from the other day didn't go through so resent today.

## 2014-03-31 ENCOUNTER — Telehealth: Payer: Self-pay | Admitting: *Deleted

## 2014-03-31 NOTE — Telephone Encounter (Signed)
[facility] - Client [facility] Patient Name: Carolyn [name] Gender: Female DOB: Dec 31, 1960 Age: 53 Y 1 M 14 D Return Phone Number: [phone] (Primary) Address: [address] [place]ice [place] City/State/Zip: [place] [address] Client [facility] - Client Client [address] - Day Physician Cathlean Cower Contact Type Call Call Type Triage / Clinical Relationship To Patient Self Return Phone Number [phone] (Primary) Chief Complaint Blood Sugar High Initial Comment Caller states her diabetes is 209 and her hands are hurting. PreDisposition Did not know what to do Nurse Assessment Nurse: Loletta Specter, RN, Wells Guiles Date/Time [name] Time): 03/31/2014 9:54:52 AM Confirm and document reason for call. If symptomatic, describe symptoms. ---Caller states blood sugar 209 and hand pain 8/10. Mostly left hand and swollen. Has the patient traveled out of the country within the last 30 days? ---Not Applicable Does the patient require triage? ---Yes Related visit to physician within the last 2 weeks? ---No Does the PT have any chronic conditions? (i.e. diabetes, asthma, etc.) ---Yes List chronic conditions. ---HTN, diabetes, Did the patient indicate they were pregnant? ---No Guidelines Guideline Title Affirmed Question Affirmed Notes Nurse Date/Time (Eastern Time) Hand and Wrist Pain Patient sounds very sick or weak to the triager Loletta Specter, RN, Wells Guiles 03/31/2014 9:56:05 AM Disp. Time [name] Time) Disposition Final User 03/31/2014 9:58:39 AM Go to ED Now (or PCP triage) Yes Loletta Specter, RN, Romualdo Bolk Understands: Yes Disagree/Comply: Comply Care Advice Given Per Guideline PLEASE NOTE: All timestamps contained within this report are represented as [place] Standard Time. CONFIDENTIALTY NOTICE: This fax transmission is intended only for the addressee. It contains information that is legally  privileged, confidential or otherwise protected from use or disclosure. If you are not the intended recipient, you are strictly prohibited from reviewing, disclosing, copying using or disseminating any of this information or taking any action in reliance on or regarding this information. If you have received this fax in error, please notify us immediately by telephone so that we can arrange for its return to [place]. Phone: [phone], Toll-Free: [phone], Fax: [phone] Page: 2 of 2 Call Id: [id number] Care Advice Given Per Guideline GO TO ED NOW (OR PCP TRIAGE): * IF NO PCP TRIAGE: You need to be seen. Go to the [facility] at [facility] within the next hour. Leave as soon as you can. DRIVING: Another adult should drive. CARE ADVICE given per Hand and Wrist Pain (Adult) guideline. After Care Instructions Given Call Event Type User Date / Time Description

## 2014-04-04 LAB — HM DIABETES EYE EXAM

## 2014-04-06 ENCOUNTER — Other Ambulatory Visit (INDEPENDENT_AMBULATORY_CARE_PROVIDER_SITE_OTHER): Payer: 59

## 2014-04-06 ENCOUNTER — Encounter: Payer: Self-pay | Admitting: Internal Medicine

## 2014-04-06 ENCOUNTER — Ambulatory Visit (INDEPENDENT_AMBULATORY_CARE_PROVIDER_SITE_OTHER): Payer: 59 | Admitting: Internal Medicine

## 2014-04-06 VITALS — BP 128/86 | HR 95 | Temp 98.3°F | Resp 16 | Ht 64.0 in | Wt 196.1 lb

## 2014-04-06 DIAGNOSIS — E118 Type 2 diabetes mellitus with unspecified complications: Secondary | ICD-10-CM

## 2014-04-06 DIAGNOSIS — I10 Essential (primary) hypertension: Secondary | ICD-10-CM

## 2014-04-06 DIAGNOSIS — K219 Gastro-esophageal reflux disease without esophagitis: Secondary | ICD-10-CM

## 2014-04-06 DIAGNOSIS — E669 Obesity, unspecified: Secondary | ICD-10-CM

## 2014-04-06 LAB — BASIC METABOLIC PANEL
BUN: 23 mg/dL (ref 6–23)
CO2: 24 mEq/L (ref 19–32)
Calcium: 10 mg/dL (ref 8.4–10.5)
Chloride: 103 mEq/L (ref 96–112)
Creatinine, Ser: 0.9 mg/dL (ref 0.4–1.2)
GFR: 81.38 mL/min (ref >60.00)
Glucose, Bld: 169 mg/dL — ABNORMAL HIGH (ref 70–99)
Potassium: 3.7 mEq/L (ref 3.5–5.1)
SODIUM: 137 meq/L (ref 135–145)

## 2014-04-06 LAB — HEMOGLOBIN A1C: HEMOGLOBIN A1C: 7.7 % — AB (ref 4.6–6.5)

## 2014-04-06 MED ORDER — GLUCOSE BLOOD VI STRP: ORAL_STRIP | Status: DC

## 2014-04-06 MED ORDER — ONETOUCH VERIO IQ SYSTEM W/DEVICE KIT: 1.0000 | PACK | Freq: Three times a day (TID) | Status: DC

## 2014-04-06 MED ORDER — DULAGLUTIDE 1.5 MG/0.5ML ~~LOC~~ SOAJ: 1.0000 | SUBCUTANEOUS | Status: DC

## 2014-04-06 MED ORDER — OMEPRAZOLE 40 MG PO CPDR: 40.0000 mg | DELAYED_RELEASE_CAPSULE | Freq: Two times a day (BID) | ORAL | Status: DC

## 2014-04-06 NOTE — Progress Notes (Signed)
Pre visit review using our clinic review tool, if applicable. No additional management support is needed unless otherwise documented below in the visit note. 

## 2014-04-06 NOTE — Assessment & Plan Note (Signed)
She will start a GLP-1 agonist This may help her lose weight

## 2014-04-06 NOTE — Patient Instructions (Signed)

## 2014-04-06 NOTE — Assessment & Plan Note (Signed)
Her blood pressure is well controlled I will monitor her lytes and renal function

## 2014-04-06 NOTE — Progress Notes (Signed)
Subjective:    Patient ID: Carolyn [name], female    DOB: 03-25-1961, 53 y.o.   MRN: [id number]  Diabetes She presents for her follow-up diabetic visit. She has type 2 diabetes mellitus. Her disease course has been stable. There are no hypoglycemic associated symptoms. Pertinent negatives for diabetes include no blurred vision, no chest pain, no fatigue, no foot paresthesias, no foot ulcerations, no polydipsia, no polyphagia, no polyuria, no visual change, no weakness and no weight loss. There are no hypoglycemic complications. There are no diabetic complications. Current diabetic treatment includes oral agent (monotherapy). She is compliant with treatment all of the time. Her weight is stable. She is following a generally healthy diet. Meal planning includes avoidance of concentrated sweets. She has not had a previous visit with a dietitian. She participates in exercise intermittently. Her home blood glucose trend is increasing steadily. Her breakfast blood glucose range is generally 180-200 mg/dl. Her lunch blood glucose range is generally 180-200 mg/dl. Her dinner blood glucose range is generally 130-140 mg/dl. Her highest blood glucose is 130-140 mg/dl. Her overall blood glucose range is 140-180 mg/dl. An ACE inhibitor/angiotensin II receptor blocker is being taken. She does not see a podiatrist.Eye exam is current.      Review of Systems  Constitutional: Negative for weight loss and fatigue.  Eyes: Negative for blurred vision.  Cardiovascular: Negative for chest pain.  Endocrine: Negative for polydipsia, polyphagia and polyuria.  Neurological: Negative for weakness.  All other systems reviewed and are negative.      Objective:   Physical Exam  Constitutional: She is oriented to person, place, and time. She appears well-developed and well-nourished. No distress.  HENT:  Head: Normocephalic and atraumatic.  Mouth/Throat: Oropharynx is clear and moist. No oropharyngeal exudate.  Eyes:  Conjunctivae are normal. Right eye exhibits no discharge. Left eye exhibits no discharge. No scleral icterus.  Neck: Normal range of motion. Neck supple. No JVD present. No tracheal deviation present. No thyromegaly present.  Cardiovascular: Normal rate, regular rhythm, normal heart sounds and intact distal pulses.  Exam reveals no gallop and no friction rub.   No murmur heard. Pulmonary/Chest: Effort normal and breath sounds normal. No stridor. No respiratory distress. She has no wheezes. She has no rales. She exhibits no tenderness.  Abdominal: Soft. Bowel sounds are normal. She exhibits no distension and no mass. There is no tenderness. There is no rebound and no guarding.  Musculoskeletal: Normal range of motion. She exhibits no edema or tenderness.  Lymphadenopathy:    She has no cervical adenopathy.  Neurological: She is oriented to person, place, and time.  Skin: Skin is warm and dry. No rash noted. She is not diaphoretic. No erythema. No pallor.  Psychiatric: She has a normal mood and affect. Her behavior is normal. Judgment and thought content normal.  Vitals reviewed.    Lab Results  Component Value Date   WBC 9.2 12/29/2013   HGB 10.4* 12/29/2013   HCT 31.4* 12/29/2013   PLT 330.0 12/29/2013   GLUCOSE 154* 12/29/2013   CHOL 157 07/30/2013   TRIG 244.0* 07/30/2013   HDL 40.60 07/30/2013   LDLDIRECT 99.8 11/30/2012   LDLCALC 68 07/30/2013   ALT 21 07/30/2013   AST 21 07/30/2013   NA 137 12/29/2013   K 4.2 12/29/2013   CL 101 12/29/2013   CREATININE 0.9 12/29/2013   BUN 22 12/29/2013   CO2 24 12/29/2013   TSH 2.76 07/30/2013   INR 1.04 01/14/2013   HGBA1C 6.9*  12/29/2013       Assessment & Plan:

## 2014-04-06 NOTE — Assessment & Plan Note (Signed)
Her blood sugars do not appear to be well controlled Will start trulicity, cont. Invokana, she does not tolerate merformin Will recheck her A1C today and will monitor her renal function

## 2014-04-07 ENCOUNTER — Other Ambulatory Visit: Payer: Self-pay | Admitting: Cardiovascular Disease

## 2014-04-13 ENCOUNTER — Telehealth: Payer: Self-pay | Admitting: Internal Medicine

## 2014-04-13 NOTE — Telephone Encounter (Signed)
Patient states that [facility] is stating that Dulaglutide (TRULICITY) 1.5 MA/0.0KH SOPN [[id number]] needs prior authorization. Please review.   [facility] ph # [phone] Fax# [phone] [facility] urgent

## 2014-04-14 ENCOUNTER — Ambulatory Visit (HOSPITAL_COMMUNITY)
Admission: RE | Admit: 2014-04-14 | Discharge: 2014-04-14 | Disposition: A | Discharge status: Home / Self Care | Payer: 59 | Source: Ambulatory Visit | Attending: Internal Medicine | Admitting: Internal Medicine

## 2014-04-14 ENCOUNTER — Encounter (HOSPITAL_COMMUNITY): Payer: Self-pay

## 2014-04-14 DIAGNOSIS — R1115 Cyclical vomiting syndrome unrelated to migraine: Secondary | ICD-10-CM

## 2014-04-14 DIAGNOSIS — G43A1 Cyclical vomiting, intractable: Secondary | ICD-10-CM | POA: Insufficient documentation

## 2014-04-14 DIAGNOSIS — E118 Type 2 diabetes mellitus with unspecified complications: Secondary | ICD-10-CM | POA: Diagnosis not present

## 2014-04-14 MED ORDER — TECHNETIUM TC 99M SULFUR COLLOID
2.2000 | Freq: Once | INTRAVENOUS | Status: AC | PRN
  Administered 2014-04-14: 2.2 via ORAL

## 2014-04-15 ENCOUNTER — Encounter: Payer: Self-pay | Admitting: Internal Medicine

## 2014-04-15 ENCOUNTER — Other Ambulatory Visit: Payer: Self-pay | Admitting: Internal Medicine

## 2014-04-15 DIAGNOSIS — K311 Adult hypertrophic pyloric stenosis: Secondary | ICD-10-CM

## 2014-04-25 ENCOUNTER — Encounter: Payer: Self-pay | Admitting: Nurse Practitioner

## 2014-04-25 ENCOUNTER — Ambulatory Visit (INDEPENDENT_AMBULATORY_CARE_PROVIDER_SITE_OTHER): Payer: 59 | Admitting: Nurse Practitioner

## 2014-04-25 VITALS — BP 100/74 | HR 64 | Ht 64.0 in | Wt 193.6 lb

## 2014-04-25 DIAGNOSIS — R9389 Abnormal findings on diagnostic imaging of other specified body structures: Secondary | ICD-10-CM | POA: Insufficient documentation

## 2014-04-25 DIAGNOSIS — R112 Nausea with vomiting, unspecified: Secondary | ICD-10-CM

## 2014-04-25 DIAGNOSIS — R938 Abnormal findings on diagnostic imaging of other specified body structures: Secondary | ICD-10-CM

## 2014-04-25 NOTE — Progress Notes (Signed)
Agree with Ms. Guenther's assessment and plan.  E. , MD, FACG   

## 2014-04-25 NOTE — Progress Notes (Signed)
     History of Present Illness:   Patient is a 54 year old female known to [name] from a screening colonoscopy last month. She is referred by PCP for evaluation of abnormal gastric emptying study. Patient has type 2 diabetes, most recent hemoglobin A1c 7.7.  Patient's mother passed away in 25-Nov-2022. Since then she has had early satiety, frequent nausea and vomiting of solids. It has only been in the last couple weeks that she has begun to lose weight however. She takes a daily PPI. Overall her weight is down about 6 pounds. Recent gastric imaging study showed no gastric emptying of solid material in 2 hours time. In addition to above patient has been having problems with constipation.   Current Medications, Allergies, Past Medical History, Past Surgical History, Family History and Social History were reviewed in [employer] record.  Studies:   Nm Gastric Emptying  04/14/2014   CLINICAL DATA:  Six month history of nausea and vomiting  EXAM: NUCLEAR MEDICINE GASTRIC EMPTYING SCAN  Views:  LAO stomach  Radionuclide:  Technetium 66m sulfur colloid in egg  Dose:  2.2 mCi  Route of administration: Oral  COMPARISON:  None.  FINDINGS: Images and count statistics were obtained serially over a 2 hr time span. There is essentially no emptying of solid material from the stomach after 2 hr. This finding is markedly abnormal.  IMPRESSION: Essentially no emptying of solid material from the stomach after 2 hr. This finding is distinctly abnormal and raises concern for either gastric paresis or gastric outlet obstruction to solid material.   Electronically Signed   By: Lowella Grip M.D.   On: 04/14/2014 10:47   Physical Exam: General: Pleasant, well developed , black female in no acute distress Head: Normocephalic and atraumatic Eyes:  sclerae anicteric, conjunctiva pink  Ears: Normal auditory acuity Lungs: Clear throughout to auscultation Heart: Regular rate and rhythm Abdomen:  Soft, non distended, non-tender. No masses, no hepatomegaly. Normal bowel sounds. No succussion splash  Musculoskeletal: Symmetrical with no gross deformities  Extremities: No edema  Neurological: Alert oriented x 4, grossly nonfocal Psychological:  Alert and cooperative. Normal mood and affect  Assessment and Recommendations:   54 year old female with a several month history of early satiety, nausea and vomiting in setting of a markedly abnormal gastric emptying with no emptying of solid material at 2 hours. Unusual case in that she hasn't had longstanding diabetes and her A1C of 7.7 correlates with average blood sugars below 200. Need to exclude other etiologies of early satiety / nausea / vomiting. For further evaluation patient will be scheduled for upper endoscopy (this week). I asked her to take only clear liquids the day before procedure. The benefits, risks, and potential complications of EGD with possible biopsies were discussed with the patient and she agrees to proceed.

## 2014-04-25 NOTE — Patient Instructions (Addendum)
You have been scheduled for an endoscopy. Please follow written instructions given to you at your visit today. If you use inhalers (even only as needed), please bring them with you on the day of your procedure. Do not take the Invokana the morning of the procedure.  Call [name] and ask how he wants you to take the Trilicity injectable insulin.

## 2014-04-26 ENCOUNTER — Other Ambulatory Visit: Payer: Self-pay

## 2014-04-26 MED ORDER — LOSARTAN POTASSIUM 100 MG PO TABS: 100.0000 mg | ORAL_TABLET | Freq: Every day | ORAL | Status: DC

## 2014-04-26 MED ORDER — DILTIAZEM HCL ER COATED BEADS 240 MG PO CP24: 240.0000 mg | ORAL_CAPSULE | Freq: Every day | ORAL | Status: DC

## 2014-04-27 ENCOUNTER — Ambulatory Visit (AMBULATORY_SURGERY_CENTER): Payer: 59 | Admitting: Internal Medicine

## 2014-04-27 ENCOUNTER — Encounter: Payer: Self-pay | Admitting: Internal Medicine

## 2014-04-27 VITALS — BP 152/87 | HR 75 | Temp 98.2°F | Resp 15

## 2014-04-27 DIAGNOSIS — R1110 Vomiting, unspecified: Secondary | ICD-10-CM

## 2014-04-27 DIAGNOSIS — K317 Polyp of stomach and duodenum: Secondary | ICD-10-CM

## 2014-04-27 DIAGNOSIS — R9389 Abnormal findings on diagnostic imaging of other specified body structures: Secondary | ICD-10-CM

## 2014-04-27 DIAGNOSIS — R938 Abnormal findings on diagnostic imaging of other specified body structures: Secondary | ICD-10-CM

## 2014-04-27 LAB — GLUCOSE, CAPILLARY
GLUCOSE-CAPILLARY: 97 mg/dL (ref 70–99)
Glucose-Capillary: 87 mg/dL (ref 70–99)

## 2014-04-27 MED ORDER — METOCLOPRAMIDE HCL 10 MG PO TABS: 10.0000 mg | ORAL_TABLET | Freq: Three times a day (TID) | ORAL | Status: DC

## 2014-04-27 MED ORDER — SODIUM CHLORIDE 0.9 % IV SOLN: 500.0000 mL | INTRAVENOUS | Status: DC

## 2014-04-27 NOTE — Progress Notes (Signed)
Called to room to assist during endoscopic procedure.  Patient ID and intended procedure confirmed with present staff. Received instructions for my participation in the procedure from the performing physician.  

## 2014-04-27 NOTE — Op Note (Addendum)
[facility]  [address] [place], [id number]   ENDOSCOPY PROCEDURE REPORT  PATIENT: Carolyn [name], Carolyn [name]  MR#: [id number] BIRTHDATE: 1960-10-17 , 15  yrs. old GENDER: female ENDOSCOPIST: Gatha Mayer, MD, [facility] PROCEDURE DATE:  04/27/2014 PROCEDURE:  EGD w/ biopsy ASA CLASS:     Class II INDICATIONS:  vomiting, abnormal gastric emptying. MEDICATIONS: Propofol 200 mg IV and Monitored anesthesia care TOPICAL ANESTHETIC: none  DESCRIPTION OF PROCEDURE: After the risks benefits and alternatives of the procedure were thoroughly explained, informed consent was obtained.  The LB [id number] endoscope was introduced through the mouth and advanced to the second portion of the duodenum , Without limitations.  The instrument was slowly withdrawn as the mucosa was fully examined.    1) Multiple subcentimeter gastric polyps - biopsied 2) retained fluid but no solid food in mildly dilated stomach 3) otherwise normal EGD.  Retroflexed views revealed as previously described.     The scope was then withdrawn from the patient and the procedure completed.  COMPLICATIONS: There were no immediate complications.  ENDOSCOPIC IMPRESSION: 1) Multiple subcentimeter gastric polyps - biopsied 2) retained fluid but no solid food in mildly dilated stomach 3) otherwise normal EGD  RECOMMENDATIONS: 1.  Start metaclopramide 10 mg qid (AC/HS) 2.  Await biopsy results   eSigned:  Gatha Mayer, MD, [facility] 04/27/2014 4:32 PM    CC: The Patient and Scarlette Calico, MD

## 2014-04-27 NOTE — Patient Instructions (Addendum)
There were some stomach polyps seen and biopsied. I don't think these are a problem. Some fluid in the stomach but no retained food and no obstruction.  I have prescribed a medication called metaclopramide to help the stomach empty better. Let's see if that helps  Please follow a step 1 or 2 gastroparesis diet until you feel better then try step 3.  I will send biopsy results and arrange follow-up when we call.  I appreciate the opportunity to care for you. Carolyn Mayer, MD, FACG       YOU HAD AN ENDOSCOPIC PROCEDURE TODAY AT [facility]: Refer to the procedure report that was given to you for any specific questions about what was found during the examination.  If the procedure report does not answer your questions, please call your gastroenterologist to clarify.  If you requested that your care partner not be given the details of your procedure findings, then the procedure report has been included in a sealed envelope for you to review at your convenience later.  YOU SHOULD EXPECT: Some feelings of bloating in the abdomen. Passage of more gas than usual.  Walking can help get rid of the air that was put into your GI tract during the procedure and reduce the bloating. If you had a lower endoscopy (such as a colonoscopy or flexible sigmoidoscopy) you may notice spotting of blood in your stool or on the toilet paper. If you underwent a bowel prep for your procedure, then you may not have a normal bowel movement for a few days.  DIET: Your first meal following the procedure should be a light meal and then it is ok to progress to your normal diet.  A half-sandwich or bowl of soup is an example of a good first meal.  Heavy or fried foods are harder to digest and may make you feel nauseous or bloated.  Likewise meals heavy in dairy and vegetables can cause extra gas to form and this can also increase the bloating.  Drink plenty of fluids but you should avoid alcoholic  beverages for 24 hours.  ACTIVITY: Your care partner should take you home directly after the procedure.  You should plan to take it easy, moving slowly for the rest of the day.  You can resume normal activity the day after the procedure however you should NOT DRIVE or use heavy machinery for 24 hours (because of the sedation medicines used during the test).    SYMPTOMS TO REPORT IMMEDIATELY: A gastroenterologist can be reached at any hour.  During normal business hours, 8:30 AM to 5:00 PM Monday through Friday, call [phone].  After hours and on weekends, please call the GI answering service at [phone] who will take a message and have the physician on call contact you.   Following upper endoscopy (EGD)  Vomiting of blood or coffee ground material  New chest pain or pain under the shoulder blades  Painful or persistently difficult swallowing  New shortness of breath  Fever of 100F or higher  Black, tarry-looking stools  FOLLOW UP: If any biopsies were taken you will be contacted by phone or by letter within the next 1-3 weeks.  Call your gastroenterologist if you have not heard about the biopsies in 3 weeks.  Our staff will call the home number listed on your records the next business day following your procedure to check on you and address any questions or concerns that you may have at that time  regarding the information given to you following your procedure. This is a courtesy call and so if there is no answer at the home number and we have not heard from you through the emergency physician on call, we will assume that you have returned to your regular daily activities without incident.  SIGNATURES/CONFIDENTIALITY: You and/or your care partner have signed paperwork which will be entered into your electronic medical record.  These signatures attest to the fact that that the information above on your After Visit Summary has been reviewed and is understood.  Full responsibility of  the confidentiality of this discharge information lies with you and/or your care-partner.   Resume medications. Information given on Gastroparesis Diet with discharge instructions. Verbal order per [name] Hold TRULICITY  Until you hear from [name]  Or [name].

## 2014-04-27 NOTE — Progress Notes (Signed)
  [facility] Post-op Note  Patient: Carolyn [name]  Procedure(s) Performed: endoscopy  Patient Location: [facility] - Recovery Area  Anesthesia Type: Deep Sedation/Propofol  Level of Consciousness: awake, oriented and patient cooperative  Airway and Oxygen Therapy: Patient Spontanous Breathing  Post-op Pain: none  Post-op Assessment:  Post-op Vital signs reviewed, Patient's Cardiovascular Status Stable, Respiratory Function Stable, Patent Airway, No signs of Nausea or vomiting and Pain level controlled  Post-op Vital Signs: Reviewed and stable  Complications: No apparent anesthesia complications  ,  E 4:27 PM

## 2014-04-28 ENCOUNTER — Telehealth: Payer: Self-pay | Admitting: *Deleted

## 2014-04-28 NOTE — Telephone Encounter (Signed)
No answer, message left for the patient. 

## 2014-05-04 ENCOUNTER — Telehealth: Payer: Self-pay | Admitting: *Deleted

## 2014-05-04 ENCOUNTER — Telehealth: Payer: Self-pay | Admitting: Internal Medicine

## 2014-05-04 DIAGNOSIS — E118 Type 2 diabetes mellitus with unspecified complications: Secondary | ICD-10-CM

## 2014-05-04 DIAGNOSIS — K219 Gastro-esophageal reflux disease without esophagitis: Secondary | ICD-10-CM

## 2014-05-04 MED ORDER — DULAGLUTIDE 1.5 MG/0.5ML ~~LOC~~ SOAJ: 1.0000 | SUBCUTANEOUS | Status: DC

## 2014-05-04 MED ORDER — OMEPRAZOLE 40 MG PO CPDR: 40.0000 mg | DELAYED_RELEASE_CAPSULE | Freq: Two times a day (BID) | ORAL | Status: DC

## 2014-05-04 NOTE — Telephone Encounter (Signed)
Has the nausea and vomiting improved since she stopped trulicity?

## 2014-05-04 NOTE — Telephone Encounter (Signed)
Not insulin but Trulicity which can make her vomit [name] and I think she should stop it for now  Let her know gastric polyps are benign. No recall needed on that.  Lets see how she does over next week and have her call back re: vomiting and also see what has happened since EGD

## 2014-05-04 NOTE — Telephone Encounter (Signed)
Patient notified of the recommendations.  She is concerned about her BS.  She is advised to contact Dr. Adah Salvage office for additional instructions about her diabetes treatment.  She will call back in a few weeks with an update on vomiting

## 2014-05-04 NOTE — Telephone Encounter (Signed)
Tried to complete PA on cover my meds. No PA has been submitted to md from pharmacy. Will send another prescription to pharmacy so they can run insurance so we can start PA...Carolyn [name]

## 2014-05-04 NOTE — Telephone Encounter (Signed)
She can stay on trulicity

## 2014-05-04 NOTE — Telephone Encounter (Signed)
Notified pt with md response. She stated that md need to do PA for trulicity & omeprazole. He had given samples on the trulicity. Per chart 12/30 pharmacy sent PA info or trulicity. Inform pt will go ahead and start PA & will give her a call bck once we get determination...[name]

## 2014-05-04 NOTE — Telephone Encounter (Signed)
Completed both PA on cover my meds waiting on approval status...Carolyn [name]

## 2014-05-04 NOTE — Telephone Encounter (Signed)
She said you would get back with her about her insulin.  She is scheduled for a dose tomorrow.  She also reports that she can't tolerate metoclopramide.  States made her feel bad "for lack of a better word it paralyzed me".  Took for 3 days and has stopped it.  Please advise next steps.  She is instructed not to take any additional reglan ( I added it to her allergy list)

## 2014-05-04 NOTE — Telephone Encounter (Signed)
Pt states [name] took her off her trulicity last Wednesday, and was told that [name] would call her to advise her what to take. Pt states she haven't received called. She is schedule to take insulin today. Her BS has been running 130's, 141, yesterday it was 137. When she was taking the trulicity it would be 786 or less. Pls advise on what pt need to do...[name]

## 2014-05-04 NOTE — Telephone Encounter (Signed)
Pt called back she stated she is still having the symptoms, but its not the trulicity. When she had the EDG it showed that her food is not digesting. She is trying to eat smaller amounts but not really helping...Carolyn [name]

## 2014-05-04 NOTE — Telephone Encounter (Signed)
Called pt no answer LMOM RTC.../lmb 

## 2014-05-05 ENCOUNTER — Encounter: Payer: Self-pay | Admitting: Internal Medicine

## 2014-05-05 NOTE — Progress Notes (Signed)
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

## 2014-05-09 ENCOUNTER — Other Ambulatory Visit: Payer: Self-pay | Admitting: Internal Medicine

## 2014-05-09 DIAGNOSIS — E118 Type 2 diabetes mellitus with unspecified complications: Secondary | ICD-10-CM

## 2014-05-09 MED ORDER — ALBIGLUTIDE 50 MG ~~LOC~~ PEN: 1.0000 | PEN_INJECTOR | SUBCUTANEOUS | Status: DC

## 2014-05-09 NOTE — Telephone Encounter (Signed)
Check status on PA's on cover-my-meds. Trulicity was denied it stated that pt must had tried and fail 3 alternative. They did not give list alternatives. Called pt no answer LMOM with PA status...Carolyn [name]

## 2014-05-19 ENCOUNTER — Ambulatory Visit (INDEPENDENT_AMBULATORY_CARE_PROVIDER_SITE_OTHER): Payer: 59 | Admitting: Internal Medicine

## 2014-05-19 ENCOUNTER — Telehealth: Payer: Self-pay | Admitting: Internal Medicine

## 2014-05-19 ENCOUNTER — Encounter: Payer: Self-pay | Admitting: Internal Medicine

## 2014-05-19 VITALS — BP 140/88 | HR 80 | Temp 98.3°F | Resp 16 | Ht 64.0 in | Wt 195.0 lb

## 2014-05-19 DIAGNOSIS — H9319 Tinnitus, unspecified ear: Secondary | ICD-10-CM | POA: Insufficient documentation

## 2014-05-19 DIAGNOSIS — K2100 Gastro-esophageal reflux disease with esophagitis, without bleeding: Secondary | ICD-10-CM

## 2014-05-19 DIAGNOSIS — J301 Allergic rhinitis due to pollen: Secondary | ICD-10-CM

## 2014-05-19 DIAGNOSIS — H9313 Tinnitus, bilateral: Secondary | ICD-10-CM

## 2014-05-19 MED ORDER — BECLOMETHASONE DIPROPIONATE 80 MCG/ACT NA AERS: 4.0000 | INHALATION_SPRAY | Freq: Every day | NASAL | Status: DC

## 2014-05-19 NOTE — Assessment & Plan Note (Signed)
This may be related to the flare up in allergic rhinitis so will start a steroid NS I am also concerned about hearing loss so will refer to audiology for further evaluation

## 2014-05-19 NOTE — Assessment & Plan Note (Signed)
She will cont zyrtec and sudafed Will start Qnasl to treat this as well

## 2014-05-19 NOTE — Telephone Encounter (Signed)
Pt called in and said that now she needs a PA on her   omeprazole (PRILOSEC) 40 MG capsule [[id number]]   They have closed that PA a new one has to be sent.

## 2014-05-19 NOTE — Patient Instructions (Signed)
Tinnitus °Sounds you hear in your ears and coming from within the ear is called tinnitus. This can be a symptom of many ear disorders. It is often associated with hearing loss.  °Tinnitus can be seen with: °· Infections. °· Ear blockages such as wax buildup. °· Meniere's disease. °· Ear damage. °· Inherited. °· Occupational causes. °While irritating, it is not usually a threat to health. When the cause of the tinnitus is wax, infection in the middle ear, or foreign body it is easily treated. Hearing loss will usually be reversible.  °TREATMENT  °When treating the underlying cause does not get rid of tinnitus, it may be necessary to get rid of the unwanted sound by covering it up with more pleasant background noises. This may include music, the radio etc. There are tinnitus maskers which can be worn which produce background noise to cover up the tinnitus. °Avoid all medications which tend to make tinnitus worse such as alcohol, caffeine, aspirin, and nicotine. There are many soothing background tapes such as rain, ocean, thunderstorms, etc. These soothing sounds help with sleeping or resting. °Keep all follow-up appointments and referrals. This is important to identify the cause of the problem. It also helps avoid complications, impaired hearing, disability, or chronic pain. °Document Released: 04/01/2005 Document Revised: 06/24/2011 Document Reviewed: 11/18/2007 °Exit[employer]® Patient Information ©2015 [employer]. This information is not intended to replace advice given to you by your health care provider. Make sure you discuss any questions you have with your health care provider. ° °

## 2014-05-19 NOTE — Progress Notes (Signed)
Pre visit review using our clinic review tool, if applicable. No additional management support is needed unless otherwise documented below in the visit note. 

## 2014-05-19 NOTE — Progress Notes (Signed)
Subjective:    Patient ID: Carolyn [name], female    DOB: July 25, 1960, 54 y.o.   MRN: [id number]  HPI  She complains of a 2-3 week history of runny nose, nasal congestion, and ringing in her ears.  Review of Systems  Constitutional: Negative.  Negative for fever, chills, diaphoresis, appetite change and fatigue.  HENT: Positive for congestion, postnasal drip, rhinorrhea and tinnitus. Negative for ear discharge, ear pain, facial swelling, nosebleeds, sinus pressure, sneezing, sore throat and trouble swallowing.   Eyes: Negative.   Respiratory: Negative.  Negative for cough, choking, chest tightness, shortness of breath and stridor.   Cardiovascular: Negative.  Negative for chest pain, palpitations and leg swelling.  Gastrointestinal: Negative.  Negative for nausea, vomiting, abdominal pain, diarrhea, constipation and blood in stool.  Endocrine: Negative.   Genitourinary: Negative.   Musculoskeletal: Negative.  Negative for myalgias, back pain, arthralgias and neck pain.  Skin: Negative.  Negative for rash.  Allergic/Immunologic: Negative.   Neurological: Negative.   Hematological: Negative.  Negative for adenopathy. Does not bruise/bleed easily.  Psychiatric/Behavioral: Negative.        Objective:   Physical Exam  Constitutional: She is oriented to person, place, and time. She appears well-developed and well-nourished. No distress.  HENT:  Head: Normocephalic and atraumatic.  Right Ear: Hearing, tympanic membrane, external ear and ear canal normal.  Left Ear: Hearing, tympanic membrane, external ear and ear canal normal.  Nose: Mucosal edema present. No rhinorrhea, sinus tenderness, nasal deformity, septal deviation or nasal septal hematoma. Right sinus exhibits no maxillary sinus tenderness and no frontal sinus tenderness. Left sinus exhibits no maxillary sinus tenderness and no frontal sinus tenderness.  Mouth/Throat: Oropharynx is clear and moist and mucous membranes are normal.  Mucous membranes are not pale, not dry and not cyanotic. No oral lesions. No trismus in the jaw. No uvula swelling. No oropharyngeal exudate, posterior oropharyngeal edema, posterior oropharyngeal erythema or tonsillar abscesses.  Eyes: Conjunctivae are normal. Right eye exhibits no discharge. Left eye exhibits no discharge. No scleral icterus.  Neck: Normal range of motion. Neck supple. No JVD present. No tracheal deviation present. No thyromegaly present.  Cardiovascular: Normal rate, regular rhythm, normal heart sounds and intact distal pulses.  Exam reveals no gallop and no friction rub.   No murmur heard. Pulmonary/Chest: Effort normal and breath sounds normal. No stridor. No respiratory distress. She has no wheezes. She has no rales. She exhibits no tenderness.  Abdominal: Soft. Bowel sounds are normal. She exhibits no distension and no mass. There is no tenderness. There is no rebound and no guarding.  Musculoskeletal: Normal range of motion. She exhibits no edema or tenderness.  Lymphadenopathy:    She has no cervical adenopathy.  Neurological: She is oriented to person, place, and time.  Skin: Skin is warm and dry. No rash noted. She is not diaphoretic. No erythema. No pallor.  Vitals reviewed.    Lab Results  Component Value Date   WBC 9.2 12/29/2013   HGB 10.4* 12/29/2013   HCT 31.4* 12/29/2013   PLT 330.0 12/29/2013   GLUCOSE 169* 04/06/2014   CHOL 157 07/30/2013   TRIG 244.0* 07/30/2013   HDL 40.60 07/30/2013   LDLDIRECT 99.8 11/30/2012   LDLCALC 68 07/30/2013   ALT 21 07/30/2013   AST 21 07/30/2013   NA 137 04/06/2014   K 3.7 04/06/2014   CL 103 04/06/2014   CREATININE 0.9 04/06/2014   BUN 23 04/06/2014   CO2 24 04/06/2014   TSH 2.76  07/30/2013   INR 1.04 01/14/2013   HGBA1C 7.7* 04/06/2014       Assessment & Plan:

## 2014-05-24 MED ORDER — OMEPRAZOLE-SODIUM BICARBONATE 40-1100 MG PO CAPS: 1.0000 | ORAL_CAPSULE | Freq: Every day | ORAL | Status: DC

## 2014-05-24 NOTE — Telephone Encounter (Signed)
PA denied x 3, only med covered with out a PA is zegrid.

## 2014-05-24 NOTE — Progress Notes (Signed)
Patient ID: Carolyn [name], female   DOB: 11/12/1960, 54 y.o.   MRN: [id number] 54 y.o. with HTN Echo done 5/6 /15 with EF 50% mild diffuse hypokinesis Seen by pulmonary for ? Sleep apnea but does not qualify for CPAP  She is not happy with her primary MD Does not feel that her BP is well controlled. He has not addressed her anemia and he is "scaring" me  About my heart.  She had cath 10/14 with no CAD Ef was normal No RAS and EDP 14  She is compliant with her meds  Has fatigue malaise and some anxiety  Echo 5/15 Study Conclusions  - Left ventricle: The cavity size was normal. Wall thickness was normal. The estimated ejection fraction was 50%. Diffuse hypokinesis. Doppler parameters are consistent with abnormal left ventricular relaxation (grade 1 diastolic dysfunction). - Aortic valve: There was no stenosis. - Mitral valve: No significant regurgitation. - Right ventricle: The cavity size was normal. Systolic function was normal. - Pulmonary arteries: No complete TR doppler jet so unable to estimate PA systolic pressure. - Inferior vena cava: The vessel was normal in size; the respirophasic diameter changes were in the normal range (= 50%); findings are consistent with normal central venous pressure. Impressions:  - Normal LV size with mild global hypokinesis, EF 50%. Normal RV size and systolic function. No significant valvular abnormalities.  Last visit started on labatolol  For better BP control  Doing well Still having some GI issues with dysmotility  Sees Carlean Purl Could not tolerate Reglan   ROS: Denies fever, malais, weight loss, blurry vision, decreased visual acuity, cough, sputum, SOB, hemoptysis, pleuritic pain, palpitaitons, heartburn, abdominal pain, melena, lower extremity edema, claudication, or rash.  All other systems reviewed and negative  General: Affect appropriate Healthy:  appears stated age 54: normal Neck supple with no adenopathy JVP normal no bruits  no thyromegaly Lungs clear with no wheezing and good diaphragmatic motion Heart:  S1/S2 no murmur, no rub, gallop or click PMI normal Abdomen: benighn, BS positve, no tenderness, no AAA no bruit.  No HSM or HJR Distal pulses intact with no bruits No edema Neuro non-focal Skin warm and dry No muscular weakness   Current Outpatient Prescriptions  Medication Sig Dispense Refill  . Albiglutide (TANZEUM) 50 MG PEN Inject 1 Act into the skin once a week. 12 each 3  . aspirin EC 81 MG EC tablet Take 1 tablet (81 mg total) by mouth daily.    . Beclomethasone Dipropionate (QNASL) 80 MCG/ACT AERS Place 4 puffs into the nose daily. 8.7 g 11  . Blood Glucose Monitoring Suppl (ONETOUCH VERIO IQ SYSTEM) W/DEVICE KIT 1 Act by Does not apply route 3 (three) times daily. 2 kit 0  . Calcium Carbonate-Vit D-Min (CALCIUM 1200 PO) Take 1 tablet by mouth daily.    . canagliflozin (INVOKANA) 300 MG TABS tablet Take 300 mg by mouth daily before breakfast. 90 tablet 3  . cetirizine (ZYRTEC) 10 MG chewable tablet Chew 10 mg by mouth daily.    . cloNIDine (CATAPRES) 0.1 MG tablet Take 0.2 mg by mouth daily.     Marland Kitchen diltiazem (CARDIZEM CD) 240 MG 24 hr capsule Take 1 capsule (240 mg total) by mouth daily. 90 capsule 3  . glucose blood (ONE TOUCH ULTRA TEST) test strip Use to test blood sugar three times a week and prn if having symptoms of low blood sugar Dx: 250.92 90 day supply 100 each 11  . glucose blood (ONETOUCH VERIO)  test strip Use TID 100 each 12  . labetalol (NORMODYNE) 300 MG tablet Take 1 tablet by mouth two  times daily 120 tablet 1  . losartan (COZAAR) 100 MG tablet Take 1 tablet (100 mg total) by mouth daily. 90 tablet 3  . metoCLOPramide (REGLAN) 10 MG tablet Take 1 tablet (10 mg total) by mouth 4 (four) times daily -  before meals and at bedtime. 120 tablet 1  . Multiple Vitamins-Minerals (MULTIVITAMIN WITH MINERALS) tablet Take 1 tablet by mouth daily.    Marland Kitchen omeprazole-sodium bicarbonate (ZEGERID)  40-1100 MG per capsule Take 1 capsule by mouth daily before breakfast. 90 capsule 3  . promethazine (PHENERGAN) 12.5 MG tablet Take 1 tablet (12.5 mg total) by mouth every 6 (six) hours as needed for nausea or vomiting. 30 tablet 2  . simvastatin (ZOCOR) 40 MG tablet Take 1 tablet (40 mg total) by mouth at bedtime. 90 tablet 3  . triamterene-hydrochlorothiazide (DYAZIDE) 37.5-25 MG per capsule Take 1 each (1 capsule total) by mouth daily. 90 capsule 1   No current facility-administered medications for this visit.    Allergies  Metformin and related; Codeine; Flexeril; Morphine and related; and Reglan  Electrocardiogram:  SR LVH  11/22/13   Assessment and Plan

## 2014-05-25 ENCOUNTER — Ambulatory Visit (INDEPENDENT_AMBULATORY_CARE_PROVIDER_SITE_OTHER): Payer: 59 | Admitting: Cardiovascular Disease

## 2014-05-25 ENCOUNTER — Encounter: Payer: Self-pay | Admitting: Cardiovascular Disease

## 2014-05-25 VITALS — BP 114/76 | HR 82 | Ht 64.0 in | Wt 196.0 lb

## 2014-05-25 DIAGNOSIS — R931 Abnormal findings on diagnostic imaging of heart and coronary circulation: Secondary | ICD-10-CM

## 2014-05-25 DIAGNOSIS — I10 Essential (primary) hypertension: Secondary | ICD-10-CM

## 2014-05-25 DIAGNOSIS — K219 Gastro-esophageal reflux disease without esophagitis: Secondary | ICD-10-CM

## 2014-05-25 DIAGNOSIS — E785 Hyperlipidemia, unspecified: Secondary | ICD-10-CM

## 2014-05-25 DIAGNOSIS — E118 Type 2 diabetes mellitus with unspecified complications: Secondary | ICD-10-CM

## 2014-05-25 NOTE — Patient Instructions (Signed)
Your physician wants you to follow-up in: YEAR WITH DR NISHAN  You will receive a reminder letter in the mail two months in advance. If you don't receive a letter, please call our office to schedule the follow-up appointment.  Your physician recommends that you continue on your current medications as directed. Please refer to the Current Medication list given to you today. 

## 2014-05-25 NOTE — Assessment & Plan Note (Signed)
Cholesterol is at goal.  Continue current dose of statin and diet Rx.  No myalgias or side effects.  F/U  LFT's in 6 months. Lab Results  Component Value Date   LDLCALC 68 07/30/2013

## 2014-05-25 NOTE — Assessment & Plan Note (Signed)
Discussed low carb diet.  Target hemoglobin A1c is 6.5 or less.  Continue current medications.  

## 2014-05-25 NOTE — Assessment & Plan Note (Signed)
F/U Carolyn [name] needs refill on nexium higher dose 40 mg

## 2014-05-25 NOTE — Assessment & Plan Note (Signed)
Well controlled.  Continue current medications and low sodium Dash type diet.    

## 2014-05-25 NOTE — Assessment & Plan Note (Signed)
EF low normal now with improved BP control  Continue current meds.

## 2014-06-02 ENCOUNTER — Other Ambulatory Visit: Payer: Self-pay | Admitting: Internal Medicine

## 2014-06-21 ENCOUNTER — Encounter: Payer: Self-pay | Admitting: Family

## 2014-06-21 ENCOUNTER — Ambulatory Visit (INDEPENDENT_AMBULATORY_CARE_PROVIDER_SITE_OTHER): Payer: 59 | Admitting: Family

## 2014-06-21 VITALS — BP 120/84 | HR 94 | Temp 98.3°F | Resp 18 | Ht 65.0 in | Wt 190.4 lb

## 2014-06-21 DIAGNOSIS — J019 Acute sinusitis, unspecified: Secondary | ICD-10-CM

## 2014-06-21 DIAGNOSIS — K2100 Gastro-esophageal reflux disease with esophagitis, without bleeding: Secondary | ICD-10-CM

## 2014-06-21 DIAGNOSIS — R300 Dysuria: Secondary | ICD-10-CM

## 2014-06-21 LAB — POCT URINALYSIS DIPSTICK
Bilirubin, UA: NEGATIVE
Ketones, UA: NEGATIVE
Nitrite, UA: NEGATIVE
PH UA: 5
PROTEIN UA: POSITIVE
RBC UA: NEGATIVE
SPEC GRAV UA: 1.025
Urobilinogen, UA: NEGATIVE

## 2014-06-21 MED ORDER — FLUCONAZOLE 150 MG PO TABS: 150.0000 mg | ORAL_TABLET | Freq: Once | ORAL | Status: DC

## 2014-06-21 MED ORDER — PHENAZOPYRIDINE HCL 95 MG PO TABS: 95.0000 mg | ORAL_TABLET | Freq: Three times a day (TID) | ORAL | Status: DC | PRN

## 2014-06-21 MED ORDER — CEFUROXIME AXETIL 250 MG PO TABS: 250.0000 mg | ORAL_TABLET | Freq: Two times a day (BID) | ORAL | Status: DC

## 2014-06-21 MED ORDER — OMEPRAZOLE-SODIUM BICARBONATE 40-1100 MG PO CAPS: 1.0000 | ORAL_CAPSULE | Freq: Every day | ORAL | Status: DC

## 2014-06-21 NOTE — Assessment & Plan Note (Signed)
Refill medication

## 2014-06-21 NOTE — Assessment & Plan Note (Signed)
Symptoms and exam consistent with cystitis. In office urinalysis shows leukorrhea with no nitrites. Start Ceftin. Start Pyridium as needed for dysuria. Follow-up if symptoms worsen or fail to improve.

## 2014-06-21 NOTE — Assessment & Plan Note (Signed)
Symptoms and exam consistent with sinusitis. Start Ceftin. Continue over-the-counter medication as needed for symptom relief and supportive care. Follow-up if symptoms worsen or fail to improve.

## 2014-06-21 NOTE — Progress Notes (Signed)
Subjective:    Patient ID: Carolyn [name], female    DOB: 09-05-1960, 54 y.o.   MRN: [id number]  Chief Complaint  Patient presents with  . Nasal Congestion    x3 days, sinus pressure, sinus headache, congestion, drainage, and chills; x2 days, dysuria and urinary frequency    HPI:  Carolyn [name] is a 54 y.o. female who presents today for an acute visit.  1) Nasal Congestion - This is a new problem. Associated symptoms of sinus pressure, sinus headache, congestion, drainage and chills which has been going on for about 3 days. Modifying factors include Mucinex-DM and tylenol for her headache which have provided minimal relief. Timing of symptoms is worse at night. Denies cough that keeps her up at night. The course of the symptoms has stayed about the same. Denies any recent antibiotic use.   2) Urinary frequency - This is a new problem. Associated symptoms of dysuria and urinary frequency has been gong on for about 2 days. Denies any modifying factors or OTC medication use.   Allergies  Allergen Reactions  . Metformin And Related Nausea And Vomiting  . Codeine Nausea And Vomiting  . Flexeril [Cyclobenzaprine]     sedation  . Morphine And Related Itching  . Reglan [Metoclopramide] Other (See Comments)    "paralyzes me"    Current Outpatient Prescriptions on File Prior to Visit  Medication Sig Dispense Refill  . Albiglutide (TANZEUM) 50 MG PEN Inject 1 Act into the skin once a week. 12 each 3  . aspirin EC 81 MG EC tablet Take 1 tablet (81 mg total) by mouth daily.    . Beclomethasone Dipropionate (QNASL) 80 MCG/ACT AERS Place 4 puffs into the nose daily. 8.7 g 11  . Blood Glucose Monitoring Suppl (ONETOUCH VERIO IQ SYSTEM) W/DEVICE KIT 1 Act by Does not apply route 3 (three) times daily. 2 kit 0  . Calcium Carbonate-Vit D-Min (CALCIUM 1200 PO) Take 1 tablet by mouth daily.    . canagliflozin (INVOKANA) 300 MG TABS tablet Take 300 mg by mouth daily before breakfast. 90 tablet 3    . cetirizine (ZYRTEC) 10 MG chewable tablet Chew 10 mg by mouth daily.    . cloNIDine (CATAPRES) 0.1 MG tablet Take 0.2 mg by mouth daily.     Marland Kitchen diltiazem (CARDIZEM CD) 240 MG 24 hr capsule Take 1 capsule (240 mg total) by mouth daily. 90 capsule 3  . glucose blood (ONE TOUCH ULTRA TEST) test strip Use to test blood sugar three times a week and prn if having symptoms of low blood sugar Dx: 250.92 90 day supply 100 each 11  . glucose blood (ONETOUCH VERIO) test strip Use TID 100 each 12  . labetalol (NORMODYNE) 300 MG tablet Take 1 tablet by mouth two  times daily 120 tablet 1  . losartan (COZAAR) 100 MG tablet Take 1 tablet (100 mg total) by mouth daily. 90 tablet 3  . metoCLOPramide (REGLAN) 10 MG tablet Take 1 tablet (10 mg total) by mouth 4 (four) times daily -  before meals and at bedtime. 120 tablet 1  . Multiple Vitamins-Minerals (MULTIVITAMIN WITH MINERALS) tablet Take 1 tablet by mouth daily.    Marland Kitchen omeprazole-sodium bicarbonate (ZEGERID) 40-1100 MG per capsule Take 1 capsule by mouth daily before breakfast. 90 capsule 3  . promethazine (PHENERGAN) 12.5 MG tablet Take 1 tablet (12.5 mg total) by mouth every 6 (six) hours as needed for nausea or vomiting. 30 tablet 2  . simvastatin (ZOCOR)  40 MG tablet Take 1 tablet (40 mg total) by mouth at bedtime. 90 tablet 3  . triamterene-hydrochlorothiazide (DYAZIDE) 37.5-25 MG per capsule Take 1 capsule by mouth  daily 90 capsule 1   No current facility-administered medications on file prior to visit.    Past Medical History  Diagnosis Date  . Hypertension   . GERD (gastroesophageal reflux disease)   . Dyslipidemia   . Exertional shortness of breath   . Type II diabetes mellitus   . Migraines     "probably weekly" (01/14/2013)  . Anxiety attack   . Allergy     seasonal  . Anemia   . Arthritis   . Heart murmur   . Hyperlipidemia   . Benign fundic gland polyps of stomach     Review of Systems  Constitutional: Positive for chills.  Negative for fever.  HENT: Positive for congestion and sinus pressure.   Genitourinary: Positive for dysuria, urgency, frequency and flank pain. Negative for hematuria.  Neurological: Positive for headaches.      Objective:    BP 120/84 mmHg  Pulse 94  Temp(Src) 98.3 F (36.8 C) (Oral)  Resp 18  Ht '5\' 5"'  (1.651 m)  Wt 190 lb 6.4 oz (86.365 kg)  BMI 31.68 kg/m2  SpO2 99% Nursing note and vital signs reviewed.  Physical Exam  Constitutional: She is oriented to person, place, and time. She appears well-developed and well-nourished. No distress.  HENT:  Right Ear: Hearing, tympanic membrane, external ear and ear canal normal.  Left Ear: Hearing, tympanic membrane, external ear and ear canal normal.  Nose: Right sinus exhibits maxillary sinus tenderness and frontal sinus tenderness. Left sinus exhibits maxillary sinus tenderness and frontal sinus tenderness.  Mouth/Throat: Uvula is midline, oropharynx is clear and moist and mucous membranes are normal.  Cardiovascular: Normal rate, regular rhythm, normal heart sounds and intact distal pulses.   Pulmonary/Chest: Effort normal and breath sounds normal.  Abdominal: There is no CVA tenderness.  Neurological: She is alert and oriented to person, place, and time.  Skin: Skin is warm and dry.  Psychiatric: She has a normal mood and affect. Her behavior is normal. Judgment and thought content normal.       Assessment & Plan:

## 2014-06-21 NOTE — Patient Instructions (Signed)
Thank you for choosing [facility].  Summary/Instructions:  Your prescription(s) have been submitted to your pharmacy or been printed and provided for you. Please take as directed and contact our office if you believe you are having problem(s) with the medication(s) or have any questions.  If your symptoms worsen or fail to improve, please contact our office for further instruction, or in case of emergency go directly to the emergency room at the closest medical facility.   General Recommendations:    Please drink plenty of fluids.  Get plenty of rest   Sleep in humidified air  Use saline nasal sprays  Netti pot   OTC Medications:  Decongestants - helps relieve congestion   Flonase (generic fluticasone) or Nasacort (generic triamcinolone) - please make sure to use the "cross-over" technique at a 45 degree angle towards the opposite eye as opposed to straight up the nasal passageway.   If you have HIGH BLOOD PRESSURE - Coricidin HBP; AVOID any product that is -D as this contains pseudoephedrine which may increase your blood pressure.  Afrin (oxymetazoline) every 6-8 hours for up to 3 days.   Allergies - helps relieve runny nose, itchy eyes and sneezing   Claritin (generic loratidine), Allegra (fexofenidine), or Zyrtec (generic cyrterizine) for runny nose. These medications should not cause drowsiness.  Note - Benadryl (generic diphenhydramine) may be used however may cause drowsiness  Cough -   Delsym or Robitussin (generic dextromethorphan)  Expectorants - helps loosen mucus to ease removal   Mucinex (generic guaifenesin) as directed on the package.  Headaches / General Aches   Tylenol (generic acetaminophen) - DO NOT EXCEED 3 grams (3,000 mg) in a 24 hour time period  Advil/Motrin (generic ibuprofen)   Sore Throat -   Salt water gargle   Chloraseptic (generic benzocaine) spray or lozenges / Sucrets (generic dyclonine)      

## 2014-06-29 ENCOUNTER — Ambulatory Visit: Payer: 59 | Admitting: Audiology

## 2014-07-27 ENCOUNTER — Other Ambulatory Visit: Payer: Self-pay | Admitting: Internal Medicine

## 2014-07-27 DIAGNOSIS — E118 Type 2 diabetes mellitus with unspecified complications: Secondary | ICD-10-CM

## 2014-07-27 MED ORDER — ALBIGLUTIDE 50 MG ~~LOC~~ PEN: 1.0000 | PEN_INJECTOR | SUBCUTANEOUS | Status: DC

## 2014-07-27 NOTE — Telephone Encounter (Signed)
Noted.. Please advise what pt can or should do in place of it.

## 2014-07-27 NOTE — Telephone Encounter (Signed)
She is need of her insulin this morning and hoping she can come on in for it. Please call her. She says you know all about her situation about her having to come in for it.

## 2014-07-27 NOTE — Telephone Encounter (Signed)
Patient requesting Tanzeum 50, however only sample available are for Tanzeum 30. Please advise, pt is out of insulin.

## 2014-07-27 NOTE — Telephone Encounter (Signed)
What is wrong with tanzeum?

## 2014-07-27 NOTE — Telephone Encounter (Signed)
tanzeum is not insulin We don't get 50 mg samples

## 2014-08-01 ENCOUNTER — Telehealth: Payer: Self-pay

## 2014-08-01 DIAGNOSIS — K2100 Gastro-esophageal reflux disease with esophagitis, without bleeding: Secondary | ICD-10-CM

## 2014-08-01 MED ORDER — OMEPRAZOLE-SODIUM BICARBONATE 40-1100 MG PO CAPS: 1.0000 | ORAL_CAPSULE | Freq: Every day | ORAL | Status: DC

## 2014-08-01 NOTE — Telephone Encounter (Signed)
PA for omeprazole has been approved.

## 2014-08-09 ENCOUNTER — Other Ambulatory Visit: Payer: Self-pay | Admitting: Cardiovascular Disease

## 2014-08-12 ENCOUNTER — Ambulatory Visit: Payer: 59 | Attending: Audiology | Admitting: Audiology

## 2014-08-12 DIAGNOSIS — R94120 Abnormal auditory function study: Secondary | ICD-10-CM

## 2014-08-12 DIAGNOSIS — H9193 Unspecified hearing loss, bilateral: Secondary | ICD-10-CM | POA: Insufficient documentation

## 2014-08-12 NOTE — Procedures (Signed)
[place], [place]  [address] [address] EVALUATION  Patient Name: Carolyn [name] [name] Record Number:  [id number] Date of Birth:  11-12-1960     Date of Test:  08/12/2014  HISTORY:  Carolyn [name] [name], a 54 y.o. old female was seen for audiological evaluation upon referral of Carolyn [name] Calico, MD.   Carolyn [name] [name] reported no difficulty hearing and no familial history of hearing loss.  Further, she denied dizziness, headaches, facial numbness and ear pain.  Carolyn [name] [name] chief complaint is high pitch bilateral (worse on the right side) tinnitus which began approximately 3 months ago.   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  Presently, is taking daily medication for hypertension and insulin injections for diabetes.    REPORT OF PAIN:  None  EVALUATION:   Air and bone conduction audiometry from 500Hz  - 8000Hz  utilizing insert earphones revealed a slight sensory neural loss on the right side from 500Hz -2000Hz  and a slight sensory neural loss on the left side at 2000Hz  and 4000Hz .   Speech reception thresholds were consistent with the pure tone results indicative of good test reliability.  Speech recognition testing was conducted in each ear independently, at a comfortable listening level (55-65dBHL) and indicated 96% and 100% in the right and left ears respectively.  Speech recognition abilities while in the presence of a soft background noise (s/n+5) were also evaluated.  Under this condition, scores were 40% in the right and 50% in the left ear.  She is expected to have significant difficulties understanding speech in the presence of a background noise.   Impedance audiometry was utilized and a Type A tympanogram  was obtained on the right side and a Type A tympanogram was obtained on the left side suggesting normal middle ear volume,  pressure and compliance bilaterally.  Acoustic reflexes were tested from 500Hz  - 4000Hz  and were present on the right side and present on the left side.  Distortion Product Otoacoustic Emissions were tested from 2000Hz  - 10000Hz  and were weak on the right side and weak on the left side, indicative of poor outer hair cell function within in the inner ear.  CONCLUSION:   Slight sensory neural loss bilaterally, with normal speech discrimination in quiet but with severely reduced abilities noted when a background noise is present.  DPOAEs are weak and indicative of changes within the outer hair cells of the cochlea and most likely the cause of her bilateral tinnitus.  RECOMMENDATIONS:    1. Hearing should be monitored at least on an annual basis and sooner should there be an increase in symptoms.  2. As the patient is quite comfortable with her hearing status; a hearing aid evaluation is not recommended at this time.  3. To assit tinnitus, a visit to the [facility] may be quite helpful. They can counsel him further regarding his tinnitus and possible offer treatment opportunities. The clinic phone number is: [phone] and they are located at [address] [place], [place]   4. In addition, there are also ways to improve one's listening environment. Such as:  (A) When in a restaurant, sit with your back against a perimeter wall, thus reducing the extraneous noise.   (B) When in meetings, do not sit near an open doorway and position yourself within 8-10ft of the speaker. Front and center is  typically best.  (C) Eliminate background noises at home such as the television, dishwasher, etc when having important conversations.  (D) Ask family members to face you when they speak and not to drop their heads or turn and walkway while still speaking.   5. Because Carolyn [name] has a weakened auditory system she is more at risk from damage of noise exposure than someone with a healthy auditory  system and therefore should avoid excessive nose exposure if possible or certainly utilize ear protection.     Carolyn [name] [name] , Au.D. Doctor of Audiology CCC-A 08/12/2014

## 2014-08-12 NOTE — Patient Instructions (Signed)
  RECOMMENDATIONS:  1. Hearing should be monitored at least on an annual basis and sooner should there be an increase in symptoms.  2. As the patient is quite comfortable with her hearing status; a hearing aid evaluation is not recommended at this time.  3. To assit tinnitus, a visit to the [facility] may be quite helpful. They can counsel him further regarding his tinnitus and possible offer treatment opportunities. The clinic phone number is: [phone] and they are located at [address] [place], [place]  4. In addition, there are also ways to improve one's listening environment. Such as: (A) When in a restaurant, sit with your back against a perimeter wall, thus reducing the extraneous noise. (B) When in meetings, do not sit near an open doorway and position yourself within 8-10ft of the speaker. Front and center is typically best. (C) Eliminate background noises at home such as the television, dishwasher, etc when having important conversations. (D) Ask family members to face you when they speak and not to drop their heads or turn and walkway while still speaking.  5. Because Landi has a weakened auditory system she is more at risk from damage of noise exposure than someone with a healthy auditory system and therefore should avoid excessive nose exposure if possible or certainly utilize ear protection.

## 2014-09-28 ENCOUNTER — Telehealth: Payer: Self-pay | Admitting: Internal Medicine
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
## 2014-10-04 ENCOUNTER — Telehealth: Payer: Self-pay | Admitting: *Deleted
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
## 2014-10-05 ENCOUNTER — Telehealth: Payer: Self-pay | Admitting: Internal Medicine

## 2014-10-05 NOTE — Telephone Encounter (Signed)
Pt came in to drop paper work off, inform pt of the massage below.

## 2014-10-05 NOTE — Telephone Encounter (Signed)
Do you mean tanzeum? She can take that whichever day of the week she wants

## 2014-10-05 NOTE — Telephone Encounter (Signed)
Pt notified of medication advisement via mychart.

## 2014-10-05 NOTE — Telephone Encounter (Signed)
Patient takes her insulin on Wednesdays and she would like to change it to Saturdays and is wondering if the 2 days will make a difference. Please advise

## 2014-10-13 ENCOUNTER — Ambulatory Visit: Payer: 59 | Admitting: Internal Medicine

## 2014-10-13 DIAGNOSIS — Z0289 Encounter for other administrative examinations: Secondary | ICD-10-CM

## 2014-10-21 ENCOUNTER — Telehealth: Payer: Self-pay | Admitting: Internal Medicine

## 2014-10-27 ENCOUNTER — Ambulatory Visit (INDEPENDENT_AMBULATORY_CARE_PROVIDER_SITE_OTHER): Payer: 59 | Admitting: Internal Medicine

## 2014-10-27 ENCOUNTER — Encounter: Payer: Self-pay | Admitting: Internal Medicine

## 2014-10-27 ENCOUNTER — Other Ambulatory Visit (INDEPENDENT_AMBULATORY_CARE_PROVIDER_SITE_OTHER): Payer: 59

## 2014-10-27 VITALS — BP 110/64 | HR 83 | Temp 98.0°F | Resp 16 | Ht 65.0 in | Wt 194.0 lb

## 2014-10-27 DIAGNOSIS — I10 Essential (primary) hypertension: Secondary | ICD-10-CM | POA: Diagnosis not present

## 2014-10-27 DIAGNOSIS — E118 Type 2 diabetes mellitus with unspecified complications: Secondary | ICD-10-CM | POA: Diagnosis not present

## 2014-10-27 DIAGNOSIS — H9319 Tinnitus, unspecified ear: Secondary | ICD-10-CM | POA: Insufficient documentation

## 2014-10-27 DIAGNOSIS — E785 Hyperlipidemia, unspecified: Secondary | ICD-10-CM

## 2014-10-27 DIAGNOSIS — R7989 Other specified abnormal findings of blood chemistry: Secondary | ICD-10-CM

## 2014-10-27 DIAGNOSIS — D510 Vitamin B12 deficiency anemia due to intrinsic factor deficiency: Secondary | ICD-10-CM | POA: Diagnosis not present

## 2014-10-27 DIAGNOSIS — J301 Allergic rhinitis due to pollen: Secondary | ICD-10-CM

## 2014-10-27 DIAGNOSIS — Z1231 Encounter for screening mammogram for malignant neoplasm of breast: Secondary | ICD-10-CM

## 2014-10-27 DIAGNOSIS — H9312 Tinnitus, left ear: Secondary | ICD-10-CM

## 2014-10-27 LAB — LIPID PANEL
CHOL/HDL RATIO: 5
Cholesterol: 170 mg/dL (ref 0–200)
HDL: 34.3 mg/dL — ABNORMAL LOW (ref >39.00)
Triglycerides: 446 mg/dL — ABNORMAL HIGH (ref 0.0–149.0)

## 2014-10-27 LAB — CBC WITH DIFFERENTIAL/PLATELET
BASOS PCT: 0.2 % (ref 0.0–3.0)
Basophils Absolute: 0 10*3/uL (ref 0.0–0.1)
EOS ABS: 0.2 10*3/uL (ref 0.0–0.7)
Eosinophils Relative: 2.3 % (ref 0.0–5.0)
HCT: 35.8 % — ABNORMAL LOW (ref 36.0–46.0)
HEMOGLOBIN: 11.9 g/dL — AB (ref 12.0–15.0)
Lymphocytes Relative: 28 % (ref 12.0–46.0)
Lymphs Abs: 2.5 10*3/uL (ref 0.7–4.0)
MCHC: 33.1 g/dL (ref 30.0–36.0)
MCV: 85.6 fl (ref 78.0–100.0)
Monocytes Absolute: 0.5 10*3/uL (ref 0.1–1.0)
Monocytes Relative: 6 % (ref 3.0–12.0)
NEUTROS PCT: 63.5 % (ref 43.0–77.0)
Neutro Abs: 5.7 10*3/uL (ref 1.4–7.7)
Platelets: 329 10*3/uL (ref 150.0–400.0)
RBC: 4.19 Mil/uL (ref 3.87–5.11)
RDW: 14.6 % (ref 11.5–15.5)
WBC: 8.9 10*3/uL (ref 4.0–10.5)

## 2014-10-27 LAB — IBC PANEL
IRON: 50 ug/dL (ref 42–145)
Saturation Ratios: 14 % — ABNORMAL LOW (ref 20.0–50.0)
Transferrin: 255 mg/dL (ref 212.0–360.0)

## 2014-10-27 LAB — TSH: TSH: 2.4 u[IU]/mL (ref 0.35–4.50)

## 2014-10-27 LAB — BASIC METABOLIC PANEL
BUN: 25 mg/dL — ABNORMAL HIGH (ref 6–23)
CALCIUM: 10.5 mg/dL (ref 8.4–10.5)
CO2: 26 mEq/L (ref 19–32)
CREATININE: 1.1 mg/dL (ref 0.40–1.20)
Chloride: 99 mEq/L (ref 96–112)
GFR: 66.9 mL/min (ref >60.00)
Glucose, Bld: 149 mg/dL — ABNORMAL HIGH (ref 70–99)
Potassium: 4.3 mEq/L (ref 3.5–5.1)
Sodium: 136 mEq/L (ref 135–145)

## 2014-10-27 LAB — URINALYSIS, ROUTINE W REFLEX MICROSCOPIC
Bilirubin Urine: NEGATIVE
HGB URINE DIPSTICK: NEGATIVE
Ketones, ur: NEGATIVE
Leukocytes, UA: NEGATIVE
NITRITE: NEGATIVE
PH: 6 (ref 5.0–8.0)
RBC / HPF: NONE SEEN (ref 0–?)
SPECIFIC GRAVITY, URINE: 1.02 (ref 1.000–1.030)
TOTAL PROTEIN, URINE-UPE24: 100 — AB
Urine Glucose: >=1000 — AB
Urobilinogen, UA: 0.2 (ref 0.0–1.0)
WBC, UA: NONE SEEN (ref 0–?)

## 2014-10-27 LAB — MICROALBUMIN / CREATININE URINE RATIO
CREATININE, U: 161.4 mg/dL
Microalb Creat Ratio: 21.6 mg/g (ref 0.0–30.0)
Microalb, Ur: 34.8 mg/dL — ABNORMAL HIGH (ref 0.0–1.9)

## 2014-10-27 LAB — LDL CHOLESTEROL, DIRECT: Direct LDL: 64 mg/dL

## 2014-10-27 LAB — VITAMIN B12: Vitamin B-12: 677 pg/mL (ref 211–911)

## 2014-10-27 LAB — FERRITIN: Ferritin: 85.7 ng/mL (ref 10.0–291.0)

## 2014-10-27 LAB — FOLATE: Folate: >24.8 ng/mL (ref >5.9)

## 2014-10-27 LAB — HEMOGLOBIN A1C: HEMOGLOBIN A1C: 6.5 % (ref 4.6–6.5)

## 2014-10-27 MED ORDER — FLUTICASONE PROPIONATE 50 MCG/ACT NA SUSP: 2.0000 | Freq: Every day | NASAL | Status: DC

## 2014-10-27 NOTE — Progress Notes (Signed)
Subjective:  Patient ID: Carolyn [name], female    DOB: 1960-07-02  Age: 54 y.o. MRN: [id number]  CC: Diabetes and Hypertension   HPI KERSTIE AGENT presents for a follow-up on diabetes. Her main complaint today is continued tinnitus and loss of hearing in her left ear. She saw an audiologist and was told that she had mild hearing loss but was told that there is nothing that can be done about the tinnitus. She wants to see someone for a second opinion.  Outpatient Prescriptions Prior to Visit  Medication Sig Dispense Refill  . Albiglutide (TANZEUM) 50 MG PEN Inject 1 Act into the skin once a week. 12 each 3  . aspirin EC 81 MG EC tablet Take 1 tablet (81 mg total) by mouth daily.    . Blood Glucose Monitoring Suppl (ONETOUCH VERIO IQ SYSTEM) W/DEVICE KIT 1 Act by Does not apply route 3 (three) times daily. 2 kit 0  . Calcium Carbonate-Vit D-Min (CALCIUM 1200 PO) Take 1 tablet by mouth daily.    . canagliflozin (INVOKANA) 300 MG TABS tablet Take 300 mg by mouth daily before breakfast. 90 tablet 3  . cetirizine (ZYRTEC) 10 MG chewable tablet Chew 10 mg by mouth daily.    . cloNIDine (CATAPRES) 0.1 MG tablet Take 0.2 mg by mouth daily.     Marland Kitchen diltiazem (CARDIZEM CD) 240 MG 24 hr capsule Take 1 capsule (240 mg total) by mouth daily. 90 capsule 3  . glucose blood (ONE TOUCH ULTRA TEST) test strip Use to test blood sugar three times a week and prn if having symptoms of low blood sugar Dx: 250.92 90 day supply 100 each 11  . glucose blood (ONETOUCH VERIO) test strip Use TID 100 each 12  . labetalol (NORMODYNE) 300 MG tablet Take 1 tablet by mouth two  times daily 180 tablet 1  . losartan (COZAAR) 100 MG tablet Take 1 tablet (100 mg total) by mouth daily. 90 tablet 3  . Multiple Vitamins-Minerals (MULTIVITAMIN WITH MINERALS) tablet Take 1 tablet by mouth daily.    Marland Kitchen omeprazole-sodium bicarbonate (ZEGERID) 40-1100 MG per capsule Take 1 capsule by mouth daily before breakfast. 90 capsule 3  .  simvastatin (ZOCOR) 40 MG tablet Take 1 tablet (40 mg total) by mouth at bedtime. 90 tablet 3  . triamterene-hydrochlorothiazide (DYAZIDE) 37.5-25 MG per capsule Take 1 capsule by mouth  daily 90 capsule 1  . Beclomethasone Dipropionate (QNASL) 80 MCG/ACT AERS Place 4 puffs into the nose daily. 8.7 g 11  . cefUROXime (CEFTIN) 250 MG tablet Take 1 tablet (250 mg total) by mouth 2 (two) times daily with a meal. (Patient not taking: Reported on 10/27/2014) 20 tablet 0  . fluconazole (DIFLUCAN) 150 MG tablet Take 1 tablet (150 mg total) by mouth once. (Patient not taking: Reported on 10/27/2014) 1 tablet 2  . metoCLOPramide (REGLAN) 10 MG tablet Take 1 tablet (10 mg total) by mouth 4 (four) times daily -  before meals and at bedtime. (Patient not taking: Reported on 10/27/2014) 120 tablet 1  . phenazopyridine (PYRIDIUM) 95 MG tablet Take 1 tablet (95 mg total) by mouth 3 (three) times daily as needed for pain. (Patient not taking: Reported on 10/27/2014) 10 tablet 0  . promethazine (PHENERGAN) 12.5 MG tablet Take 1 tablet (12.5 mg total) by mouth every 6 (six) hours as needed for nausea or vomiting. (Patient not taking: Reported on 10/27/2014) 30 tablet 2   No facility-administered medications prior to visit.  ROS Review of Systems  Constitutional: Negative.  Negative for fever, chills, diaphoresis, appetite change and fatigue.  HENT: Positive for congestion, hearing loss, postnasal drip, rhinorrhea and tinnitus. Negative for ear discharge, ear pain, facial swelling, sinus pressure, sneezing, sore throat and trouble swallowing.   Eyes: Negative.  Negative for visual disturbance.  Respiratory: Negative.  Negative for cough, choking, chest tightness, shortness of breath and stridor.   Cardiovascular: Negative.  Negative for chest pain, palpitations and leg swelling.  Gastrointestinal: Negative.  Negative for nausea, vomiting, abdominal pain, diarrhea, constipation and blood in stool.  Endocrine: Negative.   Negative for polydipsia, polyphagia and polyuria.  Genitourinary: Negative.   Musculoskeletal: Negative.  Negative for myalgias, back pain, joint swelling and arthralgias.  Skin: Negative.  Negative for rash.  Allergic/Immunologic: Negative.   Neurological: Negative.   Hematological: Negative.   Psychiatric/Behavioral: Negative.     Objective:  BP 110/64 mmHg  Pulse 83  Temp(Src) 98 F (36.7 C) (Oral)  Resp 16  Ht '5\' 5"'  (1.651 m)  Wt 194 lb (87.998 kg)  BMI 32.28 kg/m2  SpO2 95%  BP Readings from Last 3 Encounters:  10/27/14 110/64  06/21/14 120/84  05/25/14 114/76    Wt Readings from Last 3 Encounters:  10/27/14 194 lb (87.998 kg)  06/21/14 190 lb 6.4 oz (86.365 kg)  05/25/14 196 lb (88.905 kg)    Physical Exam  Constitutional: She is oriented to person, place, and time. No distress.  HENT:  Right Ear: Hearing, tympanic membrane, external ear and ear canal normal.  Left Ear: Hearing, tympanic membrane, external ear and ear canal normal.  Nose: No mucosal edema or rhinorrhea. Right sinus exhibits no maxillary sinus tenderness and no frontal sinus tenderness. Left sinus exhibits no maxillary sinus tenderness and no frontal sinus tenderness.  Mouth/Throat: Oropharynx is clear and moist. No oropharyngeal exudate.  Eyes: Conjunctivae are normal. Right eye exhibits no discharge. Left eye exhibits no discharge. No scleral icterus.  Neck: Normal range of motion. Neck supple. No JVD present. No tracheal deviation present. No thyromegaly present.  Cardiovascular: Normal rate, regular rhythm and intact distal pulses.  Exam reveals no gallop and no friction rub.   No murmur heard. Pulmonary/Chest: Effort normal and breath sounds normal. No stridor. No respiratory distress. She has no wheezes. She has no rales. She exhibits no tenderness.  Abdominal: Soft. Bowel sounds are normal. She exhibits no distension and no mass. There is no tenderness. There is no rebound and no guarding.    Musculoskeletal: Normal range of motion. She exhibits no edema or tenderness.  Lymphadenopathy:    She has no cervical adenopathy.  Neurological: She is oriented to person, place, and time.  Skin: Skin is warm and dry. No rash noted. She is not diaphoretic. No erythema. No pallor.  Psychiatric: She has a normal mood and affect. Her behavior is normal. Judgment and thought content normal.  Vitals reviewed.   Lab Results  Component Value Date   WBC 8.9 10/27/2014   HGB 11.9* 10/27/2014   HCT 35.8* 10/27/2014   PLT 329.0 10/27/2014   GLUCOSE 149* 10/27/2014   CHOL 170 10/27/2014   TRIG * 10/27/2014    446.0 Triglyceride is over 400; calculations on Lipids are invalid.   HDL 34.30* 10/27/2014   LDLDIRECT 64.0 10/27/2014   LDLCALC 68 07/30/2013   ALT 21 07/30/2013   AST 21 07/30/2013   NA 136 10/27/2014   K 4.3 10/27/2014   CL 99 10/27/2014   CREATININE 1.10  10/27/2014   BUN 25* 10/27/2014   CO2 26 10/27/2014   TSH 2.40 10/27/2014   INR 1.04 01/14/2013   HGBA1C 6.5 10/27/2014   MICROALBUR 34.8* 10/27/2014    Nm Gastric Emptying  04/14/2014   CLINICAL DATA:  Six month history of nausea and vomiting  EXAM: NUCLEAR MEDICINE GASTRIC EMPTYING SCAN  Views:  LAO stomach  Radionuclide:  Technetium 13msulfur colloid in egg  Dose:  2.2 mCi  Route of administration: Oral  COMPARISON:  None.  FINDINGS: Images and count statistics were obtained serially over a 2 hr time span. There is essentially no emptying of solid material from the stomach after 2 hr. This finding is markedly abnormal.  IMPRESSION: Essentially no emptying of solid material from the stomach after 2 hr. This finding is distinctly abnormal and raises concern for either gastric paresis or gastric outlet obstruction to solid material.   Electronically Signed   By: WLowella GripM.D.   On: 04/14/2014 10:47    Assessment & Plan:   GKearstynwas seen today for diabetes and hypertension.  Diagnoses and all orders for this  visit:  Type II diabetes mellitus with manifestations- her blood sugars are well controlled, renal function is stable. Orders: -     Basic metabolic panel; Future -     Hemoglobin A1c; Future -     Microalbumin / creatinine urine ratio; Future -     Lipid panel; Future  Essential hypertension- her blood pressure is well-controlled, lites and renal function are stable. Orders: -     Basic metabolic panel; Future -     Urinalysis, [facility] w reflex microscopic [facility]; Future  Pernicious anemia- her hemoglobin and hematocrit have improved some, her vitamin levels are all normal, she's had a GI workup, this appears to be the anemia of chronic disease. Orders: -     CBC with Differential/Platelet; Future -     Ferritin; Future -     IBC panel; Future -     Vitamin B12; Future -     Folate; Future  Hyperlipidemia with target LDL less than 100- she has achieved her LDL goal but her triglycerides are elevated, she agrees to work on her lifestyle modifications. Orders: -     Lipid panel; Future -     TSH; Future  Visit for screening mammogram Orders: -     MM DIGITAL SCREENING BILATERAL; Future  Tinnitus aurium, left Orders: -     Ambulatory referral to ENT  Allergic rhinitis due to pollen- Qnasl was too expensive, will try Flonase. Orders: -     fluticasone (FLONASE) 50 MCG/ACT nasal spray; Place 2 sprays into both nostrils daily.   I have discontinued Ms. Moccio's promethazine, metoCLOPramide, Beclomethasone Dipropionate, cefUROXime, fluconazole, and phenazopyridine. I am also having her start on fluticasone. Additionally, I am having her maintain her aspirin, glucose blood, cloNIDine, simvastatin, cetirizine, multivitamin with minerals, Calcium Carbonate-Vit D-Min (CALCIUM 1200 PO), canagliflozin, glucose blood, ONETOUCH VERIO IQ SYSTEM, diltiazem, losartan, triamterene-hydrochlorothiazide, Albiglutide, omeprazole-sodium bicarbonate, and labetalol.  Meds ordered this  encounter  Medications  . fluticasone (FLONASE) 50 MCG/ACT nasal spray    Sig: Place 2 sprays into both nostrils daily.    Dispense:  48 g    Refill:  3     Follow-up: Return in about 4 months (around 02/27/2015).  TScarlette Calico MD

## 2014-10-27 NOTE — Progress Notes (Signed)
Pre visit review using our clinic review tool, if applicable. No additional management support is needed unless otherwise documented below in the visit note. 

## 2014-10-27 NOTE — Patient Instructions (Signed)

## 2014-11-02 LAB — HM PAP SMEAR

## 2014-12-01 ENCOUNTER — Ambulatory Visit: Payer: 59

## 2015-01-02 ENCOUNTER — Other Ambulatory Visit: Payer: Self-pay

## 2015-01-02 MED ORDER — SIMVASTATIN 40 MG PO TABS: 40.0000 mg | ORAL_TABLET | Freq: Every day | ORAL | Status: DC

## 2015-01-13 ENCOUNTER — Telehealth: Payer: Self-pay | Admitting: *Deleted

## 2015-01-13 MED ORDER — DILTIAZEM HCL ER COATED BEADS 240 MG PO CP24: 240.0000 mg | ORAL_CAPSULE | Freq: Every day | ORAL | Status: DC

## 2015-01-13 NOTE — Telephone Encounter (Signed)
Receive call pt states she haven't receive her mail order on her Diltiazem. Wanting to get 30 day sent to local pharmacy. Verified pharmacy inform will send to [place]...[name]

## 2015-01-19 ENCOUNTER — Other Ambulatory Visit: Payer: Self-pay | Admitting: Cardiovascular Disease

## 2015-01-19 ENCOUNTER — Other Ambulatory Visit: Payer: Self-pay | Admitting: Internal Medicine

## 2015-01-25 ENCOUNTER — Telehealth: Payer: Self-pay | Admitting: *Deleted

## 2015-01-25 DIAGNOSIS — K2100 Gastro-esophageal reflux disease with esophagitis, without bleeding: Secondary | ICD-10-CM

## 2015-01-25 MED ORDER — OMEPRAZOLE-SODIUM BICARBONATE 40-1100 MG PO CAPS
1.0000 | ORAL_CAPSULE | Freq: Every day | ORAL | Status: DC
Start: 2015-01-25 — End: 2015-05-25

## 2015-01-25 NOTE — Telephone Encounter (Signed)
Received call pt is requesting refills on her omeprazole. Verified pharmacy inform will send to [place]...[name]

## 2015-01-26 ENCOUNTER — Telehealth: Payer: Self-pay | Admitting: *Deleted

## 2015-01-26 MED ORDER — LOSARTAN POTASSIUM 100 MG PO TABS: 100.0000 mg | ORAL_TABLET | Freq: Every day | ORAL | Status: DC

## 2015-01-26 NOTE — Telephone Encounter (Signed)
Received call pt states she hasn't gotten [facility] rx sent to [facility]. Inform pt sending electronically...Carolyn [name]

## 2015-02-09 ENCOUNTER — Telehealth: Payer: Self-pay | Admitting: *Deleted

## 2015-02-09 ENCOUNTER — Other Ambulatory Visit: Payer: Self-pay | Admitting: Internal Medicine

## 2015-02-09 NOTE — Telephone Encounter (Signed)
Left msg on triage stating she is waiting on [facility] to send her insulin (Tanzeum) wanting to know if she can pick up samples. Notified pt will leave for pick-up...lmb

## 2015-02-13 ENCOUNTER — Telehealth: Payer: Self-pay | Admitting: *Deleted

## 2015-02-13 MED ORDER — SIMVASTATIN 40 MG PO TABS: 40.0000 mg | ORAL_TABLET | Freq: Every day | ORAL | Status: DC

## 2015-02-13 NOTE — Telephone Encounter (Signed)
Left msg on triage needing rx sent to [facility] on her simvastatin. Called pt back inform rx has been sent...Carolyn [name]

## 2015-02-18 ENCOUNTER — Other Ambulatory Visit: Payer: Self-pay | Admitting: Internal Medicine

## 2015-03-01 ENCOUNTER — Other Ambulatory Visit: Payer: Self-pay | Admitting: *Deleted

## 2015-03-01 DIAGNOSIS — E118 Type 2 diabetes mellitus with unspecified complications: Secondary | ICD-10-CM

## 2015-03-01 DIAGNOSIS — Z794 Long term (current) use of insulin: Principal | ICD-10-CM

## 2015-03-01 MED ORDER — ALBIGLUTIDE 50 MG ~~LOC~~ PEN: 1.0000 | PEN_INJECTOR | SUBCUTANEOUS | Status: DC

## 2015-03-01 NOTE — Telephone Encounter (Signed)
Left msg on triage requesting refills on her Tanderum insulin. Sent to [place]...[name]

## 2015-03-07 ENCOUNTER — Other Ambulatory Visit: Payer: Self-pay | Admitting: Family

## 2015-04-19 ENCOUNTER — Telehealth: Payer: Self-pay | Admitting: *Deleted

## 2015-04-19 MED ORDER — LABETALOL HCL 300 MG PO TABS: ORAL_TABLET | ORAL | Status: DC

## 2015-04-19 NOTE — Telephone Encounter (Signed)
Receive call pt requesting refill on her Labetalol sent to [facility]. Sent electronically...[name]

## 2015-05-05 ENCOUNTER — Other Ambulatory Visit: Payer: Self-pay | Admitting: Internal Medicine

## 2015-05-06 ENCOUNTER — Other Ambulatory Visit: Payer: Self-pay | Admitting: Internal Medicine

## 2015-05-08 ENCOUNTER — Other Ambulatory Visit: Payer: Self-pay | Admitting: *Deleted

## 2015-05-08 MED ORDER — CLONIDINE HCL 0.1 MG PO TABS: 0.2000 mg | ORAL_TABLET | Freq: Every day | ORAL | Status: DC

## 2015-05-08 NOTE — Telephone Encounter (Signed)
Received call pt is requesting refill on her clonidine. Verified pharmacy inform will send to [place]...[name]

## 2015-05-11 ENCOUNTER — Other Ambulatory Visit: Payer: Self-pay | Admitting: Internal Medicine

## 2015-05-15 ENCOUNTER — Telehealth: Payer: Self-pay | Admitting: Internal Medicine

## 2015-05-15 NOTE — Telephone Encounter (Signed)
Spoke to pt at length re: insurance coverage and Tier 1 status of her PCP.  She has Dr. Elsie Stain listed on card, but she would need to request a transfer instead of just listing a provider on her [facility] card.  She is to call her insurance to verify that Dr. Scarlette Calico is also Tier 1 and she will call back if she needs to schedule at [facility].  Otherwise, she will be staying with her current PCP.

## 2015-05-16 ENCOUNTER — Other Ambulatory Visit: Payer: Self-pay | Admitting: Internal Medicine

## 2015-05-24 NOTE — Progress Notes (Signed)
Patient ID: Carolyn [name], female   DOB: Nov 30, 1960, 55 y.o.   MRN: [id number] 55 y.o. with HTN Echo done 5/6 /15 with EF 50% mild diffuse hypokinesis Seen by pulmonary for ? Sleep apnea but does not qualify for CPAP  She is not happy with her primary MD Does not feel that her BP is well controlled. He has not addressed her anemia and he is "scaring" me  About my heart.  She had cath 10/14 with no CAD Ef was normal No RAS and EDP 14  She is compliant with her meds  Has fatigue malaise and some anxiety  Echo 5/15 Study Conclusions  - Left ventricle: The cavity size was normal. Wall thickness was normal. The estimated ejection fraction was 50%. Diffuse hypokinesis. Doppler parameters are consistent with abnormal left ventricular relaxation (grade 1 diastolic dysfunction). - Aortic valve: There was no stenosis. - Mitral valve: No significant regurgitation. - Right ventricle: The cavity size was normal. Systolic function was normal. - Pulmonary arteries: No complete TR doppler jet so unable to estimate PA systolic pressure. - Inferior vena cava: The vessel was normal in size; the respirophasic diameter changes were in the normal range (= 50%); findings are consistent with normal central venous pressure. Impressions:  - Normal LV size with mild global hypokinesis, EF 50%. Normal RV size and systolic function. No significant valvular abnormalities.  Last visit started on labatolol  For better BP control  Doing well Still having some GI issues with dysmotility  Sees Carlean Purl Could not tolerate Reglan  Two older sons Just got divorced from husband of 21 years  ROS: Denies fever, malais, weight loss, blurry vision, decreased visual acuity, cough, sputum, SOB, hemoptysis, pleuritic pain, palpitaitons, heartburn, abdominal pain, melena, lower extremity edema, claudication, or rash.  All other systems reviewed and negative  General: Affect appropriate Healthy:  appears stated age 55:  normal Neck supple with no adenopathy JVP normal no bruits no thyromegaly Lungs clear with no wheezing and good diaphragmatic motion Heart:  S1/S2 no murmur, no rub, gallop or click PMI normal Abdomen: benighn, BS positve, no tenderness, no AAA no bruit.  No HSM or HJR Distal pulses intact with no bruits No edema Neuro non-focal Skin warm and dry No muscular weakness   Current Outpatient Prescriptions  Medication Sig Dispense Refill  . aspirin EC 81 MG EC tablet Take 1 tablet (81 mg total) by mouth daily.    . Blood Glucose Monitoring Suppl (ONETOUCH VERIO IQ SYSTEM) W/DEVICE KIT 1 Act by Does not apply route 3 (three) times daily. 2 kit 0  . Calcium Carbonate-Vit D-Min (CALCIUM 1200 PO) Take 1 tablet by mouth daily.    . cetirizine (ZYRTEC) 10 MG chewable tablet Chew 10 mg by mouth daily.    . cloNIDine (CATAPRES) 0.1 MG tablet Take 2 tablets (0.2 mg total) by mouth daily. 60 tablet 5  . Exenatide ER (BYDUREON) 2 MG PEN Inject 1 Act into the skin once a week. 4 each 11  . fluticasone (FLONASE) 50 MCG/ACT nasal spray Place 2 sprays into both nostrils daily. 48 g 3  . glucose blood (ONE TOUCH ULTRA TEST) test strip Use to test blood sugar three times a week and prn if having symptoms of low blood sugar Dx: 250.92 90 day supply 100 each 11  . glucose blood (ONETOUCH VERIO) test strip Use TID 100 each 12  . INVOKANA 300 MG TABS tablet Take 1 tablet by mouth  daily before breakfast 90 tablet 1  .  labetalol (NORMODYNE) 300 MG tablet Take 1 tablet by mouth two  times daily 180 tablet 0  . losartan (COZAAR) 100 MG tablet Take 1 tablet (100 mg total) by mouth daily. 90 tablet 3  . losartan (COZAAR) 100 MG tablet Take 1 tablet by mouth  daily 90 tablet 0  . Multiple Vitamins-Minerals (MULTIVITAMIN WITH MINERALS) tablet Take 1 tablet by mouth daily.    Marland Kitchen omeprazole (PRILOSEC) 40 MG capsule Take 1 capsule by mouth  twice daily at 8 am and 10  pm 180 capsule 0  . simvastatin (ZOCOR) 40 MG tablet  Take 1 tablet (40 mg total) by mouth at bedtime. 90 tablet 3  . triamterene-hydrochlorothiazide (DYAZIDE) 37.5-25 MG capsule TAKE ONE CAPSULE BY MOUTH ONCE DAILY 90 capsule 0   No current facility-administered medications for this visit.    Allergies  Metformin and related; Codeine; Flexeril; Morphine and related; and Reglan  Electrocardiogram:  SR LVH  11/22/13   Assessment and Plan DCM: improved EF on last echo f/u echo 08/2015 continue current meds HTN: Well controlled.  Continue current medications and low sodium Dash type diet.   Anemia f/u primary  Dysmotility fiu GO DM: Discussed low carb diet.  Target hemoglobin A1c is 6.5 or less.  Continue current medications.   Jenkins Rouge

## 2015-05-25 ENCOUNTER — Ambulatory Visit (INDEPENDENT_AMBULATORY_CARE_PROVIDER_SITE_OTHER): Payer: 59 | Admitting: Internal Medicine

## 2015-05-25 ENCOUNTER — Ambulatory Visit (INDEPENDENT_AMBULATORY_CARE_PROVIDER_SITE_OTHER): Payer: 59 | Admitting: Cardiovascular Disease

## 2015-05-25 ENCOUNTER — Encounter: Payer: Self-pay | Admitting: Internal Medicine

## 2015-05-25 ENCOUNTER — Other Ambulatory Visit (INDEPENDENT_AMBULATORY_CARE_PROVIDER_SITE_OTHER): Payer: 59

## 2015-05-25 ENCOUNTER — Encounter: Payer: Self-pay | Admitting: Cardiovascular Disease

## 2015-05-25 VITALS — BP 130/90 | HR 85 | Ht 65.0 in | Wt 193.8 lb

## 2015-05-25 VITALS — BP 118/82 | HR 75 | Temp 98.2°F | Resp 16 | Wt 192.0 lb

## 2015-05-25 DIAGNOSIS — I429 Cardiomyopathy, unspecified: Secondary | ICD-10-CM | POA: Diagnosis not present

## 2015-05-25 DIAGNOSIS — E118 Type 2 diabetes mellitus with unspecified complications: Secondary | ICD-10-CM

## 2015-05-25 DIAGNOSIS — Z0000 Encounter for general adult medical examination without abnormal findings: Secondary | ICD-10-CM | POA: Insufficient documentation

## 2015-05-25 DIAGNOSIS — D510 Vitamin B12 deficiency anemia due to intrinsic factor deficiency: Secondary | ICD-10-CM | POA: Diagnosis not present

## 2015-05-25 DIAGNOSIS — E785 Hyperlipidemia, unspecified: Secondary | ICD-10-CM

## 2015-05-25 DIAGNOSIS — R011 Cardiac murmur, unspecified: Secondary | ICD-10-CM

## 2015-05-25 DIAGNOSIS — I10 Essential (primary) hypertension: Secondary | ICD-10-CM

## 2015-05-25 DIAGNOSIS — R931 Abnormal findings on diagnostic imaging of heart and coronary circulation: Secondary | ICD-10-CM

## 2015-05-25 DIAGNOSIS — E781 Pure hyperglyceridemia: Secondary | ICD-10-CM | POA: Insufficient documentation

## 2015-05-25 DIAGNOSIS — I426 Alcoholic cardiomyopathy: Secondary | ICD-10-CM | POA: Diagnosis not present

## 2015-05-25 LAB — BASIC METABOLIC PANEL
BUN: 17 mg/dL (ref 6–23)
CHLORIDE: 100 meq/L (ref 96–112)
CO2: 28 mEq/L (ref 19–32)
Calcium: 10.3 mg/dL (ref 8.4–10.5)
Creatinine, Ser: 0.97 mg/dL (ref 0.40–1.20)
GFR: 77.18 mL/min (ref >60.00)
GLUCOSE: 133 mg/dL — AB (ref 70–99)
POTASSIUM: 3.7 meq/L (ref 3.5–5.1)
SODIUM: 136 meq/L (ref 135–145)

## 2015-05-25 LAB — HEMOGLOBIN A1C: HEMOGLOBIN A1C: 6.6 % — AB (ref 4.6–6.5)

## 2015-05-25 LAB — CBC WITH DIFFERENTIAL/PLATELET
BASOS PCT: 0.7 % (ref 0.0–3.0)
Basophils Absolute: 0.1 10*3/uL (ref 0.0–0.1)
EOS PCT: 3.4 % (ref 0.0–5.0)
Eosinophils Absolute: 0.3 10*3/uL (ref 0.0–0.7)
HCT: 36.8 % (ref 36.0–46.0)
HEMOGLOBIN: 12.3 g/dL (ref 12.0–15.0)
Lymphocytes Relative: 30.4 % (ref 12.0–46.0)
Lymphs Abs: 2.8 10*3/uL (ref 0.7–4.0)
MCHC: 33.4 g/dL (ref 30.0–36.0)
MCV: 85.5 fl (ref 78.0–100.0)
MONO ABS: 0.6 10*3/uL (ref 0.1–1.0)
MONOS PCT: 6.7 % (ref 3.0–12.0)
Neutro Abs: 5.3 10*3/uL (ref 1.4–7.7)
Neutrophils Relative %: 58.8 % (ref 43.0–77.0)
Platelets: 348 10*3/uL (ref 150.0–400.0)
RBC: 4.3 Mil/uL (ref 3.87–5.11)
RDW: 14.1 % (ref 11.5–15.5)
WBC: 9.1 10*3/uL (ref 4.0–10.5)

## 2015-05-25 LAB — LIPID PANEL
CHOL/HDL RATIO: 5
CHOLESTEROL: 192 mg/dL (ref 0–200)
HDL: 38.6 mg/dL — ABNORMAL LOW (ref >39.00)
NonHDL: 153.86
Triglycerides: 382 mg/dL — ABNORMAL HIGH (ref 0.0–149.0)
VLDL: 76.4 mg/dL — ABNORMAL HIGH (ref 0.0–40.0)

## 2015-05-25 LAB — LDL CHOLESTEROL, DIRECT: Direct LDL: 87 mg/dL

## 2015-05-25 MED ORDER — EXENATIDE ER 2 MG ~~LOC~~ PEN: 1.0000 | PEN_INJECTOR | SUBCUTANEOUS | Status: DC

## 2015-05-25 NOTE — Progress Notes (Signed)
Pre visit review using our clinic review tool, if applicable. No additional management support is needed unless otherwise documented below in the visit note. 

## 2015-05-25 NOTE — Patient Instructions (Addendum)
Medication Instructions:  Your physician recommends that you continue on your current medications as directed. Please refer to the Current Medication list given to you today.  Labwork: NONE  Testing/Procedures: Your physician has requested that you have an echocardiogram in May. Echocardiography is a painless test that uses sound waves to create images of your heart. It provides your doctor with information about the size and shape of your heart and how well your heart's chambers and valves are working. This procedure takes approximately one hour. There are no restrictions for this procedure.  Follow-Up: Your physician wants you to follow-up in: 12 months with Dr. Johnsie Cancel. You will receive a reminder letter in the mail two months in advance. If you don't receive a letter, please call our office to schedule the follow-up appointment.  If you need a refill on your cardiac medications before your next appointment, please call your pharmacy.

## 2015-05-25 NOTE — Progress Notes (Signed)
Subjective:  Patient ID: Carolyn [name], female    DOB: March 07, 1961  Age: 55 y.o. MRN: [id number]  CC: Hyperlipidemia; Hypertension; and Diabetes   HPI Carolyn [name] presents for a follow-up on diabetes, she tells me that at home her blood sugars are usually in the 130 to 150 range rarely down to 120. She complains about the cost of Tanzeum. She has not recently been very effective with her lifestyle modifications and thinks she may have gained weight. She offers no other symptoms today.  Outpatient Prescriptions Prior to Visit  Medication Sig Dispense Refill  . aspirin EC 81 MG EC tablet Take 1 tablet (81 mg total) by mouth daily.    . Blood Glucose Monitoring Suppl (ONETOUCH VERIO IQ SYSTEM) W/DEVICE KIT 1 Act by Does not apply route 3 (three) times daily. 2 kit 0  . Calcium Carbonate-Vit D-Min (CALCIUM 1200 PO) Take 1 tablet by mouth daily.    . cetirizine (ZYRTEC) 10 MG chewable tablet Chew 10 mg by mouth daily.    . cloNIDine (CATAPRES) 0.1 MG tablet Take 2 tablets (0.2 mg total) by mouth daily. 60 tablet 5  . fluticasone (FLONASE) 50 MCG/ACT nasal spray Place 2 sprays into both nostrils daily. 48 g 3  . glucose blood (ONE TOUCH ULTRA TEST) test strip Use to test blood sugar three times a week and prn if having symptoms of low blood sugar Dx: 250.92 90 day supply 100 each 11  . glucose blood (ONETOUCH VERIO) test strip Use TID 100 each 12  . INVOKANA 300 MG TABS tablet Take 1 tablet by mouth  daily before breakfast 90 tablet 1  . labetalol (NORMODYNE) 300 MG tablet Take 1 tablet by mouth two  times daily 180 tablet 0  . losartan (COZAAR) 100 MG tablet Take 1 tablet (100 mg total) by mouth daily. 90 tablet 3  . losartan (COZAAR) 100 MG tablet Take 1 tablet by mouth  daily 90 tablet 0  . Multiple Vitamins-Minerals (MULTIVITAMIN WITH MINERALS) tablet Take 1 tablet by mouth daily.    Marland Kitchen omeprazole (PRILOSEC) 40 MG capsule Take 1 capsule by mouth  twice daily at 8 am and 10  pm 180 capsule  0  . simvastatin (ZOCOR) 40 MG tablet Take 1 tablet (40 mg total) by mouth at bedtime. 90 tablet 3  . triamterene-hydrochlorothiazide (DYAZIDE) 37.5-25 MG capsule TAKE ONE CAPSULE BY MOUTH ONCE DAILY 90 capsule 0  . Albiglutide (TANZEUM) 50 MG PEN Inject 1 Act into the skin once a week. 12 each 3  . diltiazem (CARDIZEM CD) 240 MG 24 hr capsule Take 1 capsule by mouth  daily 90 capsule 3  . fluconazole (DIFLUCAN) 150 MG tablet TAKE ONE TABLET BY MOUTH ONCE 1 tablet 0  . omeprazole-sodium bicarbonate (ZEGERID) 40-1100 MG capsule Take 1 capsule by mouth daily before breakfast. 90 capsule 3  . triamterene-hydrochlorothiazide (DYAZIDE) 37.5-25 MG per capsule Take 1 capsule by mouth  daily 90 capsule 1   No facility-administered medications prior to visit.    ROS Review of Systems  Constitutional: Positive for unexpected weight change. Negative for fever, chills, diaphoresis, appetite change and fatigue.  HENT: Negative.   Eyes: Negative.   Respiratory: Negative.  Negative for cough, choking, chest tightness, shortness of breath and stridor.   Cardiovascular: Negative.  Negative for chest pain, palpitations and leg swelling.  Gastrointestinal: Negative.  Negative for nausea, vomiting, abdominal pain, diarrhea and constipation.  Endocrine: Negative.  Negative for polydipsia, polyphagia and polyuria.  Genitourinary: Negative.  Negative for dysuria and difficulty urinating.  Musculoskeletal: Negative.  Negative for myalgias, back pain, joint swelling and arthralgias.  Skin: Negative.  Negative for color change and rash.  Allergic/Immunologic: Negative.   Neurological: Negative.  Negative for dizziness and headaches.  Hematological: Negative.  Negative for adenopathy. Does not bruise/bleed easily.  Psychiatric/Behavioral: Negative.     Objective:  BP 118/82 mmHg  Pulse 75  Temp(Src) 98.2 F (36.8 C) (Oral)  Resp 16  Wt 192 lb (87.091 kg)  SpO2 98%  BP Readings from Last 3 Encounters:    05/25/15 130/90  05/25/15 118/82  10/27/14 110/64    Wt Readings from Last 3 Encounters:  05/25/15 193 lb 12.8 oz (87.907 kg)  05/25/15 192 lb (87.091 kg)  10/27/14 194 lb (87.998 kg)    Physical Exam  Constitutional: She is oriented to person, place, and time. No distress.  HENT:  Mouth/Throat: Oropharynx is clear and moist. No oropharyngeal exudate.  Eyes: Conjunctivae are normal. Right eye exhibits no discharge. Left eye exhibits no discharge. No scleral icterus.  Neck: Normal range of motion. Neck supple. No JVD present. No tracheal deviation present. No thyromegaly present.  Cardiovascular: Normal rate and intact distal pulses.  Exam reveals no gallop.   Murmur heard. Pulmonary/Chest: Effort normal and breath sounds normal. No stridor. No respiratory distress. She has no wheezes. She has no rales. She exhibits no tenderness.  Abdominal: Soft. Bowel sounds are normal. She exhibits no distension and no mass. There is no tenderness. There is no rebound and no guarding.  Musculoskeletal: Normal range of motion. She exhibits no edema or tenderness.  Lymphadenopathy:    She has no cervical adenopathy.  Neurological: She is oriented to person, place, and time.  Skin: Skin is warm and dry. No rash noted. She is not diaphoretic. No erythema. No pallor.  Vitals reviewed.   Lab Results  Component Value Date   WBC 9.1 05/25/2015   HGB 12.3 05/25/2015   HCT 36.8 05/25/2015   PLT 348.0 05/25/2015   GLUCOSE 133* 05/25/2015   CHOL 192 05/25/2015   TRIG 382.0* 05/25/2015   HDL 38.60* 05/25/2015   LDLDIRECT 87.0 05/25/2015   LDLCALC 68 07/30/2013   ALT 21 07/30/2013   AST 21 07/30/2013   NA 136 05/25/2015   K 3.7 05/25/2015   CL 100 05/25/2015   CREATININE 0.97 05/25/2015   BUN 17 05/25/2015   CO2 28 05/25/2015   TSH 2.40 10/27/2014   INR 1.04 01/14/2013   HGBA1C 6.6* 05/25/2015   MICROALBUR 34.8* 10/27/2014    Nm Gastric Emptying  04/14/2014  CLINICAL DATA:  Six month  history of nausea and vomiting EXAM: NUCLEAR MEDICINE GASTRIC EMPTYING SCAN Views:  LAO stomach Radionuclide:  Technetium 44msulfur colloid in egg Dose:  2.2 mCi Route of administration: Oral COMPARISON:  None. FINDINGS: Images and count statistics were obtained serially over a 2 hr time span. There is essentially no emptying of solid material from the stomach after 2 hr. This finding is markedly abnormal. IMPRESSION: Essentially no emptying of solid material from the stomach after 2 hr. This finding is distinctly abnormal and raises concern for either gastric paresis or gastric outlet obstruction to solid material. Electronically Signed   By: WLowella GripM.D.   On: 04/14/2014 10:47    Assessment & Plan:   GDylannwas seen today for hyperlipidemia, hypertension and diabetes.  Diagnoses and all orders for this visit:  Essential hypertension - her blood pressure is  well-controlled, electrolytes and renal function are stable. -     Basic metabolic panel; Future  Type 2 diabetes mellitus with complication, without long-term current use of insulin ([place])- her blood sugars are well-controlled, will change Tanzeum to Bydureon for lower cost, her blood sugars are well-controlled with an A1c of 6.6% -     Lipid panel; Future -     Basic metabolic panel; Future -     Hemoglobin A1c; Future -     Exenatide ER (BYDUREON) 2 MG PEN; Inject 1 Act into the skin once a week.  Pernicious anemia- improvement noted -     CBC with Differential/Platelet; Future  Hyperlipidemia with target LDL less than 100- she has achieved her LDL goal is doing well on the statin -     Lipid panel; Future  Hypertriglyceridemia- her triglycerides remain slightly elevated but not high enough to warrant treatment, she was advised to improve her lifestyle modifications -     Lipid panel; Future  Undiagnosed cardiac murmurs- she sees cardiology today for follow-up regarding this -     Ambulatory referral to  Cardiology  Decreased cardiac ejection fraction -     Ambulatory referral to Cardiology  Routine general medical examination at a health care facility -     Hepatitis C antibody; Future -     HIV antibody; Future   I have discontinued Ms. Picariello's omeprazole-sodium bicarbonate, Albiglutide, fluconazole, and diltiazem. I am also having her start on Exenatide ER. Additionally, I am having her maintain her aspirin, glucose blood, cetirizine, multivitamin with minerals, Calcium Carbonate-Vit D-Min (CALCIUM 1200 PO), glucose blood, ONETOUCH VERIO IQ SYSTEM, fluticasone, INVOKANA, losartan, simvastatin, labetalol, omeprazole, losartan, cloNIDine, and triamterene-hydrochlorothiazide.  Meds ordered this encounter  Medications  . Exenatide ER (BYDUREON) 2 MG PEN    Sig: Inject 1 Act into the skin once a week.    Dispense:  4 each    Refill:  11     Follow-up: Return in about 6 months (around 11/22/2015).  Scarlette Calico, MD

## 2015-05-25 NOTE — Patient Instructions (Signed)

## 2015-05-26 ENCOUNTER — Encounter: Payer: Self-pay | Admitting: Internal Medicine

## 2015-05-26 LAB — HEPATITIS C ANTIBODY: HCV AB: NEGATIVE

## 2015-05-26 LAB — HIV ANTIBODY (ROUTINE TESTING W REFLEX): HIV: NONREACTIVE

## 2015-06-07 NOTE — Telephone Encounter (Signed)
Called pt to inform that Dr. Damita Dunnings has declined the transfer and states that there may be other providers with more availability that she could transfer to.  Left message on phone to call with any questions.   I'm currently struggling to accommodate my current patients and their family members. My next new appointment is not anytime close. I would ask that she see if anyone else was able to take her on.   Brigitte Pulse       Previous Messages     ----- Message -----   From: Virgina Organ   Sent: 06/06/2015  3:50 PM    To: [name], MD, Tonia Ghent, MD  Subject: Patient Requesting Transfer            Good afternoon [name] and Dr. Damita Dunnings,   Carolyn [name] is requesting a transfer to Dr. Leonides Cave. Would this be OK with both of you

## 2015-07-13 ENCOUNTER — Other Ambulatory Visit: Payer: Self-pay | Admitting: Internal Medicine

## 2015-07-19 ENCOUNTER — Telehealth: Payer: Self-pay | Admitting: Internal Medicine

## 2015-07-19 MED ORDER — TRIAMTERENE-HCTZ 37.5-25 MG PO CAPS: 1.0000 | ORAL_CAPSULE | Freq: Every day | ORAL | Status: DC

## 2015-07-19 NOTE — Telephone Encounter (Signed)
Done

## 2015-07-19 NOTE — Telephone Encounter (Signed)
Patient is requesting triamterene hydrochlorothiazide to be sent to [facility] at [address]

## 2015-08-12 ENCOUNTER — Other Ambulatory Visit: Payer: Self-pay | Admitting: Cardiovascular Disease

## 2015-08-21 ENCOUNTER — Ambulatory Visit (HOSPITAL_COMMUNITY): Payer: 59 | Attending: Cardiovascular Disease

## 2015-08-21 ENCOUNTER — Other Ambulatory Visit: Payer: Self-pay

## 2015-08-21 DIAGNOSIS — I429 Cardiomyopathy, unspecified: Secondary | ICD-10-CM

## 2015-08-21 DIAGNOSIS — E119 Type 2 diabetes mellitus without complications: Secondary | ICD-10-CM | POA: Diagnosis not present

## 2015-08-21 DIAGNOSIS — E785 Hyperlipidemia, unspecified: Secondary | ICD-10-CM | POA: Insufficient documentation

## 2015-08-21 DIAGNOSIS — I10 Essential (primary) hypertension: Secondary | ICD-10-CM

## 2015-08-21 DIAGNOSIS — R011 Cardiac murmur, unspecified: Secondary | ICD-10-CM | POA: Insufficient documentation

## 2015-08-21 DIAGNOSIS — I119 Hypertensive heart disease without heart failure: Secondary | ICD-10-CM | POA: Diagnosis not present

## 2015-08-21 DIAGNOSIS — I351 Nonrheumatic aortic (valve) insufficiency: Secondary | ICD-10-CM | POA: Diagnosis not present

## 2015-08-22 ENCOUNTER — Other Ambulatory Visit: Payer: Self-pay

## 2015-08-22 MED ORDER — LABETALOL HCL 300 MG PO TABS: 300.0000 mg | ORAL_TABLET | Freq: Two times a day (BID) | ORAL | Status: DC

## 2015-09-08 ENCOUNTER — Telehealth: Payer: Self-pay

## 2015-09-08 MED ORDER — CANAGLIFLOZIN 300 MG PO TABS: 300.0000 mg | ORAL_TABLET | Freq: Every day | ORAL | Status: DC

## 2015-09-08 NOTE — Telephone Encounter (Signed)
Lm on triage for refill of invokana. erx sent to local [facility].

## 2015-09-25 ENCOUNTER — Telehealth: Payer: Self-pay

## 2015-09-25 NOTE — Telephone Encounter (Signed)
FMLA paperwork received, completed, and placed on MD's desk for signature 

## 2015-09-27 NOTE — Telephone Encounter (Signed)
FMLA completed, faxed, copy sent to scan. Original mailed to pt per pt request, pt advised of same

## 2015-10-05 ENCOUNTER — Telehealth: Payer: Self-pay | Admitting: *Deleted

## 2015-10-05 DIAGNOSIS — E118 Type 2 diabetes mellitus with unspecified complications: Secondary | ICD-10-CM

## 2015-10-05 MED ORDER — EXENATIDE ER 2 MG ~~LOC~~ PEN: 1.0000 | PEN_INJECTOR | SUBCUTANEOUS | Status: DC

## 2015-10-05 NOTE — Telephone Encounter (Signed)
Left msg on triage stating needing to get samples on her insulin if possible. Its been 2 weeks since she order med from [facility] still not received. Only have enough for tomorrow...[name]

## 2015-10-05 NOTE — Telephone Encounter (Signed)
Called pt back no answer LMOM we do not have any samples, but can send rx to local pharmacy "[facility]"...Carolyn [name]

## 2015-10-19 ENCOUNTER — Encounter: Payer: Self-pay | Admitting: Internal Medicine

## 2015-10-19 ENCOUNTER — Other Ambulatory Visit (INDEPENDENT_AMBULATORY_CARE_PROVIDER_SITE_OTHER): Payer: 59

## 2015-10-19 ENCOUNTER — Ambulatory Visit (INDEPENDENT_AMBULATORY_CARE_PROVIDER_SITE_OTHER): Payer: 59 | Admitting: Internal Medicine

## 2015-10-19 VITALS — BP 128/86 | HR 93 | Temp 98.1°F | Resp 16 | Ht 65.0 in | Wt 185.0 lb

## 2015-10-19 DIAGNOSIS — E781 Pure hyperglyceridemia: Secondary | ICD-10-CM | POA: Diagnosis not present

## 2015-10-19 DIAGNOSIS — R0683 Snoring: Secondary | ICD-10-CM

## 2015-10-19 DIAGNOSIS — E118 Type 2 diabetes mellitus with unspecified complications: Secondary | ICD-10-CM

## 2015-10-19 DIAGNOSIS — F410 Panic disorder [episodic paroxysmal anxiety] without agoraphobia: Secondary | ICD-10-CM

## 2015-10-19 DIAGNOSIS — I10 Essential (primary) hypertension: Secondary | ICD-10-CM | POA: Diagnosis not present

## 2015-10-19 DIAGNOSIS — K5901 Slow transit constipation: Secondary | ICD-10-CM

## 2015-10-19 LAB — TRIGLYCERIDES: Triglycerides: 482 mg/dL — ABNORMAL HIGH (ref 0.0–149.0)

## 2015-10-19 LAB — COMPREHENSIVE METABOLIC PANEL
ALBUMIN: 4.9 g/dL (ref 3.5–5.2)
ALT: 21 U/L (ref 0–35)
AST: 14 U/L (ref 0–37)
Alkaline Phosphatase: 74 U/L (ref 39–117)
BILIRUBIN TOTAL: 0.4 mg/dL (ref 0.2–1.2)
BUN: 28 mg/dL — ABNORMAL HIGH (ref 6–23)
CALCIUM: 10.5 mg/dL (ref 8.4–10.5)
CHLORIDE: 101 meq/L (ref 96–112)
CO2: 26 meq/L (ref 19–32)
CREATININE: 1.09 mg/dL (ref 0.40–1.20)
GFR: 67.36 mL/min (ref >60.00)
Glucose, Bld: 131 mg/dL — ABNORMAL HIGH (ref 70–99)
Potassium: 4 mEq/L (ref 3.5–5.1)
Sodium: 136 mEq/L (ref 135–145)
Total Protein: 8.4 g/dL — ABNORMAL HIGH (ref 6.0–8.3)

## 2015-10-19 LAB — HEMOGLOBIN A1C: HEMOGLOBIN A1C: 6.3 % (ref 4.6–6.5)

## 2015-10-19 LAB — MAGNESIUM: MAGNESIUM: 2 mg/dL (ref 1.5–2.5)

## 2015-10-19 MED ORDER — TRAZODONE HCL 150 MG PO TABS
150.0000 mg | ORAL_TABLET | Freq: Every day | ORAL | Status: DC
Start: 2015-10-19 — End: 2015-12-12

## 2015-10-19 MED ORDER — ICOSAPENT ETHYL 1 G PO CAPS: 2.0000 | ORAL_CAPSULE | Freq: Two times a day (BID) | ORAL | Status: DC

## 2015-10-19 MED ORDER — LINACLOTIDE 145 MCG PO CAPS: 145.0000 ug | ORAL_CAPSULE | Freq: Every day | ORAL | Status: DC

## 2015-10-19 NOTE — Progress Notes (Signed)
Pre visit review using our clinic review tool, if applicable. No additional management support is needed unless otherwise documented below in the visit note. 

## 2015-10-19 NOTE — Patient Instructions (Signed)

## 2015-10-19 NOTE — Progress Notes (Signed)
Subjective:  Patient ID: Carolyn [name], female    DOB: [birth year]  Age: 55 y.o. MRN: [id number]  CC: Annual Exam; Hyperlipidemia; Hypertension; and Diabetes   HPI Carolyn [name] presents for a CPX. She saw her gynecologist within the last year so she defers on breast exam and Pap smear today.  She complains over the last few months of waking up at night with a sensation of panic and elevated heart rate. She has both difficulty falling asleep and difficulty staying asleep. She has a history of snoring and tells me that she previously had a sleep study done and it was found that the snoring was not a significant indicator of something like sleep apnea. She denies being under any unusual emotional stressors recently.  She tells me that her blood sugar has been well controlled recently. She has been able to lose weight with diet and exercise. She denies visual disturbance or polyuria/polydipsia/or polyphagia.  She also complains of rather severe constipation and tells me she has tried over-the-counter remedies such as MiraLAX, Colace, and MOM.  Outpatient Prescriptions Prior to Visit  Medication Sig Dispense Refill  . aspirin EC 81 MG EC tablet Take 1 tablet (81 mg total) by mouth daily.    . Blood Glucose Monitoring Suppl (ONETOUCH VERIO IQ SYSTEM) W/DEVICE KIT 1 Act by Does not apply route 3 (three) times daily. 2 kit 0  . Calcium Carbonate-Vit D-Min (CALCIUM 1200 PO) Take 1 tablet by mouth daily.    . canagliflozin (INVOKANA) 300 MG TABS tablet Take 1 tablet (300 mg total) by mouth daily. 90 tablet 1  . cetirizine (ZYRTEC) 10 MG chewable tablet Chew 10 mg by mouth daily.    . cloNIDine (CATAPRES) 0.1 MG tablet Take 2 tablets (0.2 mg total) by mouth daily. 60 tablet 5  . Exenatide ER (BYDUREON) 2 MG PEN Inject 1 Act into the skin once a week. 4 each 5  . fluticasone (FLONASE) 50 MCG/ACT nasal spray Place 2 sprays into both nostrils daily. 48 g 3  . glucose blood (ONE TOUCH ULTRA TEST)  test strip Use to test blood sugar three times a week and prn if having symptoms of low blood sugar Dx: 250.92 90 day supply 100 each 11  . glucose blood (ONETOUCH VERIO) test strip Use TID 100 each 12  . labetalol (NORMODYNE) 300 MG tablet Take 1 tablet (300 mg total) by mouth 2 (two) times daily. 180 tablet 2  . losartan (COZAAR) 100 MG tablet Take 1 tablet by mouth  daily 90 tablet 1  . Multiple Vitamins-Minerals (MULTIVITAMIN WITH MINERALS) tablet Take 1 tablet by mouth daily.    Marland Kitchen omeprazole (PRILOSEC) 40 MG capsule Take 1 capsule by mouth  twice daily at 8 am and 10  pm 180 capsule 0  . simvastatin (ZOCOR) 40 MG tablet Take 1 tablet (40 mg total) by mouth at bedtime. 90 tablet 3  . triamterene-hydrochlorothiazide (DYAZIDE) 37.5-25 MG capsule Take 1 each (1 capsule total) by mouth daily. 90 capsule 1  . losartan (COZAAR) 100 MG tablet Take 1 tablet (100 mg total) by mouth daily. 90 tablet 3  . losartan (COZAAR) 100 MG tablet Take 1 tablet by mouth  daily 90 tablet 0   No facility-administered medications prior to visit.    ROS Review of Systems  Constitutional: Negative.  Negative for fever, chills, diaphoresis, activity change, appetite change and fatigue.  HENT: Negative.  Negative for trouble swallowing.   Eyes: Negative.  Negative for visual  disturbance.  Respiratory: Negative.  Negative for cough, choking, chest tightness, shortness of breath and stridor.   Cardiovascular: Positive for palpitations. Negative for chest pain and leg swelling.  Gastrointestinal: Positive for constipation. Negative for nausea, vomiting, abdominal pain, diarrhea and abdominal distention.  Endocrine: Negative.   Genitourinary: Negative.   Musculoskeletal: Negative.  Negative for myalgias, back pain, arthralgias and neck pain.  Skin: Negative.  Negative for color change and rash.  Allergic/Immunologic: Negative.   Neurological: Negative.  Negative for dizziness, tremors, syncope, speech difficulty,  light-headedness, numbness and headaches.  Hematological: Negative.  Negative for adenopathy. Does not bruise/bleed easily.  Psychiatric/Behavioral: Positive for sleep disturbance. The patient is nervous/anxious.     Objective:  BP 128/86 mmHg  Pulse 93  Temp(Src) 98.1 F (36.7 C) (Oral)  Resp 16  Ht '5\' 5"'  (1.651 m)  Wt 185 lb (83.915 kg)  BMI 30.79 kg/m2  SpO2 96%  BP Readings from Last 3 Encounters:  10/19/15 128/86  05/25/15 130/90  05/25/15 118/82    Wt Readings from Last 3 Encounters:  10/19/15 185 lb (83.915 kg)  05/25/15 193 lb 12.8 oz (87.907 kg)  05/25/15 192 lb (87.091 kg)    Physical Exam  Constitutional: She is oriented to person, place, and time. No distress.  HENT:  Mouth/Throat: Oropharynx is clear and moist. No oropharyngeal exudate.  Eyes: Conjunctivae are normal. Right eye exhibits no discharge. Left eye exhibits no discharge. No scleral icterus.  Neck: Normal range of motion. Neck supple. No JVD present. No tracheal deviation present. No thyromegaly present.  Cardiovascular: Normal rate, regular rhythm, normal heart sounds and intact distal pulses.  Exam reveals no gallop and no friction rub.   No murmur heard. Pulmonary/Chest: Effort normal and breath sounds normal. No stridor. No respiratory distress. She has no wheezes. She has no rales. She exhibits no tenderness.  Abdominal: Soft. Bowel sounds are normal. She exhibits no distension and no mass. There is no tenderness. There is no rebound and no guarding.  Musculoskeletal: Normal range of motion. She exhibits no edema or tenderness.  Lymphadenopathy:    She has no cervical adenopathy.  Neurological: She is oriented to person, place, and time.  Skin: Skin is warm and dry. No rash noted. She is not diaphoretic. No erythema. No pallor.  Vitals reviewed.   Lab Results  Component Value Date   WBC 9.1 05/25/2015   HGB 12.3 05/25/2015   HCT 36.8 05/25/2015   PLT 348.0 05/25/2015   GLUCOSE 131*  10/19/2015   CHOL 192 05/25/2015   TRIG 482.0* 10/19/2015   HDL 38.60* 05/25/2015   LDLDIRECT 87.0 05/25/2015   LDLCALC 68 07/30/2013   ALT 21 10/19/2015   AST 14 10/19/2015   NA 136 10/19/2015   K 4.0 10/19/2015   CL 101 10/19/2015   CREATININE 1.09 10/19/2015   BUN 28* 10/19/2015   CO2 26 10/19/2015   TSH 2.40 10/27/2014   INR 1.04 01/14/2013   HGBA1C 6.3 10/19/2015   MICROALBUR 34.8* 10/27/2014    Nm Gastric Emptying  04/14/2014  CLINICAL DATA:  Six month history of nausea and vomiting EXAM: NUCLEAR MEDICINE GASTRIC EMPTYING SCAN Views:  LAO stomach Radionuclide:  Technetium 3msulfur colloid in egg Dose:  2.2 mCi Route of administration: Oral COMPARISON:  None. FINDINGS: Images and count statistics were obtained serially over a 2 hr time span. There is essentially no emptying of solid material from the stomach after 2 hr. This finding is markedly abnormal. IMPRESSION: Essentially no emptying  of solid material from the stomach after 2 hr. This finding is distinctly abnormal and raises concern for either gastric paresis or gastric outlet obstruction to solid material. Electronically Signed   By: Lowella Grip M.D.   On: 04/14/2014 10:47    Assessment & Plan:   Liridona was seen today for annual exam, hyperlipidemia, hypertension and diabetes.  Diagnoses and all orders for this visit:  Essential hypertension- her blood pressure is well controlled, electrolytes and renal function are stable. -     Cancel: Basic metabolic panel; Future -     Comprehensive metabolic panel; Future  Type 2 diabetes mellitus with complication, without long-term current use of insulin ([place])- her A1c is down to 6.3%, her blood sugars are very well controlled, she was praised for her lifestyle modifications which has resulted in weight loss -     Cancel: Basic metabolic panel; Future -     Hemoglobin A1c; Future -     Comprehensive metabolic panel; Future  Panic disorder without agoraphobia with  moderate panic attacks- will start trazodone at bedtime -     traZODone (DESYREL) 150 MG tablet; Take 1 tablet (150 mg total) by mouth at bedtime.  Snoring -     Cancel: Ambulatory referral to Pulmonology  Hypertriglyceridemia- her triglycerides are up to nearly 500, will start an omega-3 fish oil supplement to treat this. -     Triglycerides; Future -     Icosapent Ethyl (VASCEPA) 1 g CAPS; Take 2 capsules by mouth 2 (two) times daily.  Slow transit constipation- she is on no medications that are associated with constipation and her labs are negative for any secondary causes, will treat this with Linzess. -     linaclotide (LINZESS) 145 MCG CAPS capsule; Take 1 capsule (145 mcg total) by mouth daily before breakfast. -     Magnesium; Future   I am having Ms. Amis start on traZODone, linaclotide, and Icosapent Ethyl. I am also having her maintain her aspirin, glucose blood, cetirizine, multivitamin with minerals, Calcium Carbonate-Vit D-Min (CALCIUM 1200 PO), glucose blood, ONETOUCH VERIO IQ SYSTEM, fluticasone, simvastatin, omeprazole, cloNIDine, losartan, triamterene-hydrochlorothiazide, labetalol, canagliflozin, and Exenatide ER.  Meds ordered this encounter  Medications  . traZODone (DESYREL) 150 MG tablet    Sig: Take 1 tablet (150 mg total) by mouth at bedtime.    Dispense:  90 tablet    Refill:  3  . linaclotide (LINZESS) 145 MCG CAPS capsule    Sig: Take 1 capsule (145 mcg total) by mouth daily before breakfast.    Dispense:  90 capsule    Refill:  3  . Icosapent Ethyl (VASCEPA) 1 g CAPS    Sig: Take 2 capsules by mouth 2 (two) times daily.    Dispense:  120 capsule    Refill:  11     Follow-up: Return in about 6 months (around 04/20/2016).  Scarlette Calico, MD

## 2015-10-20 ENCOUNTER — Other Ambulatory Visit: Payer: Self-pay | Admitting: Internal Medicine

## 2015-10-25 ENCOUNTER — Other Ambulatory Visit: Payer: Self-pay | Admitting: *Deleted

## 2015-10-25 DIAGNOSIS — J301 Allergic rhinitis due to pollen: Secondary | ICD-10-CM

## 2015-10-25 MED ORDER — FLUTICASONE PROPIONATE 50 MCG/ACT NA SUSP
2.0000 | Freq: Every day | NASAL | Status: DC
Start: 2015-10-25 — End: 2016-05-20

## 2015-10-28 ENCOUNTER — Other Ambulatory Visit: Payer: Self-pay | Admitting: Internal Medicine

## 2015-10-31 ENCOUNTER — Telehealth: Payer: Self-pay

## 2015-10-31 NOTE — Telephone Encounter (Signed)
PA initiated and APPROVED via CoverMyMeds key [id number]

## 2015-11-13 ENCOUNTER — Telehealth: Payer: Self-pay

## 2015-11-13 NOTE — Telephone Encounter (Signed)
PA initiated via CoverMyMeds key [place]

## 2015-11-21 ENCOUNTER — Other Ambulatory Visit: Payer: Self-pay | Admitting: Obstetrics and Gynecology

## 2015-11-21 DIAGNOSIS — Z1231 Encounter for screening mammogram for malignant neoplasm of breast: Secondary | ICD-10-CM

## 2015-11-24 NOTE — Telephone Encounter (Signed)
APPROVED through 05/15/2016, pharmacy advised via CoverMyMeds

## 2015-11-29 ENCOUNTER — Encounter: Payer: Self-pay | Admitting: Adult Health

## 2015-11-29 ENCOUNTER — Ambulatory Visit (INDEPENDENT_AMBULATORY_CARE_PROVIDER_SITE_OTHER): Payer: 59 | Admitting: Adult Health

## 2015-11-29 VITALS — BP 138/100 | Temp 98.8°F | Ht 65.0 in | Wt 185.8 lb

## 2015-11-29 DIAGNOSIS — R519 Headache, unspecified: Secondary | ICD-10-CM

## 2015-11-29 DIAGNOSIS — R51 Headache: Secondary | ICD-10-CM | POA: Diagnosis not present

## 2015-11-29 NOTE — Progress Notes (Signed)
Pre visit review using our clinic review tool, if applicable. No additional management support is needed unless otherwise documented below in the visit note. 

## 2015-11-29 NOTE — Patient Instructions (Signed)
It was great meeting you today   I believe that your headache is coming from the sinus pressure you are experiencing, most likely from your allergies. Start using flonase and claritin or zyrtec. Make sure you are staying hydrated and then eating healthy foods.   Follow up if no improvement
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
## 2015-11-30 ENCOUNTER — Ambulatory Visit
Admission: RE | Admit: 2015-11-30 | Discharge: 2015-11-30 | Disposition: A | Discharge status: Home / Self Care | Payer: 59 | Source: Ambulatory Visit | Attending: Obstetrics and Gynecology | Admitting: Obstetrics and Gynecology

## 2015-11-30 ENCOUNTER — Other Ambulatory Visit: Payer: Self-pay | Admitting: *Deleted

## 2015-11-30 DIAGNOSIS — Z1231 Encounter for screening mammogram for malignant neoplasm of breast: Secondary | ICD-10-CM

## 2015-11-30 LAB — HM MAMMOGRAPHY

## 2015-11-30 MED ORDER — LOSARTAN POTASSIUM 100 MG PO TABS: 100.0000 mg | ORAL_TABLET | Freq: Every day | ORAL | 1 refills | Status: DC

## 2015-11-30 NOTE — Telephone Encounter (Signed)
Left msg on triage stating needing rx for Losartan called into [facility]. Also want to know if MD had sample of Bydureon...Carolyn [name]

## 2015-12-01 NOTE — Telephone Encounter (Signed)
Refill was sent yesterday we did not have any samples...Carolyn [name]

## 2015-12-11 ENCOUNTER — Telehealth: Payer: Self-pay | Admitting: Internal Medicine

## 2015-12-12 ENCOUNTER — Encounter: Payer: Self-pay | Admitting: Internal Medicine

## 2015-12-12 ENCOUNTER — Other Ambulatory Visit (INDEPENDENT_AMBULATORY_CARE_PROVIDER_SITE_OTHER): Payer: 59

## 2015-12-12 ENCOUNTER — Ambulatory Visit (INDEPENDENT_AMBULATORY_CARE_PROVIDER_SITE_OTHER): Payer: 59 | Admitting: Internal Medicine

## 2015-12-12 VITALS — BP 142/86 | HR 90 | Temp 98.6°F | Ht 65.0 in | Wt 183.2 lb

## 2015-12-12 DIAGNOSIS — E118 Type 2 diabetes mellitus with unspecified complications: Secondary | ICD-10-CM

## 2015-12-12 DIAGNOSIS — F341 Dysthymic disorder: Secondary | ICD-10-CM | POA: Diagnosis not present

## 2015-12-12 DIAGNOSIS — F418 Other specified anxiety disorders: Secondary | ICD-10-CM | POA: Insufficient documentation

## 2015-12-12 DIAGNOSIS — E781 Pure hyperglyceridemia: Secondary | ICD-10-CM

## 2015-12-12 DIAGNOSIS — I10 Essential (primary) hypertension: Secondary | ICD-10-CM

## 2015-12-12 DIAGNOSIS — F5105 Insomnia due to other mental disorder: Secondary | ICD-10-CM

## 2015-12-12 LAB — URINALYSIS, ROUTINE W REFLEX MICROSCOPIC
BILIRUBIN URINE: NEGATIVE
KETONES UR: NEGATIVE
LEUKOCYTES UA: NEGATIVE
Nitrite: NEGATIVE
Specific Gravity, Urine: 1.025 (ref 1.000–1.030)
Total Protein, Urine: 30 — AB
UROBILINOGEN UA: 0.2 (ref 0.0–1.0)
Urine Glucose: >=1000 — AB
pH: 5.5 (ref 5.0–8.0)

## 2015-12-12 LAB — HEMOGLOBIN A1C: Hgb A1c MFr Bld: 6.1 % (ref 4.6–6.5)

## 2015-12-12 LAB — MICROALBUMIN / CREATININE URINE RATIO
Creatinine,U: 163 mg/dL
MICROALB UR: 20.3 mg/dL — AB (ref 0.0–1.9)
Microalb Creat Ratio: 12.5 mg/g (ref 0.0–30.0)

## 2015-12-12 LAB — BASIC METABOLIC PANEL
BUN: 25 mg/dL — AB (ref 6–23)
CALCIUM: 9.8 mg/dL (ref 8.4–10.5)
CO2: 29 mEq/L (ref 19–32)
Chloride: 101 mEq/L (ref 96–112)
Creatinine, Ser: 1.19 mg/dL (ref 0.40–1.20)
GFR: 60.83 mL/min (ref >60.00)
GLUCOSE: 104 mg/dL — AB (ref 70–99)
Potassium: 3.7 mEq/L (ref 3.5–5.1)
SODIUM: 137 meq/L (ref 135–145)

## 2015-12-12 LAB — TRIGLYCERIDES: Triglycerides: 389 mg/dL — ABNORMAL HIGH (ref 0.0–149.0)

## 2015-12-12 MED ORDER — SUVOREXANT 15 MG PO TABS: 1.0000 | ORAL_TABLET | Freq: Every evening | ORAL | 5 refills | Status: DC | PRN

## 2015-12-12 NOTE — Progress Notes (Signed)
Pre visit review using our clinic review tool, if applicable. No additional management support is needed unless otherwise documented below in the visit note. 

## 2015-12-12 NOTE — Patient Instructions (Signed)
Insomnia Insomnia is a sleep disorder that makes it difficult to fall asleep or to stay asleep. Insomnia can cause tiredness (fatigue), low energy, difficulty concentrating, mood swings, and poor performance at work or school.  There are three different ways to classify insomnia:  Difficulty falling asleep.  Difficulty staying asleep.  Waking up too early in the morning. Any type of insomnia can be long-term (chronic) or short-term (acute). Both are common. Short-term insomnia usually lasts for three months or less. Chronic insomnia occurs at least three times a week for longer than three months. CAUSES  Insomnia may be caused by another condition, situation, or substance, such as:  Anxiety.  Certain medicines.  Gastroesophageal reflux disease (GERD) or other gastrointestinal conditions.  Asthma or other breathing conditions.  Restless legs syndrome, sleep apnea, or other sleep disorders.  Chronic pain.  Menopause. This may include hot flashes.  Stroke.  Abuse of alcohol, tobacco, or illegal drugs.  Depression.  Caffeine.   Neurological disorders, such as Alzheimer disease.  An overactive thyroid (hyperthyroidism). The cause of insomnia may not be known. RISK FACTORS Risk factors for insomnia include:  Gender. Women are more commonly affected than men.  Age. Insomnia is more common as you get older.  Stress. This may involve your professional or personal life.  Income. Insomnia is more common in people with lower income.  Lack of exercise.   Irregular work schedule or night shifts.  Traveling between different time zones. SIGNS AND SYMPTOMS If you have insomnia, trouble falling asleep or trouble staying asleep is the main symptom. This may lead to other symptoms, such as:  Feeling fatigued.  Feeling nervous about going to sleep.  Not feeling rested in the morning.  Having trouble concentrating.  Feeling irritable, anxious, or depressed. TREATMENT   Treatment for insomnia depends on the cause. If your insomnia is caused by an underlying condition, treatment will focus on addressing the condition. Treatment may also include:   Medicines to help you sleep.  Counseling or therapy.  Lifestyle adjustments. HOME CARE INSTRUCTIONS   Take medicines only as directed by your health care provider.  Keep regular sleeping and waking hours. Avoid naps.  Keep a sleep diary to help you and your health care provider figure out what could be causing your insomnia. Include:   When you sleep.  When you wake up during the night.  How well you sleep.   How rested you feel the next day.  Any side effects of medicines you are taking.  What you eat and drink.   Make your bedroom a comfortable place where it is easy to fall asleep:  Put up shades or special blackout curtains to block light from outside.  Use a white noise machine to block noise.  Keep the temperature cool.   Exercise regularly as directed by your health care provider. Avoid exercising right before bedtime.  Use relaxation techniques to manage stress. Ask your health care provider to suggest some techniques that may work well for you. These may include:  Breathing exercises.  Routines to release muscle tension.  Visualizing peaceful scenes.  Cut back on alcohol, caffeinated beverages, and cigarettes, especially close to bedtime. These can disrupt your sleep.  Do not overeat or eat spicy foods right before bedtime. This can lead to digestive discomfort that can make it hard for you to sleep.  Limit screen use before bedtime. This includes:  Watching TV.  Using your smartphone, tablet, and computer.  Stick to a routine. This   can help you fall asleep faster. Try to do a quiet activity, brush your teeth, and go to bed at the same time each night.  Get out of bed if you are still awake after 15 minutes of trying to sleep. Keep the lights down, but try reading or  doing a quiet activity. When you feel sleepy, go back to bed.  Make sure that you drive carefully. Avoid driving if you feel very sleepy.  Keep all follow-up appointments as directed by your health care provider. This is important. SEEK MEDICAL CARE IF:   You are tired throughout the day or have trouble in your daily routine due to sleepiness.  You continue to have sleep problems or your sleep problems get worse. SEEK IMMEDIATE MEDICAL CARE IF:   You have serious thoughts about hurting yourself or someone else.   This information is not intended to replace advice given to you by your health care provider. Make sure you discuss any questions you have with your health care provider.   Document Released: 03/29/2000 Document Revised: 12/21/2014 Document Reviewed: 12/31/2013 Elsevier Interactive Patient Education 2016 Elsevier Inc.  

## 2015-12-12 NOTE — Progress Notes (Signed)
Subjective:  Patient ID: Carolyn [name], female    DOB: 07-17-1960  Age: 55 y.o. MRN: [id number]  CC: Hyperlipidemia; Hypertension; and Diabetes   HPI Carolyn [name] presents for follow-up on several medical problems. She continues to complain of insomnia with frequent awakenings and early morning awakenings. She states she will sleep for about 3-4 hours and then wakes up and can't go back to sleep. She was previously treated with trazodone but she didn't like the way that it made her feel and tells me it was not beneficial. She has been working diligently on her lifestyle modifications and has lost weight since her last visit. She tells me her blood pressure and blood sugar have been well controlled.  Outpatient Medications Prior to Visit  Medication Sig Dispense Refill  . aspirin EC 81 MG EC tablet Take 1 tablet (81 mg total) by mouth daily.    . Blood Glucose Monitoring Suppl (ONETOUCH VERIO IQ SYSTEM) W/DEVICE KIT 1 Act by Does not apply route 3 (three) times daily. 2 kit 0  . Calcium Carbonate-Vit D-Min (CALCIUM 1200 PO) Take 1 tablet by mouth daily.    . canagliflozin (INVOKANA) 300 MG TABS tablet Take 1 tablet (300 mg total) by mouth daily. 90 tablet 1  . cetirizine (ZYRTEC) 10 MG chewable tablet Chew 10 mg by mouth daily.    . cloNIDine (CATAPRES) 0.1 MG tablet Take 2 tablets (0.2 mg total) by mouth daily. 60 tablet 5  . Exenatide ER (BYDUREON) 2 MG PEN Inject 1 Act into the skin once a week. 4 each 5  . fluticasone (FLONASE) 50 MCG/ACT nasal spray Place 2 sprays into both nostrils daily. 48 g 3  . glucose blood (ONE TOUCH ULTRA TEST) test strip Use to test blood sugar three times a week and prn if having symptoms of low blood sugar Dx: 250.92 90 day supply 100 each 11  . glucose blood (ONETOUCH VERIO) test strip Use TID 100 each 12  . Icosapent Ethyl (VASCEPA) 1 g CAPS Take 2 capsules by mouth 2 (two) times daily. 120 capsule 11  . labetalol (NORMODYNE) 300 MG tablet Take 1 tablet  (300 mg total) by mouth 2 (two) times daily. 180 tablet 2  . linaclotide (LINZESS) 145 MCG CAPS capsule Take 1 capsule (145 mcg total) by mouth daily before breakfast. 90 capsule 3  . losartan (COZAAR) 100 MG tablet Take 1 tablet (100 mg total) by mouth daily. 90 tablet 1  . omeprazole (PRILOSEC) 40 MG capsule Take 1 capsule by mouth  twice daily at 8am and 10pm 180 capsule 3  . simvastatin (ZOCOR) 40 MG tablet Take 1 tablet (40 mg total) by mouth at bedtime. 90 tablet 3  . triamterene-hydrochlorothiazide (DYAZIDE) 37.5-25 MG capsule Take 1 each (1 capsule total) by mouth daily. 90 capsule 1  . Multiple Vitamins-Minerals (MULTIVITAMIN WITH MINERALS) tablet Take 1 tablet by mouth daily.    Marland Kitchen TANZEUM 50 MG PEN Inject subcutaneously once  a week. 12 each 3  . traZODone (DESYREL) 150 MG tablet Take 1 tablet (150 mg total) by mouth at bedtime. 90 tablet 3   No facility-administered medications prior to visit.     ROS Review of Systems  Constitutional: Positive for fatigue. Negative for activity change, appetite change and unexpected weight change.  HENT: Negative.  Negative for trouble swallowing.   Eyes: Negative for visual disturbance.  Respiratory: Negative.  Negative for cough, choking, chest tightness, shortness of breath and stridor.   Cardiovascular: Negative.  Negative for chest pain, palpitations and leg swelling.  Gastrointestinal: Negative.  Negative for abdominal pain, constipation, diarrhea, nausea and vomiting.  Endocrine: Negative for polydipsia, polyphagia and polyuria.  Genitourinary: Negative.  Negative for difficulty urinating.  Musculoskeletal: Negative.  Negative for arthralgias, back pain, myalgias and neck pain.  Skin: Negative.  Negative for color change and rash.  Neurological: Negative.  Negative for dizziness and weakness.  Hematological: Negative.  Negative for adenopathy. Does not bruise/bleed easily.  Psychiatric/Behavioral: Positive for sleep disturbance. Negative  for behavioral problems, confusion, decreased concentration, dysphoric mood and suicidal ideas. The patient is not nervous/anxious.     Objective:  BP (!) 142/86 (BP Location: Right Arm, Patient Position: Sitting, Cuff Size: Normal)   Pulse 90   Temp 98.6 F (37 C) (Oral)   Ht '5\' 5"'  (1.651 m)   Wt 183 lb 4 oz (83.1 kg)   SpO2 95%   BMI 30.49 kg/m   BP Readings from Last 3 Encounters:  12/12/15 (!) 142/86  11/29/15 (!) 138/100  10/19/15 128/86    Wt Readings from Last 3 Encounters:  12/12/15 183 lb 4 oz (83.1 kg)  11/29/15 185 lb 12.8 oz (84.3 kg)  10/19/15 185 lb (83.9 kg)    Physical Exam  Constitutional: She is oriented to person, place, and time. No distress.  HENT:  Mouth/Throat: Oropharynx is clear and moist. No oropharyngeal exudate.  Eyes: Conjunctivae are normal. Right eye exhibits no discharge. Left eye exhibits no discharge. No scleral icterus.  Neck: Normal range of motion. Neck supple. No JVD present. No tracheal deviation present. No thyromegaly present.  Cardiovascular: Normal rate, regular rhythm, normal heart sounds and intact distal pulses.  Exam reveals no gallop and no friction rub.   No murmur heard. Pulmonary/Chest: Effort normal and breath sounds normal. No stridor. No respiratory distress. She has no wheezes. She has no rales. She exhibits no tenderness.  Abdominal: Soft. Bowel sounds are normal. She exhibits no distension and no mass. There is no tenderness. There is no rebound and no guarding.  Musculoskeletal: Normal range of motion. She exhibits deformity. She exhibits no edema or tenderness.  Lymphadenopathy:    She has no cervical adenopathy.  Neurological: She is oriented to person, place, and time.  Skin: Skin is warm and dry. No rash noted. She is not diaphoretic. No erythema. No pallor.  Vitals reviewed.   Lab Results  Component Value Date   WBC 9.1 05/25/2015   HGB 12.3 05/25/2015   HCT 36.8 05/25/2015   PLT 348.0 05/25/2015    GLUCOSE 104 (H) 12/12/2015   CHOL 192 05/25/2015   TRIG 389.0 (H) 12/12/2015   HDL 38.60 (L) 05/25/2015   LDLDIRECT 87.0 05/25/2015   LDLCALC 68 07/30/2013   ALT 21 10/19/2015   AST 14 10/19/2015   NA 137 12/12/2015   K 3.7 12/12/2015   CL 101 12/12/2015   CREATININE 1.19 12/12/2015   BUN 25 (H) 12/12/2015   CO2 29 12/12/2015   TSH 2.40 10/27/2014   INR 1.04 01/14/2013   HGBA1C 6.1 12/12/2015   MICROALBUR 20.3 (H) 12/12/2015    Mm Digital Screening Bilateral  Result Date: 11/30/2015 CLINICAL DATA:  Screening. EXAM: DIGITAL SCREENING BILATERAL MAMMOGRAM WITH CAD COMPARISON:  Previous exam(s). ACR Breast Density Category b: There are scattered areas of fibroglandular density. FINDINGS: There are no findings suspicious for malignancy. Images were processed with CAD. IMPRESSION: No mammographic evidence of malignancy. A result letter of this screening mammogram will be mailed directly to  the patient. RECOMMENDATION: Screening mammogram in one year. (Code:SM-B-01Y) BI-RADS CATEGORY  1: Negative. Electronically Signed   By: Fidela Salisbury M.D.   On: 11/30/2015 12:54    Assessment & Plan:   Camesha was seen today for hyperlipidemia, hypertension and diabetes.  Diagnoses and all orders for this visit:  Essential hypertension- her blood pressures adequately well-controlled, electrolytes and renal function are stable. -     Basic metabolic panel; Future -     Urinalysis, Routine w reflex microscopic (not at [facility]); Future  Type 2 diabetes mellitus with complication, without long-term current use of insulin ([place])- her blood sugars are adequately well-controlled -     Basic metabolic panel; Future -     Hemoglobin A1c; Future -     Microalbumin / creatinine urine ratio; Future  Hypertriglyceridemia- her triglycerides are mildly elevated at 389, this does not need to be treated with the medication but she does agreed to continue to work on her lifestyle modifications and will reduce her  intake of carbohydrates and fat -     Triglycerides; Future  Insomnia secondary to depression with anxiety- she will try Belsomra to treat this -     Suvorexant (BELSOMRA) 15 MG TABS; Take 1 tablet by mouth at bedtime as needed.   I have discontinued Ms. Gatley's multivitamin with minerals, traZODone, and TANZEUM. I am also having her start on Suvorexant. Additionally, I am having her maintain her aspirin, glucose blood, cetirizine, Calcium Carbonate-Vit D-Min (CALCIUM 1200 PO), glucose blood, ONETOUCH VERIO IQ SYSTEM, simvastatin, cloNIDine, triamterene-hydrochlorothiazide, labetalol, canagliflozin, Exenatide ER, linaclotide, Icosapent Ethyl, omeprazole, fluticasone, and losartan.  Meds ordered this encounter  Medications  . Suvorexant (BELSOMRA) 15 MG TABS    Sig: Take 1 tablet by mouth at bedtime as needed.    Dispense:  30 tablet    Refill:  5     Follow-up: Return in about 4 months (around 04/12/2016).  Scarlette Calico, MD

## 2015-12-20 ENCOUNTER — Telehealth: Payer: Self-pay

## 2015-12-20 NOTE — Telephone Encounter (Signed)
Pt delivered FMLA paperwork to be filled out. The previous one did not have enough time for appt and so that [facility] request was terminated.   FMLA form filled out and given to PCP to sign.

## 2015-12-25 NOTE — Telephone Encounter (Signed)
Per Dr Ronnald Ramp' assistant, forms were faxed 12/22/2015. Pt advised of same and originals placed in cabinet for pt pickup.  Copy sent to scan

## 2015-12-29 ENCOUNTER — Other Ambulatory Visit: Payer: Self-pay | Admitting: Internal Medicine

## 2015-12-29 ENCOUNTER — Telehealth: Payer: Self-pay | Admitting: Internal Medicine

## 2015-12-29 MED ORDER — CLONIDINE HCL 0.1 MG PO TABS: 0.2000 mg | ORAL_TABLET | Freq: Every day | ORAL | 0 refills | Status: DC

## 2015-12-29 MED ORDER — CLONIDINE HCL 0.1 MG PO TABS: 0.2000 mg | ORAL_TABLET | Freq: Every day | ORAL | 1 refills | Status: DC

## 2015-12-29 NOTE — Telephone Encounter (Signed)
Pt request refill for cloNIDine (CATAPRES) 0.1 MG tablet send to [facility]. Pt was wondering if we can do it today because she completley out of this med. Please help

## 2015-12-29 NOTE — Telephone Encounter (Signed)
erx sent to local and to mail order

## 2016-01-09 ENCOUNTER — Encounter: Payer: Self-pay | Admitting: Internal Medicine

## 2016-01-09 ENCOUNTER — Other Ambulatory Visit: Payer: Self-pay | Admitting: Internal Medicine

## 2016-01-09 DIAGNOSIS — R931 Abnormal findings on diagnostic imaging of heart and coronary circulation: Secondary | ICD-10-CM

## 2016-01-09 DIAGNOSIS — I10 Essential (primary) hypertension: Secondary | ICD-10-CM

## 2016-01-09 MED ORDER — AZILSARTAN MEDOXOMIL 80 MG PO TABS: 1.0000 | ORAL_TABLET | Freq: Every day | ORAL | 1 refills | Status: DC

## 2016-02-05 ENCOUNTER — Other Ambulatory Visit: Payer: Self-pay | Admitting: Internal Medicine

## 2016-02-08 ENCOUNTER — Other Ambulatory Visit (INDEPENDENT_AMBULATORY_CARE_PROVIDER_SITE_OTHER): Payer: 59

## 2016-02-08 ENCOUNTER — Encounter: Payer: Self-pay | Admitting: Nurse Practitioner

## 2016-02-08 ENCOUNTER — Ambulatory Visit (INDEPENDENT_AMBULATORY_CARE_PROVIDER_SITE_OTHER): Payer: 59 | Admitting: Nurse Practitioner

## 2016-02-08 ENCOUNTER — Ambulatory Visit (INDEPENDENT_AMBULATORY_CARE_PROVIDER_SITE_OTHER)
Admission: RE | Admit: 2016-02-08 | Discharge: 2016-02-08 | Disposition: A | Discharge status: Home / Self Care | Payer: 59 | Source: Ambulatory Visit | Attending: Nurse Practitioner | Admitting: Nurse Practitioner

## 2016-02-08 VITALS — BP 126/86 | HR 86 | Temp 97.7°F | Ht 65.0 in | Wt 181.0 lb

## 2016-02-08 DIAGNOSIS — M546 Pain in thoracic spine: Secondary | ICD-10-CM | POA: Diagnosis not present

## 2016-02-08 DIAGNOSIS — R0609 Other forms of dyspnea: Secondary | ICD-10-CM

## 2016-02-08 DIAGNOSIS — R7889 Finding of other specified substances, not normally found in blood: Secondary | ICD-10-CM

## 2016-02-08 LAB — HEPATIC FUNCTION PANEL
ALK PHOS: 65 U/L (ref 39–117)
ALT: 18 U/L (ref 0–35)
AST: 11 U/L (ref 0–37)
Albumin: 5 g/dL (ref 3.5–5.2)
BILIRUBIN DIRECT: 0.1 mg/dL (ref 0.0–0.3)
BILIRUBIN TOTAL: 0.4 mg/dL (ref 0.2–1.2)
Total Protein: 8.6 g/dL — ABNORMAL HIGH (ref 6.0–8.3)

## 2016-02-08 LAB — CBC WITH DIFFERENTIAL/PLATELET
BASOS PCT: 0.5 % (ref 0.0–3.0)
Basophils Absolute: 0 10*3/uL (ref 0.0–0.1)
EOS ABS: 0.1 10*3/uL (ref 0.0–0.7)
EOS PCT: 1.4 % (ref 0.0–5.0)
HEMATOCRIT: 35 % — AB (ref 36.0–46.0)
Hemoglobin: 11.8 g/dL — ABNORMAL LOW (ref 12.0–15.0)
LYMPHS PCT: 28.5 % (ref 12.0–46.0)
Lymphs Abs: 2.6 10*3/uL (ref 0.7–4.0)
MCHC: 33.7 g/dL (ref 30.0–36.0)
MCV: 85.7 fl (ref 78.0–100.0)
Monocytes Absolute: 0.5 10*3/uL (ref 0.1–1.0)
Monocytes Relative: 5.5 % (ref 3.0–12.0)
NEUTROS ABS: 5.9 10*3/uL (ref 1.4–7.7)
Neutrophils Relative %: 64.1 % (ref 43.0–77.0)
PLATELETS: 334 10*3/uL (ref 150.0–400.0)
RBC: 4.08 Mil/uL (ref 3.87–5.11)
RDW: 14 % (ref 11.5–15.5)
WBC: 9.1 10*3/uL (ref 4.0–10.5)

## 2016-02-08 LAB — AMYLASE: Amylase: 81 U/L (ref 27–131)

## 2016-02-08 LAB — D-DIMER, QUANTITATIVE: D-Dimer, Quant: 1.34 mcg/mL FEU — ABNORMAL HIGH (ref <0.50)

## 2016-02-08 LAB — LIPASE: Lipase: 48 U/L (ref 11.0–59.0)

## 2016-02-08 MED ORDER — KETOROLAC TROMETHAMINE 30 MG/ML IJ SOLN
30.0000 mg | Freq: Once | INTRAMUSCULAR | Status: AC
  Administered 2016-02-08: 30 mg via INTRAMUSCULAR

## 2016-02-08 NOTE — Patient Instructions (Addendum)
Patient reports improvement in pain from 8/10 to 5/10 after toradol IM.  Advised to continue alternating Tylenol and ibuprofen as needed for pain. D-dimer is positive, CTA chest to r/o PE ordered. CTA scheduled for today 02/09/16 at 2:45pm. Patient notified. She is to go to hospital if pain worsens.  CTA chest is negative for Pe. Patient notified.

## 2016-02-08 NOTE — Progress Notes (Signed)
Pre visit review using our clinic review tool, if applicable. No additional management support is needed unless otherwise documented below in the visit note. 

## 2016-02-08 NOTE — Progress Notes (Addendum)
Subjective:  Patient ID: Carolyn [name], female    DOB: Nov 08, 1960  Age: 55 y.o. MRN: [id number]  CC: Back Pain (Back discomfort and pain/hard time breathing when walking, weak for 2 days.)   Back Pain  This is a new problem. The current episode started yesterday. The problem occurs constantly. The problem has been rapidly worsening since onset. The pain is present in the thoracic spine. The quality of the pain is described as stabbing. The pain does not radiate. The pain is severe. The pain is the same all the time. The symptoms are aggravated by lying down. Stiffness is present all day. Associated symptoms include chest pain. Pertinent negatives include no abdominal pain, bladder incontinence, bowel incontinence, fever, headaches, numbness, weakness or weight loss. (SOB on exertion. worse pain with respiration.) She has tried NSAIDs and bed rest (and tylenol) for the symptoms. The treatment provided no relief.  Denies any recent travel. Denies any chest or back trauma.  Outpatient Medications Prior to Visit  Medication Sig Dispense Refill  . aspirin EC 81 MG EC tablet Take 1 tablet (81 mg total) by mouth daily.    . Azilsartan Medoxomil (EDARBI) 80 MG TABS Take 1 tablet (80 mg total) by mouth daily. 90 tablet 1  . Blood Glucose Monitoring Suppl (ONETOUCH VERIO IQ SYSTEM) W/DEVICE KIT 1 Act by Does not apply route 3 (three) times daily. 2 kit 0  . Calcium Carbonate-Vit D-Min (CALCIUM 1200 PO) Take 1 tablet by mouth daily.    . canagliflozin (INVOKANA) 300 MG TABS tablet Take 1 tablet (300 mg total) by mouth daily. 90 tablet 1  . cetirizine (ZYRTEC) 10 MG chewable tablet Chew 10 mg by mouth daily.    . cloNIDine (CATAPRES) 0.1 MG tablet Take 2 tablets (0.2 mg total) by mouth daily. 180 tablet 1  . Exenatide ER (BYDUREON) 2 MG PEN Inject 1 Act into the skin once a week. 4 each 5  . fluticasone (FLONASE) 50 MCG/ACT nasal spray Place 2 sprays into both nostrils daily. 48 g 3  . glucose blood  (ONE TOUCH ULTRA TEST) test strip Use to test blood sugar three times a week and prn if having symptoms of low blood sugar Dx: 250.92 90 day supply 100 each 11  . glucose blood (ONETOUCH VERIO) test strip Use TID 100 each 12  . Icosapent Ethyl (VASCEPA) 1 g CAPS Take 2 capsules by mouth 2 (two) times daily. 120 capsule 11  . labetalol (NORMODYNE) 300 MG tablet Take 1 tablet (300 mg total) by mouth 2 (two) times daily. 180 tablet 2  . linaclotide (LINZESS) 145 MCG CAPS capsule Take 1 capsule (145 mcg total) by mouth daily before breakfast. 90 capsule 3  . omeprazole (PRILOSEC) 40 MG capsule Take 1 capsule by mouth  twice daily at 8am and 10pm 180 capsule 3  . simvastatin (ZOCOR) 40 MG tablet Take 1 tablet (40 mg total) by mouth at bedtime. 90 tablet 3  . Suvorexant (BELSOMRA) 15 MG TABS Take 1 tablet by mouth at bedtime as needed. 30 tablet 5  . triamterene-hydrochlorothiazide (DYAZIDE) 37.5-25 MG capsule TAKE ONE CAPSULE BY MOUTH ONCE DAILY 30 capsule 5   No facility-administered medications prior to visit.     ROS See HPI  Objective:  BP 126/86 (BP Location: Left Arm, Patient Position: Sitting, Cuff Size: Normal)   Pulse 86   Temp 97.7 F (36.5 C)   Ht '5\' 5"'  (1.651 m)   Wt 181 lb (82.1 kg)  SpO2 98%   BMI 30.12 kg/m   BP Readings from Last 3 Encounters:  02/08/16 126/86  12/12/15 (!) 142/86  11/29/15 (!) 138/100    Wt Readings from Last 3 Encounters:  02/08/16 181 lb (82.1 kg)  12/12/15 183 lb 4 oz (83.1 kg)  11/29/15 185 lb 12.8 oz (84.3 kg)    Physical Exam  Constitutional: She is oriented to person, place, and time.  Eyes: No scleral icterus.  Cardiovascular: Normal rate, regular rhythm and normal heart sounds.   Pulmonary/Chest: Effort normal. She has no wheezes. She has no rales. She exhibits tenderness.  Diminished lung sounds in bilateral lower bases  Abdominal: Soft. Bowel sounds are normal. She exhibits no distension. There is no tenderness.  Musculoskeletal:  She exhibits tenderness. She exhibits no edema.       Cervical back: Normal.       Thoracic back: She exhibits tenderness, bony tenderness and pain. She exhibits no swelling, no edema, no spasm and normal pulse.  Neurological: She is alert and oriented to person, place, and time.  Skin: Skin is warm and dry. No rash noted. No erythema.  Vitals reviewed.   Recent Results (from the past 2160 hour(s))  HM MAMMOGRAPHY     Status: None   Collection Time: 11/30/15 12:00 AM  Result Value Ref Range   HM Mammogram 0-4 Bi-Rad 0-4 Bi-Rad, Self Reported Normal  Basic metabolic panel     Status: Abnormal   Collection Time: 12/12/15  4:04 PM  Result Value Ref Range   Sodium 137 135 - 145 mEq/L   Potassium 3.7 3.5 - 5.1 mEq/L   Chloride 101 96 - 112 mEq/L   CO2 29 19 - 32 mEq/L   Glucose, Bld 104 (H) 70 - 99 mg/dL   BUN 25 (H) 6 - 23 mg/dL   Creatinine, Ser 1.19 0.40 - 1.20 mg/dL   Calcium 9.8 8.4 - 10.5 mg/dL   GFR 60.83 >60.00 mL/min  Triglycerides     Status: Abnormal   Collection Time: 12/12/15  4:04 PM  Result Value Ref Range   Triglycerides 389.0 (H) 0.0 - 149.0 mg/dL    Comment: Normal:  <150 mg/dLBorderline High:  150 - 199 mg/dL  Hemoglobin A1c     Status: None   Collection Time: 12/12/15  4:04 PM  Result Value Ref Range   Hgb A1c MFr Bld 6.1 4.6 - 6.5 %    Comment: Glycemic Control Guidelines for People with Diabetes:Non Diabetic:  <6%Goal of Therapy: <7%Additional Action Suggested:  >8%   Microalbumin / creatinine urine ratio     Status: Abnormal   Collection Time: 12/12/15  4:04 PM  Result Value Ref Range   Microalb, Ur 20.3 (H) 0.0 - 1.9 mg/dL   Creatinine,U 163.0 mg/dL   Microalb Creat Ratio 12.5 0.0 - 30.0 mg/g  Urinalysis, Routine w reflex microscopic (not at [facility])     Status: Abnormal   Collection Time: 12/12/15  4:04 PM  Result Value Ref Range   Color, Urine YELLOW Yellow;Lt. Yellow   APPearance CLEAR Clear   Specific Gravity, Urine 1.025 1.000 - 1.030   pH 5.5 5.0 -  8.0   Total Protein, Urine 30 (A) Negative   Urine Glucose >=1000 (A) Negative   Ketones, ur NEGATIVE Negative   Bilirubin Urine NEGATIVE Negative   Hgb urine dipstick TRACE-INTACT (A) Negative   Urobilinogen, UA 0.2 0.0 - 1.0   Leukocytes, UA NEGATIVE Negative   Nitrite NEGATIVE Negative   WBC, UA  0-2/hpf 0-2/hpf   RBC / HPF 0-2/hpf 0-2/hpf   Squamous Epithelial / LPF Rare(0-4/hpf) Rare(0-4/hpf)  CBC w/Diff     Status: Abnormal   Collection Time: 02/08/16 11:56 AM  Result Value Ref Range   WBC 9.1 4.0 - 10.5 K/uL   RBC 4.08 3.87 - 5.11 Mil/uL   Hemoglobin 11.8 (L) 12.0 - 15.0 g/dL   HCT 35.0 (L) 36.0 - 46.0 %   MCV 85.7 78.0 - 100.0 fl   MCHC 33.7 30.0 - 36.0 g/dL   RDW 14.0 11.5 - 15.5 %   Platelets 334.0 150.0 - 400.0 K/uL   Neutrophils Relative % 64.1 43.0 - 77.0 %   Lymphocytes Relative 28.5 12.0 - 46.0 %   Monocytes Relative 5.5 3.0 - 12.0 %   Eosinophils Relative 1.4 0.0 - 5.0 %   Basophils Relative 0.5 0.0 - 3.0 %   Neutro Abs 5.9 1.4 - 7.7 K/uL   Lymphs Abs 2.6 0.7 - 4.0 K/uL   Monocytes Absolute 0.5 0.1 - 1.0 K/uL   Eosinophils Absolute 0.1 0.0 - 0.7 K/uL   Basophils Absolute 0.0 0.0 - 0.1 K/uL  D-Dimer, Quantitative     Status: Abnormal   Collection Time: 02/08/16 11:56 AM  Result Value Ref Range   D-Dimer, Quant 1.34 (H) <0.50 mcg/mL FEU    Comment:   The D-Dimer test is used frequently to exclude an acute PE or DVT.  In patients with a low to moderate clinical risk assessment and a D-Dimer result <0.50 mcg/mL FEU, the likelihood of a PE or DVT is very low.  However, a thromboembolic event should not be excluded solely on the basis of the D-Dimer level.  Increased levels of D-Dimer are associated with a PE, DVT, DIC, malignancies, inflammation, sepsis, surgery, trauma, pregnancy, and advancing patient age. [Jama 2006 11:295(2): 631-497]   For additional information, please refer to: http://education.[facility].com/faq/FAQ149 (This link is being  provided for information/ educational purposes only)     Lipase     Status: None   Collection Time: 02/08/16 11:56 AM  Result Value Ref Range   Lipase 48.0 11.0 - 59.0 U/L  Amylase     Status: None   Collection Time: 02/08/16 11:56 AM  Result Value Ref Range   Amylase 81 27 - 131 U/L  Hepatic function panel     Status: Abnormal   Collection Time: 02/08/16 11:56 AM  Result Value Ref Range   Total Bilirubin 0.4 0.2 - 1.2 mg/dL   Bilirubin, Direct 0.1 0.0 - 0.3 mg/dL   Alkaline Phosphatase 65 39 - 117 U/L   AST 11 0 - 37 U/L   ALT 18 0 - 35 U/L   Total Protein 8.6 (H) 6.0 - 8.3 g/dL   Albumin 5.0 3.5 - 5.2 g/dL  Basic metabolic panel     Status: Abnormal   Collection Time: 02/09/16  1:45 PM  Result Value Ref Range   Sodium 140 135 - 145 mEq/L   Potassium 4.3 3.5 - 5.1 mEq/L   Chloride 103 96 - 112 mEq/L   CO2 24 19 - 32 mEq/L   Glucose, Bld 110 (H) 70 - 99 mg/dL   BUN 32 (H) 6 - 23 mg/dL   Creatinine, Ser 1.11 0.40 - 1.20 mg/dL   Calcium 10.7 (H) 8.4 - 10.5 mg/dL   GFR 65.88 >60.00 mL/min    normal CXR.  ECG: normal sinus rhythm, no change compared to previous tracing 05/2015.  Assessment & Plan:   [place] was  seen today for back pain.  Diagnoses and all orders for this visit:  Acute bilateral thoracic back pain -     EKG 12-Lead -     CBC w/Diff; Future -     DG Chest 2 View; Future -     D-Dimer, Quantitative; Future -     Lipase; Future -     Amylase; Future -     Hepatic function panel; Future -     ketorolac (TORADOL) 30 MG/ML injection 30 mg; Inject 1 mL (30 mg total) into the muscle once. -     Basic metabolic panel; Future -     naproxen (NAPROSYN) 500 MG tablet; Take 1 tablet (500 mg total) by mouth 2 (two) times daily with a meal.  Dyspnea on exertion -     EKG 12-Lead -     CBC w/Diff; Future -     DG Chest 2 View; Future -     D-Dimer, Quantitative; Future -     Lipase; Future -     Amylase; Future -     Hepatic function panel; Future -      Basic metabolic panel; Future  Blood D-dimer assay positive -     Basic metabolic panel; Future   I am having Ms. Frigon start on naproxen. I am also having her maintain her aspirin, glucose blood, cetirizine, Calcium Carbonate-Vit D-Min (CALCIUM 1200 PO), glucose blood, ONETOUCH VERIO IQ SYSTEM, simvastatin, labetalol, canagliflozin, Exenatide ER, linaclotide, Icosapent Ethyl, omeprazole, fluticasone, Suvorexant, cloNIDine, Azilsartan Medoxomil, and triamterene-hydrochlorothiazide. We administered ketorolac.  Meds ordered this encounter  Medications  . ketorolac (TORADOL) 30 MG/ML injection 30 mg  . naproxen (NAPROSYN) 500 MG tablet    Sig: Take 1 tablet (500 mg total) by mouth 2 (two) times daily with a meal.    Dispense:  30 tablet    Refill:  0    Order Specific Question:   Supervising Provider    Answer:   Cassandria Anger [[id number]]   BMP Latest Ref Rng & Units 02/09/2016 12/12/2015 10/19/2015  Glucose 70 - 99 mg/dL 110(H) 104(H) 131(H)  BUN 6 - 23 mg/dL 32(H) 25(H) 28(H)  Creatinine 0.40 - 1.20 mg/dL 1.11 1.19 1.09  Sodium 135 - 145 mEq/L 140 137 136  Potassium 3.5 - 5.1 mEq/L 4.3 3.7 4.0  Chloride 96 - 112 mEq/L 103 101 101  CO2 19 - 32 mEq/L '24 29 26  ' Calcium 8.4 - 10.5 mg/dL 10.7(H) 9.8 10.5   Follow-up: Return if symptoms worsen or fail to improve.  Wilfred Lacy, NP

## 2016-02-09 ENCOUNTER — Ambulatory Visit (INDEPENDENT_AMBULATORY_CARE_PROVIDER_SITE_OTHER)
Admission: RE | Admit: 2016-02-09 | Discharge: 2016-02-09 | Disposition: A | Discharge status: Home / Self Care | Payer: 59 | Source: Ambulatory Visit | Attending: Cardiovascular Disease | Admitting: Cardiovascular Disease

## 2016-02-09 ENCOUNTER — Other Ambulatory Visit: Payer: Self-pay | Admitting: Nurse Practitioner

## 2016-02-09 ENCOUNTER — Other Ambulatory Visit (INDEPENDENT_AMBULATORY_CARE_PROVIDER_SITE_OTHER): Payer: 59

## 2016-02-09 DIAGNOSIS — R7989 Other specified abnormal findings of blood chemistry: Secondary | ICD-10-CM | POA: Diagnosis not present

## 2016-02-09 DIAGNOSIS — R0609 Other forms of dyspnea: Secondary | ICD-10-CM | POA: Diagnosis not present

## 2016-02-09 DIAGNOSIS — M546 Pain in thoracic spine: Secondary | ICD-10-CM

## 2016-02-09 DIAGNOSIS — R7889 Finding of other specified substances, not normally found in blood: Secondary | ICD-10-CM

## 2016-02-09 LAB — BASIC METABOLIC PANEL
BUN: 32 mg/dL — AB (ref 6–23)
CO2: 24 mEq/L (ref 19–32)
Calcium: 10.7 mg/dL — ABNORMAL HIGH (ref 8.4–10.5)
Chloride: 103 mEq/L (ref 96–112)
Creatinine, Ser: 1.11 mg/dL (ref 0.40–1.20)
GFR: 65.88 mL/min (ref >60.00)
Glucose, Bld: 110 mg/dL — ABNORMAL HIGH (ref 70–99)
POTASSIUM: 4.3 meq/L (ref 3.5–5.1)
SODIUM: 140 meq/L (ref 135–145)

## 2016-02-09 MED ORDER — NAPROXEN 500 MG PO TABS: 500.0000 mg | ORAL_TABLET | Freq: Two times a day (BID) | ORAL | 0 refills | Status: DC

## 2016-02-09 MED ORDER — IOPAMIDOL (ISOVUE-370) INJECTION 76%
100.0000 mL | Freq: Once | INTRAVENOUS | Status: AC | PRN
  Administered 2016-02-09: 80 mL via INTRAVENOUS

## 2016-02-09 NOTE — Addendum Note (Signed)
Addended by: Wilfred Lacy L on: 02/09/2016 04:18 PM   Modules accepted: Orders

## 2016-02-20 ENCOUNTER — Encounter: Payer: Self-pay | Admitting: Internal Medicine

## 2016-02-20 MED ORDER — SIMVASTATIN 40 MG PO TABS: 40.0000 mg | ORAL_TABLET | Freq: Every day | ORAL | 0 refills | Status: DC

## 2016-03-06 ENCOUNTER — Other Ambulatory Visit: Payer: Self-pay | Admitting: Internal Medicine

## 2016-03-06 ENCOUNTER — Telehealth: Payer: Self-pay | Admitting: Internal Medicine

## 2016-03-06 MED ORDER — FLUCONAZOLE 150 MG PO TABS: 150.0000 mg | ORAL_TABLET | Freq: Once | ORAL | 3 refills | Status: AC

## 2016-03-06 NOTE — Telephone Encounter (Signed)
Prescription sent to her [place]  If this continues to be a problem then she will have to stop taking [facility]

## 2016-03-06 NOTE — Telephone Encounter (Signed)
Patient states she has gotten a yeast infection from taking invokana.  She is requesting medication to be sent to her pharmacy.  Patient uses [facility] on [address].

## 2016-03-06 NOTE — Telephone Encounter (Signed)
pls advise

## 2016-03-06 NOTE — Telephone Encounter (Signed)
Pt informed rx was sent.  

## 2016-03-16 ENCOUNTER — Other Ambulatory Visit: Payer: Self-pay | Admitting: Internal Medicine

## 2016-03-18 ENCOUNTER — Other Ambulatory Visit: Payer: Self-pay | Admitting: Internal Medicine

## 2016-03-28 ENCOUNTER — Other Ambulatory Visit: Payer: Self-pay | Admitting: *Deleted

## 2016-03-28 MED ORDER — OMEPRAZOLE 40 MG PO CPDR: DELAYED_RELEASE_CAPSULE | ORAL | 2 refills | Status: DC

## 2016-03-28 NOTE — Telephone Encounter (Signed)
Rec'd call pt requesting new rx to be sent to [facility] on her Omeprazole. No longer getting through [facility] anymore. Inform pt sending electronically as we speak....Carolyn [name]

## 2016-04-15 ENCOUNTER — Other Ambulatory Visit: Payer: Self-pay | Admitting: Internal Medicine

## 2016-04-22 ENCOUNTER — Telehealth: Payer: Self-pay

## 2016-04-22 NOTE — Telephone Encounter (Signed)
PA started.  Key: [id number]

## 2016-04-23 NOTE — Telephone Encounter (Signed)
New Key: [id number]

## 2016-04-24 NOTE — Telephone Encounter (Signed)
PA approved. Faxed to pharmacy. Pt informed.

## 2016-05-13 ENCOUNTER — Telehealth: Payer: Self-pay | Admitting: *Deleted

## 2016-05-13 NOTE — Telephone Encounter (Signed)
Rec'd call pt states MD wanted to let him know if she is still getting yeast infections from the [place] med, and pt states she is. aslo want to know if he had samples of linzess...[name]

## 2016-05-14 NOTE — Telephone Encounter (Signed)
Notified pt w/MD response.../lmb 

## 2016-05-14 NOTE — Telephone Encounter (Signed)
STOP TAKING INVOKANA

## 2016-05-20 ENCOUNTER — Ambulatory Visit (INDEPENDENT_AMBULATORY_CARE_PROVIDER_SITE_OTHER): Payer: 59 | Admitting: Nurse Practitioner

## 2016-05-20 ENCOUNTER — Telehealth: Payer: Self-pay | Admitting: *Deleted

## 2016-05-20 ENCOUNTER — Encounter: Payer: Self-pay | Admitting: Nurse Practitioner

## 2016-05-20 ENCOUNTER — Other Ambulatory Visit: Payer: Self-pay | Admitting: Internal Medicine

## 2016-05-20 VITALS — BP 112/78 | HR 93 | Temp 98.3°F | Wt 184.0 lb

## 2016-05-20 DIAGNOSIS — E118 Type 2 diabetes mellitus with unspecified complications: Secondary | ICD-10-CM

## 2016-05-20 DIAGNOSIS — R519 Headache, unspecified: Secondary | ICD-10-CM

## 2016-05-20 DIAGNOSIS — J069 Acute upper respiratory infection, unspecified: Secondary | ICD-10-CM | POA: Diagnosis not present

## 2016-05-20 DIAGNOSIS — Z794 Long term (current) use of insulin: Principal | ICD-10-CM

## 2016-05-20 DIAGNOSIS — R51 Headache: Secondary | ICD-10-CM | POA: Diagnosis not present

## 2016-05-20 MED ORDER — BUTALBITAL-APAP-CAFFEINE 50-325-40 MG PO TABS: 1.0000 | ORAL_TABLET | Freq: Four times a day (QID) | ORAL | 0 refills | Status: DC | PRN

## 2016-05-20 MED ORDER — KETOROLAC TROMETHAMINE 30 MG/ML IJ SOLN: 30.0000 mg | Freq: Once | INTRAMUSCULAR | Status: DC

## 2016-05-20 MED ORDER — EXENATIDE ER 2 MG/0.85ML ~~LOC~~ AUIJ: 1.0000 | AUTO-INJECTOR | SUBCUTANEOUS | 3 refills | Status: DC

## 2016-05-20 MED ORDER — KETOROLAC TROMETHAMINE 30 MG/ML IJ SOLN
30.0000 mg | Freq: Once | INTRAMUSCULAR | Status: AC
  Administered 2016-05-20: 30 mg via INTRAMUSCULAR

## 2016-05-20 MED ORDER — GUAIFENESIN ER 600 MG PO TB12: 600.0000 mg | ORAL_TABLET | Freq: Two times a day (BID) | ORAL | 0 refills | Status: DC | PRN

## 2016-05-20 MED ORDER — FLUTICASONE PROPIONATE 50 MCG/ACT NA SUSP: 2.0000 | Freq: Every day | NASAL | 3 refills | Status: DC

## 2016-05-20 MED ORDER — OXYMETAZOLINE HCL 0.05 % NA SOLN: 1.0000 | Freq: Two times a day (BID) | NASAL | 0 refills | Status: DC

## 2016-05-20 NOTE — Progress Notes (Signed)
Pre visit review using our clinic review tool, if applicable. No additional management support is needed unless otherwise documented below in the visit note. 

## 2016-05-20 NOTE — Telephone Encounter (Signed)
Rec'd call pt states the [place] cost $600, and needing MD to rx another alternative...[name]

## 2016-05-20 NOTE — Patient Instructions (Addendum)
URI Instructions: Flonase and Afrin use: apply 1spray of afrin in each nare, wait 10mins, then apply 2sprays of flonase in each nare. Use both nasal spray consecutively x 3days, then flonase only for at least 14days.  Encourage adequate oral hydration.  Use over-the-counter  "cold" medicines  such as "Tylenol cold" , "Advil cold",  "Mucinex" or" Mucinex D"  for cough and congestion.  Avoid decongestants if you have high blood pressure. Use" Delsym" or" Robitussin" cough syrup varietis for cough.  You can use plain "Tylenol" or "Advi"l for fever, chills and achyness.   "Common cold" symptoms are usually triggered by a virus.  The antibiotics are usually not necessary. On average, a" viral cold" illness would take 4-7 days to resolve. Please, make an appointment if you are not better or if you're worse.   Cluster Headache Cluster headaches are deeply painful. They normally occur on one side of your head, but they may switch sides. Often, cluster headaches:  Are severe.  Happen often for a few weeks or months and then go away for a while.  Last from 15 minutes to 3 hours.  Happen at the same time each day.  Happen at night.  Happen many times a day. Follow these instructions at home: Follow instructions from your doctor to care for yourself at home:  Go to bed at the same time each night. Get the same amount of sleep every night.  Avoid alcohol.  Stop smoking if you smoke. This includes cigarettes and e-cigarettes.  Take over-the-counter and prescription medicines only as told by your doctor.  Do not drive or use heavy machinery while taking prescription pain medicine.  Use oxygen as told by your doctor.  Exercise regularly.  Eat a healthy diet.  Write down when each headache happened, what kind of pain you had, how bad your pain was, and what you tried to help your pain. This is called a headache diary. Use it as told by your doctor. Contact a doctor if:  Your headaches  get worse or they happen more often.  Your medicines are not helping. Get help right away if:  You pass out (faint).  You get weak or lose feeling (have numbness) on one side of your body or face.  You see two of everything (double vision).  You feel sick to your stomach (nauseous) or you throw up (vomit), and you do not stop after many hours.  You have trouble with your balance or with walking.  You have trouble talking.  You have neck pain or stiffness.  You have a fever. This information is not intended to replace advice given to you by your health care provider. Make sure you discuss any questions you have with your health care provider. Document Released: 05/09/2004 Document Revised: 12/08/2015 Document Reviewed: 12/08/2015 Elsevier Interactive Patient Education  2017 [employer].

## 2016-05-20 NOTE — Progress Notes (Signed)
Subjective:  Patient ID: Carolyn [name], female    DOB: 02-12-1961  Age: 56 y.o. MRN: [id number]  CC: Headache (sinus pressure/;pain x4 days, mostly on right side, slight cough, has tried flonase and saline spray with no relief)   Headache   This is a recurrent problem. The current episode started in the past 7 days. The problem occurs constantly. The problem has been unchanged. The pain is located in the right unilateral region. The pain does not radiate. The pain quality is similar to prior headaches. The quality of the pain is described as aching (and pressure). Associated symptoms include coughing, drainage, muscle aches, rhinorrhea, sinus pressure, a sore throat and swollen glands. Pertinent negatives include no abnormal behavior, anorexia, blurred vision, dizziness, eye pain, fever, hearing loss, insomnia, loss of balance, nausea, neck pain, numbness, phonophobia, photophobia, scalp tenderness, seizures, tingling, tinnitus, visual change, vomiting, weakness or weight loss. She has tried nothing for the symptoms. Her past medical history is significant for migraine headaches, obesity and sinus disease. There is no history of cancer, cluster headaches, hypertension, immunosuppression, migraines in the family, pseudotumor cerebri, recent head traumas or TMJ.    Outpatient Medications Prior to Visit  Medication Sig Dispense Refill  . aspirin EC 81 MG EC tablet Take 1 tablet (81 mg total) by mouth daily.    . Azilsartan Medoxomil (EDARBI) 80 MG TABS Take 1 tablet (80 mg total) by mouth daily. 90 tablet 1  . Blood Glucose Monitoring Suppl (ONETOUCH VERIO IQ SYSTEM) W/DEVICE KIT 1 Act by Does not apply route 3 (three) times daily. 2 kit 0  . Calcium Carbonate-Vit D-Min (CALCIUM 1200 PO) Take 1 tablet by mouth daily.    . cetirizine (ZYRTEC) 10 MG chewable tablet Chew 10 mg by mouth daily.    . cloNIDine (CATAPRES) 0.1 MG tablet Take 2 tablets (0.2 mg total) by mouth daily. 180 tablet 1  . glucose  blood (ONE TOUCH ULTRA TEST) test strip Use to test blood sugar three times a week and prn if having symptoms of low blood sugar Dx: 250.92 90 day supply 100 each 11  . glucose blood (ONETOUCH VERIO) test strip Use TID 100 each 12  . Icosapent Ethyl (VASCEPA) 1 g CAPS Take 2 capsules by mouth 2 (two) times daily. 120 capsule 11  . INVOKANA 300 MG TABS tablet TAKE ONE TABLET BY MOUTH ONCE DAILY 30 tablet 2  . labetalol (NORMODYNE) 300 MG tablet Take 1 tablet (300 mg total) by mouth 2 (two) times daily. 180 tablet 2  . linaclotide (LINZESS) 145 MCG CAPS capsule Take 1 capsule (145 mcg total) by mouth daily before breakfast. 90 capsule 3  . omeprazole (PRILOSEC) 40 MG capsule Take 1 capsule by mouth  twice daily at 8am and 10pm 180 capsule 2  . simvastatin (ZOCOR) 40 MG tablet TAKE ONE TABLET BY MOUTH AT BEDTIME 90 tablet 3  . Suvorexant (BELSOMRA) 15 MG TABS Take 1 tablet by mouth at bedtime as needed. 30 tablet 5  . triamterene-hydrochlorothiazide (DYAZIDE) 37.5-25 MG capsule TAKE ONE CAPSULE BY MOUTH ONCE DAILY 30 capsule 5  . Exenatide ER (BYDUREON) 2 MG PEN Inject 1 Act into the skin once a week. 4 each 5  . fluticasone (FLONASE) 50 MCG/ACT nasal spray Place 2 sprays into both nostrils daily. 48 g 3  . naproxen (NAPROSYN) 500 MG tablet Take 1 tablet (500 mg total) by mouth 2 (two) times daily with a meal. 30 tablet 0  . TANZEUM 50 MG  PEN INJECT 1 ACT INTO THE SKIN ONCE A WEEK 12 each 1   No facility-administered medications prior to visit.     ROS See HPI  Objective:  BP 112/78   Pulse 93   Temp 98.3 F (36.8 C)   Wt 184 lb (83.5 kg)   SpO2 98%   BMI 30.62 kg/m   BP Readings from Last 3 Encounters:  05/20/16 112/78  02/08/16 126/86  12/12/15 (!) 142/86    Wt Readings from Last 3 Encounters:  05/20/16 184 lb (83.5 kg)  02/08/16 181 lb (82.1 kg)  12/12/15 183 lb 4 oz (83.1 kg)    Physical Exam  Constitutional: She is oriented to person, place, and time.  HENT:  Right  Ear: Tympanic membrane, external ear and ear canal normal.  Left Ear: Tympanic membrane, external ear and ear canal normal.  Nose: Mucosal edema and rhinorrhea present. Right sinus exhibits maxillary sinus tenderness. Right sinus exhibits no frontal sinus tenderness. Left sinus exhibits maxillary sinus tenderness. Left sinus exhibits no frontal sinus tenderness.  Mouth/Throat: Uvula is midline. No trismus in the jaw. Posterior oropharyngeal erythema present. No oropharyngeal exudate.  Eyes: No scleral icterus.  Neck: Normal range of motion. Neck supple.  Cardiovascular: Normal rate and normal heart sounds.   Pulmonary/Chest: Effort normal and breath sounds normal.  Musculoskeletal: She exhibits no edema.  Lymphadenopathy:    She has no cervical adenopathy.  Neurological: She is alert and oriented to person, place, and time.  Vitals reviewed.   Lab Results  Component Value Date   WBC 9.1 02/08/2016   HGB 11.8 (L) 02/08/2016   HCT 35.0 (L) 02/08/2016   PLT 334.0 02/08/2016   GLUCOSE 110 (H) 02/09/2016   CHOL 192 05/25/2015   TRIG 389.0 (H) 12/12/2015   HDL 38.60 (L) 05/25/2015   LDLDIRECT 87.0 05/25/2015   LDLCALC 68 07/30/2013   ALT 18 02/08/2016   AST 11 02/08/2016   NA 140 02/09/2016   K 4.3 02/09/2016   CL 103 02/09/2016   CREATININE 1.11 02/09/2016   BUN 32 (H) 02/09/2016   CO2 24 02/09/2016   TSH 2.40 10/27/2014   INR 1.04 01/14/2013   HGBA1C 6.1 12/12/2015   MICROALBUR 20.3 (H) 12/12/2015    Ct Angio Chest Pe W Or Wo Contrast  Result Date: 02/09/2016 CLINICAL DATA:  56 y/o F; acute bilateral thoracic pain with increased D-dimer. EXAM: CT ANGIOGRAPHY CHEST WITH CONTRAST TECHNIQUE: Multidetector CT imaging of the chest was performed using the standard protocol during bolus administration of intravenous contrast. Multiplanar CT image reconstructions and MIPs were obtained to evaluate the vascular anatomy. CONTRAST:  100 cc Isovue 370 COMPARISON:  None. FINDINGS:  Cardiovascular: Satisfactory opacification of the pulmonary arteries to the segmental level. No evidence of pulmonary embolism. Borderline cardiomegaly. No pericardial effusion. Coronary artery calcifications. Normal caliber thoracic aorta. Aortic atherosclerosis with mild calcification. Mediastinum/Nodes: No enlarged mediastinal, hilar, or axillary lymph nodes. Thyroid gland, trachea, and esophagus demonstrate no significant findings. Lungs/Pleura: Lungs are clear. No pleural effusion or pneumothorax. Upper Abdomen: No acute abnormality. Musculoskeletal: No chest wall abnormality. No acute or significant osseous findings. Review of the MIP images confirms the above findings. IMPRESSION: 1. No evidence of pulmonary embolism. No acute process identified as explanation for pain. 2. Coronary artery calcifications and aortic atherosclerosis. Electronically Signed   By: Kristine Garbe M.D.   On: 02/09/2016 15:21    Assessment & Plan:   Aina was seen today for headache.  Diagnoses and all orders  for this visit:  Acute URI -     fluticasone (FLONASE) 50 MCG/ACT nasal spray; Place 2 sprays into both nostrils daily. -     oxymetazoline (AFRIN NASAL SPRAY) 0.05 % nasal spray; Place 1 spray into both nostrils 2 (two) times daily. Use only for 3days, then stop -     guaiFENesin (MUCINEX) 600 MG 12 hr tablet; Take 1 tablet (600 mg total) by mouth 2 (two) times daily as needed for cough or to loosen phlegm.  Acute nonintractable headache, unspecified headache type -     butalbital-acetaminophen-caffeine (FIORICET, ESGIC) 50-325-40 MG tablet; Take 1 tablet by mouth every 6 (six) hours as needed for headache. -     Discontinue: ketorolac (TORADOL) 30 MG/ML injection 30 mg; Inject 1 mL (30 mg total) into the muscle once. -     ketorolac (TORADOL) 30 MG/ML injection 30 mg; Inject 1 mL (30 mg total) into the muscle once.   I have discontinued Carolyn [name]'s naproxen. I am also having her start on  oxymetazoline, guaiFENesin, and butalbital-acetaminophen-caffeine. Additionally, I am having her maintain her aspirin, glucose blood, cetirizine, Calcium Carbonate-Vit D-Min (CALCIUM 1200 PO), glucose blood, ONETOUCH VERIO IQ SYSTEM, labetalol, linaclotide, Icosapent Ethyl, Suvorexant, cloNIDine, Azilsartan Medoxomil, triamterene-hydrochlorothiazide, INVOKANA, omeprazole, simvastatin, and fluticasone. We administered ketorolac.  Meds ordered this encounter  Medications  . fluticasone (FLONASE) 50 MCG/ACT nasal spray    Sig: Place 2 sprays into both nostrils daily.    Dispense:  48 g    Refill:  3    Order Specific Question:   Supervising Provider    Answer:   Cassandria Anger [[id number]]  . oxymetazoline (AFRIN NASAL SPRAY) 0.05 % nasal spray    Sig: Place 1 spray into both nostrils 2 (two) times daily. Use only for 3days, then stop    Dispense:  30 mL    Refill:  0    Order Specific Question:   Supervising Provider    Answer:   Cassandria Anger [[id number]]  . guaiFENesin (MUCINEX) 600 MG 12 hr tablet    Sig: Take 1 tablet (600 mg total) by mouth 2 (two) times daily as needed for cough or to loosen phlegm.    Dispense:  14 tablet    Refill:  0    Order Specific Question:   Supervising Provider    Answer:   Cassandria Anger [[id number]]  . butalbital-acetaminophen-caffeine (FIORICET, ESGIC) 50-325-40 MG tablet    Sig: Take 1 tablet by mouth every 6 (six) hours as needed for headache.    Dispense:  20 tablet    Refill:  0    Order Specific Question:   Supervising Provider    Answer:   Cassandria Anger [[id number]]  . DISCONTD: ketorolac (TORADOL) 30 MG/ML injection 30 mg  . ketorolac (TORADOL) 30 MG/ML injection 30 mg    Follow-up: Return if symptoms worsen or fail to improve.  Wilfred Lacy, NP

## 2016-05-21 NOTE — Telephone Encounter (Signed)
Notified pt of med change../lmb 

## 2016-05-21 NOTE — Telephone Encounter (Signed)
changed

## 2016-05-22 ENCOUNTER — Telehealth: Payer: Self-pay | Admitting: Nurse Practitioner

## 2016-05-22 DIAGNOSIS — R51 Headache: Principal | ICD-10-CM

## 2016-05-22 DIAGNOSIS — R519 Headache, unspecified: Secondary | ICD-10-CM

## 2016-05-22 DIAGNOSIS — R42 Dizziness and giddiness: Secondary | ICD-10-CM

## 2016-05-22 NOTE — Telephone Encounter (Signed)
Please advise 

## 2016-05-22 NOTE — Telephone Encounter (Signed)
Pt aware of [place] response.  

## 2016-05-22 NOTE — Telephone Encounter (Signed)
Patient states she was told to call back if she was not feeling any better.  States she still has vertigo and she is throwing up.  Please follow up in regard.

## 2016-05-24 ENCOUNTER — Other Ambulatory Visit: Payer: 59

## 2016-05-30 ENCOUNTER — Other Ambulatory Visit: Payer: 59

## 2016-05-31 ENCOUNTER — Ambulatory Visit
Admission: RE | Admit: 2016-05-31 | Discharge: 2016-05-31 | Disposition: A | Discharge status: Home / Self Care | Payer: 59 | Source: Ambulatory Visit | Attending: Nurse Practitioner | Admitting: Nurse Practitioner

## 2016-05-31 ENCOUNTER — Telehealth: Payer: Self-pay | Admitting: Internal Medicine

## 2016-05-31 DIAGNOSIS — R42 Dizziness and giddiness: Secondary | ICD-10-CM

## 2016-05-31 DIAGNOSIS — R51 Headache: Principal | ICD-10-CM

## 2016-05-31 DIAGNOSIS — R519 Headache, unspecified: Secondary | ICD-10-CM

## 2016-05-31 NOTE — Telephone Encounter (Signed)
Pt called stated pharmacy stating that Linzess need to start PA on this. Pleas check and call pt back, pt is out of this med.

## 2016-06-03 ENCOUNTER — Other Ambulatory Visit: Payer: Self-pay | Admitting: Cardiovascular Disease

## 2016-06-04 NOTE — Telephone Encounter (Signed)
New Message  Pt call requesting to speak with RN about getting refills for her medications. Please call back to discuss

## 2016-06-05 NOTE — Progress Notes (Signed)
Patient ID: Carolyn [name], female   DOB: 1961-02-17, 56 y.o.   MRN: [id number]   55 y.o. with HTN Echo done 5/6 /15 with EF 50% mild diffuse hypokinesis Seen by pulmonary for ? Sleep apnea but does not qualify for CPAP  She is not happy with her primary MD Does not feel that her BP is well controlled. He has not addressed her anemia and he is "scaring" me  About my heart.  She had cath 10/14 with no CAD Ef was normal No RAS and EDP 14  She is compliant with her meds  Has fatigue malaise and some anxiety  Echo 08/21/15 EF normal 55-60% trivial AR    Last visit started on labatolol  For better BP control  Doing well Still having some GI issues with dysmotility  Sees Carlean Purl Could not tolerate Reglan  Two older sons  Just got divorced from husband of 21 years  ROS: Denies fever, malais, weight loss, blurry vision, decreased visual acuity, cough, sputum, SOB, hemoptysis, pleuritic pain, palpitaitons, heartburn, abdominal pain, melena, lower extremity edema, claudication, or rash.  All other systems reviewed and negative  General: Affect appropriate Healthy:  appears stated age 19: normal Neck supple with no adenopathy JVP normal no bruits no thyromegaly Lungs clear with no wheezing and good diaphragmatic motion Heart:  S1/S2 no murmur, no rub, gallop or click PMI normal Abdomen: benighn, BS positve, no tenderness, no AAA no bruit.  No HSM or HJR Distal pulses intact with no bruits No edema Neuro non-focal Skin warm and dry No muscular weakness   Current Outpatient Prescriptions  Medication Sig Dispense Refill  . aspirin EC 81 MG EC tablet Take 1 tablet (81 mg total) by mouth daily.    . Azilsartan Medoxomil (EDARBI) 80 MG TABS Take 1 tablet (80 mg total) by mouth daily. 90 tablet 1  . Blood Glucose Monitoring Suppl (ONETOUCH VERIO IQ SYSTEM) W/DEVICE KIT 1 Act by Does not apply route 3 (three) times daily. 2 kit 0  . butalbital-acetaminophen-caffeine (FIORICET, ESGIC) 50-325-40  MG tablet Take 1 tablet by mouth every 6 (six) hours as needed for headache. 20 tablet 0  . Calcium Carbonate-Vit D-Min (CALCIUM 1200 PO) Take 1 tablet by mouth daily.    . cetirizine (ZYRTEC) 10 MG chewable tablet Chew 10 mg by mouth daily.    . cloNIDine (CATAPRES) 0.1 MG tablet Take 2 tablets (0.2 mg total) by mouth daily. 180 tablet 1  . Exenatide ER (BYDUREON BCISE) 2 MG/0.85ML AUIJ Inject 1 Act into the skin once a week. 12 pen 3  . fluticasone (FLONASE) 50 MCG/ACT nasal spray Place 2 sprays into both nostrils daily. 48 g 3  . glucose blood (ONE TOUCH ULTRA TEST) test strip Use to test blood sugar three times a week and prn if having symptoms of low blood sugar Dx: 250.92 90 day supply 100 each 11  . glucose blood (ONETOUCH VERIO) test strip Use TID 100 each 12  . guaiFENesin (MUCINEX) 600 MG 12 hr tablet Take 1 tablet (600 mg total) by mouth 2 (two) times daily as needed for cough or to loosen phlegm. 14 tablet 0  . Icosapent Ethyl (VASCEPA) 1 g CAPS Take 2 capsules by mouth 2 (two) times daily. 120 capsule 11  . INVOKANA 300 MG TABS tablet TAKE ONE TABLET BY MOUTH ONCE DAILY 30 tablet 2  . labetalol (NORMODYNE) 300 MG tablet TAKE ONE TABLET BY MOUTH TWICE DAILY 180 tablet 0  . linaclotide (LINZESS) 145  MCG CAPS capsule Take 1 capsule (145 mcg total) by mouth daily before breakfast. 90 capsule 3  . omeprazole (PRILOSEC) 40 MG capsule Take 1 capsule by mouth  twice daily at 8am and 10pm 180 capsule 2  . oxymetazoline (AFRIN NASAL SPRAY) 0.05 % nasal spray Place 1 spray into both nostrils 2 (two) times daily. Use only for 3days, then stop 30 mL 0  . simvastatin (ZOCOR) 40 MG tablet TAKE ONE TABLET BY MOUTH AT BEDTIME 90 tablet 3  . Suvorexant (BELSOMRA) 15 MG TABS Take 1 tablet by mouth at bedtime as needed. 30 tablet 5  . triamterene-hydrochlorothiazide (DYAZIDE) 37.5-25 MG capsule TAKE ONE CAPSULE BY MOUTH ONCE DAILY 30 capsule 5   No current facility-administered medications for this  visit.     Allergies  Metformin and related; Invokana [canagliflozin]; Codeine; Flexeril [cyclobenzaprine]; Morphine and related; and Reglan [metoclopramide]  Electrocardiogram:  SR LVH  11/22/13   Assessment and Plan DCM: improved EF on last echo f/u echo 08/2015 continue current meds HTN: Well controlled.  Continue current medications and low sodium Dash type diet.   Anemia f/u primary  Dysmotility fiu GI DM: Discussed low carb diet.  Target hemoglobin A1c is 6.5 or less.  Continue current medications. She is off invokana and last A1c was 6.0  Chol:  LDL 78 normal LFT;s continue zocor   [employer]

## 2016-06-06 ENCOUNTER — Ambulatory Visit (INDEPENDENT_AMBULATORY_CARE_PROVIDER_SITE_OTHER): Payer: 59 | Admitting: Cardiovascular Disease

## 2016-06-06 ENCOUNTER — Encounter: Payer: Self-pay | Admitting: Cardiovascular Disease

## 2016-06-06 VITALS — BP 120/84 | HR 95 | Ht 65.0 in | Wt 183.4 lb

## 2016-06-06 DIAGNOSIS — I10 Essential (primary) hypertension: Secondary | ICD-10-CM | POA: Diagnosis not present

## 2016-06-06 DIAGNOSIS — I426 Alcoholic cardiomyopathy: Secondary | ICD-10-CM

## 2016-06-06 NOTE — Patient Instructions (Signed)

## 2016-06-07 ENCOUNTER — Telehealth: Payer: Self-pay | Admitting: Internal Medicine

## 2016-06-07 DIAGNOSIS — K5901 Slow transit constipation: Secondary | ICD-10-CM

## 2016-06-07 MED ORDER — LINACLOTIDE 145 MCG PO CAPS: 145.0000 ug | ORAL_CAPSULE | Freq: Every day | ORAL | 0 refills | Status: DC

## 2016-06-07 NOTE — Telephone Encounter (Addendum)
Samples are up front for her to pick up.   Called and informed of same.  [place] Brye [id number]  PA started.

## 2016-06-07 NOTE — Telephone Encounter (Signed)
Patient would like to pick up samples

## 2016-06-07 NOTE — Telephone Encounter (Signed)
Patient states she is checking on PA for Linzess.  States she is out of medication and has not been to the restroom in 4 days.  Would like to know status?

## 2016-06-10 NOTE — Telephone Encounter (Signed)
PA for Linzess renewed. (Key: [id number])

## 2016-06-12 NOTE — Telephone Encounter (Signed)
Pt informed of same.  

## 2016-06-12 NOTE — Telephone Encounter (Signed)
PA was approved. 

## 2016-06-14 NOTE — Telephone Encounter (Signed)
Called pt again to inform of approval.

## 2016-06-18 LAB — HM DIABETES EYE EXAM

## 2016-06-25 ENCOUNTER — Encounter: Payer: 59 | Admitting: Internal Medicine

## 2016-07-05 LAB — HM DIABETES EYE EXAM

## 2016-07-11 ENCOUNTER — Encounter: Payer: Self-pay | Admitting: Internal Medicine

## 2016-07-11 ENCOUNTER — Ambulatory Visit (INDEPENDENT_AMBULATORY_CARE_PROVIDER_SITE_OTHER): Payer: 59 | Admitting: Internal Medicine

## 2016-07-11 ENCOUNTER — Other Ambulatory Visit (INDEPENDENT_AMBULATORY_CARE_PROVIDER_SITE_OTHER): Payer: 59

## 2016-07-11 VITALS — BP 132/80 | HR 86 | Temp 98.4°F | Resp 16 | Ht 65.0 in | Wt 186.8 lb

## 2016-07-11 DIAGNOSIS — Z0000 Encounter for general adult medical examination without abnormal findings: Secondary | ICD-10-CM | POA: Diagnosis not present

## 2016-07-11 DIAGNOSIS — R7989 Other specified abnormal findings of blood chemistry: Secondary | ICD-10-CM

## 2016-07-11 DIAGNOSIS — D539 Nutritional anemia, unspecified: Secondary | ICD-10-CM

## 2016-07-11 DIAGNOSIS — E118 Type 2 diabetes mellitus with unspecified complications: Secondary | ICD-10-CM | POA: Diagnosis not present

## 2016-07-11 DIAGNOSIS — F410 Panic disorder [episodic paroxysmal anxiety] without agoraphobia: Secondary | ICD-10-CM | POA: Diagnosis not present

## 2016-07-11 DIAGNOSIS — D649 Anemia, unspecified: Secondary | ICD-10-CM | POA: Insufficient documentation

## 2016-07-11 LAB — FERRITIN: FERRITIN: 157.6 ng/mL (ref 10.0–291.0)

## 2016-07-11 LAB — MICROALBUMIN / CREATININE URINE RATIO
Creatinine,U: 196.3 mg/dL
MICROALB/CREAT RATIO: 10 mg/g (ref 0.0–30.0)
Microalb, Ur: 19.7 mg/dL — ABNORMAL HIGH (ref 0.0–1.9)

## 2016-07-11 LAB — CBC WITH DIFFERENTIAL/PLATELET
BASOS ABS: 0.1 10*3/uL (ref 0.0–0.1)
Basophils Relative: 0.7 % (ref 0.0–3.0)
Eosinophils Absolute: 0.3 10*3/uL (ref 0.0–0.7)
Eosinophils Relative: 3.7 % (ref 0.0–5.0)
HCT: 31.2 % — ABNORMAL LOW (ref 36.0–46.0)
Hemoglobin: 10.8 g/dL — ABNORMAL LOW (ref 12.0–15.0)
LYMPHS ABS: 2.6 10*3/uL (ref 0.7–4.0)
Lymphocytes Relative: 31 % (ref 12.0–46.0)
MCHC: 34.5 g/dL (ref 30.0–36.0)
MCV: 86 fl (ref 78.0–100.0)
MONOS PCT: 5 % (ref 3.0–12.0)
Monocytes Absolute: 0.4 10*3/uL (ref 0.1–1.0)
NEUTROS PCT: 59.6 % (ref 43.0–77.0)
Neutro Abs: 4.9 10*3/uL (ref 1.4–7.7)
Platelets: 281 10*3/uL (ref 150.0–400.0)
RBC: 3.63 Mil/uL — AB (ref 3.87–5.11)
RDW: 13.7 % (ref 11.5–15.5)
WBC: 8.3 10*3/uL (ref 4.0–10.5)

## 2016-07-11 LAB — VITAMIN B12: VITAMIN B 12: 699 pg/mL (ref 211–911)

## 2016-07-11 LAB — LIPID PANEL
CHOL/HDL RATIO: 5
Cholesterol: 182 mg/dL (ref 0–200)
HDL: 36.7 mg/dL — ABNORMAL LOW (ref >39.00)
Triglycerides: 421 mg/dL — ABNORMAL HIGH (ref 0.0–149.0)

## 2016-07-11 LAB — IBC PANEL
Iron: 75 ug/dL (ref 42–145)
SATURATION RATIOS: 21.7 % (ref 20.0–50.0)
TRANSFERRIN: 247 mg/dL (ref 212.0–360.0)

## 2016-07-11 LAB — COMPREHENSIVE METABOLIC PANEL
ALK PHOS: 60 U/L (ref 39–117)
ALT: 31 U/L (ref 0–35)
AST: 16 U/L (ref 0–37)
Albumin: 4.7 g/dL (ref 3.5–5.2)
BILIRUBIN TOTAL: 0.3 mg/dL (ref 0.2–1.2)
BUN: 17 mg/dL (ref 6–23)
CALCIUM: 10 mg/dL (ref 8.4–10.5)
CO2: 26 mEq/L (ref 19–32)
Chloride: 101 mEq/L (ref 96–112)
Creatinine, Ser: 0.92 mg/dL (ref 0.40–1.20)
GFR: 81.69 mL/min (ref >60.00)
GLUCOSE: 136 mg/dL — AB (ref 70–99)
Potassium: 4 mEq/L (ref 3.5–5.1)
Sodium: 137 mEq/L (ref 135–145)
TOTAL PROTEIN: 7.7 g/dL (ref 6.0–8.3)

## 2016-07-11 LAB — RETICULOCYTES
ABS Retic: 61540 cells/uL (ref 20000–80000)
RBC.: 3.62 MIL/uL — ABNORMAL LOW (ref 3.80–5.10)
Retic Ct Pct: 1.7 %

## 2016-07-11 LAB — URINALYSIS, ROUTINE W REFLEX MICROSCOPIC
BILIRUBIN URINE: NEGATIVE
HGB URINE DIPSTICK: NEGATIVE
KETONES UR: NEGATIVE
LEUKOCYTES UA: NEGATIVE
NITRITE: NEGATIVE
PH: 6 (ref 5.0–8.0)
RBC / HPF: NONE SEEN (ref 0–?)
Specific Gravity, Urine: 1.02 (ref 1.000–1.030)
Total Protein, Urine: 30 — AB
URINE GLUCOSE: NEGATIVE
UROBILINOGEN UA: 0.2 (ref 0.0–1.0)

## 2016-07-11 LAB — FOLATE: Folate: 18.1 ng/mL (ref >5.9)

## 2016-07-11 LAB — LDL CHOLESTEROL, DIRECT: Direct LDL: 78 mg/dL

## 2016-07-11 LAB — HEMOGLOBIN A1C: Hgb A1c MFr Bld: 6.8 % — ABNORMAL HIGH (ref 4.6–6.5)

## 2016-07-11 MED ORDER — CLONAZEPAM 0.5 MG PO TABS: 0.5000 mg | ORAL_TABLET | Freq: Two times a day (BID) | ORAL | 2 refills | Status: DC | PRN

## 2016-07-11 NOTE — Patient Instructions (Signed)
Health Maintenance, Female Adopting a healthy lifestyle and getting preventive care can go a long way to promote health and wellness. Talk with your health care provider about what schedule of regular examinations is right for you. This is a good chance for you to check in with your provider about disease prevention and staying healthy. In between checkups, there are plenty of things you can do on your own. Experts have done a lot of research about which lifestyle changes and preventive measures are most likely to keep you healthy. Ask your health care provider for more information. Weight and diet Eat a healthy diet  Be sure to include plenty of vegetables, fruits, low-fat dairy products, and lean protein.  Do not eat a lot of foods high in solid fats, added sugars, or salt.  Get regular exercise. This is one of the most important things you can do for your health.  Most adults should exercise for at least 150 minutes each week. The exercise should increase your heart rate and make you sweat (moderate-intensity exercise).  Most adults should also do strengthening exercises at least twice a week. This is in addition to the moderate-intensity exercise. Maintain a healthy weight  Body mass index (BMI) is a measurement that can be used to identify possible weight problems. It estimates body fat based on height and weight. Your health care provider can help determine your BMI and help you achieve or maintain a healthy weight.  For females 76 years of age and older:  A BMI below 18.5 is considered underweight.  A BMI of 18.5 to 24.9 is normal.  A BMI of 25 to 29.9 is considered overweight.  A BMI of 30 and above is considered obese. Watch levels of cholesterol and blood lipids  You should start having your blood tested for lipids and cholesterol at 55 years of age, then have this test every 5 years.  You may need to have your cholesterol levels checked more often if:  Your lipid or  cholesterol levels are high.  You are older than 55 years of age.  You are at high risk for heart disease. Cancer screening Lung Cancer  Lung cancer screening is recommended for adults 64-42 years old who are at high risk for lung cancer because of a history of smoking.  A yearly low-dose CT scan of the lungs is recommended for people who:  Currently smoke.  Have quit within the past 15 years.  Have at least a 30-pack-year history of smoking. A pack year is smoking an average of one pack of cigarettes a day for 1 year.  Yearly screening should continue until it has been 15 years since you quit.  Yearly screening should stop if you develop a health problem that would prevent you from having lung cancer treatment. Breast Cancer  Practice breast self-awareness. This means understanding how your breasts normally appear and feel.  It also means doing regular breast self-exams. Let your health care provider know about any changes, no matter how small.  If you are in your 20s or 30s, you should have a clinical breast exam (CBE) by a health care provider every 1-3 years as part of a regular health exam.  If you are 34 or older, have a CBE every year. Also consider having a breast X-ray (mammogram) every year.  If you have a family history of breast cancer, talk to your health care provider about genetic screening.  If you are at high risk for breast cancer, talk  to your health care provider about having an MRI and a mammogram every year.  Breast cancer gene (BRCA) assessment is recommended for women who have family members with BRCA-related cancers. BRCA-related cancers include:  Breast.  Ovarian.  Tubal.  Peritoneal cancers.  Results of the assessment will determine the need for genetic counseling and BRCA1 and BRCA2 testing. Cervical Cancer  Your health care provider may recommend that you be screened regularly for cancer of the pelvic organs (ovaries, uterus, and vagina).  This screening involves a pelvic examination, including checking for microscopic changes to the surface of your cervix (Pap test). You may be encouraged to have this screening done every 3 years, beginning at age 24.  For women ages 66-65, health care providers may recommend pelvic exams and Pap testing every 3 years, or they may recommend the Pap and pelvic exam, combined with testing for human papilloma virus (HPV), every 5 years. Some types of HPV increase your risk of cervical cancer. Testing for HPV may also be done on women of any age with unclear Pap test results.  Other health care providers may not recommend any screening for nonpregnant women who are considered low risk for pelvic cancer and who do not have symptoms. Ask your health care provider if a screening pelvic exam is right for you.  If you have had past treatment for cervical cancer or a condition that could lead to cancer, you need Pap tests and screening for cancer for at least 20 years after your treatment. If Pap tests have been discontinued, your risk factors (such as having a new sexual partner) need to be reassessed to determine if screening should resume. Some women have medical problems that increase the chance of getting cervical cancer. In these cases, your health care provider may recommend more frequent screening and Pap tests. Colorectal Cancer  This type of cancer can be detected and often prevented.  Routine colorectal cancer screening usually begins at 55 years of age and continues through 55 years of age.  Your health care provider may recommend screening at an earlier age if you have risk factors for colon cancer.  Your health care provider may also recommend using home test kits to check for hidden blood in the stool.  A small camera at the end of a tube can be used to examine your colon directly (sigmoidoscopy or colonoscopy). This is done to check for the earliest forms of colorectal cancer.  Routine  screening usually begins at age 41.  Direct examination of the colon should be repeated every 5-10 years through 55 years of age. However, you may need to be screened more often if early forms of precancerous polyps or small growths are found. Skin Cancer  Check your skin from head to toe regularly.  Tell your health care provider about any new moles or changes in moles, especially if there is a change in a mole's shape or color.  Also tell your health care provider if you have a mole that is larger than the size of a pencil eraser.  Always use sunscreen. Apply sunscreen liberally and repeatedly throughout the day.  Protect yourself by wearing long sleeves, pants, a wide-brimmed hat, and sunglasses whenever you are outside. Heart disease, diabetes, and high blood pressure  High blood pressure causes heart disease and increases the risk of stroke. High blood pressure is more likely to develop in:  People who have blood pressure in the high end of the normal range (130-139/85-89 mm Hg).  People who are overweight or obese.  People who are African American.  If you are 59-24 years of age, have your blood pressure checked every 3-5 years. If you are 34 years of age or older, have your blood pressure checked every year. You should have your blood pressure measured twice-once when you are at a hospital or clinic, and once when you are not at a hospital or clinic. Record the average of the two measurements. To check your blood pressure when you are not at a hospital or clinic, you can use:  An automated blood pressure machine at a pharmacy.  A home blood pressure monitor.  If you are between 29 years and 60 years old, ask your health care provider if you should take aspirin to prevent strokes.  Have regular diabetes screenings. This involves taking a blood sample to check your fasting blood sugar level.  If you are at a normal weight and have a low risk for diabetes, have this test once  every three years after 55 years of age.  If you are overweight and have a high risk for diabetes, consider being tested at a younger age or more often. Preventing infection Hepatitis B  If you have a higher risk for hepatitis B, you should be screened for this virus. You are considered at high risk for hepatitis B if:  You were born in a country where hepatitis B is common. Ask your health care provider which countries are considered high risk.  Your parents were born in a high-risk country, and you have not been immunized against hepatitis B (hepatitis B vaccine).  You have HIV or AIDS.  You use needles to inject street drugs.  You live with someone who has hepatitis B.  You have had sex with someone who has hepatitis B.  You get hemodialysis treatment.  You take certain medicines for conditions, including cancer, organ transplantation, and autoimmune conditions. Hepatitis C  Blood testing is recommended for:  Everyone born from [birth year] through 1964.  Anyone with known risk factors for hepatitis C. Sexually transmitted infections (STIs)  You should be screened for sexually transmitted infections (STIs) including gonorrhea and chlamydia if:  You are sexually active and are younger than 55 years of age.  You are older than 55 years of age and your health care provider tells you that you are at risk for this type of infection.  Your sexual activity has changed since you were last screened and you are at an increased risk for chlamydia or gonorrhea. Ask your health care provider if you are at risk.  If you do not have HIV, but are at risk, it may be recommended that you take a prescription medicine daily to prevent HIV infection. This is called pre-exposure prophylaxis (PrEP). You are considered at risk if:  You are sexually active and do not regularly use condoms or know the HIV status of your partner(s).  You take drugs by injection.  You are sexually active with a partner  who has HIV. Talk with your health care provider about whether you are at high risk of being infected with HIV. If you choose to begin PrEP, you should first be tested for HIV. You should then be tested every 3 months for as long as you are taking PrEP. Pregnancy  If you are premenopausal and you may become pregnant, ask your health care provider about preconception counseling.  If you may become pregnant, take 400 to 800 micrograms (mcg) of folic acid  every day.  If you want to prevent pregnancy, talk to your health care provider about birth control (contraception). Osteoporosis and menopause  Osteoporosis is a disease in which the bones lose minerals and strength with aging. This can result in serious bone fractures. Your risk for osteoporosis can be identified using a bone density scan.  If you are 4 years of age or older, or if you are at risk for osteoporosis and fractures, ask your health care provider if you should be screened.  Ask your health care provider whether you should take a calcium or vitamin D supplement to lower your risk for osteoporosis.  Menopause may have certain physical symptoms and risks.  Hormone replacement therapy may reduce some of these symptoms and risks. Talk to your health care provider about whether hormone replacement therapy is right for you. Follow these instructions at home:  Schedule regular health, dental, and eye exams.  Stay current with your immunizations.  Do not use any tobacco products including cigarettes, chewing tobacco, or electronic cigarettes.  If you are pregnant, do not drink alcohol.  If you are breastfeeding, limit how much and how often you drink alcohol.  Limit alcohol intake to no more than 1 drink per day for nonpregnant women. One drink equals 12 ounces of beer, 5 ounces of wine, or 1 ounces of hard liquor.  Do not use street drugs.  Do not share needles.  Ask your health care provider for help if you need support  or information about quitting drugs.  Tell your health care provider if you often feel depressed.  Tell your health care provider if you have ever been abused or do not feel safe at home. This information is not intended to replace advice given to you by your health care provider. Make sure you discuss any questions you have with your health care provider. Document Released: 10/15/2010 Document Revised: 09/07/2015 Document Reviewed: 01/03/2015 Elsevier Interactive Patient Education  2017 [employer].

## 2016-07-11 NOTE — Progress Notes (Signed)
Subjective:  Patient ID: Carolyn [name], female    DOB: 04/25/1960  Age: 56 y.o. MRN: [id number]  CC: Annual Exam; Hypertension; Hyperlipidemia; Diabetes; and Anemia   HPI Carolyn [name] presents for a CPX.  She returns for follow-up on multiple medical problems. She tells me that she is doing a 6 mile walk/run several times a week and has had no episodes of CP, DOE, SOB, edema, or fatigue. She is concerned about her chronic anemia and the only related symptom is tingling in both feet. She has not noticed any sources of blood loss.  Outpatient Medications Prior to Visit  Medication Sig Dispense Refill  . aspirin EC 81 MG EC tablet Take 1 tablet (81 mg total) by mouth daily.    . Azilsartan Medoxomil (EDARBI) 80 MG TABS Take 1 tablet (80 mg total) by mouth daily. 90 tablet 1  . Blood Glucose Monitoring Suppl (ONETOUCH VERIO IQ SYSTEM) W/DEVICE KIT 1 Act by Does not apply route 3 (three) times daily. 2 kit 0  . Calcium Carbonate-Vit D-Min (CALCIUM 1200 PO) Take 1 tablet by mouth daily.    . cetirizine (ZYRTEC) 10 MG chewable tablet Chew 10 mg by mouth daily.    . cloNIDine (CATAPRES) 0.1 MG tablet Take 2 tablets (0.2 mg total) by mouth daily. 180 tablet 1  . Exenatide ER (BYDUREON BCISE) 2 MG/0.85ML AUIJ Inject 1 Act into the skin once a week. 12 pen 3  . fluticasone (FLONASE) 50 MCG/ACT nasal spray Place 2 sprays into both nostrils daily. 48 g 3  . glucose blood (ONE TOUCH ULTRA TEST) test strip Use to test blood sugar three times a week and prn if having symptoms of low blood sugar Dx: 250.92 90 day supply 100 each 11  . glucose blood (ONETOUCH VERIO) test strip Use TID 100 each 12  . Icosapent Ethyl (VASCEPA) 1 g CAPS Take 2 capsules by mouth 2 (two) times daily. 120 capsule 11  . labetalol (NORMODYNE) 300 MG tablet TAKE ONE TABLET BY MOUTH TWICE DAILY 180 tablet 0  . linaclotide (LINZESS) 145 MCG CAPS capsule Take 1 capsule (145 mcg total) by mouth daily before breakfast. 24 capsule 0    . omeprazole (PRILOSEC) 40 MG capsule Take 1 capsule by mouth  twice daily at 8am and 10pm 180 capsule 2  . simvastatin (ZOCOR) 40 MG tablet TAKE ONE TABLET BY MOUTH AT BEDTIME 90 tablet 3  . Suvorexant (BELSOMRA) 15 MG TABS Take 1 tablet by mouth at bedtime as needed. 30 tablet 5  . triamterene-hydrochlorothiazide (DYAZIDE) 37.5-25 MG capsule TAKE ONE CAPSULE BY MOUTH ONCE DAILY 30 capsule 5  . butalbital-acetaminophen-caffeine (FIORICET, ESGIC) 50-325-40 MG tablet Take 1 tablet by mouth every 6 (six) hours as needed for headache. 20 tablet 0  . oxymetazoline (AFRIN NASAL SPRAY) 0.05 % nasal spray Place 1 spray into both nostrils 2 (two) times daily. Use only for 3days, then stop 30 mL 0  . guaiFENesin (MUCINEX) 600 MG 12 hr tablet Take 1 tablet (600 mg total) by mouth 2 (two) times daily as needed for cough or to loosen phlegm. 14 tablet 0  . INVOKANA 300 MG TABS tablet TAKE ONE TABLET BY MOUTH ONCE DAILY (Patient not taking: Reported on 07/11/2016) 30 tablet 2   No facility-administered medications prior to visit.     ROS Review of Systems  Constitutional: Negative.  Negative for appetite change, diaphoresis, fatigue and unexpected weight change.  HENT: Negative.  Negative for facial swelling and trouble  swallowing.   Eyes: Negative.   Respiratory: Negative.  Negative for cough, chest tightness, shortness of breath and wheezing.   Cardiovascular: Negative for chest pain, palpitations and leg swelling.  Gastrointestinal: Positive for constipation. Negative for abdominal pain, blood in stool, diarrhea, nausea and vomiting.  Endocrine: Negative.  Negative for cold intolerance, heat intolerance, polydipsia, polyphagia and polyuria.  Genitourinary: Negative.  Negative for decreased urine volume, difficulty urinating, dysuria, hematuria, urgency, vaginal bleeding and vaginal pain.  Musculoskeletal: Negative for back pain, myalgias and neck pain.  Skin: Negative.  Negative for color change,  pallor and rash.  Allergic/Immunologic: Negative.   Neurological: Negative.  Negative for dizziness, weakness, numbness and headaches.  Hematological: Negative for adenopathy. Does not bruise/bleed easily.  Psychiatric/Behavioral: Negative for behavioral problems, confusion, decreased concentration, dysphoric mood, sleep disturbance and suicidal ideas. The patient is nervous/anxious.     Objective:  BP 132/80 (BP Location: Left Arm, Patient Position: Sitting, Cuff Size: Large)   Pulse 86   Temp 98.4 F (36.9 C) (Oral)   Resp 16   Ht _0  (1.651 m)   Wt 186 lb 12 oz (84.7 kg)   SpO2 96%   BMI 31.08 kg/m   BP Readings from Last 3 Encounters:  07/11/16 132/80  06/06/16 120/84  05/20/16 112/78    Wt Readings from Last 3 Encounters:  07/11/16 186 lb 12 oz (84.7 kg)  06/06/16 183 lb 6.4 oz (83.2 kg)  05/20/16 184 lb (83.5 kg)    Physical Exam  Constitutional: She is oriented to person, place, and time. No distress.  HENT:  Mouth/Throat: Oropharynx is clear and moist. No oropharyngeal exudate.  Eyes: Conjunctivae are normal. Right eye exhibits no discharge. Left eye exhibits no discharge. No scleral icterus.  Neck: Normal range of motion. Neck supple. No JVD present. No tracheal deviation present. No thyromegaly present.  Cardiovascular: Normal rate, regular rhythm, normal heart sounds and intact distal pulses.  Exam reveals no gallop and no friction rub.   No murmur heard. Pulmonary/Chest: Effort normal and breath sounds normal. No stridor. No respiratory distress. She has no wheezes. She has no rales. She exhibits no tenderness.  Abdominal: Soft. Bowel sounds are normal. She exhibits no distension and no mass. There is no tenderness. There is no rebound and no guarding.  Musculoskeletal: Normal range of motion. She exhibits no edema, tenderness or deformity.  Lymphadenopathy:    She has no cervical adenopathy.  Neurological: She is oriented to person, place, and time.  Skin:  Skin is warm and dry. No rash noted. She is not diaphoretic. No erythema. No pallor.  Psychiatric: Her behavior is normal. Thought content normal.  Vitals reviewed.   Lab Results  Component Value Date   WBC 8.3 07/11/2016   HGB 10.8 (L) 07/11/2016   HCT 31.2 (L) 07/11/2016   PLT 281.0 07/11/2016   GLUCOSE 136 (H) 07/11/2016   CHOL 182 07/11/2016   TRIG (H) 07/11/2016    421.0 Triglyceride is over 400; calculations on Lipids are invalid.   HDL 36.70 (L) 07/11/2016   LDLDIRECT 78.0 07/11/2016   LDLCALC 68 07/30/2013   ALT 31 07/11/2016   AST 16 07/11/2016   NA 137 07/11/2016   K 4.0 07/11/2016   CL 101 07/11/2016   CREATININE 0.92 07/11/2016   BUN 17 07/11/2016   CO2 26 07/11/2016   TSH 2.40 10/27/2014   INR 1.04 01/14/2013   HGBA1C 6.8 (H) 07/11/2016   MICROALBUR 19.7 (H) 07/11/2016    Ct Head  Wo Contrast  Result Date: 05/31/2016 CLINICAL DATA:  H/A X 13 days, vertigo, ears ringing, drum beat in earNO HX CA, SXHX DM,HTN EXAM: CT HEAD WITHOUT CONTRAST TECHNIQUE: Contiguous axial images were obtained from the base of the skull through the vertex without intravenous contrast. COMPARISON:  10/11/2010 FINDINGS: Brain: No evidence of acute infarction, hemorrhage, hydrocephalus, extra-axial collection or mass lesion/mass effect. Vascular: No hyperdense vessel or unexpected calcification. Skull: Normal. Negative for fracture or focal lesion. Sinuses/Orbits: Globes orbits are unremarkable. Visualized sinuses and mastoid air cells are clear. Other: None. IMPRESSION: 1. Normal exam. Electronically Signed   By: Lajean Manes M.D.   On: 05/31/2016 09:04    Assessment & Plan:   Keanu was seen today for annual exam, hypertension, hyperlipidemia, diabetes and anemia.  Diagnoses and all orders for this visit:  Type 2 diabetes mellitus with complication, without long-term current use of insulin ([place])- her A1c is 6.9%, her blood sugars are adequately well controlled. We'll continue to GLP-1  agonist. -     Microalbumin / creatinine urine ratio; Future -     Hemoglobin A1c; Future  Routine general medical examination at a health care facility- exam completed, she refused a flu vaccine, her colonoscopy is up-to-date, her Pap and mammogram are up-to-date, patient education material was given. -     Lipid panel; Future -     Comprehensive metabolic panel; Future -     CBC with Differential/Platelet; Future -     Urinalysis, Routine w reflex microscopic; Future  Deficiency anemia- she has a chronic, stable, normochromic normocytic anemia with normal reticulocyte count and vitamin levels. This is consistent with the anemia of chronic disease. Will continue to follow this. I will also screen for zinc, B6, and B1 defic. 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   I have discontinued Ms. Cronic's INVOKANA, oxymetazoline, guaiFENesin, and butalbital-acetaminophen-caffeine. I am also having her start on clonazePAM. Additionally, I am having her maintain her aspirin, glucose blood, cetirizine, Calcium Carbonate-Vit D-Min (CALCIUM 1200 PO), glucose blood, ONETOUCH VERIO IQ SYSTEM, Icosapent Ethyl, Suvorexant, cloNIDine, Azilsartan Medoxomil, triamterene-hydrochlorothiazide, omeprazole, simvastatin, fluticasone, Exenatide ER, labetalol, and linaclotide.  Meds ordered this encounter  Medications  . clonazePAM (KLONOPIN) 0.5 MG tablet    Sig: Take 1 tablet (0.5 mg total) by mouth 2 (two) times daily as needed for anxiety.    Dispense:  20 tablet    Refill:  2     Follow-up: Return in about 4 months (around 11/10/2016).  Scarlette Calico, MD

## 2016-07-11 NOTE — Progress Notes (Signed)
Pre visit review using our clinic review tool, if applicable. No additional management support is needed unless otherwise documented below in the visit note. 

## 2016-07-16 ENCOUNTER — Other Ambulatory Visit: Payer: Self-pay

## 2016-08-04 ENCOUNTER — Other Ambulatory Visit: Payer: Self-pay | Admitting: Internal Medicine

## 2016-08-20 ENCOUNTER — Other Ambulatory Visit: Payer: Self-pay | Admitting: Internal Medicine

## 2016-08-28 ENCOUNTER — Telehealth: Payer: Self-pay | Admitting: Internal Medicine

## 2016-08-28 NOTE — Telephone Encounter (Signed)
Pt informed of MD response. Pt will be back in town tomorrow. Pt will call back to schedule appt.

## 2016-08-28 NOTE — Telephone Encounter (Signed)
This is unusual, and could represent a small infection, please ask pt to make ROV to check

## 2016-08-28 NOTE — Telephone Encounter (Signed)
Pt called and said that she did her insulin shot 2 weeks ago in her stomach and there is still a knot there. She did on last Friday in her thigh and it is still very sore and her thigh hurts to touch and is a little swollen. She is due for one tomorrow but is concerned about it. Please advise.

## 2016-09-02 NOTE — Telephone Encounter (Signed)
Pt called to make an appointment with Dr Ronnald Ramp. He is completely full today. Would he work her in today or what would you recommend?

## 2016-09-04 NOTE — Telephone Encounter (Signed)
Left patient vm to call back to schedule appointment to be seen.

## 2016-09-06 ENCOUNTER — Other Ambulatory Visit: Payer: Self-pay | Admitting: Cardiovascular Disease

## 2016-09-12 ENCOUNTER — Encounter: Payer: Self-pay | Admitting: Internal Medicine

## 2016-09-12 ENCOUNTER — Other Ambulatory Visit: Payer: 59

## 2016-09-12 ENCOUNTER — Ambulatory Visit (INDEPENDENT_AMBULATORY_CARE_PROVIDER_SITE_OTHER): Payer: 59 | Admitting: Internal Medicine

## 2016-09-12 VITALS — BP 112/72 | HR 80 | Temp 98.8°F | Resp 16 | Ht 65.0 in | Wt 187.0 lb

## 2016-09-12 DIAGNOSIS — D6489 Other specified anemias: Secondary | ICD-10-CM

## 2016-09-12 DIAGNOSIS — K5901 Slow transit constipation: Secondary | ICD-10-CM

## 2016-09-12 DIAGNOSIS — E118 Type 2 diabetes mellitus with unspecified complications: Secondary | ICD-10-CM

## 2016-09-12 DIAGNOSIS — Z794 Long term (current) use of insulin: Secondary | ICD-10-CM

## 2016-09-12 DIAGNOSIS — I10 Essential (primary) hypertension: Secondary | ICD-10-CM | POA: Diagnosis not present

## 2016-09-12 DIAGNOSIS — E669 Obesity, unspecified: Secondary | ICD-10-CM

## 2016-09-12 MED ORDER — LORCASERIN HCL ER 20 MG PO TB24: 1.0000 | ORAL_TABLET | Freq: Every day | ORAL | 5 refills | Status: DC

## 2016-09-12 MED ORDER — LINACLOTIDE 290 MCG PO CAPS: 290.0000 ug | ORAL_CAPSULE | Freq: Every day | ORAL | 1 refills | Status: DC

## 2016-09-12 MED ORDER — LIRAGLUTIDE 18 MG/3ML ~~LOC~~ SOPN: 1.8000 mg | PEN_INJECTOR | Freq: Every day | SUBCUTANEOUS | 1 refills | Status: DC

## 2016-09-12 MED ORDER — INSULIN PEN NEEDLE 32G X 6 MM MISC: 1.0000 | Freq: Every day | 3 refills | Status: DC

## 2016-09-12 NOTE — Patient Instructions (Signed)

## 2016-09-12 NOTE — Progress Notes (Signed)
Subjective:  Patient ID: Carolyn [name], female    DOB: [birth year]  Age: 56 y.o. MRN: [id number]  CC: Anemia and Diabetes   HPI Carolyn [name] presents for f/up - She has chronic anemia with no signs of blood loss and recently had a normal B12 and iron level. She complains that she gets lumps under her skin at the sites where she injects Bydureon, she wants to change to a different GLP1 agonist. She is also concerned about her inability to lose weight and wants to try an appetite suppressant in addition to lifestyle modifications. She also complains of chronic constipation and tells me the current dose of Linzess is not very effective, she wants to try higher dose. She offers no other complaints today.  Outpatient Medications Prior to Visit  Medication Sig Dispense Refill  . aspirin EC 81 MG EC tablet Take 1 tablet (81 mg total) by mouth daily.    . Azilsartan Medoxomil (EDARBI) 80 MG TABS Take 1 tablet (80 mg total) by mouth daily. 90 tablet 1  . Blood Glucose Monitoring Suppl (ONETOUCH VERIO IQ SYSTEM) W/DEVICE KIT 1 Act by Does not apply route 3 (three) times daily. 2 kit 0  . cetirizine (ZYRTEC) 10 MG chewable tablet Chew 10 mg by mouth daily.    . clonazePAM (KLONOPIN) 0.5 MG tablet Take 1 tablet (0.5 mg total) by mouth 2 (two) times daily as needed for anxiety. 20 tablet 2  . cloNIDine (CATAPRES) 0.1 MG tablet TAKE TWO TABLETS BY MOUTH ONCE DAILY 60 tablet 5  . fluticasone (FLONASE) 50 MCG/ACT nasal spray Place 2 sprays into both nostrils daily. 48 g 3  . glucose blood (ONE TOUCH ULTRA TEST) test strip Use to test blood sugar three times a week and prn if having symptoms of low blood sugar Dx: 250.92 90 day supply 100 each 11  . glucose blood (ONETOUCH VERIO) test strip Use TID 100 each 12  . Icosapent Ethyl (VASCEPA) 1 g CAPS Take 2 capsules by mouth 2 (two) times daily. 120 capsule 11  . labetalol (NORMODYNE) 300 MG tablet TAKE 1 TABLET BY MOUTH TWICE DAILY 180 tablet 2  .  omeprazole (PRILOSEC) 40 MG capsule Take 1 capsule by mouth  twice daily at 8am and 10pm 180 capsule 2  . simvastatin (ZOCOR) 40 MG tablet TAKE ONE TABLET BY MOUTH AT BEDTIME 90 tablet 3  . Suvorexant (BELSOMRA) 15 MG TABS Take 1 tablet by mouth at bedtime as needed. 30 tablet 5  . triamterene-hydrochlorothiazide (DYAZIDE) 37.5-25 MG capsule TAKE ONE CAPSULE BY MOUTH ONCE DAILY 30 capsule 5  . Calcium Carbonate-Vit D-Min (CALCIUM 1200 PO) Take 1 tablet by mouth daily.    . cloNIDine (CATAPRES) 0.1 MG tablet Take 2 tablets (0.2 mg total) by mouth daily. 180 tablet 1  . Exenatide ER (BYDUREON BCISE) 2 MG/0.85ML AUIJ Inject 1 Act into the skin once a week. 12 pen 3  . linaclotide (LINZESS) 145 MCG CAPS capsule Take 1 capsule (145 mcg total) by mouth daily before breakfast. 24 capsule 0   No facility-administered medications prior to visit.     ROS Review of Systems  Constitutional: Negative.  Negative for diaphoresis and fatigue.  HENT: Negative.   Eyes: Negative for visual disturbance.  Respiratory: Negative for cough, chest tightness, shortness of breath and wheezing.   Cardiovascular: Negative for chest pain, palpitations and leg swelling.  Gastrointestinal: Negative for abdominal pain, anal bleeding, blood in stool, constipation, diarrhea, nausea and vomiting.  Endocrine:  Negative.  Negative for polydipsia, polyphagia and polyuria.  Genitourinary: Negative.  Negative for difficulty urinating, hematuria and vaginal bleeding.  Musculoskeletal: Negative.  Negative for arthralgias and myalgias.  Skin: Negative.  Negative for color change.  Allergic/Immunologic: Negative.   Neurological: Negative for dizziness and numbness.  Hematological: Negative for adenopathy. Does not bruise/bleed easily.  Psychiatric/Behavioral: Negative.     Objective:  BP 112/72 (BP Location: Left Arm, Patient Position: Sitting, Cuff Size: Normal)   Pulse 80   Temp 98.8 F (37.1 C) (Oral)   Resp 16   Ht '5\' 5"'   (1.651 m)   Wt 187 lb (84.8 kg)   SpO2 100%   BMI 31.12 kg/m   BP Readings from Last 3 Encounters:  09/12/16 112/72  07/11/16 132/80  06/06/16 120/84    Wt Readings from Last 3 Encounters:  09/12/16 187 lb (84.8 kg)  07/11/16 186 lb 12 oz (84.7 kg)  06/06/16 183 lb 6.4 oz (83.2 kg)    Physical Exam  Constitutional: She is oriented to person, place, and time. No distress.  HENT:  Mouth/Throat: Oropharynx is clear and moist. No oropharyngeal exudate.  Eyes: Conjunctivae are normal. Right eye exhibits no discharge. Left eye exhibits no discharge. No scleral icterus.  Neck: Normal range of motion. Neck supple. No thyromegaly present.  Cardiovascular: Normal rate, regular rhythm, normal heart sounds and intact distal pulses.  Exam reveals no gallop and no friction rub.   No murmur heard. Pulmonary/Chest: Effort normal and breath sounds normal. No respiratory distress. She has no wheezes. She has no rales. She exhibits no tenderness.  Abdominal: Soft. Bowel sounds are normal. She exhibits no distension and no mass. There is no tenderness. There is no rebound and no guarding.  Musculoskeletal: Normal range of motion. She exhibits no edema, tenderness or deformity.  Neurological: She is oriented to person, place, and time.  Skin: Skin is warm and dry. No rash noted. She is not diaphoretic. No erythema. No pallor.  Vitals reviewed.   Lab Results  Component Value Date   WBC 7.7 09/13/2016   HGB 10.5 (L) 09/13/2016   HGB 10.1 (L) 09/13/2016   HCT 29.9 (L) 09/13/2016   HCT 30.2 (L) 09/13/2016   PLT 287.0 09/13/2016   GLUCOSE 148 (H) 09/13/2016   CHOL 182 07/11/2016   TRIG (H) 07/11/2016    421.0 Triglyceride is over 400; calculations on Lipids are invalid.   HDL 36.70 (L) 07/11/2016   LDLDIRECT 78.0 07/11/2016   LDLCALC 68 07/30/2013   ALT 31 07/11/2016   AST 16 07/11/2016   NA 137 09/13/2016   K 4.0 09/13/2016   CL 101 09/13/2016   CREATININE 1.03 09/13/2016   BUN 24 (H)  09/13/2016   CO2 27 09/13/2016   TSH 2.40 10/27/2014   INR 1.04 01/14/2013   HGBA1C 6.8 (H) 07/11/2016   MICROALBUR 19.7 (H) 07/11/2016    Ct Head Wo Contrast  Result Date: 05/31/2016 CLINICAL DATA:  H/A X 13 days, vertigo, ears ringing, drum beat in earNO HX CA, SXHX DM,HTN EXAM: CT HEAD WITHOUT CONTRAST TECHNIQUE: Contiguous axial images were obtained from the base of the skull through the vertex without intravenous contrast. COMPARISON:  10/11/2010 FINDINGS: Brain: No evidence of acute infarction, hemorrhage, hydrocephalus, extra-axial collection or mass lesion/mass effect. Vascular: No hyperdense vessel or unexpected calcification. Skull: Normal. Negative for fracture or focal lesion. Sinuses/Orbits: Globes orbits are unremarkable. Visualized sinuses and mastoid air cells are clear. Other: None. IMPRESSION: 1. Normal exam. Electronically Signed  By: Lajean Manes M.D.   On: 05/31/2016 09:04    Assessment & Plan:   Shalicia was seen today for anemia and diabetes.  Diagnoses and all orders for this visit:  Anemia due to other cause, not classified- her H&H are stable, will screen for hemolysis, vitamin B1 deficiency, and hemoglobinopathy. -     CBC with Differential/Platelet; Future -     Vitamin B1; Future -     Haptoglobin; Future -     Hemoglobinopathy evaluation; Future -     Lactate dehydrogenase; Future -     Hemoglobinopathy Evaluation -     Hemoglobinopathy Evaluation; Future  Slow transit constipation- we'll try higher dose -     linaclotide (LINZESS) 290 MCG CAPS capsule; Take 1 capsule (290 mcg total) by mouth daily before breakfast.  Essential hypertension- her blood pressure is adequately well controlled, electrolytes and renal function are normal. -     Basic metabolic panel; Future  Type 2 diabetes mellitus with complication, with long-term current use of insulin ([place])- we'll change to a daily dose of Victoza as I anticipate that this will not cause a sensation of  lumps under her skin. -     liraglutide (VICTOZA) 18 MG/3ML SOPN; Inject 0.3 mLs (1.8 mg total) into the skin daily. -     Insulin Pen Needle (NOVOFINE) 32G X 6 MM MISC; 1 Act by Does not apply route daily.  Obesity (BMI 30-39.9) -     Lorcaserin HCl ER (BELVIQ XR) 20 MG TB24; Take 1 tablet by mouth daily.   I have discontinued Ms. Hartgrove's Calcium Carbonate-Vit D-Min (CALCIUM 1200 PO), Exenatide ER, and linaclotide. I am also having her start on linaclotide, liraglutide, Lorcaserin HCl ER, and Insulin Pen Needle. Additionally, I am having her maintain her aspirin, glucose blood, cetirizine, glucose blood, ONETOUCH VERIO IQ SYSTEM, Icosapent Ethyl, Suvorexant, Azilsartan Medoxomil, omeprazole, simvastatin, fluticasone, clonazePAM, cloNIDine, triamterene-hydrochlorothiazide, and labetalol.  Meds ordered this encounter  Medications  . linaclotide (LINZESS) 290 MCG CAPS capsule    Sig: Take 1 capsule (290 mcg total) by mouth daily before breakfast.    Dispense:  90 capsule    Refill:  1  . liraglutide (VICTOZA) 18 MG/3ML SOPN    Sig: Inject 0.3 mLs (1.8 mg total) into the skin daily.    Dispense:  27 mL    Refill:  1  . Lorcaserin HCl ER (BELVIQ XR) 20 MG TB24    Sig: Take 1 tablet by mouth daily.    Dispense:  30 tablet    Refill:  5  . Insulin Pen Needle (NOVOFINE) 32G X 6 MM MISC    Sig: 1 Act by Does not apply route daily.    Dispense:  100 each    Refill:  3     Follow-up: Return in about 4 months (around 01/12/2017).  Scarlette Calico, MD

## 2016-09-13 ENCOUNTER — Other Ambulatory Visit (INDEPENDENT_AMBULATORY_CARE_PROVIDER_SITE_OTHER): Payer: 59

## 2016-09-13 DIAGNOSIS — I10 Essential (primary) hypertension: Secondary | ICD-10-CM

## 2016-09-13 DIAGNOSIS — D6489 Other specified anemias: Secondary | ICD-10-CM

## 2016-09-13 LAB — CBC WITH DIFFERENTIAL/PLATELET
BASOS PCT: 0.9 % (ref 0.0–3.0)
Basophils Absolute: 0.1 10*3/uL (ref 0.0–0.1)
EOS ABS: 0.2 10*3/uL (ref 0.0–0.7)
Eosinophils Relative: 2.4 % (ref 0.0–5.0)
HEMATOCRIT: 29.9 % — AB (ref 36.0–46.0)
Hemoglobin: 10.5 g/dL — ABNORMAL LOW (ref 12.0–15.0)
LYMPHS ABS: 2.6 10*3/uL (ref 0.7–4.0)
LYMPHS PCT: 33.7 % (ref 12.0–46.0)
MCHC: 35 g/dL (ref 30.0–36.0)
MCV: 87.2 fl (ref 78.0–100.0)
Monocytes Absolute: 0.4 10*3/uL (ref 0.1–1.0)
Monocytes Relative: 5.1 % (ref 3.0–12.0)
NEUTROS ABS: 4.4 10*3/uL (ref 1.4–7.7)
NEUTROS PCT: 57.9 % (ref 43.0–77.0)
PLATELETS: 287 10*3/uL (ref 150.0–400.0)
RBC: 3.43 Mil/uL — ABNORMAL LOW (ref 3.87–5.11)
RDW: 13.1 % (ref 11.5–15.5)
WBC: 7.7 10*3/uL (ref 4.0–10.5)

## 2016-09-13 LAB — BASIC METABOLIC PANEL
BUN: 24 mg/dL — AB (ref 6–23)
CALCIUM: 9.5 mg/dL (ref 8.4–10.5)
CHLORIDE: 101 meq/L (ref 96–112)
CO2: 27 mEq/L (ref 19–32)
CREATININE: 1.03 mg/dL (ref 0.40–1.20)
GFR: 71.66 mL/min (ref >60.00)
Glucose, Bld: 148 mg/dL — ABNORMAL HIGH (ref 70–99)
Potassium: 4 mEq/L (ref 3.5–5.1)
Sodium: 137 mEq/L (ref 135–145)

## 2016-09-13 LAB — LACTATE DEHYDROGENASE: LDH: 106 U/L — ABNORMAL LOW (ref 120–250)

## 2016-09-16 ENCOUNTER — Telehealth: Payer: Self-pay | Admitting: Internal Medicine

## 2016-09-16 DIAGNOSIS — Z794 Long term (current) use of insulin: Principal | ICD-10-CM

## 2016-09-16 DIAGNOSIS — E118 Type 2 diabetes mellitus with unspecified complications: Secondary | ICD-10-CM

## 2016-09-16 LAB — HEMOGLOBINOPATHY EVALUATION
HEMATOCRIT: 30.2 % — AB (ref 35.0–45.0)
Hemoglobin: 10.1 g/dL — ABNORMAL LOW (ref 11.7–15.5)
Hgb A2 Quant: 1.9 % (ref 1.8–3.5)
Hgb A: 97.1 % (ref >96.0)
Hgb F Quant: <1 % (ref <2.0)
MCH: 29 pg (ref 27.0–33.0)
MCV: 86.8 fL (ref 80.0–100.0)
RBC: 3.48 MIL/uL — ABNORMAL LOW (ref 3.80–5.10)
RDW: 13.5 % (ref 11.0–15.0)

## 2016-09-16 LAB — HAPTOGLOBIN: HAPTOGLOBIN: 119 mg/dL (ref 43–212)

## 2016-09-16 NOTE — Telephone Encounter (Signed)
Pt would like a call with her results from 6/1

## 2016-09-17 ENCOUNTER — Encounter: Payer: Self-pay | Admitting: Internal Medicine

## 2016-09-17 ENCOUNTER — Telehealth: Payer: Self-pay

## 2016-09-17 MED ORDER — LIRAGLUTIDE 18 MG/3ML ~~LOC~~ SOPN: 1.8000 mg | PEN_INJECTOR | Freq: Every day | SUBCUTANEOUS | 1 refills | Status: DC

## 2016-09-17 NOTE — Telephone Encounter (Signed)
Key: [id number]

## 2016-09-17 NOTE — Telephone Encounter (Signed)
Contacted pt.   Resent the victoza to [place]. Pt stated that it may be cheaper. Mailed pt copay card for belviq xr. Informed pt that [name] will send a message with results from the labs.

## 2016-09-18 ENCOUNTER — Encounter: Payer: Self-pay | Admitting: Internal Medicine

## 2016-09-18 ENCOUNTER — Other Ambulatory Visit: Payer: Self-pay | Admitting: Internal Medicine

## 2016-09-18 DIAGNOSIS — E519 Thiamine deficiency, unspecified: Secondary | ICD-10-CM | POA: Insufficient documentation

## 2016-09-18 DIAGNOSIS — D538 Other specified nutritional anemias: Secondary | ICD-10-CM | POA: Insufficient documentation

## 2016-09-18 DIAGNOSIS — D649 Anemia, unspecified: Principal | ICD-10-CM

## 2016-09-18 LAB — VITAMIN B1: Vitamin B1 (Thiamine): 7 nmol/L — ABNORMAL LOW (ref 8–30)

## 2016-09-18 MED ORDER — VITAMIN B-1 100 MG PO TABS: 100.0000 mg | ORAL_TABLET | Freq: Every day | ORAL | 3 refills | Status: DC

## 2016-09-18 NOTE — Telephone Encounter (Signed)
Stopped PA process, pt has a copay card.

## 2016-09-19 ENCOUNTER — Other Ambulatory Visit: Payer: Self-pay | Admitting: Internal Medicine

## 2016-09-19 DIAGNOSIS — E118 Type 2 diabetes mellitus with unspecified complications: Secondary | ICD-10-CM

## 2016-09-19 DIAGNOSIS — Z794 Long term (current) use of insulin: Principal | ICD-10-CM

## 2016-09-19 MED ORDER — SEMAGLUTIDE (1 MG/DOSE) 2 MG/1.5ML ~~LOC~~ SOPN: 1.0000 | PEN_INJECTOR | SUBCUTANEOUS | 11 refills | Status: DC

## 2016-09-20 ENCOUNTER — Telehealth: Payer: Self-pay | Admitting: Internal Medicine

## 2016-09-20 NOTE — Telephone Encounter (Signed)
Pt called and states she found her discount card

## 2016-09-26 ENCOUNTER — Encounter: Payer: Self-pay | Admitting: Internal Medicine

## 2016-09-26 DIAGNOSIS — E519 Thiamine deficiency, unspecified: Secondary | ICD-10-CM

## 2016-09-26 DIAGNOSIS — D649 Anemia, unspecified: Principal | ICD-10-CM

## 2016-09-27 MED ORDER — VITAMIN B-1 100 MG PO TABS: 100.0000 mg | ORAL_TABLET | Freq: Every day | ORAL | 3 refills | Status: DC

## 2016-10-11 ENCOUNTER — Other Ambulatory Visit: Payer: Self-pay | Admitting: Internal Medicine

## 2016-10-11 NOTE — Telephone Encounter (Signed)
rx rq for losarton.   Refills was refused due to change in therapy.  Note to pharmacy stating same.   Forwarding to PCP as Juluis Rainier

## 2016-10-14 ENCOUNTER — Telehealth: Payer: Self-pay | Admitting: Internal Medicine

## 2016-10-14 ENCOUNTER — Other Ambulatory Visit: Payer: Self-pay | Admitting: Internal Medicine

## 2016-10-14 DIAGNOSIS — E118 Type 2 diabetes mellitus with unspecified complications: Secondary | ICD-10-CM

## 2016-10-14 DIAGNOSIS — I10 Essential (primary) hypertension: Secondary | ICD-10-CM

## 2016-10-14 MED ORDER — TELMISARTAN 80 MG PO TABS: 80.0000 mg | ORAL_TABLET | Freq: Every day | ORAL | 1 refills | Status: DC

## 2016-10-14 NOTE — Telephone Encounter (Signed)
Pt is asking to go back on losartan (COZAAR) 100 MG tablet or something else that is not as expensive as the [place]. She is completely out.

## 2016-10-14 NOTE — Telephone Encounter (Signed)
Will change to telmisartan It is a generic so less expensive than Edarbi but more effective than losartan

## 2016-10-14 NOTE — Telephone Encounter (Signed)
Pt informed that erx was sent. Pt stated that she has picked it up and it was only $20.00.

## 2016-11-01 ENCOUNTER — Encounter: Payer: Self-pay | Admitting: Internal Medicine

## 2016-11-05 ENCOUNTER — Other Ambulatory Visit: Payer: Self-pay | Admitting: Internal Medicine

## 2016-11-05 DIAGNOSIS — E118 Type 2 diabetes mellitus with unspecified complications: Secondary | ICD-10-CM

## 2016-11-05 MED ORDER — LIRAGLUTIDE 18 MG/3ML ~~LOC~~ SOPN: 1.8000 mg | PEN_INJECTOR | Freq: Every day | SUBCUTANEOUS | 1 refills | Status: DC

## 2016-11-24 ENCOUNTER — Other Ambulatory Visit: Payer: Self-pay | Admitting: Internal Medicine

## 2016-11-24 DIAGNOSIS — E781 Pure hyperglyceridemia: Secondary | ICD-10-CM

## 2017-01-08 ENCOUNTER — Ambulatory Visit (INDEPENDENT_AMBULATORY_CARE_PROVIDER_SITE_OTHER): Payer: 59 | Admitting: Internal Medicine

## 2017-01-08 ENCOUNTER — Encounter: Payer: Self-pay | Admitting: Internal Medicine

## 2017-01-08 ENCOUNTER — Other Ambulatory Visit (INDEPENDENT_AMBULATORY_CARE_PROVIDER_SITE_OTHER): Payer: 59

## 2017-01-08 VITALS — BP 130/80 | HR 73 | Temp 98.3°F | Resp 16 | Ht 65.0 in | Wt 184.5 lb

## 2017-01-08 DIAGNOSIS — E519 Thiamine deficiency, unspecified: Secondary | ICD-10-CM | POA: Diagnosis not present

## 2017-01-08 DIAGNOSIS — E781 Pure hyperglyceridemia: Secondary | ICD-10-CM

## 2017-01-08 DIAGNOSIS — I10 Essential (primary) hypertension: Secondary | ICD-10-CM

## 2017-01-08 DIAGNOSIS — E118 Type 2 diabetes mellitus with unspecified complications: Secondary | ICD-10-CM | POA: Diagnosis not present

## 2017-01-08 DIAGNOSIS — D649 Anemia, unspecified: Secondary | ICD-10-CM

## 2017-01-08 DIAGNOSIS — D538 Other specified nutritional anemias: Secondary | ICD-10-CM

## 2017-01-08 LAB — URINALYSIS, ROUTINE W REFLEX MICROSCOPIC
Bilirubin Urine: NEGATIVE
Hgb urine dipstick: NEGATIVE
Ketones, ur: NEGATIVE
Leukocytes, UA: NEGATIVE
Nitrite: NEGATIVE
PH: 6.5 (ref 5.0–8.0)
RBC / HPF: NONE SEEN (ref 0–?)
SPECIFIC GRAVITY, URINE: 1.01 (ref 1.000–1.030)
Urine Glucose: NEGATIVE
Urobilinogen, UA: 0.2 (ref 0.0–1.0)
WBC, UA: NONE SEEN (ref 0–?)

## 2017-01-08 LAB — CBC WITH DIFFERENTIAL/PLATELET
BASOS ABS: 0.1 10*3/uL (ref 0.0–0.1)
Basophils Relative: 1 % (ref 0.0–3.0)
EOS PCT: 2 % (ref 0.0–5.0)
Eosinophils Absolute: 0.2 10*3/uL (ref 0.0–0.7)
HEMATOCRIT: 31 % — AB (ref 36.0–46.0)
Hemoglobin: 10.6 g/dL — ABNORMAL LOW (ref 12.0–15.0)
LYMPHS ABS: 2.7 10*3/uL (ref 0.7–4.0)
LYMPHS PCT: 32.8 % (ref 12.0–46.0)
MCHC: 34.1 g/dL (ref 30.0–36.0)
MCV: 87.9 fl (ref 78.0–100.0)
MONOS PCT: 6.1 % (ref 3.0–12.0)
Monocytes Absolute: 0.5 10*3/uL (ref 0.1–1.0)
NEUTROS ABS: 4.8 10*3/uL (ref 1.4–7.7)
NEUTROS PCT: 58.1 % (ref 43.0–77.0)
Platelets: 309 10*3/uL (ref 150.0–400.0)
RBC: 3.53 Mil/uL — AB (ref 3.87–5.11)
RDW: 13.2 % (ref 11.5–15.5)
WBC: 8.3 10*3/uL (ref 4.0–10.5)

## 2017-01-08 LAB — COMPREHENSIVE METABOLIC PANEL
ALBUMIN: 4.8 g/dL (ref 3.5–5.2)
ALK PHOS: 66 U/L (ref 39–117)
ALT: 18 U/L (ref 0–35)
AST: 12 U/L (ref 0–37)
BUN: 13 mg/dL (ref 6–23)
CO2: 29 mEq/L (ref 19–32)
CREATININE: 0.91 mg/dL (ref 0.40–1.20)
Calcium: 10 mg/dL (ref 8.4–10.5)
Chloride: 99 mEq/L (ref 96–112)
GFR: 82.57 mL/min (ref >60.00)
Glucose, Bld: 100 mg/dL — ABNORMAL HIGH (ref 70–99)
POTASSIUM: 3.7 meq/L (ref 3.5–5.1)
SODIUM: 136 meq/L (ref 135–145)
TOTAL PROTEIN: 8 g/dL (ref 6.0–8.3)
Total Bilirubin: 0.5 mg/dL (ref 0.2–1.2)

## 2017-01-08 LAB — TRIGLYCERIDES: Triglycerides: 254 mg/dL — ABNORMAL HIGH (ref 0.0–149.0)

## 2017-01-08 LAB — HEMOGLOBIN A1C: HEMOGLOBIN A1C: 6.2 % (ref 4.6–6.5)

## 2017-01-08 NOTE — Patient Instructions (Signed)

## 2017-01-08 NOTE — Progress Notes (Signed)
 Subjective:  Patient ID: Carolyn [name], female    DOB: 07/29/1960  Age: 56 y.o. MRN: [id number]  CC: Anemia; Hypertension; and Diabetes   HPI Bular H Madarang presents for f/up - She complains of a several week history of polyuria, polydipsia, dizziness, dry mouth, and loss of appetite. She denies abdominal pain, nausea, or vomiting.  Outpatient Medications Prior to Visit  Medication Sig Dispense Refill  . aspirin EC 81 MG EC tablet Take 1 tablet (81 mg total) by mouth daily.    . Blood Glucose Monitoring Suppl (ONETOUCH VERIO IQ SYSTEM) W/DEVICE KIT 1 Act by Does not apply route 3 (three) times daily. 2 kit 0  . cetirizine (ZYRTEC) 10 MG chewable tablet Chew 10 mg by mouth daily.    . clonazePAM (KLONOPIN) 0.5 MG tablet Take 1 tablet (0.5 mg total) by mouth 2 (two) times daily as needed for anxiety. 20 tablet 2  . cloNIDine (CATAPRES) 0.1 MG tablet TAKE TWO TABLETS BY MOUTH ONCE DAILY 60 tablet 5  . fluticasone (FLONASE) 50 MCG/ACT nasal spray Place 2 sprays into both nostrils daily. 48 g 3  . glucose blood (ONE TOUCH ULTRA TEST) test strip Use to test blood sugar three times a week and prn if having symptoms of low blood sugar Dx: 250.92 90 day supply 100 each 11  . glucose blood (ONETOUCH VERIO) test strip Use TID 100 each 12  . Insulin Pen Needle (NOVOFINE) 32G X 6 MM MISC 1 Act by Does not apply route daily. 100 each 3  . labetalol (NORMODYNE) 300 MG tablet TAKE 1 TABLET BY MOUTH TWICE DAILY 180 tablet 2  . linaclotide (LINZESS) 290 MCG CAPS capsule Take 1 capsule (290 mcg total) by mouth daily before breakfast. 90 capsule 1  . liraglutide (VICTOZA) 18 MG/3ML SOPN Inject 0.3 mLs (1.8 mg total) into the skin daily. 9 mL 1  . Lorcaserin HCl ER (BELVIQ XR) 20 MG TB24 Take 1 tablet by mouth daily. 30 tablet 5  . omeprazole (PRILOSEC) 40 MG capsule Take 1 capsule by mouth  twice daily at 8am and 10pm 180 capsule 2  . simvastatin (ZOCOR) 40 MG tablet TAKE ONE TABLET BY MOUTH AT BEDTIME 90  tablet 3  . Suvorexant (BELSOMRA) 15 MG TABS Take 1 tablet by mouth at bedtime as needed. 30 tablet 5  . telmisartan (MICARDIS) 80 MG tablet Take 1 tablet (80 mg total) by mouth daily. 90 tablet 1  . thiamine (VITAMIN B-1) 100 MG tablet Take 1 tablet (100 mg total) by mouth daily. 90 tablet 3  . triamterene-hydrochlorothiazide (DYAZIDE) 37.5-25 MG capsule TAKE ONE CAPSULE BY MOUTH ONCE DAILY 30 capsule 5  . VASCEPA 1 g CAPS TAKE TWO CAPSULES BY MOUTH TWICE DAILY. 120 capsule 5   No facility-administered medications prior to visit.     ROS Review of Systems  Constitutional: Positive for appetite change. Negative for activity change, diaphoresis, fatigue and unexpected weight change.  HENT: Negative.  Negative for trouble swallowing.   Eyes: Negative.  Negative for visual disturbance.  Respiratory: Negative.  Negative for cough, chest tightness, shortness of breath and wheezing.   Cardiovascular: Negative for chest pain, palpitations and leg swelling.  Gastrointestinal: Negative for abdominal pain, constipation, diarrhea, nausea and vomiting.  Endocrine: Positive for polydipsia and polyuria. Negative for polyphagia.  Genitourinary: Negative.  Negative for decreased urine volume, difficulty urinating, dysuria, frequency and urgency.  Musculoskeletal: Negative.  Negative for back pain and myalgias.  Skin: Negative.   Allergic/Immunologic: Negative.     Neurological: Positive for dizziness. Negative for headaches.  Hematological: Negative for adenopathy. Does not bruise/bleed easily.  Psychiatric/Behavioral: Negative.     Objective:  BP 130/80 (BP Location: Left Arm, Patient Position: Sitting, Cuff Size: Large)   Pulse 73   Temp 98.3 F (36.8 C) (Oral)   Resp 16   Ht 5' 5" (1.651 m)   Wt 184 lb 8 oz (83.7 kg)   SpO2 99%   BMI 30.70 kg/m   BP Readings from Last 3 Encounters:  01/08/17 130/80  09/12/16 112/72  07/11/16 132/80    Wt Readings from Last 3 Encounters:  01/08/17 184  lb 8 oz (83.7 kg)  09/12/16 187 lb (84.8 kg)  07/11/16 186 lb 12 oz (84.7 kg)    Physical Exam  Constitutional: She is oriented to person, place, and time. No distress.  HENT:  Mouth/Throat: Oropharynx is clear and moist. No oropharyngeal exudate.  Eyes: Conjunctivae are normal. Right eye exhibits no discharge. Left eye exhibits no discharge. No scleral icterus.  Neck: Normal range of motion. Neck supple. No tracheal deviation present. No thyromegaly present.  Cardiovascular: Normal rate, regular rhythm and intact distal pulses.  Exam reveals no gallop and no friction rub.   No murmur heard. Pulmonary/Chest: Effort normal and breath sounds normal. No respiratory distress. She has no wheezes. She has no rales. She exhibits no tenderness.  Abdominal: Soft. Bowel sounds are normal. She exhibits no distension and no mass. There is no tenderness. There is no rebound and no guarding.  Musculoskeletal: Normal range of motion. She exhibits no edema, tenderness or deformity.  Lymphadenopathy:    She has no cervical adenopathy.  Neurological: She is alert and oriented to person, place, and time.  Skin: Skin is warm and dry. No rash noted. She is not diaphoretic. No erythema. No pallor.  Vitals reviewed.   Lab Results  Component Value Date   WBC 8.3 01/08/2017   HGB 10.6 (L) 01/08/2017   HCT 31.0 (L) 01/08/2017   PLT 309.0 01/08/2017   GLUCOSE 100 (H) 01/08/2017   CHOL 182 07/11/2016   TRIG 254.0 (H) 01/08/2017   HDL 36.70 (L) 07/11/2016   LDLDIRECT 78.0 07/11/2016   LDLCALC 68 07/30/2013   ALT 18 01/08/2017   AST 12 01/08/2017   NA 136 01/08/2017   K 3.7 01/08/2017   CL 99 01/08/2017   CREATININE 0.91 01/08/2017   BUN 13 01/08/2017   CO2 29 01/08/2017   TSH 2.40 10/27/2014   INR 1.04 01/14/2013   HGBA1C 6.2 01/08/2017   MICROALBUR 19.7 (H) 07/11/2016    Ct Head Wo Contrast  Result Date: 05/31/2016 CLINICAL DATA:  H/A X 13 days, vertigo, ears ringing, drum beat in earNO HX CA,  SXHX DM,HTN EXAM: CT HEAD WITHOUT CONTRAST TECHNIQUE: Contiguous axial images were obtained from the base of the skull through the vertex without intravenous contrast. COMPARISON:  10/11/2010 FINDINGS: Brain: No evidence of acute infarction, hemorrhage, hydrocephalus, extra-axial collection or mass lesion/mass effect. Vascular: No hyperdense vessel or unexpected calcification. Skull: Normal. Negative for fracture or focal lesion. Sinuses/Orbits: Globes orbits are unremarkable. Visualized sinuses and mastoid air cells are clear. Other: None. IMPRESSION: 1. Normal exam. Electronically Signed   By: David  Ormond M.D.   On: 05/31/2016 09:04    Assessment & Plan:   Lindie was seen today for anemia, hypertension and diabetes.  Diagnoses and all orders for this visit:  Essential hypertension- her blood pressure is well controlled. Electrolytes and renal function are normal. -       Comprehensive metabolic panel; Future -     Urinalysis, Routine w reflex microscopic; Future  Anemia due to acquired thiamine deficiency- her H&H have improved slightly. I encouraged her to be compliant with the thiamine replacement therapy. -     CBC with Differential/Platelet; Future -     Vitamin B1; Future  Type 2 diabetes mellitus with complication, without long-term current use of insulin ([employer])- her A1c is down to 6.2% and she has symptoms suspicious of GLP1 agonist therapy. I've asked her to stop Victoza for a few days to see if her symptoms improve. -     Comprehensive metabolic panel; Future -     Hemoglobin A1c; Future  Hypertriglyceridemia- her triglycerides are still elevated. I have encouraged her to improve her lifestyle modifications. -     Triglycerides; Future   I am having Ms. Towe maintain her aspirin, glucose blood, cetirizine, glucose blood, ONETOUCH VERIO IQ SYSTEM, Suvorexant, omeprazole, simvastatin, fluticasone, clonazePAM, cloNIDine, triamterene-hydrochlorothiazide, labetalol, linaclotide,  Lorcaserin HCl ER, Insulin Pen Needle, thiamine, telmisartan, liraglutide, and VASCEPA.  No orders of the defined types were placed in this encounter.    Follow-up: Return in about 4 weeks (around 02/05/2017).  Scarlette Calico, MD

## 2017-01-12 LAB — VITAMIN B1: VITAMIN B1 (THIAMINE): 47 nmol/L — AB (ref 8–30)

## 2017-01-13 DIAGNOSIS — Z0279 Encounter for issue of other medical certificate: Secondary | ICD-10-CM

## 2017-01-22 ENCOUNTER — Telehealth: Payer: Self-pay | Admitting: Emergency Medicine

## 2017-01-22 NOTE — Telephone Encounter (Signed)
Pt called and stated she has been throwing up. She knows Dr Ronnald Ramp is out of the office but asked that you call her back. Thanks.

## 2017-01-23 NOTE — Telephone Encounter (Signed)
LVM for pt to call back as soon as possible.   

## 2017-01-23 NOTE — Telephone Encounter (Signed)
Called pt back and she stated that she had taken some zinc on an empty stomach.  Informed pt that supplement should be taken with food.

## 2017-02-07 ENCOUNTER — Ambulatory Visit: Payer: 59 | Admitting: Nurse Practitioner

## 2017-02-13 ENCOUNTER — Encounter: Payer: Self-pay | Admitting: Internal Medicine

## 2017-02-13 ENCOUNTER — Ambulatory Visit (INDEPENDENT_AMBULATORY_CARE_PROVIDER_SITE_OTHER): Payer: 59 | Admitting: Internal Medicine

## 2017-02-13 VITALS — BP 140/90 | HR 77 | Temp 98.4°F | Resp 16 | Ht 65.0 in | Wt 188.0 lb

## 2017-02-13 DIAGNOSIS — S8011XA Contusion of right lower leg, initial encounter: Secondary | ICD-10-CM

## 2017-02-13 NOTE — Progress Notes (Signed)
Subjective:  Patient ID: Carolyn [name], female    DOB: 1961-01-17  Age: 56 y.o. MRN: [id number]  CC: Leg Injury   HPI BRITTNEE GAETANO presents for concerns about her right lower extremity.  She tells me about 2 weeks ago she tripped and fell and sustained a ground-level fall injuring her right anterior shin.  She said initially there was some bruising and swelling but that has resolved.  She had some soreness but never lost her ability to bear weight.  There were no wounds.  Someone told her she should be concerned about a blood clot though her right lower leg is not swollen.  She has not taken anything for pain and she denies numbness, weakness, tingling.  Outpatient Medications Prior to Visit  Medication Sig Dispense Refill  . aspirin EC 81 MG EC tablet Take 1 tablet (81 mg total) by mouth daily.    . Blood Glucose Monitoring Suppl (ONETOUCH VERIO IQ SYSTEM) W/DEVICE KIT 1 Act by Does not apply route 3 (three) times daily. 2 kit 0  . cetirizine (ZYRTEC) 10 MG chewable tablet Chew 10 mg by mouth daily.    . clonazePAM (KLONOPIN) 0.5 MG tablet Take 1 tablet (0.5 mg total) by mouth 2 (two) times daily as needed for anxiety. 20 tablet 2  . fluticasone (FLONASE) 50 MCG/ACT nasal spray Place 2 sprays into both nostrils daily. 48 g 3  . glucose blood (ONE TOUCH ULTRA TEST) test strip Use to test blood sugar three times a week and prn if having symptoms of low blood sugar Dx: 250.92 90 day supply 100 each 11  . glucose blood (ONETOUCH VERIO) test strip Use TID 100 each 12  . Insulin Pen Needle (NOVOFINE) 32G X 6 MM MISC 1 Act by Does not apply route daily. 100 each 3  . labetalol (NORMODYNE) 300 MG tablet TAKE 1 TABLET BY MOUTH TWICE DAILY 180 tablet 2  . linaclotide (LINZESS) 290 MCG CAPS capsule Take 1 capsule (290 mcg total) by mouth daily before breakfast. 90 capsule 1  . omeprazole (PRILOSEC) 40 MG capsule Take 1 capsule by mouth  twice daily at 8am and 10pm 180 capsule 2  . simvastatin  (ZOCOR) 40 MG tablet TAKE ONE TABLET BY MOUTH AT BEDTIME 90 tablet 3  . Suvorexant (BELSOMRA) 15 MG TABS Take 1 tablet by mouth at bedtime as needed. 30 tablet 5  . telmisartan (MICARDIS) 80 MG tablet Take 1 tablet (80 mg total) by mouth daily. 90 tablet 1  . thiamine (VITAMIN B-1) 100 MG tablet Take 1 tablet (100 mg total) by mouth daily. 90 tablet 3  . triamterene-hydrochlorothiazide (DYAZIDE) 37.5-25 MG capsule TAKE ONE CAPSULE BY MOUTH ONCE DAILY 30 capsule 5  . VASCEPA 1 g CAPS TAKE TWO CAPSULES BY MOUTH TWICE DAILY. 120 capsule 5  . cloNIDine (CATAPRES) 0.1 MG tablet TAKE TWO TABLETS BY MOUTH ONCE DAILY 60 tablet 5  . Lorcaserin HCl ER (BELVIQ XR) 20 MG TB24 Take 1 tablet by mouth daily. (Patient not taking: Reported on 02/13/2017) 30 tablet 5   No facility-administered medications prior to visit.     ROS Review of Systems  All other systems reviewed and are negative.   Objective:  BP 140/90 (BP Location: Left Arm, Patient Position: Sitting, Cuff Size: Large)   Pulse 77   Temp 98.4 F (36.9 C) (Oral)   Resp 16   Ht _0  (1.651 m)   Wt 188 lb (85.3 kg)   SpO2 93%  BMI 31.28 kg/m   BP Readings from Last 3 Encounters:  02/13/17 140/90  01/08/17 130/80  09/12/16 112/72    Wt Readings from Last 3 Encounters:  02/13/17 188 lb (85.3 kg)  01/08/17 184 lb 8 oz (83.7 kg)  09/12/16 187 lb (84.8 kg)    Physical Exam  Musculoskeletal:       Right lower leg: Normal. She exhibits no tenderness, no bony tenderness, no swelling, no edema, no deformity and no laceration.       Right foot: Normal. There is normal range of motion, no tenderness, no bony tenderness, no swelling and no deformity.    Lab Results  Component Value Date   WBC 8.3 01/08/2017   HGB 10.6 (L) 01/08/2017   HCT 31.0 (L) 01/08/2017   PLT 309.0 01/08/2017   GLUCOSE 100 (H) 01/08/2017   CHOL 182 07/11/2016   TRIG 254.0 (H) 01/08/2017   HDL 36.70 (L) 07/11/2016   LDLDIRECT 78.0 07/11/2016   LDLCALC 68  07/30/2013   ALT 18 01/08/2017   AST 12 01/08/2017   NA 136 01/08/2017   K 3.7 01/08/2017   CL 99 01/08/2017   CREATININE 0.91 01/08/2017   BUN 13 01/08/2017   CO2 29 01/08/2017   TSH 2.40 10/27/2014   INR 1.04 01/14/2013   HGBA1C 6.2 01/08/2017   MICROALBUR 19.7 (H) 07/11/2016    Ct Head Wo Contrast  Result Date: 05/31/2016 CLINICAL DATA:  H/A X 13 days, vertigo, ears ringing, drum beat in earNO HX CA, SXHX DM,HTN EXAM: CT HEAD WITHOUT CONTRAST TECHNIQUE: Contiguous axial images were obtained from the base of the skull through the vertex without intravenous contrast. COMPARISON:  10/11/2010 FINDINGS: Brain: No evidence of acute infarction, hemorrhage, hydrocephalus, extra-axial collection or mass lesion/mass effect. Vascular: No hyperdense vessel or unexpected calcification. Skull: Normal. Negative for fracture or focal lesion. Sinuses/Orbits: Globes orbits are unremarkable. Visualized sinuses and mastoid air cells are clear. Other: None. IMPRESSION: 1. Normal exam. Electronically Signed   By: Lajean Manes M.D.   On: 05/31/2016 09:04    Assessment & Plan:   Joselyne was seen today for leg injury.  Diagnoses and all orders for this visit:  Contusion of right lower leg, initial encounter- the exam is unremarkable.  She deferred on having this x-rayed.  She does not want anything for pain.  I reassured her that I do not see any evidence of a blood clot.   I am having [name] maintain her aspirin, glucose blood, cetirizine, glucose blood, ONETOUCH VERIO IQ SYSTEM, Suvorexant, omeprazole, simvastatin, fluticasone, clonazePAM, triamterene-hydrochlorothiazide, labetalol, linaclotide, Lorcaserin HCl ER, Insulin Pen Needle, thiamine, telmisartan, and VASCEPA.  No orders of the defined types were placed in this encounter.    Follow-up: No Follow-up on file.  Scarlette Calico, MD

## 2017-02-15 ENCOUNTER — Other Ambulatory Visit: Payer: Self-pay | Admitting: Internal Medicine

## 2017-02-16 ENCOUNTER — Encounter: Payer: Self-pay | Admitting: Internal Medicine

## 2017-02-16 DIAGNOSIS — S8011XA Contusion of right lower leg, initial encounter: Secondary | ICD-10-CM | POA: Insufficient documentation

## 2017-02-20 ENCOUNTER — Other Ambulatory Visit: Payer: Self-pay | Admitting: Internal Medicine

## 2017-02-24 ENCOUNTER — Telehealth: Payer: Self-pay | Admitting: Internal Medicine

## 2017-02-24 NOTE — Telephone Encounter (Signed)
Patient believes she has a UTI.  Would like to know if she could have a lab order placed instead of coming into office?

## 2017-02-24 NOTE — Telephone Encounter (Signed)
Appt is required. Please ask patient to come in for a visit

## 2017-02-26 ENCOUNTER — Ambulatory Visit (INDEPENDENT_AMBULATORY_CARE_PROVIDER_SITE_OTHER): Payer: 59 | Admitting: Nurse Practitioner

## 2017-02-26 ENCOUNTER — Encounter: Payer: Self-pay | Admitting: Nurse Practitioner

## 2017-02-26 ENCOUNTER — Other Ambulatory Visit: Payer: 59

## 2017-02-26 VITALS — BP 132/86 | HR 102 | Temp 97.6°F | Ht 65.0 in | Wt 188.0 lb

## 2017-02-26 DIAGNOSIS — R300 Dysuria: Secondary | ICD-10-CM

## 2017-02-26 LAB — POCT URINALYSIS DIPSTICK
Bilirubin, UA: NEGATIVE
Glucose, UA: NEGATIVE
Ketones, UA: NEGATIVE
NITRITE UA: NEGATIVE
PH UA: 6 (ref 5.0–8.0)
RBC UA: NEGATIVE
SPEC GRAV UA: 1.02 (ref 1.010–1.025)
UROBILINOGEN UA: 0.2 U/dL

## 2017-02-26 MED ORDER — CIPROFLOXACIN HCL 500 MG PO TABS: 500.0000 mg | ORAL_TABLET | Freq: Two times a day (BID) | ORAL | 0 refills | Status: DC

## 2017-02-26 MED ORDER — FLUCONAZOLE 150 MG PO TABS: 150.0000 mg | ORAL_TABLET | Freq: Once | ORAL | 0 refills | Status: AC

## 2017-02-26 NOTE — Progress Notes (Signed)
Subjective:  Patient ID: Carolyn [name], female    DOB: [birth year]  Age: 56 y.o. MRN: [id number]  CC: Urinary Tract Infection (frequent urinate and burning sensation--3 days. )   Urinary Tract Infection   This is a new problem. The current episode started in the past 7 days. The problem occurs every urination. The problem has been unchanged. The quality of the pain is described as burning. The pain is mild. There has been no fever. She is sexually active. There is no history of pyelonephritis. Associated symptoms include frequency and urgency. Pertinent negatives include no chills, discharge, flank pain, hesitancy or possible pregnancy. She has tried increased fluids for the symptoms. The treatment provided no relief.    Outpatient Medications Prior to Visit  Medication Sig Dispense Refill  . aspirin EC 81 MG EC tablet Take 1 tablet (81 mg total) by mouth daily.    . Blood Glucose Monitoring Suppl (ONETOUCH VERIO IQ SYSTEM) W/DEVICE KIT 1 Act by Does not apply route 3 (three) times daily. 2 kit 0  . cetirizine (ZYRTEC) 10 MG chewable tablet Chew 10 mg by mouth daily.    . clonazePAM (KLONOPIN) 0.5 MG tablet Take 1 tablet (0.5 mg total) by mouth 2 (two) times daily as needed for anxiety. 20 tablet 2  . cloNIDine (CATAPRES) 0.1 MG tablet TAKE 2 TABLETS BY MOUTH ONCE DAILY 60 tablet 5  . fluticasone (FLONASE) 50 MCG/ACT nasal spray Place 2 sprays into both nostrils daily. 48 g 3  . glucose blood (ONE TOUCH ULTRA TEST) test strip Use to test blood sugar three times a week and prn if having symptoms of low blood sugar Dx: 250.92 90 day supply 100 each 11  . glucose blood (ONETOUCH VERIO) test strip Use TID 100 each 12  . Insulin Pen Needle (NOVOFINE) 32G X 6 MM MISC 1 Act by Does not apply route daily. 100 each 3  . labetalol (NORMODYNE) 300 MG tablet TAKE 1 TABLET BY MOUTH TWICE DAILY 180 tablet 2  . linaclotide (LINZESS) 290 MCG CAPS capsule Take 1 capsule (290 mcg total) by mouth daily before  breakfast. 90 capsule 1  . Lorcaserin HCl ER (BELVIQ XR) 20 MG TB24 Take 1 tablet by mouth daily. 30 tablet 5  . omeprazole (PRILOSEC) 40 MG capsule Take 1 capsule by mouth  twice daily at 8am and 10pm 180 capsule 2  . simvastatin (ZOCOR) 40 MG tablet TAKE ONE TABLET BY MOUTH AT BEDTIME 90 tablet 3  . Suvorexant (BELSOMRA) 15 MG TABS Take 1 tablet by mouth at bedtime as needed. 30 tablet 5  . telmisartan (MICARDIS) 80 MG tablet Take 1 tablet (80 mg total) by mouth daily. 90 tablet 1  . thiamine (VITAMIN B-1) 100 MG tablet Take 1 tablet (100 mg total) by mouth daily. 90 tablet 3  . triamterene-hydrochlorothiazide (DYAZIDE) 37.5-25 MG capsule TAKE 1 CAPSULE BY MOUTH ONCE DAILY 30 capsule 5  . VASCEPA 1 g CAPS TAKE TWO CAPSULES BY MOUTH TWICE DAILY. 120 capsule 5   No facility-administered medications prior to visit.     ROS See HPI  Objective:  BP 132/86   Pulse (!) 102   Temp 97.6 F (36.4 C)   Ht '5\' 5"'  (1.651 m)   Wt 188 lb (85.3 kg)   SpO2 100%   BMI 31.28 kg/m   BP Readings from Last 3 Encounters:  02/26/17 132/86  02/13/17 140/90  01/08/17 130/80    Wt Readings from Last 3 Encounters:  02/26/17  188 lb (85.3 kg)  02/13/17 188 lb (85.3 kg)  01/08/17 184 lb 8 oz (83.7 kg)    Physical Exam  Constitutional: She is oriented to person, place, and time.  Cardiovascular: Normal rate.  Pulmonary/Chest: Effort normal.  Abdominal: Soft. There is tenderness.  Neurological: She is alert and oriented to person, place, and time.  Skin: Skin is warm and dry.  Vitals reviewed.   Lab Results  Component Value Date   WBC 8.3 01/08/2017   HGB 10.6 (L) 01/08/2017   HCT 31.0 (L) 01/08/2017   PLT 309.0 01/08/2017   GLUCOSE 100 (H) 01/08/2017   CHOL 182 07/11/2016   TRIG 254.0 (H) 01/08/2017   HDL 36.70 (L) 07/11/2016   LDLDIRECT 78.0 07/11/2016   LDLCALC 68 07/30/2013   ALT 18 01/08/2017   AST 12 01/08/2017   NA 136 01/08/2017   K 3.7 01/08/2017   CL 99 01/08/2017    CREATININE 0.91 01/08/2017   BUN 13 01/08/2017   CO2 29 01/08/2017   TSH 2.40 10/27/2014   INR 1.04 01/14/2013   HGBA1C 6.2 01/08/2017   MICROALBUR 19.7 (H) 07/11/2016    Ct Head Wo Contrast  Result Date: 05/31/2016 CLINICAL DATA:  H/A X 13 days, vertigo, ears ringing, drum beat in earNO HX CA, SXHX DM,HTN EXAM: CT HEAD WITHOUT CONTRAST TECHNIQUE: Contiguous axial images were obtained from the base of the skull through the vertex without intravenous contrast. COMPARISON:  10/11/2010 FINDINGS: Brain: No evidence of acute infarction, hemorrhage, hydrocephalus, extra-axial collection or mass lesion/mass effect. Vascular: No hyperdense vessel or unexpected calcification. Skull: Normal. Negative for fracture or focal lesion. Sinuses/Orbits: Globes orbits are unremarkable. Visualized sinuses and mastoid air cells are clear. Other: None. IMPRESSION: 1. Normal exam. Electronically Signed   By: Lajean Manes M.D.   On: 05/31/2016 09:04    Assessment & Plan:   Carolyn [name] was seen today for urinary tract infection.  Diagnoses and all orders for this visit:  Dysuria -     POCT urinalysis dipstick -     Urine Culture; Future -     ciprofloxacin (CIPRO) 500 MG tablet; Take 1 tablet (500 mg total) 2 (two) times daily by mouth. -     fluconazole (DIFLUCAN) 150 MG tablet; Take 1 tablet (150 mg total) once for 1 dose by mouth.   I am having Carolyn [name] start on ciprofloxacin and fluconazole. I am also having her maintain her aspirin, glucose blood, cetirizine, glucose blood, ONETOUCH VERIO IQ SYSTEM, Suvorexant, omeprazole, simvastatin, fluticasone, clonazePAM, labetalol, linaclotide, Lorcaserin HCl ER, Insulin Pen Needle, thiamine, telmisartan, VASCEPA, cloNIDine, and triamterene-hydrochlorothiazide.  Meds ordered this encounter  Medications  . ciprofloxacin (CIPRO) 500 MG tablet    Sig: Take 1 tablet (500 mg total) 2 (two) times daily by mouth.    Dispense:  6 tablet    Refill:  0    Order  Specific Question:   Supervising Provider    Answer:   Cassandria Anger [[id number]]  . fluconazole (DIFLUCAN) 150 MG tablet    Sig: Take 1 tablet (150 mg total) once for 1 dose by mouth.    Dispense:  1 tablet    Refill:  0    Order Specific Question:   Supervising Provider    Answer:   Cassandria Anger [[id number]]    Follow-up: No Follow-up on file.  Wilfred Lacy, NP

## 2017-02-26 NOTE — Telephone Encounter (Signed)
Patient called back to see what was going on. She has been informed and set up an appointment for today 11/14.

## 2017-02-26 NOTE — Patient Instructions (Signed)
You will be called with urine culture results

## 2017-02-27 ENCOUNTER — Other Ambulatory Visit: Payer: Self-pay | Admitting: Internal Medicine

## 2017-02-28 ENCOUNTER — Telehealth: Payer: Self-pay

## 2017-02-28 NOTE — Telephone Encounter (Signed)
Key: [id number]

## 2017-03-01 LAB — URINE CULTURE
MICRO NUMBER: 81284077
SPECIMEN QUALITY: ADEQUATE

## 2017-03-02 ENCOUNTER — Other Ambulatory Visit: Payer: Self-pay | Admitting: Nurse Practitioner

## 2017-03-02 DIAGNOSIS — R300 Dysuria: Secondary | ICD-10-CM

## 2017-03-02 MED ORDER — CIPROFLOXACIN HCL 500 MG PO TABS: 500.0000 mg | ORAL_TABLET | Freq: Two times a day (BID) | ORAL | 0 refills | Status: DC

## 2017-03-09 ENCOUNTER — Other Ambulatory Visit: Payer: Self-pay | Admitting: Internal Medicine

## 2017-03-09 DIAGNOSIS — E118 Type 2 diabetes mellitus with unspecified complications: Secondary | ICD-10-CM

## 2017-03-09 MED ORDER — LIRAGLUTIDE 18 MG/3ML ~~LOC~~ SOPN: 1.8000 mg | PEN_INJECTOR | Freq: Every day | SUBCUTANEOUS | 1 refills | Status: DC

## 2017-03-10 ENCOUNTER — Telehealth: Payer: Self-pay | Admitting: Internal Medicine

## 2017-03-10 ENCOUNTER — Other Ambulatory Visit: Payer: Self-pay | Admitting: Internal Medicine

## 2017-03-10 DIAGNOSIS — E781 Pure hyperglyceridemia: Secondary | ICD-10-CM

## 2017-03-10 DIAGNOSIS — E118 Type 2 diabetes mellitus with unspecified complications: Secondary | ICD-10-CM

## 2017-03-10 MED ORDER — OMEGA-3-ACID ETHYL ESTERS 1 G PO CAPS: 2.0000 g | ORAL_CAPSULE | Freq: Two times a day (BID) | ORAL | 1 refills | Status: DC

## 2017-03-10 NOTE — Telephone Encounter (Signed)
States victoza needs to be sent to [facility] [address] and not mail order.   Patient states she got a letter from her [facility] in regard to telmisartan stating they need a response from provider.  Patient is not sure what is going on with this med either.  Is requesting call back in regard.

## 2017-03-10 NOTE — Telephone Encounter (Signed)
Patient states this med was not approved.  Patient states she has gotten OTC fish pills and has doubled to 4000ICU.  Would like call back in regard if this is OK to do?

## 2017-03-10 NOTE — Telephone Encounter (Signed)
Try lovaza RX sent 

## 2017-03-11 ENCOUNTER — Telehealth: Payer: Self-pay | Admitting: Internal Medicine

## 2017-03-11 MED ORDER — OMEGA-3-ACID ETHYL ESTERS 1 G PO CAPS: 2.0000 g | ORAL_CAPSULE | Freq: Two times a day (BID) | ORAL | 1 refills | Status: DC

## 2017-03-11 MED ORDER — LIRAGLUTIDE 18 MG/3ML ~~LOC~~ SOPN: 1.8000 mg | PEN_INJECTOR | Freq: Every day | SUBCUTANEOUS | 1 refills | Status: DC

## 2017-03-11 NOTE — Telephone Encounter (Signed)
Informed patient.  Patient wants to leave [facility].  She is just not using right now.

## 2017-03-11 NOTE — Telephone Encounter (Signed)
Copied from [place] [phone]. Topic: Quick Communication - See Telephone Encounter >> Mar 11, 2017  3:07 PM Corie Chiquito, [place] wrote: CRM for notification. See Telephone encounter for: Patient picked up medication from pharmacy and received an antibiotic and wanted to know she she still be taking the medication. If someone from the off could give her a call back please.  03/11/17.

## 2017-03-11 NOTE — Telephone Encounter (Signed)
LVM for pt to call back as soon as possible.   RE: Left detailed message stating that PCP sent in Lovaza to pt pharmacy.

## 2017-03-11 NOTE — Telephone Encounter (Signed)
Left message for pt to call back and discuss.

## 2017-03-11 NOTE — Telephone Encounter (Signed)
Telmisartan should have a refill and the victoza was sent to [facility] because [facility] sent the request.   I have sent the victoza to [place]. Does patient want [place] to delete [facility] from her pharmacy list.

## 2017-03-13 ENCOUNTER — Telehealth: Payer: Self-pay

## 2017-03-13 DIAGNOSIS — I10 Essential (primary) hypertension: Secondary | ICD-10-CM

## 2017-03-13 DIAGNOSIS — E118 Type 2 diabetes mellitus with unspecified complications: Secondary | ICD-10-CM

## 2017-03-13 MED ORDER — TELMISARTAN 80 MG PO TABS: 80.0000 mg | ORAL_TABLET | Freq: Every day | ORAL | 1 refills | Status: DC

## 2017-03-13 NOTE — Telephone Encounter (Signed)
[facility] sent request to refill telmisartan 80 mg tablets.   Erx sent as requested.

## 2017-04-11 ENCOUNTER — Telehealth: Payer: Self-pay | Admitting: Internal Medicine

## 2017-04-11 DIAGNOSIS — E669 Obesity, unspecified: Secondary | ICD-10-CM

## 2017-04-11 MED ORDER — FLUCONAZOLE 150 MG PO TABS: 150.0000 mg | ORAL_TABLET | Freq: Once | ORAL | 0 refills | Status: DC

## 2017-04-11 MED ORDER — LORCASERIN HCL ER 20 MG PO TB24: 1.0000 | ORAL_TABLET | Freq: Every day | ORAL | 5 refills | Status: DC

## 2017-04-11 MED ORDER — SIMVASTATIN 40 MG PO TABS: 40.0000 mg | ORAL_TABLET | Freq: Every day | ORAL | 3 refills | Status: DC

## 2017-04-11 NOTE — Telephone Encounter (Signed)
Copied from [place] [phone]. Topic: Quick Communication - See Telephone Encounter >> Apr 11, 2017  4:45 PM Cleaster Corin, [place] wrote: CRM for notification. See Telephone encounter for:   04/11/17. Pt. Calling to get refill on med. Simuastain she is completely out  [place], [place] - [place] [address] [place] [place] [address] Phone: [phone] Fax: [phone]

## 2017-04-11 NOTE — Telephone Encounter (Signed)
erx sent in for simvastatin.  Okay per Plotnikov to send in [place].  Pt request rx for belviq and copay card.

## 2017-04-12 ENCOUNTER — Other Ambulatory Visit: Payer: Self-pay | Admitting: Internal Medicine

## 2017-04-13 ENCOUNTER — Other Ambulatory Visit: Payer: Self-pay | Admitting: Internal Medicine

## 2017-04-14 ENCOUNTER — Telehealth: Payer: Self-pay | Admitting: Internal Medicine

## 2017-04-14 DIAGNOSIS — Z794 Long term (current) use of insulin: Principal | ICD-10-CM

## 2017-04-14 DIAGNOSIS — E118 Type 2 diabetes mellitus with unspecified complications: Secondary | ICD-10-CM

## 2017-04-14 MED ORDER — INSULIN PEN NEEDLE 32G X 6 MM MISC: 1.0000 | Freq: Every day | 3 refills | Status: DC

## 2017-04-14 NOTE — Telephone Encounter (Signed)
Copied from [place] [phone]. Topic: Quick Communication - Rx Refill/Question >> Apr 14, 2017 12:51 PM Scherrie Gerlach wrote: Has the patient contacted their pharmacy? {yes  Pt request refill  Insulin Pen Needle (NOVOFINE) 32G X 6 MM [place], [place] [address] [phone] (Phone) [phone] (Fax)  Pt has no more left

## 2017-04-14 NOTE — Telephone Encounter (Signed)
rx and copay card has been mailed to pt.

## 2017-06-09 ENCOUNTER — Encounter: Payer: Self-pay | Admitting: Internal Medicine

## 2017-06-09 ENCOUNTER — Ambulatory Visit (INDEPENDENT_AMBULATORY_CARE_PROVIDER_SITE_OTHER): Payer: 59 | Admitting: Internal Medicine

## 2017-06-09 ENCOUNTER — Other Ambulatory Visit (INDEPENDENT_AMBULATORY_CARE_PROVIDER_SITE_OTHER): Payer: 59

## 2017-06-09 VITALS — BP 120/80 | HR 84 | Temp 98.9°F | Resp 16 | Ht 65.0 in | Wt 190.0 lb

## 2017-06-09 DIAGNOSIS — M5416 Radiculopathy, lumbar region: Secondary | ICD-10-CM | POA: Diagnosis not present

## 2017-06-09 DIAGNOSIS — I10 Essential (primary) hypertension: Secondary | ICD-10-CM

## 2017-06-09 DIAGNOSIS — D538 Other specified nutritional anemias: Secondary | ICD-10-CM

## 2017-06-09 DIAGNOSIS — E519 Thiamine deficiency, unspecified: Secondary | ICD-10-CM

## 2017-06-09 DIAGNOSIS — E118 Type 2 diabetes mellitus with unspecified complications: Secondary | ICD-10-CM

## 2017-06-09 DIAGNOSIS — D649 Anemia, unspecified: Secondary | ICD-10-CM

## 2017-06-09 DIAGNOSIS — D539 Nutritional anemia, unspecified: Secondary | ICD-10-CM

## 2017-06-09 LAB — BASIC METABOLIC PANEL
BUN: 14 mg/dL (ref 6–23)
CALCIUM: 10.6 mg/dL — AB (ref 8.4–10.5)
CO2: 28 mEq/L (ref 19–32)
CREATININE: 0.9 mg/dL (ref 0.40–1.20)
Chloride: 101 mEq/L (ref 96–112)
GFR: 83.51 mL/min (ref >60.00)
Glucose, Bld: 108 mg/dL — ABNORMAL HIGH (ref 70–99)
Potassium: 4.1 mEq/L (ref 3.5–5.1)
Sodium: 139 mEq/L (ref 135–145)

## 2017-06-09 LAB — CBC WITH DIFFERENTIAL/PLATELET
BASOS ABS: 0.1 10*3/uL (ref 0.0–0.1)
BASOS PCT: 1.1 % (ref 0.0–3.0)
EOS ABS: 0.2 10*3/uL (ref 0.0–0.7)
Eosinophils Relative: 2.5 % (ref 0.0–5.0)
HCT: 30.3 % — ABNORMAL LOW (ref 36.0–46.0)
Hemoglobin: 10.5 g/dL — ABNORMAL LOW (ref 12.0–15.0)
Lymphocytes Relative: 30.4 % (ref 12.0–46.0)
Lymphs Abs: 2.4 10*3/uL (ref 0.7–4.0)
MCHC: 34.8 g/dL (ref 30.0–36.0)
MCV: 86.6 fl (ref 78.0–100.0)
MONO ABS: 0.4 10*3/uL (ref 0.1–1.0)
Monocytes Relative: 5.7 % (ref 3.0–12.0)
Neutro Abs: 4.8 10*3/uL (ref 1.4–7.7)
Neutrophils Relative %: 60.3 % (ref 43.0–77.0)
Platelets: 300 10*3/uL (ref 150.0–400.0)
RBC: 3.49 Mil/uL — AB (ref 3.87–5.11)
RDW: 13.4 % (ref 11.5–15.5)
WBC: 7.9 10*3/uL (ref 4.0–10.5)

## 2017-06-09 LAB — HEMOGLOBIN A1C: HEMOGLOBIN A1C: 6.4 % (ref 4.6–6.5)

## 2017-06-09 MED ORDER — METHYLPREDNISOLONE 4 MG PO TBPK: ORAL_TABLET | ORAL | 0 refills | Status: DC

## 2017-06-09 MED ORDER — ETODOLAC ER 500 MG PO TB24: 500.0000 mg | ORAL_TABLET | Freq: Every day | ORAL | 0 refills | Status: DC

## 2017-06-09 NOTE — Progress Notes (Signed)
Subjective:  Patient ID: Carolyn [name], female    DOB: Jul 20, 1960  Age: 57 y.o. MRN: [id number]  CC: Anemia; Hyperlipidemia; Diabetes; and Back Pain   HPI CHEYNE BUNGERT presents for f/up - She complains of a one-week history of low back pain that she describes as an achy sensation that has occasionally radiates into her left thigh.  She has gotten no symptom relief with Tylenol or Motrin.  She denies any trauma or injury.  She has not noticed any paresthesias in her lower extremities.  Outpatient Medications Prior to Visit  Medication Sig Dispense Refill  . aspirin EC 81 MG EC tablet Take 1 tablet (81 mg total) by mouth daily.    . Blood Glucose Monitoring Suppl (ONETOUCH VERIO IQ SYSTEM) W/DEVICE KIT 1 Act by Does not apply route 3 (three) times daily. 2 kit 0  . cetirizine (ZYRTEC) 10 MG chewable tablet Chew 10 mg by mouth daily.    . clonazePAM (KLONOPIN) 0.5 MG tablet Take 1 tablet (0.5 mg total) by mouth 2 (two) times daily as needed for anxiety. 20 tablet 2  . cloNIDine (CATAPRES) 0.1 MG tablet TAKE 2 TABLETS BY MOUTH ONCE DAILY 60 tablet 5  . fluticasone (FLONASE) 50 MCG/ACT nasal spray Place 2 sprays into both nostrils daily. 48 g 3  . glucose blood (ONE TOUCH ULTRA TEST) test strip Use to test blood sugar three times a week and prn if having symptoms of low blood sugar Dx: 250.92 90 day supply 100 each 11  . glucose blood (ONETOUCH VERIO) test strip Use TID 100 each 12  . Insulin Pen Needle (NOVOFINE) 32G X 6 MM MISC 1 Act by Does not apply route daily. 100 each 3  . labetalol (NORMODYNE) 300 MG tablet TAKE 1 TABLET BY MOUTH TWICE DAILY 180 tablet 2  . linaclotide (LINZESS) 290 MCG CAPS capsule Take 1 capsule (290 mcg total) by mouth daily before breakfast. 90 capsule 1  . liraglutide (VICTOZA) 18 MG/3ML SOPN Inject 0.3 mLs (1.8 mg total) into the skin daily. 18 pen 1  . Lorcaserin HCl ER (BELVIQ XR) 20 MG TB24 Take 1 tablet by mouth daily. 30 tablet 5  . omega-3 acid ethyl  esters (LOVAZA) 1 g capsule Take 2 capsules (2 g total) by mouth 2 (two) times daily. 360 capsule 1  . omeprazole (PRILOSEC) 40 MG capsule TAKE ONE CAPSULE BY MOUTH TWICE DAILY AT 8 AM AND 10 PM 180 capsule 1  . simvastatin (ZOCOR) 40 MG tablet Take 1 tablet (40 mg total) by mouth at bedtime. 90 tablet 3  . Suvorexant (BELSOMRA) 15 MG TABS Take 1 tablet by mouth at bedtime as needed. 30 tablet 5  . telmisartan (MICARDIS) 80 MG tablet Take 1 tablet (80 mg total) by mouth daily. 90 tablet 1  . thiamine (VITAMIN B-1) 100 MG tablet Take 1 tablet (100 mg total) by mouth daily. 90 tablet 3  . triamterene-hydrochlorothiazide (DYAZIDE) 37.5-25 MG capsule TAKE 1 CAPSULE BY MOUTH ONCE DAILY 30 capsule 5   No facility-administered medications prior to visit.     ROS Review of Systems  Constitutional: Negative.  Negative for appetite change, diaphoresis, fatigue, fever and unexpected weight change.  HENT: Negative.  Negative for sinus pressure, sore throat and trouble swallowing.   Eyes: Negative for visual disturbance.  Respiratory: Negative for cough, chest tightness, shortness of breath and wheezing.   Cardiovascular: Negative for chest pain, palpitations and leg swelling.  Gastrointestinal: Negative for abdominal pain, constipation, diarrhea,  nausea and vomiting.  Endocrine: Negative.   Genitourinary: Negative.  Negative for difficulty urinating.  Musculoskeletal: Positive for back pain. Negative for arthralgias and myalgias.  Skin: Negative.  Negative for rash.  Allergic/Immunologic: Negative.   Neurological: Negative.  Negative for dizziness, weakness and numbness.  Hematological: Negative for adenopathy. Does not bruise/bleed easily.  Psychiatric/Behavioral: Negative.     Objective:  BP 120/80 (BP Location: Left Arm, Patient Position: Sitting, Cuff Size: Large)   Pulse 84   Temp 98.9 F (37.2 C) (Oral)   Resp 16   Ht '5\' 5"'  (1.651 m)   Wt 190 lb (86.2 kg)   SpO2 98%   BMI 31.62 kg/m     BP Readings from Last 3 Encounters:  06/09/17 120/80  02/26/17 132/86  02/13/17 140/90    Wt Readings from Last 3 Encounters:  06/09/17 190 lb (86.2 kg)  02/26/17 188 lb (85.3 kg)  02/13/17 188 lb (85.3 kg)    Physical Exam  Constitutional: She is oriented to person, place, and time. No distress.  HENT:  Mouth/Throat: Oropharynx is clear and moist. No oropharyngeal exudate.  Eyes: Conjunctivae are normal. Left eye exhibits no discharge. No scleral icterus.  Neck: Normal range of motion. Neck supple. No JVD present. No thyromegaly present.  Cardiovascular: Normal rate, regular rhythm and normal heart sounds. Exam reveals no gallop.  No murmur heard. Pulmonary/Chest: Effort normal and breath sounds normal. No respiratory distress. She has no wheezes. She has no rales.  Abdominal: Soft. Bowel sounds are normal. She exhibits no distension and no mass. There is no tenderness. There is no guarding.  Musculoskeletal: Normal range of motion. She exhibits no edema, tenderness or deformity.  Lymphadenopathy:    She has no cervical adenopathy.  Neurological: She is alert and oriented to person, place, and time. She has normal strength. She displays no atrophy, no tremor and normal reflexes. No sensory deficit. She exhibits normal muscle tone. She displays a negative Romberg sign. She displays no seizure activity. Coordination and gait normal.  Neg SLR in BLE  Skin: Skin is warm and dry. No rash noted. She is not diaphoretic. No erythema. No pallor.  Vitals reviewed.   Lab Results  Component Value Date   WBC 7.9 06/09/2017   HGB 10.5 (L) 06/09/2017   HCT 30.3 (L) 06/09/2017   PLT 300.0 06/09/2017   GLUCOSE 108 (H) 06/09/2017   CHOL 182 07/11/2016   TRIG 254.0 (H) 01/08/2017   HDL 36.70 (L) 07/11/2016   LDLDIRECT 78.0 07/11/2016   LDLCALC 68 07/30/2013   ALT 18 01/08/2017   AST 12 01/08/2017   NA 139 06/09/2017   K 4.1 06/09/2017   CL 101 06/09/2017   CREATININE 0.90 06/09/2017    BUN 14 06/09/2017   CO2 28 06/09/2017   TSH 2.40 10/27/2014   INR 1.04 01/14/2013   HGBA1C 6.4 06/09/2017   MICROALBUR 19.7 (H) 07/11/2016    Ct Head Wo Contrast  Result Date: 05/31/2016 CLINICAL DATA:  H/A X 13 days, vertigo, ears ringing, drum beat in earNO HX CA, SXHX DM,HTN EXAM: CT HEAD WITHOUT CONTRAST TECHNIQUE: Contiguous axial images were obtained from the base of the skull through the vertex without intravenous contrast. COMPARISON:  10/11/2010 FINDINGS: Brain: No evidence of acute infarction, hemorrhage, hydrocephalus, extra-axial collection or mass lesion/mass effect. Vascular: No hyperdense vessel or unexpected calcification. Skull: Normal. Negative for fracture or focal lesion. Sinuses/Orbits: Globes orbits are unremarkable. Visualized sinuses and mastoid air cells are clear. Other: None. IMPRESSION: 1.  Normal exam. Electronically Signed   By: Lajean Manes M.D.   On: 05/31/2016 09:04    Assessment & Plan:   Delanda was seen today for anemia, hyperlipidemia, diabetes and back pain.  Diagnoses and all orders for this visit:  Type 2 diabetes mellitus with complication, without long-term current use of insulin ([place])- Her A1c is at 6.4%.  Her blood sugars are adequately well controlled. -     Hemoglobin A1c; Future  Essential hypertension- Her blood pressure is well controlled.  Electrolytes and renal function are normal. -     Basic metabolic panel; Future  Anemia due to acquired thiamine deficiency- Her H&H have not improved much.  I will recheck her vitamin B1 level to see that this has been adequately replaced. -     CBC with Differential/Platelet; Future -     Vitamin B1; Future  Anemia, unspecified type- She remains mildly anemic despite evaluation and treatment for vitamin deficiencies.  I will check her for hemoglobinopathies. -     CBC with Differential/Platelet; Future -     Cancel: Hemoglobinopathy evaluation; Future -     Hemoglobinopathy evaluation;  Future  Left lumbar radiculitis- She has low back pain with some radicular symptoms but on exam she is neurologically intact.  I am concerned she might have a disc herniation.  Will upgrade her anti-inflammatory therapy to etodolic and will add a course of methylprednisolone as well.  She is allergic to muscle relaxers. -     Ambulatory referral to Physical Therapy -     methylPREDNISolone (MEDROL DOSEPAK) 4 MG TBPK tablet; TAKE AS DIRECTED -     etodolac (LODINE XL) 500 MG 24 hr tablet; Take 1 tablet (500 mg total) by mouth daily.   I am having [name] start on methylPREDNISolone and etodolac. I am also having her maintain her aspirin, glucose blood, cetirizine, glucose blood, ONETOUCH VERIO IQ SYSTEM, Suvorexant, fluticasone, clonazePAM, labetalol, linaclotide, thiamine, cloNIDine, triamterene-hydrochlorothiazide, liraglutide, omega-3 acid ethyl esters, telmisartan, simvastatin, Lorcaserin HCl ER, omeprazole, and Insulin Pen Needle.  Meds ordered this encounter  Medications  . methylPREDNISolone (MEDROL DOSEPAK) 4 MG TBPK tablet    Sig: TAKE AS DIRECTED    Dispense:  21 tablet    Refill:  0  . etodolac (LODINE XL) 500 MG 24 hr tablet    Sig: Take 1 tablet (500 mg total) by mouth daily.    Dispense:  90 tablet    Refill:  0     Follow-up: Return in about 4 weeks (around 07/07/2017).  Scarlette Calico, MD

## 2017-06-09 NOTE — Patient Instructions (Signed)

## 2017-06-11 LAB — HEMOGLOBINOPATHY EVALUATION
HEMOGLOBIN F QUANTITATION: 0 % (ref 0.0–2.0)
HGB A: 97.8 % (ref 96.4–98.8)
HGB C: 0 %
HGB S: 0 %
HGB VARIANT: 0 %
Hemoglobin A2 Quantitation: 2.2 % (ref 1.8–3.2)

## 2017-06-12 ENCOUNTER — Other Ambulatory Visit: Payer: Self-pay | Admitting: Cardiovascular Disease

## 2017-06-13 LAB — VITAMIN B6: Vitamin B6: 22.1 ng/mL — ABNORMAL HIGH (ref 2.1–21.7)

## 2017-06-13 LAB — ZINC: Zinc: 82 ug/dL (ref 60–130)

## 2017-06-14 ENCOUNTER — Encounter: Payer: Self-pay | Admitting: Internal Medicine

## 2017-06-14 LAB — VITAMIN B1: VITAMIN B1 (THIAMINE): 41 nmol/L — AB (ref 8–30)

## 2017-06-25 ENCOUNTER — Other Ambulatory Visit: Payer: Self-pay

## 2017-06-25 ENCOUNTER — Encounter: Payer: Self-pay | Admitting: Physical Therapy

## 2017-06-25 ENCOUNTER — Ambulatory Visit: Payer: 59 | Attending: Internal Medicine | Admitting: Physical Therapy

## 2017-06-25 DIAGNOSIS — R293 Abnormal posture: Secondary | ICD-10-CM | POA: Diagnosis present

## 2017-06-25 DIAGNOSIS — M6281 Muscle weakness (generalized): Secondary | ICD-10-CM | POA: Insufficient documentation

## 2017-06-25 DIAGNOSIS — M545 Low back pain, unspecified: Secondary | ICD-10-CM

## 2017-06-25 NOTE — Patient Instructions (Signed)
   LUMBAR ROLL  Use a small lumbar roll in chairs you sit at frequently. Place the roll at the lower curve of your back. This will help you maintain better posture.    [facility] [address], [place], [place] [address] Phone # [phone] Fax [phone]

## 2017-06-27 NOTE — Therapy (Signed)
[facility] [address] [place], [place], [address] Phone: [phone]   Fax:  [phone]  Physical Therapy Evaluation  Patient Details  Name: Carolyn [name] MRN: [id number] Date of Birth: Jun 30, 1960 Referring Provider: Scarlette Calico, MD    Encounter Date: 06/25/2017  PT End of Session - 06/26/17 1714    Visit Number  1    Date for PT Re-Evaluation  08/01/17    Authorization Type  [employer]     Authorization Time Period  06/25/17 to 08/01/17    PT Start Time  1618    PT Stop Time  1700    PT Time Calculation (min)  42 min    Activity Tolerance  Patient tolerated treatment well;No increased pain    Behavior During Therapy  WFL for tasks assessed/performed       Past Medical History:  Diagnosis Date  . Allergy    seasonal  . Anemia   . Anxiety attack   . Arthritis   . Benign fundic gland polyps of stomach   . Dyslipidemia   . Exertional shortness of breath   . GERD (gastroesophageal reflux disease)   . Heart murmur   . Hyperlipidemia   . Hypertension   . Migraines    "probably weekly" (01/14/2013)  . Type II diabetes mellitus ([place])     Past Surgical History:  Procedure Laterality Date  . CHOLECYSTECTOMY  1990  . COLONOSCOPY    . ENDOMETRIAL ABLATION  ~ 2000-2002   "twice" (01/14/2013)  . LEFT HEART CATHETERIZATION WITH CORONARY ANGIOGRAM N/A 01/15/2013   Procedure: LEFT HEART CATHETERIZATION WITH CORONARY ANGIOGRAM;  Surgeon: Blane Ohara, MD;  Location: [facility] CATH LAB;  Service: Cardiovascular;  Laterality: N/A;  . LEFT OOPHORECTOMY Left ~ 2003  . ORIF TIBIA & FIBULA FRACTURES Left 2003  . VAGINAL HYSTERECTOMY  ~ 2003    There were no vitals filed for this visit.       [facility] PT Assessment - 06/27/17 0001      Assessment   Medical Diagnosis  Lt lumbar radiculitis    Referring Provider  Scarlette Calico, MD     Prior Therapy  none       Precautions   Precautions  None      Balance  Screen   Has the patient fallen in the past 6 months  No    Has the patient had a decrease in activity level because of a fear of falling?   No    Is the patient reluctant to leave their home because of a fear of falling?   No      Prior Function   Vocation  Works at home    [employer]  12 hours a day      Cognition   Overall Cognitive Status  Within Functional Limits for tasks assessed      Observation/Other Assessments   Focus on Therapeutic Outcomes (FOTO)   18% limited       Sensation   Additional Comments  denies any numbness and tingling       Posture/Postural Control   Posture Comments  sitting slouched to either side, forward head and rounded shoulders       AROM   Lumbar Flexion  WNL, pain free     Lumbar Extension  pain end range, hinging low lumbar spine     Lumbar - Right Rotation  WNL, pain end range on Lt low back  Lumbar - Left Rotation  WNL, pain end range on Lt low back       Strength   Right Hip Flexion  3/5    Right Hip Extension  4/5    Right Hip ABduction  4/5    Left Hip Flexion  3/5    Left Hip Extension  4/5    Left Hip ABduction  4/5    Right Knee Extension  5/5    Left Knee Extension  5/5    Right Ankle Dorsiflexion  5/5    Left Ankle Dorsiflexion  5/5      Flexibility   Soft Tissue Assessment /Muscle Length  yes    Hamstrings  WNL    Quadriceps  90 deg bilaterally     Piriformis  25% limited       Palpation   Palpation comment  muscle spasm lumbar paraspinals Lt>Rt              Objective measurements completed on examination: See above findings.      [facility] Adult PT Treatment/Exercise - 06/27/17 0001      Self-Care   Self-Care  Posture    Posture  set up and use of lumbar roll for seated support; benefits of taking 5 minute stretch breaks every hour at home during work              PT Education - 06/26/17 1712    Education provided  Yes    Education Details  eval findings/POC; set up and use of lumbar  roll for additional support while working at home; benefits of taking a 5 minute break every hour while working at her desk     [employer]) Educated  Patient    Methods  Explanation;Handout;Demonstration    Comprehension  Verbalized understanding;Returned demonstration       PT Short Term Goals - 06/26/17 1716      PT SHORT TERM GOAL #1   Title  Pt will demo independence and consistency with her initial HEP to improve strength, flexibility and posture awareness.     Time  2    Period  Weeks    Status  New    Target Date  07/09/17        PT Long Term Goals - 06/27/17 0710      PT LONG TERM GOAL #1   Title  Pt will demo improved BLE strength to atleast 4/5 MMT which will improve her stability with daily walking and other activity.     Time  4    Period  Weeks    Status  New    Target Date  07/25/17      PT LONG TERM GOAL #2   Title  Pt will return to walking 2 miles atleast 4 days a week in order increase her daily activity and decrease low back pain.    Time  4    Period  Weeks    Status  New      PT LONG TERM GOAL #3   Title  Pt will report atleast 75% resolution of her symptoms from the start of therapy which will allow her to complete her work tasks without as much difficulty.     Time  4    Period  Weeks    Status  New      PT LONG TERM GOAL #4   Title  Pt will demo improved quadriceps flexibility to atleast 100 deg in prone testing position, which will assist  with improving her standing posture throughout the day.    Time  4    Period  Weeks    Status  New             Plan - 06/27/17 0715    Clinical Impression Statement  Pt is a 57 y.o F referred to OPPT with complaints midline low back pain, often worse on the Lt side. Pain was onset less than 4 weeks ago, and has remained unchanged. Pt typically works from home, 12 hours a day and takes minimal breaks. This has likely contributed to her back fatigue and discomfort which is easily decreased once she gets up  and moves around. Pt presents poor posture awareness, decreased trunk and hip strength, in addition to limitations in hip flexor flexibility which is all likely contributing to her pain and fatigue throughout the day. She has palpable tenderness along the lumbar paraspinals Lt>Rt and would benefit from skilled PT to address her pain/spasm and other limitations in strength, flexibility and ROM to improve her awareness of posture and allow her to complete her daily work tasks without as much difficulty.     Clinical Presentation  Stable    Clinical Presentation due to:  no worse since onset    Clinical Decision Making  Low    Rehab Potential  Good    PT Frequency  2x / week    PT Duration  4 weeks    PT Treatment/Interventions  ADLs/Self Care Home Management;Cryotherapy;Electrical Stimulation;Moist Heat;Therapeutic activities;Therapeutic exercise;Neuromuscular re-education;Patient/family education;Manual techniques;Dry needling;Passive range of motion;Taping    PT Next Visit Plan  possible dry needling to lumbar paraspinals; f/u on use of towel roll at home and small breaks every hour; progress hip/trunk strengthening and quadriceps/piriformis flexibility    PT Home Exercise Plan  lumbar towel roll; needs more strengthening next session    Consulted and Agree with Plan of Care  Patient       Patient will benefit from skilled therapeutic intervention in order to improve the following deficits and impairments:  Decreased activity tolerance, Decreased strength, Impaired flexibility, Postural dysfunction, Pain, Improper body mechanics, Decreased range of motion, Increased muscle spasms, Hypomobility, Decreased mobility, Decreased endurance  Visit Diagnosis: Acute bilateral low back pain without sciatica  Muscle weakness (generalized)  Abnormal posture     Problem List Patient Active Problem List   Diagnosis Date Noted  . 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 Anemia  07/11/2016  . 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 Slow transit constipation 10/19/2015  . Hypertriglyceridemia 05/25/2015  . Routine general medical examination at a health care facility 05/25/2015  . Visit for screening mammogram 10/27/2014  . Gastric outlet obstruction 03/09/2014  . Left lumbar radiculitis 10/23/2013  . Obesity (BMI 30-39.9) 09/20/2013  . Decreased cardiac ejection fraction 09/17/2013  . Snoring 07/30/2013  . Undiagnosed cardiac murmurs 07/30/2013  . Panic disorder without agoraphobia with moderate panic attacks 01/19/2013  . Intermediate coronary syndrome, progressive angina 01/14/2013  . Type II diabetes mellitus with manifestations ([place]) 12/02/2012  . GERD (gastroesophageal reflux disease)   . Hyperlipidemia with target LDL less than 100   . Allergic rhinitis 07/22/2010  . Essential hypertension 05/25/2010   7:23 AM,06/27/17 Sherol Dade PT, DPT [facility]  [facility] [address] [place], [place], [address] Phone: [phone]   Fax:  [phone]  Name: Carolyn [name] MRN: [id number] Date of  Birth: 05-15-1960

## 2017-07-01 ENCOUNTER — Encounter: Payer: 59 | Admitting: Physical Therapy

## 2017-07-01 ENCOUNTER — Other Ambulatory Visit: Payer: Self-pay | Admitting: Internal Medicine

## 2017-07-01 ENCOUNTER — Telehealth: Payer: Self-pay | Admitting: Hematology and Oncology

## 2017-07-01 DIAGNOSIS — D649 Anemia, unspecified: Secondary | ICD-10-CM

## 2017-07-01 NOTE — Telephone Encounter (Signed)
Scheduled new heme appt with Dr. Lebron Conners for 3/28 @ 11:00 am.  Pt aware to arrive 30 minutes early.  Letter mailed

## 2017-07-10 ENCOUNTER — Encounter: Payer: 59 | Admitting: Physical Therapy

## 2017-07-10 ENCOUNTER — Encounter: Payer: Self-pay | Admitting: Hematology and Oncology

## 2017-07-10 ENCOUNTER — Inpatient Hospital Stay: Payer: 59

## 2017-07-10 ENCOUNTER — Ambulatory Visit: Payer: 59 | Admitting: Physical Therapy

## 2017-07-10 ENCOUNTER — Inpatient Hospital Stay: Payer: 59 | Attending: Hematology and Oncology | Admitting: Hematology and Oncology

## 2017-07-10 ENCOUNTER — Telehealth: Payer: Self-pay

## 2017-07-10 ENCOUNTER — Encounter: Payer: Self-pay | Admitting: Physical Therapy

## 2017-07-10 ENCOUNTER — Other Ambulatory Visit: Payer: Self-pay | Admitting: Cardiovascular Disease

## 2017-07-10 VITALS — BP 127/72 | HR 84 | Temp 98.6°F | Resp 20 | Ht 65.0 in | Wt 188.2 lb

## 2017-07-10 DIAGNOSIS — D649 Anemia, unspecified: Secondary | ICD-10-CM | POA: Diagnosis not present

## 2017-07-10 DIAGNOSIS — I10 Essential (primary) hypertension: Secondary | ICD-10-CM | POA: Diagnosis not present

## 2017-07-10 DIAGNOSIS — E519 Thiamine deficiency, unspecified: Secondary | ICD-10-CM | POA: Diagnosis not present

## 2017-07-10 DIAGNOSIS — D538 Other specified nutritional anemias: Secondary | ICD-10-CM

## 2017-07-10 DIAGNOSIS — M545 Low back pain, unspecified: Secondary | ICD-10-CM

## 2017-07-10 DIAGNOSIS — M6281 Muscle weakness (generalized): Secondary | ICD-10-CM

## 2017-07-10 DIAGNOSIS — E119 Type 2 diabetes mellitus without complications: Secondary | ICD-10-CM | POA: Insufficient documentation

## 2017-07-10 DIAGNOSIS — R293 Abnormal posture: Secondary | ICD-10-CM

## 2017-07-10 LAB — CMP (CANCER CENTER ONLY)
ALBUMIN: 4.3 g/dL (ref 3.5–5.0)
ALK PHOS: 73 U/L (ref 40–150)
ALT: 34 U/L (ref 0–55)
ANION GAP: 13 — AB (ref 3–11)
AST: 15 U/L (ref 5–34)
BUN: 30 mg/dL — AB (ref 7–26)
CALCIUM: 9.8 mg/dL (ref 8.4–10.4)
CO2: 23 mmol/L (ref 22–29)
CREATININE: 1.28 mg/dL — AB (ref 0.60–1.10)
Chloride: 104 mmol/L (ref 98–109)
GFR, Est AFR Am: 54 mL/min — ABNORMAL LOW (ref >60)
GFR, Estimated: 46 mL/min — ABNORMAL LOW (ref >60)
GLUCOSE: 103 mg/dL (ref 70–140)
Potassium: 3.9 mmol/L (ref 3.5–5.1)
SODIUM: 140 mmol/L (ref 136–145)
Total Bilirubin: 0.5 mg/dL (ref 0.2–1.2)
Total Protein: 8.1 g/dL (ref 6.4–8.3)

## 2017-07-10 LAB — CBC WITH DIFFERENTIAL (CANCER CENTER ONLY)
BASOS ABS: 0 10*3/uL (ref 0.0–0.1)
Basophils Relative: 0 %
EOS PCT: 7 %
Eosinophils Absolute: 0.5 10*3/uL (ref 0.0–0.5)
HCT: 31.2 % — ABNORMAL LOW (ref 34.8–46.6)
Hemoglobin: 10.4 g/dL — ABNORMAL LOW (ref 11.6–15.9)
Lymphocytes Relative: 32 %
Lymphs Abs: 2.5 10*3/uL (ref 0.9–3.3)
MCH: 29.6 pg (ref 25.1–34.0)
MCHC: 33.2 g/dL (ref 31.5–36.0)
MCV: 89.1 fL (ref 79.5–101.0)
MONO ABS: 0.5 10*3/uL (ref 0.1–0.9)
MONOS PCT: 6 %
Neutro Abs: 4.3 10*3/uL (ref 1.5–6.5)
Neutrophils Relative %: 55 %
PLATELETS: 308 10*3/uL (ref 145–400)
RBC: 3.5 MIL/uL — ABNORMAL LOW (ref 3.70–5.45)
RDW: 13.4 % (ref 11.2–14.5)
WBC Count: 7.8 10*3/uL (ref 3.9–10.3)

## 2017-07-10 LAB — LACTATE DEHYDROGENASE: LDH: 120 U/L — ABNORMAL LOW (ref 125–245)

## 2017-07-10 LAB — SEDIMENTATION RATE: Sed Rate: 45 mm/hr — ABNORMAL HIGH (ref 0–22)

## 2017-07-10 LAB — C-REACTIVE PROTEIN: CRP: <0.8 mg/dL (ref <1.0)

## 2017-07-10 LAB — TSH: TSH: 1.989 u[IU]/mL (ref 0.308–3.960)

## 2017-07-10 NOTE — Therapy (Addendum)
[facility] [address] [place], [place], [address] Phone: [phone]   Fax:  [phone]  Physical Therapy Treatment  Patient Details  Name: Carolyn [name] MRN: [id number] Date of Birth: 1960/05/03 Referring Provider: Scarlette Calico, MD    Encounter Date: 07/10/2017  PT End of Session - 07/10/17 1655    Visit Number  2    Date for PT Re-Evaluation  08/01/17    Authorization Type  [employer]     Authorization Time Period  06/25/17 to 08/01/17    PT Start Time  1620    PT Stop Time  1703    PT Time Calculation (min)  43 min    Activity Tolerance  Patient tolerated treatment well       Past Medical History:  Diagnosis Date  . Allergy    seasonal  . Anemia   . Anxiety attack   . Arthritis   . Benign fundic gland polyps of stomach   . Dyslipidemia   . Exertional shortness of breath   . GERD (gastroesophageal reflux disease)   . Heart murmur   . Hyperlipidemia   . Hypertension   . Migraines    "probably weekly" (01/14/2013)  . Type II diabetes mellitus ([place])     Past Surgical History:  Procedure Laterality Date  . CHOLECYSTECTOMY  1990  . COLONOSCOPY    . ENDOMETRIAL ABLATION  ~ 2000-2002   "twice" (01/14/2013)  . LEFT HEART CATHETERIZATION WITH CORONARY ANGIOGRAM N/A 01/15/2013   Procedure: LEFT HEART CATHETERIZATION WITH CORONARY ANGIOGRAM;  Surgeon: Blane Ohara, MD;  Location: [facility] CATH LAB;  Service: Cardiovascular;  Laterality: N/A;  . LEFT OOPHORECTOMY Left ~ 2003  . ORIF TIBIA & FIBULA FRACTURES Left 2003  . VAGINAL HYSTERECTOMY  ~ 2003    There were no vitals filed for this visit.  Subjective Assessment - 07/10/17 1620    Subjective  I've been doing better since using the towel roll and walking often.      Pertinent History  cancer, HTN, anxiety, arthritis, migraines, HLD, DM2    Currently in Pain?  No/denies    Pain Score  0-No pain    Pain Location  Back    Pain Orientation   Lower    Pain Type  Acute pain                No data recorded       [facility] Adult PT Treatment/Exercise - 07/10/17 0001      Lumbar Exercises: Stretches   Active Hamstring Stretch  Right;Left;2 reps;20 seconds    Active Hamstring Stretch Limitations  supine with strap    Single Knee to Chest Stretch  Right;Left;2 reps;20 seconds    Lower Trunk Rotation  2 reps;20 seconds      Lumbar Exercises: Standing   Row  Strengthening;Both;15 reps;Theraband    Theraband Level (Row)  Level 2 (Red)    Shoulder Extension  Strengthening;Both;15 reps;Theraband    Theraband Level (Shoulder Extension)  Level 2 (Red)      Lumbar Exercises: Seated   Other Seated Lumbar Exercises  foam roll push down for abdominal brace 10x      Lumbar Exercises: Supine   Ab Set  10 reps    Isometric Hip Flexion  5 reps      Moist Heat Therapy   Number Minutes Moist Heat  12 Minutes    Moist Heat Location  Lumbar Spine      Electrical  Stimulation   Electrical Stimulation Location  lumbar    Electrical Stimulation Action  IFC    Electrical Stimulation Parameters  8 ma 12 min supine    Electrical Stimulation Goals  Pain             PT Education - 07/10/17 1644    Education provided  Yes    Education Details  HS, lumbar rotation, SKTC; abdominal brace, hand to knee push    Person(s) Educated  Patient    Methods  Explanation;Demonstration;Handout    Comprehension  Returned demonstration;Verbalized understanding       PT Short Term Goals - 06/26/17 1716      PT SHORT TERM GOAL #1   Title  Pt will demo independence and consistency with her initial HEP to improve strength, flexibility and posture awareness.     Time  2    Period  Weeks    Status  New    Target Date  07/09/17        PT Long Term Goals - 06/27/17 0710      PT LONG TERM GOAL #1   Title  Pt will demo improved BLE strength to atleast 4/5 MMT which will improve her stability with daily walking and other activity.      Time  4    Period  Weeks    Status  New    Target Date  07/25/17      PT LONG TERM GOAL #2   Title  Pt will return to walking 2 miles atleast 4 days a week in order increase her daily activity and decrease low back pain.    Time  4    Period  Weeks    Status  New      PT LONG TERM GOAL #3   Title  Pt will report atleast 75% resolution of her symptoms from the start of therapy which will allow her to complete her work tasks without as much difficulty.     Time  4    Period  Weeks    Status  New      PT LONG TERM GOAL #4   Title  Pt will demo improved quadriceps flexibility to atleast 100 deg in prone testing position, which will assist with improving her standing posture throughout the day.    Time  4    Period  Weeks    Status  New            Plan - 07/10/17 1655    Clinical Impression Statement  The patient reports she is feeling much better with improving her sitting posture and taking short walks during the work day.  She is able to participate in low level core strengthening and stretching exercises with minimal pain.  She has difficulty activation transverse abdominus core muscles and needs cues to avoid holding her breath.  Good pain relief with ES/heat.      Rehab Potential  Good    Clinical Impairments Affecting Rehab Potential  reports fearfulness of needles    PT Frequency  2x / week    PT Duration  4 weeks    PT Treatment/Interventions  ADLs/Self Care Home Management;Cryotherapy;Electrical Stimulation;Moist Heat;Therapeutic activities;Therapeutic exercise;Neuromuscular re-education;Patient/family education;Manual techniques;Dry needling;Passive range of motion;Taping    PT Next Visit Plan   progress hip/trunk strengthening and quadriceps/piriformis flexibility;  ES/heat as needed for pain control       Patient will benefit from skilled therapeutic intervention in order to improve the following deficits  and impairments:  Decreased activity tolerance, Decreased  strength, Impaired flexibility, Postural dysfunction, Pain, Improper body mechanics, Decreased range of motion, Increased muscle spasms, Hypomobility, Decreased mobility, Decreased endurance  Visit Diagnosis: Acute bilateral low back pain without sciatica  Muscle weakness (generalized)  Abnormal posture     Problem List Patient Active Problem List   Diagnosis Date Noted  . 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 Anemia 07/11/2016  . 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 Slow transit constipation 10/19/2015  . Hypertriglyceridemia 05/25/2015  . Routine general medical examination at a health care facility 05/25/2015  . Visit for screening mammogram 10/27/2014  . Gastric outlet obstruction 03/09/2014  . Left lumbar radiculitis 10/23/2013  . Obesity (BMI 30-39.9) 09/20/2013  . Decreased cardiac ejection fraction 09/17/2013  . Snoring 07/30/2013  . Undiagnosed cardiac murmurs 07/30/2013  . Panic disorder without agoraphobia with moderate panic attacks 01/19/2013  . Intermediate coronary syndrome, progressive angina 01/14/2013  . Type II diabetes mellitus with manifestations ([place]) 12/02/2012  . GERD (gastroesophageal reflux disease)   . Hyperlipidemia with target LDL less than 100   . Allergic rhinitis 07/22/2010  . Essential hypertension 05/25/2010   Ruben Im, PT 07/10/17 4:59 PM Phone: [phone] Fax: [phone]  Alvera Singh 07/10/2017, 4:59 PM  [facility] [address] [place], [place], [address] Phone: [phone]   Fax:  [phone]  Name: Carolyn [name] MRN: [id number] Date of Birth: 1961/02/21

## 2017-07-10 NOTE — Patient Instructions (Signed)
     Copyright  VHI. All rights reserved.      Copyright  VHI. All rights reserved. Knee to Chest (Flexion)   Pull knee toward chest. Feel stretch in lower back or buttock area. Breathing deeply, Hold _20___ seconds. Repeat with other knee. Repeat __2-3__ times. Do __1__ sessions per day.  http://gt2.exer.us/225   Copyright  VHI. All rights reserved.   Lower Trunk Rotation Stretch   Keeping back flat and feet together, rotate knees to left side. Hold _20___ seconds. Repeat _2-3___ times per set. Do ___1_ sets per session. Do __1__ sessions per day.  http://orth.exer.us/122   Copyright  VHI. All rights reserved.  Supine: Leg Stretch With Strap (Basic)   Lie on back with one knee bent, foot flat on floor. Hook strap around other foot. Straighten knee. Keep knee level with other knee. Hold __20_ seconds. Relax leg completely down to floor.  Repeat _2-3__ times per session. Do __1_ sessions per day.  Copyright  VHI. All rights reserved.    http://gt2.exer.us/247           Ruben Im PT [facility] [address], [place], [place] [address] Phone # [phone] Fax [phone]

## 2017-07-10 NOTE — Telephone Encounter (Signed)
Printed avs and calender of up coming appointment. Per 3/28 los. Lab was added on

## 2017-07-11 LAB — C3 AND C4
C3 Complement: 188 mg/dL — ABNORMAL HIGH (ref 82–167)
Complement C4, Body Fluid: 51 mg/dL — ABNORMAL HIGH (ref 14–44)

## 2017-07-11 LAB — RHEUMATOID FACTOR: Rhuematoid fact SerPl-aCnc: 10.6 IU/mL (ref 0.0–13.9)

## 2017-07-13 LAB — ANTINUCLEAR ANTIBODIES, IFA: ANA Ab, IFA: NEGATIVE

## 2017-07-15 ENCOUNTER — Ambulatory Visit: Payer: 59 | Admitting: Physical Therapy

## 2017-07-15 NOTE — Assessment & Plan Note (Signed)
57 y.o. reasonably healthy female with recent discovery of thiamine deficiency in the setting of mild normocytic normochromic anemia.  Additional labs demonstrate interval correction of thiamine deficiency in absence of any other discernible vitamin B, folate, or iron deficiency without significant anemia improvement.  With that in mind, exact contribution of B1 deficiency to the degree of anemia is unclear.  Plan: -Additional labs to assess for possible presence of anemia of chronic disease versus anemia of inflammatory state as outlined below. -Return to our clinic in 1 week to review the findings.
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
## 2017-07-16 ENCOUNTER — Encounter: Payer: Self-pay | Admitting: Hematology and Oncology

## 2017-07-16 ENCOUNTER — Telehealth: Payer: Self-pay | Admitting: Hematology and Oncology

## 2017-07-16 ENCOUNTER — Inpatient Hospital Stay: Payer: 59 | Attending: Hematology and Oncology | Admitting: Hematology and Oncology

## 2017-07-16 VITALS — HR 85 | Temp 98.1°F | Resp 18 | Wt 192.1 lb

## 2017-07-16 DIAGNOSIS — E119 Type 2 diabetes mellitus without complications: Secondary | ICD-10-CM | POA: Diagnosis not present

## 2017-07-16 DIAGNOSIS — M545 Low back pain: Secondary | ICD-10-CM | POA: Diagnosis not present

## 2017-07-16 DIAGNOSIS — E519 Thiamine deficiency, unspecified: Secondary | ICD-10-CM | POA: Diagnosis not present

## 2017-07-16 DIAGNOSIS — I10 Essential (primary) hypertension: Secondary | ICD-10-CM | POA: Diagnosis not present

## 2017-07-16 DIAGNOSIS — D649 Anemia, unspecified: Secondary | ICD-10-CM | POA: Diagnosis present

## 2017-07-16 DIAGNOSIS — R5383 Other fatigue: Secondary | ICD-10-CM | POA: Diagnosis not present

## 2017-07-16 DIAGNOSIS — R252 Cramp and spasm: Secondary | ICD-10-CM | POA: Insufficient documentation

## 2017-07-16 DIAGNOSIS — D638 Anemia in other chronic diseases classified elsewhere: Secondary | ICD-10-CM

## 2017-07-16 DIAGNOSIS — D538 Other specified nutritional anemias: Secondary | ICD-10-CM

## 2017-07-16 DIAGNOSIS — D519 Vitamin B12 deficiency anemia, unspecified: Secondary | ICD-10-CM | POA: Insufficient documentation

## 2017-07-16 NOTE — Progress Notes (Signed)
Patient ID: Carolyn [name], female   DOB: 1960/04/19, 57 y.o.   MRN: [id number]   57 y.o. with HTN, OSA, Anemia. No CAD by cath 2014. Echo 2015 had EF 50% but f/u echo 08/21/15 showed EF 55-60% with trivial AR   Last visit started on labatolol  For better BP control  Doing well Still having some GI issues with dysmotility  Sees Carlean Purl Could not tolerate Reglan Some issues with anxiety Still being w/u for anemia   Two older sons  Divorced from husband of 21 years  Carolyn [name] is most concerned about her anemia Carolyn [name] denies any cardiac complaints   ROS: Denies fever, malais, weight loss, blurry vision, decreased visual acuity, cough, sputum, SOB, hemoptysis, pleuritic pain, palpitaitons, heartburn, abdominal pain, melena, lower extremity edema, claudication, or rash.  All other systems reviewed and negative  General: There were no vitals taken for this visit. Affect appropriate Healthy:  appears stated age 89: normal Neck supple with no adenopathy JVP normal no bruits no thyromegaly Lungs clear with no wheezing and good diaphragmatic motion Heart:  S1/S2 no murmur, no rub, gallop or click PMI normal Abdomen: benighn, BS positve, no tenderness, no AAA no bruit.  No HSM or HJR Distal pulses intact with no bruits No edema Neuro non-focal Skin warm and dry No muscular weakness    Current Outpatient Medications  Medication Sig Dispense Refill  . aspirin EC 81 MG EC tablet Take 1 tablet (81 mg total) by mouth daily.    . Blood Glucose Monitoring Suppl (ONETOUCH VERIO IQ SYSTEM) W/DEVICE KIT 1 Act by Does not apply route 3 (three) times daily. 2 kit 0  . cetirizine (ZYRTEC) 10 MG chewable tablet Chew 10 mg by mouth daily.    . clonazePAM (KLONOPIN) 0.5 MG tablet Take 1 tablet (0.5 mg total) by mouth 2 (two) times daily as needed for anxiety. 20 tablet 2  . cloNIDine (CATAPRES) 0.1 MG tablet TAKE 2 TABLETS BY MOUTH ONCE DAILY 60 tablet 5  . etodolac (LODINE XL) 500 MG 24 hr tablet Take 1 tablet  (500 mg total) by mouth daily. 90 tablet 0  . fluticasone (FLONASE) 50 MCG/ACT nasal spray Place 2 sprays into both nostrils daily. 48 g 3  . glucose blood (ONE TOUCH ULTRA TEST) test strip Use to test blood sugar three times a week and prn if having symptoms of low blood sugar Dx: 250.92 90 day supply 100 each 11  . glucose blood (ONETOUCH VERIO) test strip Use TID 100 each 12  . Insulin Pen Needle (NOVOFINE) 32G X 6 MM MISC 1 Act by Does not apply route daily. 100 each 3  . labetalol (NORMODYNE) 300 MG tablet Take 1 tablet (300 mg total) by mouth 2 (two) times daily. 180 tablet 3  . linaclotide (LINZESS) 290 MCG CAPS capsule Take 1 capsule (290 mcg total) by mouth daily before breakfast. 90 capsule 1  . liraglutide (VICTOZA) 18 MG/3ML SOPN Inject 0.3 mLs (1.8 mg total) into the skin daily. 18 pen 1  . Lorcaserin HCl ER (BELVIQ XR) 20 MG TB24 Take 1 tablet by mouth daily. 30 tablet 5  . methylPREDNISolone (MEDROL DOSEPAK) 4 MG TBPK tablet TAKE AS DIRECTED 21 tablet 0  . omega-3 acid ethyl esters (LOVAZA) 1 g capsule Take 2 capsules (2 g total) by mouth 2 (two) times daily. 360 capsule 1  . omeprazole (PRILOSEC) 40 MG capsule TAKE ONE CAPSULE BY MOUTH TWICE DAILY AT 8 AM AND 10 PM 180 capsule 1  .  simvastatin (ZOCOR) 40 MG tablet Take 1 tablet (40 mg total) by mouth at bedtime. 90 tablet 3  . Suvorexant (BELSOMRA) 15 MG TABS Take 1 tablet by mouth at bedtime as needed. 30 tablet 5  . telmisartan (MICARDIS) 80 MG tablet Take 1 tablet (80 mg total) by mouth daily. 90 tablet 1  . thiamine (VITAMIN B-1) 100 MG tablet Take 1 tablet (100 mg total) by mouth daily. 90 tablet 3  . triamterene-hydrochlorothiazide (DYAZIDE) 37.5-25 MG capsule TAKE 1 CAPSULE BY MOUTH ONCE DAILY 30 capsule 5   No current facility-administered medications for this visit.     Allergies  Metformin and related; Invokana [canagliflozin]; Codeine; Flexeril [cyclobenzaprine]; Morphine and related; and Reglan  [metoclopramide]  Electrocardiogram:  SR LVH  11/22/13 07/18/17 NSR rate 88 nonspecific ST changes   Assessment and Plan DCM: improved EF on last echo  Will update make sure EF remains normal  HTN: Well controlled.  Continue current medications and low sodium Dash type diet.   Anemia f/u primary last Hct 31.2 07/10/17 Did not improve with Thiamine being w/u more for anemia of chronic dx or or inflammatory state Dysmotility fiu GI DM: Discussed low carb diet.  Target hemoglobin A1c is 6.5 or less.  Continue current medications. Carolyn [name] is off invokana and last A1c was 6.0  Chol:  LDL 78 normal LFT;s continue zocor   [employer]

## 2017-07-16 NOTE — Telephone Encounter (Signed)
Scheduled appt per 4/3 los - Gave patient AVS and calender per los.  

## 2017-07-17 ENCOUNTER — Encounter: Payer: Self-pay | Admitting: Physical Therapy

## 2017-07-17 ENCOUNTER — Ambulatory Visit: Payer: 59 | Attending: Internal Medicine | Admitting: Physical Therapy

## 2017-07-17 ENCOUNTER — Other Ambulatory Visit: Payer: Self-pay | Admitting: Obstetrics and Gynecology

## 2017-07-17 ENCOUNTER — Inpatient Hospital Stay: Payer: 59

## 2017-07-17 DIAGNOSIS — E519 Thiamine deficiency, unspecified: Secondary | ICD-10-CM

## 2017-07-17 DIAGNOSIS — R293 Abnormal posture: Secondary | ICD-10-CM | POA: Diagnosis present

## 2017-07-17 DIAGNOSIS — M545 Low back pain, unspecified: Secondary | ICD-10-CM

## 2017-07-17 DIAGNOSIS — D538 Other specified nutritional anemias: Secondary | ICD-10-CM

## 2017-07-17 DIAGNOSIS — M6281 Muscle weakness (generalized): Secondary | ICD-10-CM | POA: Insufficient documentation

## 2017-07-17 DIAGNOSIS — D649 Anemia, unspecified: Principal | ICD-10-CM

## 2017-07-17 DIAGNOSIS — D638 Anemia in other chronic diseases classified elsewhere: Secondary | ICD-10-CM | POA: Diagnosis not present

## 2017-07-17 DIAGNOSIS — Z1231 Encounter for screening mammogram for malignant neoplasm of breast: Secondary | ICD-10-CM

## 2017-07-17 LAB — BASIC METABOLIC PANEL - CANCER CENTER ONLY
ANION GAP: 13 — AB (ref 3–11)
BUN: 16 mg/dL (ref 7–26)
CALCIUM: 10.3 mg/dL (ref 8.4–10.4)
CO2: 24 mmol/L (ref 22–29)
Chloride: 104 mmol/L (ref 98–109)
Creatinine: 1.15 mg/dL — ABNORMAL HIGH (ref 0.60–1.10)
GFR, EST NON AFRICAN AMERICAN: 53 mL/min — AB (ref >60)
GLUCOSE: 136 mg/dL (ref 70–140)
Potassium: 3.9 mmol/L (ref 3.5–5.1)
Sodium: 141 mmol/L (ref 136–145)

## 2017-07-17 NOTE — Patient Instructions (Signed)
  Access Code: [email or web address]  URL: [email or web address]  Date: 07/17/2017  Prepared by: Ruben Im   Exercises  Supine Bridge - 10 reps - 1 sets - 1x daily - 7x weekly  Standing Hamstring Stretch with Step - 3 reps - 1 sets - 20 hold - 1x daily - 7x weekly  Quadruped Rocking Slow - 10 reps - 1 sets - 1x daily - 7x weekly      [facility] [address] [place], [place] [address] Phone # [phone] Fax [phone]

## 2017-07-17 NOTE — Therapy (Addendum)
[facility] [address], [place], [place], [address] Phone: [phone]   Fax:  [phone]  Physical Therapy Treatment  Patient Details  Name: Carolyn [name] MRN: [id number] Date of Birth: 04-21-1960 Referring Provider: Scarlette Calico, MD    Encounter Date: 07/17/2017  PT End of Session - 07/17/17 1646    Visit Number  3    Date for PT Re-Evaluation  08/01/17    Authorization Type  [employer]     Authorization Time Period  06/25/17 to 08/01/17    PT Start Time  1616    PT Stop Time  1654    PT Time Calculation (min)  38 min    Activity Tolerance  Patient tolerated treatment well       Past Medical History:  Diagnosis Date  . Allergy    seasonal  . Anemia   . Anxiety attack   . Arthritis   . Benign fundic gland polyps of stomach   . Dyslipidemia   . Exertional shortness of breath   . GERD (gastroesophageal reflux disease)   . Heart murmur   . Hyperlipidemia   . Hypertension   . Migraines    "probably weekly" (01/14/2013)  . Type II diabetes mellitus ([place])     Past Surgical History:  Procedure Laterality Date  . CHOLECYSTECTOMY  1990  . COLONOSCOPY    . ENDOMETRIAL ABLATION  ~ 2000-2002   "twice" (01/14/2013)  . LEFT HEART CATHETERIZATION WITH CORONARY ANGIOGRAM N/A 01/15/2013   Procedure: LEFT HEART CATHETERIZATION WITH CORONARY ANGIOGRAM;  Surgeon: Blane Ohara, MD;  Location: [facility] CATH LAB;  Service: Cardiovascular;  Laterality: N/A;  . LEFT OOPHORECTOMY Left ~ 2003  . ORIF TIBIA & FIBULA FRACTURES Left 2003  . VAGINAL HYSTERECTOMY  ~ 2003    There were no vitals filed for this visit.  Subjective Assessment - 07/17/17 1619    Subjective  This morning had a lot of right sided back pain but a little better now.  Still considering home TENs unit since it helped a lot last time.  Been doing a lot of sitting today.      Pertinent History  cancer, HTN, anxiety, arthritis, migraines, HLD, DM2     Currently in Pain?  Yes    Pain Score  5     Pain Location  Back    Pain Orientation  Right    Pain Type  Acute pain    Aggravating Factors   sitting                       [facility] Adult PT Treatment/Exercise - 07/17/17 0001      Lumbar Exercises: Stretches   Active Hamstring Stretch  Right;Left;5 reps    Active Hamstring Stretch Limitations  standing on 2 nd step     Other Lumbar Stretch Exercise  psoas doorway 3x5       Lumbar Exercises: Supine   Bridge  10 reps with green ball    Large Ball Abdominal Isometric  10 reps    Large Ball Oblique Isometric  10 reps    Other Supine Lumbar Exercises  lumbar rotation with green ball 10x each way     Other Supine Lumbar Exercises  piriformis stretch with green ball 5x right /left       Lumbar Exercises: Quadruped   Other Quadruped Lumbar Exercises  rocks on elbows/knees straight and angled 5x each      Moist  Heat Therapy   Number Minutes Moist Heat  12 Minutes    Moist Heat Location  Lumbar Spine      Electrical Stimulation   Electrical Stimulation Location  lumbar    Electrical Stimulation Action  IFC    Electrical Stimulation Parameters  10 ma 12 min supine    Electrical Stimulation Goals  Pain             PT Education - 07/17/17 1645    Education provided  Yes    Education Details  quadruped rocks, bridge, standing HS stretch    Person(s) Educated  Patient    Methods  Explanation;Demonstration;Handout    Comprehension  Returned demonstration;Verbalized understanding       PT Short Term Goals - 07/17/17 1649      PT SHORT TERM GOAL #1   Title  Pt will demo independence and consistency with her initial HEP to improve strength, flexibility and posture awareness.     Status  Achieved        PT Long Term Goals - 06/27/17 0710      PT LONG TERM GOAL #1   Title  Pt will demo improved BLE strength to atleast 4/5 MMT which will improve her stability with daily walking and other activity.     Time  4     Period  Weeks    Status  New    Target Date  07/25/17      PT LONG TERM GOAL #2   Title  Pt will return to walking 2 miles atleast 4 days a week in order increase her daily activity and decrease low back pain.    Time  4    Period  Weeks    Status  New      PT LONG TERM GOAL #3   Title  Pt will report atleast 75% resolution of her symptoms from the start of therapy which will allow her to complete her work tasks without as much difficulty.     Time  4    Period  Weeks    Status  New      PT LONG TERM GOAL #4   Title  Pt will demo improved quadriceps flexibility to atleast 100 deg in prone testing position, which will assist with improving her standing posture throughout the day.    Time  4    Period  Weeks    Status  New            Plan - 07/17/17 1646    Clinical Impression Statement  The patient is able to perform lumbar mobility and core stabilization exercises reporting that all "feel good".  Quadruped ex modified to elbow instead of hands secondary to wrist pain.  Good pain relief with ES/heat.      Rehab Potential  Good    Clinical Impairments Affecting Rehab Potential  reports fearfulness of needles    PT Frequency  2x / week    PT Duration  4 weeks    PT Treatment/Interventions  ADLs/Self Care Home Management;Cryotherapy;Electrical Stimulation;Moist Heat;Therapeutic activities;Therapeutic exercise;Neuromuscular re-education;Patient/family education;Manual techniques;Dry needling;Passive range of motion;Taping    PT Next Visit Plan   upcoming ERO per insurance authorization;  reassess progress toward goals;  progress hip/trunk strengthening and quadriceps/piriformis flexibility;  ES/heat as needed for pain control       Patient will benefit from skilled therapeutic intervention in order to improve the following deficits and impairments:  Decreased activity tolerance, Decreased strength, Impaired flexibility, Postural  dysfunction, Pain, Improper body mechanics,  Decreased range of motion, Increased muscle spasms, Hypomobility, Decreased mobility, Decreased endurance  Visit Diagnosis: Acute bilateral low back pain without sciatica  Muscle weakness (generalized)  Abnormal posture     Problem List Patient Active Problem List   Diagnosis Date Noted  . 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 Anemia 07/11/2016  . 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 Slow transit constipation 10/19/2015  . Hypertriglyceridemia 05/25/2015  . Routine general medical examination at a health care facility 05/25/2015  . Visit for screening mammogram 10/27/2014  . Gastric outlet obstruction 03/09/2014  . Left lumbar radiculitis 10/23/2013  . Obesity (BMI 30-39.9) 09/20/2013  . Decreased cardiac ejection fraction 09/17/2013  . Snoring 07/30/2013  . Undiagnosed cardiac murmurs 07/30/2013  . Panic disorder without agoraphobia with moderate panic attacks 01/19/2013  . Intermediate coronary syndrome, progressive angina 01/14/2013  . Type II diabetes mellitus with manifestations ([place]) 12/02/2012  . GERD (gastroesophageal reflux disease)   . Hyperlipidemia with target LDL less than 100   . Allergic rhinitis 07/22/2010  . Essential hypertension 05/25/2010   Ruben Im, PT 07/17/17 4:57 PM Phone: [phone] Fax: [phone]  Alvera Singh 07/17/2017, 4:57 PM  [facility] [address], [place], [place], [address] Phone: [phone]   Fax:  [phone]  Name: ZANDREA KENEALY MRN: [id number] Date of Birth: November 22, 1960

## 2017-07-18 ENCOUNTER — Other Ambulatory Visit: Payer: Self-pay | Admitting: Internal Medicine

## 2017-07-18 ENCOUNTER — Ambulatory Visit: Payer: 59 | Admitting: Cardiovascular Disease

## 2017-07-18 ENCOUNTER — Telehealth: Payer: Self-pay | Admitting: Internal Medicine

## 2017-07-18 DIAGNOSIS — I420 Dilated cardiomyopathy: Secondary | ICD-10-CM | POA: Diagnosis not present

## 2017-07-18 DIAGNOSIS — E7849 Other hyperlipidemia: Secondary | ICD-10-CM | POA: Diagnosis not present

## 2017-07-18 DIAGNOSIS — I10 Essential (primary) hypertension: Secondary | ICD-10-CM | POA: Diagnosis not present

## 2017-07-18 DIAGNOSIS — I200 Unstable angina: Secondary | ICD-10-CM

## 2017-07-18 MED ORDER — LABETALOL HCL 300 MG PO TABS: 300.0000 mg | ORAL_TABLET | Freq: Two times a day (BID) | ORAL | 3 refills | Status: DC

## 2017-07-18 NOTE — Telephone Encounter (Signed)
It looks like a referral has already been entered. Can you confirm?

## 2017-07-18 NOTE — Telephone Encounter (Signed)
Yes. Carolyn [name] has taken care of everything.

## 2017-07-18 NOTE — Telephone Encounter (Signed)
Copied from [place] [id number]. Topic: Referral - Status >> Jul 18, 2017  9:32 AM Robina Ade, Helene Kelp D wrote: Reason for CRM: Patient called and said that she has an appt today with Cardiology at 2:15 with Dr. Johnsie Cancel and she needs a new referral to be put in for her to see him. Please call patient with any questions.

## 2017-07-18 NOTE — Patient Instructions (Addendum)

## 2017-07-21 LAB — MULTIPLE MYELOMA PANEL, SERUM
ALPHA 1: 0.2 g/dL (ref 0.0–0.4)
ALPHA2 GLOB SERPL ELPH-MCNC: 0.8 g/dL (ref 0.4–1.0)
Albumin SerPl Elph-Mcnc: 3.9 g/dL (ref 2.9–4.4)
Albumin/Glob SerPl: 1.2 (ref 0.7–1.7)
B-Globulin SerPl Elph-Mcnc: 1.3 g/dL (ref 0.7–1.3)
Gamma Glob SerPl Elph-Mcnc: 1.1 g/dL (ref 0.4–1.8)
Globulin, Total: 3.4 g/dL (ref 2.2–3.9)
IGA: 247 mg/dL (ref 87–352)
IGG (IMMUNOGLOBIN G), SERUM: 1133 mg/dL (ref 700–1600)
IGM (IMMUNOGLOBULIN M), SRM: 96 mg/dL (ref 26–217)
Total Protein ELP: 7.3 g/dL (ref 6.0–8.5)

## 2017-07-22 ENCOUNTER — Encounter: Payer: 59 | Admitting: Physical Therapy

## 2017-07-24 ENCOUNTER — Ambulatory Visit: Payer: 59 | Admitting: Physical Therapy

## 2017-07-29 DIAGNOSIS — D638 Anemia in other chronic diseases classified elsewhere: Secondary | ICD-10-CM | POA: Insufficient documentation

## 2017-07-29 NOTE — Assessment & Plan Note (Signed)
57 y.o. reasonably healthy female with recent discovery of thiamine deficiency in the setting of mild normocytic normochromic anemia.  Additional labs demonstrate interval correction of thiamine deficiency in absence of any other discernible vitamin B, folate, or iron deficiency without significant anemia improvement.  With that in mind, exact contribution of B1 deficiency to the degree of anemia is unclear.  Additional lab work revealed elevated inflammatory markers including ESR and complement components 3 and 4.  Additionally, slight elevation of creatinine was noted.  Persistent inflammatory state is likely contributing to anemia by providing resistance to erythropoietin effect on red blood cell precursors.  Plan: -Additional labs as outlined below.  Unless significant abnormal findings are discovered, patient will return to our clinic in 3 months for continued hematological monitoring.
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
## 2017-08-04 ENCOUNTER — Other Ambulatory Visit: Payer: Self-pay

## 2017-08-04 ENCOUNTER — Ambulatory Visit (HOSPITAL_COMMUNITY): Payer: 59 | Attending: Internal Medicine

## 2017-08-04 ENCOUNTER — Ambulatory Visit
Admission: RE | Admit: 2017-08-04 | Discharge: 2017-08-04 | Disposition: A | Discharge status: Home / Self Care | Payer: 59 | Source: Ambulatory Visit | Attending: Obstetrics and Gynecology | Admitting: Obstetrics and Gynecology

## 2017-08-04 DIAGNOSIS — I517 Cardiomegaly: Secondary | ICD-10-CM | POA: Insufficient documentation

## 2017-08-04 DIAGNOSIS — Z1231 Encounter for screening mammogram for malignant neoplasm of breast: Secondary | ICD-10-CM

## 2017-08-04 DIAGNOSIS — I420 Dilated cardiomyopathy: Secondary | ICD-10-CM | POA: Insufficient documentation

## 2017-08-05 ENCOUNTER — Ambulatory Visit (INDEPENDENT_AMBULATORY_CARE_PROVIDER_SITE_OTHER)
Admission: RE | Admit: 2017-08-05 | Discharge: 2017-08-05 | Disposition: A | Discharge status: Home / Self Care | Payer: 59 | Source: Ambulatory Visit | Attending: Internal Medicine | Admitting: Internal Medicine

## 2017-08-05 ENCOUNTER — Encounter: Payer: Self-pay | Admitting: Internal Medicine

## 2017-08-05 ENCOUNTER — Ambulatory Visit (INDEPENDENT_AMBULATORY_CARE_PROVIDER_SITE_OTHER): Payer: 59 | Admitting: Internal Medicine

## 2017-08-05 ENCOUNTER — Other Ambulatory Visit (INDEPENDENT_AMBULATORY_CARE_PROVIDER_SITE_OTHER): Payer: 59

## 2017-08-05 VITALS — BP 124/80 | HR 90 | Temp 98.1°F | Resp 16

## 2017-08-05 DIAGNOSIS — E118 Type 2 diabetes mellitus with unspecified complications: Secondary | ICD-10-CM

## 2017-08-05 DIAGNOSIS — E785 Hyperlipidemia, unspecified: Secondary | ICD-10-CM

## 2017-08-05 DIAGNOSIS — I10 Essential (primary) hypertension: Secondary | ICD-10-CM | POA: Diagnosis not present

## 2017-08-05 DIAGNOSIS — M5416 Radiculopathy, lumbar region: Secondary | ICD-10-CM | POA: Diagnosis not present

## 2017-08-05 LAB — URINALYSIS, ROUTINE W REFLEX MICROSCOPIC
Bilirubin Urine: NEGATIVE
HGB URINE DIPSTICK: NEGATIVE
Ketones, ur: NEGATIVE
Leukocytes, UA: NEGATIVE
Nitrite: NEGATIVE
RBC / HPF: NONE SEEN (ref 0–?)
Specific Gravity, Urine: >=1.03 — AB (ref 1.000–1.030)
TOTAL PROTEIN, URINE-UPE24: 30 — AB
Urine Glucose: NEGATIVE
Urobilinogen, UA: 0.2 (ref 0.0–1.0)
pH: 5.5 (ref 5.0–8.0)

## 2017-08-05 LAB — HM MAMMOGRAPHY

## 2017-08-05 LAB — MICROALBUMIN / CREATININE URINE RATIO
Creatinine,U: 265.7 mg/dL
MICROALB UR: 15.8 mg/dL — AB (ref 0.0–1.9)
MICROALB/CREAT RATIO: 5.9 mg/g (ref 0.0–30.0)

## 2017-08-05 LAB — LIPID PANEL
CHOLESTEROL: 167 mg/dL (ref 0–200)
HDL: 35.5 mg/dL — ABNORMAL LOW (ref >39.00)
NonHDL: 131.75
Total CHOL/HDL Ratio: 5
Triglycerides: 400 mg/dL — ABNORMAL HIGH (ref 0.0–149.0)
VLDL: 80 mg/dL — ABNORMAL HIGH (ref 0.0–40.0)

## 2017-08-05 LAB — LDL CHOLESTEROL, DIRECT: Direct LDL: 73 mg/dL

## 2017-08-05 MED ORDER — METFORMIN HCL ER 750 MG PO TB24: 750.0000 mg | ORAL_TABLET | Freq: Every day | ORAL | 1 refills | Status: DC

## 2017-08-05 NOTE — Progress Notes (Signed)
Subjective:  Patient ID: Carolyn [name], female    DOB: [birth year]  Age: 57 y.o. MRN: [id number]  CC: Back Pain and Diabetes   HPI Carolyn [name] presents for f/up -she complains of chronic, intermittent episodes of low back pain that she describes as a crampy sensation.  There is no radiation into her lower extremities.  She denies paresthesias in her lower extremity.  She gets symptom relief with Motrin.  Outpatient Medications Prior to Visit  Medication Sig Dispense Refill  . Blood Glucose Monitoring Suppl (ONETOUCH VERIO IQ SYSTEM) W/DEVICE KIT 1 Act by Does not apply route 3 (three) times daily. 2 kit 0  . cetirizine (ZYRTEC) 10 MG chewable tablet Chew 10 mg by mouth daily.    . cloNIDine (CATAPRES) 0.1 MG tablet TAKE 2 TABLETS BY MOUTH ONCE DAILY 60 tablet 5  . fluticasone (FLONASE) 50 MCG/ACT nasal spray Place 2 sprays into both nostrils daily. 48 g 3  . glucose blood (ONE TOUCH ULTRA TEST) test strip Use to test blood sugar three times a week and prn if having symptoms of low blood sugar Dx: 250.92 90 day supply 100 each 11  . glucose blood (ONETOUCH VERIO) test strip Use TID 100 each 12  . Insulin Pen Needle (NOVOFINE) 32G X 6 MM MISC 1 Act by Does not apply route daily. 100 each 3  . labetalol (NORMODYNE) 300 MG tablet Take 1 tablet (300 mg total) by mouth 2 (two) times daily. 180 tablet 3  . linaclotide (LINZESS) 290 MCG CAPS capsule Take 1 capsule (290 mcg total) by mouth daily before breakfast. 90 capsule 1  . liraglutide (VICTOZA) 18 MG/3ML SOPN Inject 0.3 mLs (1.8 mg total) into the skin daily. 18 pen 1  . omega-3 acid ethyl esters (LOVAZA) 1 g capsule Take 2 capsules (2 g total) by mouth 2 (two) times daily. 360 capsule 1  . omeprazole (PRILOSEC) 40 MG capsule TAKE ONE CAPSULE BY MOUTH TWICE DAILY AT 8 AM AND 10 PM 180 capsule 1  . simvastatin (ZOCOR) 40 MG tablet Take 1 tablet (40 mg total) by mouth at bedtime. 90 tablet 3  . Suvorexant (BELSOMRA) 15 MG TABS Take 1 tablet  by mouth at bedtime as needed. 30 tablet 5  . telmisartan (MICARDIS) 80 MG tablet Take 1 tablet (80 mg total) by mouth daily. 90 tablet 1  . thiamine (VITAMIN B-1) 100 MG tablet Take 1 tablet (100 mg total) by mouth daily. 90 tablet 3  . triamterene-hydrochlorothiazide (DYAZIDE) 37.5-25 MG capsule TAKE 1 CAPSULE BY MOUTH ONCE DAILY 30 capsule 5  . aspirin EC 81 MG EC tablet Take 1 tablet (81 mg total) by mouth daily.    . clonazePAM (KLONOPIN) 0.5 MG tablet Take 1 tablet (0.5 mg total) by mouth 2 (two) times daily as needed for anxiety. 20 tablet 2  . etodolac (LODINE XL) 500 MG 24 hr tablet Take 1 tablet (500 mg total) by mouth daily. 90 tablet 0  . Lorcaserin HCl ER (BELVIQ XR) 20 MG TB24 Take 1 tablet by mouth daily. 30 tablet 5  . methylPREDNISolone (MEDROL DOSEPAK) 4 MG TBPK tablet TAKE AS DIRECTED 21 tablet 0   No facility-administered medications prior to visit.     ROS Review of Systems  Constitutional: Negative.  Negative for appetite change, diaphoresis, fatigue and unexpected weight change.  HENT: Negative.   Eyes: Negative for visual disturbance.  Respiratory: Negative for cough, chest tightness, shortness of breath and wheezing.   Cardiovascular: Negative  for chest pain, palpitations and leg swelling.  Gastrointestinal: Negative for abdominal pain, constipation, diarrhea, nausea and vomiting.  Endocrine: Negative.   Genitourinary: Negative.  Negative for difficulty urinating.  Musculoskeletal: Positive for back pain. Negative for arthralgias and myalgias.  Skin: Negative.  Negative for color change, pallor and rash.  Allergic/Immunologic: Negative.   Neurological: Negative.  Negative for dizziness, weakness, light-headedness and numbness.  Hematological: Negative for adenopathy. Does not bruise/bleed easily.  Psychiatric/Behavioral: Negative.     Objective:  BP 124/80 (BP Location: Left Arm, Patient Position: Sitting, Cuff Size: Normal)   Pulse 90   Temp 98.1 F (36.7  C)   Resp 16   SpO2 98%   BP Readings from Last 3 Encounters:  08/05/17 124/80  07/10/17 127/72  06/09/17 120/80    Wt Readings from Last 3 Encounters:  07/16/17 192 lb 2 oz (87.1 kg)  07/10/17 188 lb 3.2 oz (85.4 kg)  06/09/17 190 lb (86.2 kg)    Physical Exam  Constitutional: She is oriented to person, place, and time. No distress.  HENT:  Mouth/Throat: Oropharynx is clear and moist. No oropharyngeal exudate.  Eyes: Conjunctivae are normal. No scleral icterus.  Neck: Normal range of motion. Neck supple. No JVD present. No thyromegaly present.  Cardiovascular: Normal rate, regular rhythm and normal heart sounds. Exam reveals no gallop and no friction rub.  No murmur heard. Pulmonary/Chest: Effort normal and breath sounds normal. No stridor. No respiratory distress. She has no wheezes. She has no rales.  Abdominal: Soft. Bowel sounds are normal. She exhibits no distension and no mass. There is no tenderness. No hernia.  Musculoskeletal: Normal range of motion. She exhibits no edema, tenderness or deformity.  Lymphadenopathy:    She has no cervical adenopathy.  Neurological: She is alert and oriented to person, place, and time. She has normal strength. She displays no atrophy and no tremor. No sensory deficit. She exhibits normal muscle tone. She displays a negative Romberg sign. She displays no seizure activity. Coordination and gait normal.  Reflex Scores:      Tricep reflexes are 1+ on the right side and 1+ on the left side.      Bicep reflexes are 1+ on the right side and 1+ on the left side.      Brachioradialis reflexes are 1+ on the right side and 1+ on the left side.      Patellar reflexes are 1+ on the right side and 1+ on the left side.      Achilles reflexes are 0 on the right side and 0 on the left side. Neg SLR in BLE  Skin: Skin is warm. She is not diaphoretic.  Vitals reviewed.   Lab Results  Component Value Date   WBC 7.8 07/10/2017   HGB 10.4 (L)  07/10/2017   HCT 31.2 (L) 07/10/2017   PLT 308 07/10/2017   GLUCOSE 136 07/17/2017   CHOL 167 08/05/2017   TRIG (H) 08/05/2017    400.0 Triglyceride is over 400; calculations on Lipids are invalid.   HDL 35.50 (L) 08/05/2017   LDLDIRECT 73.0 08/05/2017   LDLCALC 68 07/30/2013   ALT 34 07/10/2017   AST 15 07/10/2017   NA 141 07/17/2017   K 3.9 07/17/2017   CL 104 07/17/2017   CREATININE 1.15 (H) 07/17/2017   BUN 16 07/17/2017   CO2 24 07/17/2017   TSH 1.989 07/10/2017   INR 1.04 01/14/2013   HGBA1C 6.4 06/09/2017   MICROALBUR 15.8 (H) 08/05/2017  Mm Screening Breast Tomo Bilateral  Result Date: 08/05/2017 CLINICAL DATA:  Screening. EXAM: DIGITAL SCREENING BILATERAL MAMMOGRAM WITH TOMO AND CAD COMPARISON:  Previous exam(s). ACR Breast Density Category b: There are scattered areas of fibroglandular density. FINDINGS: There are no findings suspicious for malignancy. Images were processed with CAD. IMPRESSION: No mammographic evidence of malignancy. A result letter of this screening mammogram will be mailed directly to the patient. RECOMMENDATION: Screening mammogram in one year. (Code:SM-B-01Y) BI-RADS CATEGORY  1: Negative. Electronically Signed   By: Lajean Manes M.D.   On: 08/05/2017 09:34   Dg Lumbar Spine Complete  Result Date: 08/05/2017 CLINICAL DATA:  Chronic low back pain, no injury EXAM: LUMBAR SPINE - COMPLETE 4+ VIEW COMPARISON:  None. FINDINGS: The lumbar vertebrae are slightly straightened in alignment. There may be mild degenerative disc disease at the L5-S1 level with there appears to be slightly diminished disc space and mild spurring. The remainder of intervertebral disc spaces appear normal. No significant abnormality is seen involving facet joints on the oblique views. The SI joints appear corticated. The bowel gas pattern is nonspecific. Surgical clips are present in the right upper quadrant from prior cholecystectomy. IMPRESSION: 1. Slightly straightened alignment.   No acute abnormality. 2. Question mild degenerative disc disease at L5-S1. Electronically Signed   By: Ivar Drape M.D.   On: 08/05/2017 16:55   Mm Screening Breast Tomo Bilateral  Result Date: 08/05/2017 CLINICAL DATA:  Screening. EXAM: DIGITAL SCREENING BILATERAL MAMMOGRAM WITH TOMO AND CAD COMPARISON:  Previous exam(s). ACR Breast Density Category b: There are scattered areas of fibroglandular density. FINDINGS: There are no findings suspicious for malignancy. Images were processed with CAD. IMPRESSION: No mammographic evidence of malignancy. A result letter of this screening mammogram will be mailed directly to the patient. RECOMMENDATION: Screening mammogram in one year. (Code:SM-B-01Y) BI-RADS CATEGORY  1: Negative. Electronically Signed   By: Lajean Manes M.D.   On: 08/05/2017 09:34     Assessment & Plan:   Raeden was seen today for back pain and diabetes.  Diagnoses and all orders for this visit:  Hyperlipidemia with target LDL less than 100- She has achieved her LDL goal and is doing well on the statin. -     Lipid panel; Future  Essential hypertension- Her blood pressure is adequately well controlled. -     Urinalysis, Routine w reflex microscopic; Future  Type 2 diabetes mellitus with complication, without long-term current use of insulin ([place])- She wants to achieve tighter blood sugar control so will add metformin to her current combination. -     Microalbumin / creatinine urine ratio; Future -     metFORMIN (GLUCOPHAGE XR) 750 MG 24 hr tablet; Take 1 tablet (750 mg total) by mouth daily with breakfast.  Left lumbar radiculitis- She has low back pain but is neurologically intact.  Her plain film is positive for minimal DDD.  Will continue ibuprofen as needed for the discomfort. -     DG Lumbar Spine Complete; Future   I have discontinued Kryssa H. Polakowski's aspirin, clonazePAM, Lorcaserin HCl ER, methylPREDNISolone, and etodolac. I am also having her start on metFORMIN.  Additionally, I am having her maintain her glucose blood, cetirizine, glucose blood, ONETOUCH VERIO IQ SYSTEM, Suvorexant, fluticasone, linaclotide, thiamine, cloNIDine, triamterene-hydrochlorothiazide, liraglutide, omega-3 acid ethyl esters, telmisartan, simvastatin, omeprazole, Insulin Pen Needle, and labetalol.  Meds ordered this encounter  Medications  . metFORMIN (GLUCOPHAGE XR) 750 MG 24 hr tablet    Sig: Take 1 tablet (750 mg  total) by mouth daily with breakfast.    Dispense:  90 tablet    Refill:  1     Follow-up: Return in about 3 months (around 11/04/2017).  Scarlette Calico, MD

## 2017-08-05 NOTE — Patient Instructions (Signed)

## 2017-08-06 ENCOUNTER — Encounter: Payer: Self-pay | Admitting: Internal Medicine

## 2017-08-09 ENCOUNTER — Other Ambulatory Visit: Payer: Self-pay | Admitting: Internal Medicine

## 2017-08-09 DIAGNOSIS — I10 Essential (primary) hypertension: Secondary | ICD-10-CM

## 2017-08-09 DIAGNOSIS — E118 Type 2 diabetes mellitus with unspecified complications: Secondary | ICD-10-CM

## 2017-08-12 ENCOUNTER — Telehealth: Payer: Self-pay | Admitting: Cardiovascular Disease

## 2017-08-12 NOTE — Telephone Encounter (Signed)
New message ° °Pt verbalized that she is returning call for RN °

## 2017-08-12 NOTE — Telephone Encounter (Signed)
Informed pt of echo results. Pt verbalized understanding. 

## 2017-08-14 ENCOUNTER — Ambulatory Visit: Payer: 59 | Attending: Internal Medicine | Admitting: Physical Therapy

## 2017-08-14 DIAGNOSIS — R293 Abnormal posture: Secondary | ICD-10-CM

## 2017-08-14 DIAGNOSIS — M6281 Muscle weakness (generalized): Secondary | ICD-10-CM | POA: Insufficient documentation

## 2017-08-14 DIAGNOSIS — M545 Low back pain, unspecified: Secondary | ICD-10-CM

## 2017-08-14 NOTE — Patient Instructions (Signed)
   Access Code: [email or web address]  URL: [email or web address]  Date: 08/14/2017  Prepared by: Ruben Im   Exercises  Prone Quadriceps Stretch with Strap - 3 reps - 1 sets - 1x daily - 3x weekly  Hip Flexor Stretch at [facility] of Bed - 3 reps - 1 sets - 20 hold - 1x daily - 3x weekly      Ruben Im PT [facility] [address] [place], [place] [address] Phone # [phone] Fax [phone]

## 2017-08-14 NOTE — Therapy (Signed)
[facility] [address], [place], [place], [address] Phone: [phone]   Fax:  [phone]  Physical Therapy Treatment/Recertification/Discharge Summary  Patient Details  Name: Carolyn [name] MRN: [id number] Date of Birth: 1961/04/05 Referring Provider: Scarlette Calico, MD    Encounter Date: 08/14/2017  PT End of Session - 08/14/17 1643    Visit Number  4    Date for PT Re-Evaluation  08/14/17    Authorization Type  [employer]     PT Start Time  1622    PT Stop Time  1650    PT Time Calculation (min)  28 min    Activity Tolerance  Patient tolerated treatment well       Past Medical History:  Diagnosis Date  . Allergy    seasonal  . Anemia   . Anxiety attack   . Arthritis   . Benign fundic gland polyps of stomach   . Dyslipidemia   . Exertional shortness of breath   . GERD (gastroesophageal reflux disease)   . Heart murmur   . Hyperlipidemia   . Hypertension   . Migraines    "probably weekly" (01/14/2013)  . Type II diabetes mellitus ([place])     Past Surgical History:  Procedure Laterality Date  . CHOLECYSTECTOMY  1990  . COLONOSCOPY    . ENDOMETRIAL ABLATION  ~ 2000-2002   "twice" (01/14/2013)  . LEFT HEART CATHETERIZATION WITH CORONARY ANGIOGRAM N/A 01/15/2013   Procedure: LEFT HEART CATHETERIZATION WITH CORONARY ANGIOGRAM;  Surgeon: Blane Ohara, MD;  Location: [facility] CATH LAB;  Service: Cardiovascular;  Laterality: N/A;  . LEFT OOPHORECTOMY Left ~ 2003  . ORIF TIBIA & FIBULA FRACTURES Left 2003  . VAGINAL HYSTERECTOMY  ~ 2003    There were no vitals filed for this visit.  Subjective Assessment - 08/14/17 1623    Subjective  "I feel amazing." Today I walked 1 hour 15 minutes.  I'm 70% better.  If I get on a long phone call, I'll feel it but it's better when I stand up.      Currently in Pain?  No/denies    Pain Score  0-No pain    Pain Location  Back    Pain Type  Acute pain          [facility] PT Assessment - 08/14/17 0001      Observation/Other Assessments   Focus on Therapeutic Outcomes (FOTO)   17%       AROM   Lumbar Flexion  70 to toes    Lumbar Extension  20 no pain    Lumbar - Right Rotation  40 no pain    Lumbar - Left Rotation  40 no pain      Strength   Right Hip Flexion  4+/5    Right Hip Extension  4+/5    Right Hip ABduction  4+/5    Left Hip Flexion  4+/5    Left Hip Extension  4+/5    Left Hip ABduction  4+/5                   [facility] Adult PT Treatment/Exercise - 08/14/17 0001      Self-Care   Self-Care  Lifting    Lifting  review of body mechanics with lifting     Posture  discussion of limiting sitting; frequent standing breaks      Lumbar Exercises: Stretches   Hip Flexor Stretch  Right;Left;3 reps;20 seconds    Quad  Stretch  Right;Left;3 reps;20 seconds             PT Education - 08/14/17 1642    Education provided  Yes    Education Details  prone quad stretch; supine hip flexor stretch     Person(s) Educated  Patient    Methods  Explanation;Demonstration;Handout    Comprehension  Returned demonstration;Verbalized understanding       PT Short Term Goals - 08/14/17 1638      PT SHORT TERM GOAL #1   Title  Pt will demo independence and consistency with her initial HEP to improve strength, flexibility and posture awareness.     Status  Achieved        PT Long Term Goals - 08/14/17 1635      PT LONG TERM GOAL #1   Title  Pt will demo improved BLE strength to atleast 4/5 MMT which will improve her stability with daily walking and other activity.     Status  Achieved      PT LONG TERM GOAL #2   Title  Pt will return to walking 2 miles atleast 4 days a week in order increase her daily activity and decrease low back pain.    Status  Achieved      PT LONG TERM GOAL #3   Title  Pt will report atleast 75% resolution of her symptoms from the start of therapy which will allow her to complete her work tasks  without as much difficulty.     Status  Partially Met      PT LONG TERM GOAL #4   Title  Pt will demo improved quadriceps flexibility to atleast 100 deg in prone testing position, which will assist with improving her standing posture throughout the day.    Status  Achieved            Plan - 08/14/17 1659    Clinical Impression Statement  The patient reports she "feels amazing" and has returned to walking > 1 hour daily.  She rates her overall improvement at 70%.  Her only pain is with sitting for long periods of time.  She has full lumbar ROM.  Trunk and and LE strength grossly 4+/5.    She is independent in HEP.  She has met the majority of rehab goals and patient expresses readiness for discharge.   Will discharge from PT at this time.         Patient will benefit from skilled therapeutic intervention in order to improve the following deficits and impairments:     Visit Diagnosis: Acute bilateral low back pain without sciatica - Plan: PT plan of care cert/re-cert  Muscle weakness (generalized) - Plan: PT plan of care cert/re-cert  Abnormal posture - Plan: PT plan of care cert/re-cert  PHYSICAL THERAPY DISCHARGE SUMMARY  Visits from Start of Care: 4  Current functional level related to goals / functional outcomes: See clinical impressions above  Remaining deficits: As above   Education / Equipment: Comprehensive HEP Plan: Patient agrees to discharge.  Patient goals were partially met. Patient is being discharged due to being pleased with the current functional level.  ?????           Problem List Patient Active Problem List   Diagnosis Date Noted  . Anemia in other chronic diseases classified elsewhere 07/29/2017  . 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 Slow transit constipation 10/19/2015  . Hypertriglyceridemia 05/25/2015  .  Routine general medical examination at a health care  facility 05/25/2015  . Visit for screening mammogram 10/27/2014  . Gastric outlet obstruction 03/09/2014  . Left lumbar radiculitis 10/23/2013  . Obesity (BMI 30-39.9) 09/20/2013  . Decreased cardiac ejection fraction 09/17/2013  . Snoring 07/30/2013  . Intermediate coronary syndrome, progressive angina 01/14/2013  . Type II diabetes mellitus with manifestations ([place]) 12/02/2012  . GERD (gastroesophageal reflux disease)   . Hyperlipidemia with target LDL less than 100   . Allergic rhinitis 07/22/2010  . Essential hypertension 05/25/2010   Ruben Im, PT 08/14/17 5:01 PM Phone: [phone] Fax: [phone]  Alvera Singh 08/14/2017, 5:00 PM  [facility] [address] [place], [place], [address] Phone: [phone]   Fax:  [phone]  Name: Carolyn [name] MRN: [id number] Date of Birth: [birth year]

## 2017-08-15 ENCOUNTER — Other Ambulatory Visit: Payer: Self-pay | Admitting: Internal Medicine

## 2017-08-21 NOTE — Telephone Encounter (Signed)
Can you close this please, Thank you.  °

## 2017-08-31 ENCOUNTER — Other Ambulatory Visit: Payer: Self-pay | Admitting: Internal Medicine

## 2017-09-05 ENCOUNTER — Telehealth: Payer: Self-pay | Admitting: Hematology and Oncology

## 2017-09-05 NOTE — Telephone Encounter (Signed)
Called pt re new appts due to perlov leaving - left vm re appts and sent out confirmation letter

## 2017-09-11 ENCOUNTER — Telehealth: Payer: Self-pay | Admitting: Internal Medicine

## 2017-09-11 NOTE — Telephone Encounter (Signed)
Patient came by the office to drop off FMLA paperwork to be completed by Dr Ronnald Ramp. She would like them faxed and a call when it is done. Placed in Brittany's box for completion.

## 2017-09-12 NOTE — Telephone Encounter (Signed)
yes

## 2017-09-12 NOTE — Telephone Encounter (Signed)
Forms have been completed & placed in providers box to review and sign.  °

## 2017-09-12 NOTE — Telephone Encounter (Signed)
Informed patient Dr.Jones is out of the office on Friday but the forms will be faxed before the due date of June 11.   Patient is requesting a renewal of the FMLA forms. [name] are you ok with this?  LOV: 08/05/2017

## 2017-09-15 DIAGNOSIS — Z0279 Encounter for issue of other medical certificate: Secondary | ICD-10-CM

## 2017-09-15 NOTE — Telephone Encounter (Signed)
Forms signed, Faxed to [facility] @ [phone], Copy sent to scan & charged for.   Patient has been informed and would like the original mailed to her.

## 2017-10-22 ENCOUNTER — Inpatient Hospital Stay: Payer: 59 | Attending: Hematology and Oncology | Admitting: Hematology and Oncology

## 2017-10-22 ENCOUNTER — Inpatient Hospital Stay: Payer: 59

## 2017-10-22 DIAGNOSIS — I10 Essential (primary) hypertension: Secondary | ICD-10-CM | POA: Diagnosis not present

## 2017-10-22 DIAGNOSIS — E519 Thiamine deficiency, unspecified: Secondary | ICD-10-CM

## 2017-10-22 DIAGNOSIS — D649 Anemia, unspecified: Secondary | ICD-10-CM | POA: Diagnosis not present

## 2017-10-22 DIAGNOSIS — E119 Type 2 diabetes mellitus without complications: Secondary | ICD-10-CM | POA: Insufficient documentation

## 2017-10-22 DIAGNOSIS — D538 Other specified nutritional anemias: Secondary | ICD-10-CM

## 2017-10-22 LAB — CMP (CANCER CENTER ONLY)
ALK PHOS: 74 U/L (ref 38–126)
ALT: 17 U/L (ref 0–44)
ANION GAP: 10 (ref 5–15)
AST: 11 U/L — ABNORMAL LOW (ref 15–41)
Albumin: 4.5 g/dL (ref 3.5–5.0)
BUN: 15 mg/dL (ref 6–20)
CALCIUM: 10.3 mg/dL (ref 8.9–10.3)
CHLORIDE: 104 mmol/L (ref 98–111)
CO2: 25 mmol/L (ref 22–32)
CREATININE: 1.08 mg/dL — AB (ref 0.44–1.00)
GFR, EST NON AFRICAN AMERICAN: 57 mL/min — AB (ref >60)
Glucose, Bld: 142 mg/dL — ABNORMAL HIGH (ref 70–99)
Potassium: 3.8 mmol/L (ref 3.5–5.1)
Sodium: 139 mmol/L (ref 135–145)
Total Bilirubin: 0.3 mg/dL (ref 0.3–1.2)
Total Protein: 8 g/dL (ref 6.5–8.1)

## 2017-10-22 LAB — CBC WITH DIFFERENTIAL (CANCER CENTER ONLY)
Basophils Absolute: 0 10*3/uL (ref 0.0–0.1)
Basophils Relative: 0 %
EOS ABS: 0.1 10*3/uL (ref 0.0–0.5)
EOS PCT: 2 %
HCT: 30.8 % — ABNORMAL LOW (ref 34.8–46.6)
HEMOGLOBIN: 10.3 g/dL — AB (ref 11.6–15.9)
LYMPHS ABS: 2.8 10*3/uL (ref 0.9–3.3)
LYMPHS PCT: 34 %
MCH: 29.4 pg (ref 25.1–34.0)
MCHC: 33.4 g/dL (ref 31.5–36.0)
MCV: 88 fL (ref 79.5–101.0)
Monocytes Absolute: 0.3 10*3/uL (ref 0.1–0.9)
Monocytes Relative: 4 %
NEUTROS PCT: 60 %
Neutro Abs: 4.9 10*3/uL (ref 1.5–6.5)
Platelet Count: 286 10*3/uL (ref 145–400)
RBC: 3.5 MIL/uL — AB (ref 3.70–5.45)
RDW: 13.4 % (ref 11.2–14.5)
WBC: 8.2 10*3/uL (ref 3.9–10.3)

## 2017-10-22 NOTE — Progress Notes (Signed)
Patient Care Team: [name], MD as PCP - General (Internal Medicine)  DIAGNOSIS:  Encounter Diagnosis  Name Primary?  . Normocytic anemia     CHIEF COMPLIANT: Follow-up of normocytic anemia, transfer of care from Dr. Lebron Conners  INTERVAL HISTORY: Carolyn BOJARSKI is a 57 year old with above-mentioned history of normocytic anemia.  She had extensive work-up including iron studies P10 and folic acid all of which were normal.  She does not feel any symptoms of anemia.  Denies any nausea vomiting.  She has diabetes and hypertension and appears to be doing quite well.  Her kidney function was slightly abnormal.  She tells me that the diabetes is sometimes not well controlled.  REVIEW OF SYSTEMS:   Constitutional: Denies fevers, chills or abnormal weight loss Eyes: Denies blurriness of vision Ears, nose, mouth, throat, and face: Denies mucositis or sore throat Respiratory: Denies cough, dyspnea or wheezes Cardiovascular: Denies palpitation, chest discomfort Gastrointestinal:  Denies nausea, heartburn or change in bowel habits Skin: Denies abnormal skin rashes Lymphatics: Denies new lymphadenopathy or easy bruising Neurological:Denies numbness, tingling or new weaknesses Behavioral/Psych: Mood is stable, no new changes  Extremities: No lower extremity edema Breast:  denies any pain or lumps or nodules in either breasts All other systems were reviewed with the patient and are negative.  I have reviewed the past medical history, past surgical history, social history and family history with the patient and they are unchanged from previous note.  ALLERGIES:  is allergic to invokana [canagliflozin]; codeine; flexeril [cyclobenzaprine]; morphine and related; and reglan [metoclopramide].  MEDICATIONS:  Current Outpatient Medications  Medication Sig Dispense Refill  . Blood Glucose Monitoring Suppl (ONETOUCH VERIO IQ SYSTEM) W/DEVICE KIT 1 Act by Does not apply route 3 (three) times daily. 2  kit 0  . cetirizine (ZYRTEC) 10 MG chewable tablet Chew 10 mg by mouth daily.    . cloNIDine (CATAPRES) 0.1 MG tablet TAKE 2 TABLETS BY MOUTH ONCE DAILY 60 tablet 5  . fluticasone (FLONASE) 50 MCG/ACT nasal spray Place 2 sprays into both nostrils daily. 48 g 3  . glucose blood (ONE TOUCH ULTRA TEST) test strip Use to test blood sugar three times a week and prn if having symptoms of low blood sugar Dx: 250.92 90 day supply 100 each 11  . glucose blood (ONETOUCH VERIO) test strip Use TID 100 each 12  . Insulin Pen Needle (NOVOFINE) 32G X 6 MM MISC 1 Act by Does not apply route daily. 100 each 3  . labetalol (NORMODYNE) 300 MG tablet Take 1 tablet (300 mg total) by mouth 2 (two) times daily. 180 tablet 3  . linaclotide (LINZESS) 290 MCG CAPS capsule Take 1 capsule (290 mcg total) by mouth daily before breakfast. 90 capsule 1  . liraglutide (VICTOZA) 18 MG/3ML SOPN Inject 0.3 mLs (1.8 mg total) into the skin daily. 18 pen 1  . metFORMIN (GLUCOPHAGE XR) 750 MG 24 hr tablet Take 1 tablet (750 mg total) by mouth daily with breakfast. 90 tablet 1  . omega-3 acid ethyl esters (LOVAZA) 1 g capsule Take 2 capsules (2 g total) by mouth 2 (two) times daily. 360 capsule 1  . omeprazole (PRILOSEC) 40 MG capsule TAKE ONE CAPSULE BY MOUTH TWICE DAILY AT 8 AM AND 10 PM 180 capsule 1  . simvastatin (ZOCOR) 40 MG tablet Take 1 tablet (40 mg total) by mouth at bedtime. 90 tablet 3  . Suvorexant (BELSOMRA) 15 MG TABS Take 1 tablet by mouth at bedtime as needed.  30 tablet 5  . telmisartan (MICARDIS) 80 MG tablet Take 1 tablet (80 mg total) by mouth daily. 90 tablet 1  . telmisartan (MICARDIS) 80 MG tablet TALE 1 TABLET BY MOUTH DAILY 90 tablet 1  . thiamine (VITAMIN B-1) 100 MG tablet Take 1 tablet (100 mg total) by mouth daily. 90 tablet 3  . triamterene-hydrochlorothiazide (DYAZIDE) 37.5-25 MG capsule TAKE 1 CAPSULE BY MOUTH ONCE DAILY 30 capsule 5   No current facility-administered medications for this visit.      PHYSICAL EXAMINATION: ECOG PERFORMANCE STATUS: 1 - Symptomatic but completely ambulatory  Vitals:   10/22/17 1429  BP: 114/82  Pulse: 90  Resp: 20  Temp: 98.7 F (37.1 C)  SpO2: 99%   Filed Weights   10/22/17 1429  Weight: 185 lb 12.8 oz (84.3 kg)    GENERAL:alert, no distress and comfortable SKIN: skin color, texture, turgor are normal, no rashes or significant lesions EYES: normal, Conjunctiva are pink and non-injected, sclera clear OROPHARYNX:no exudate, no erythema and lips, buccal mucosa, and tongue normal  NECK: supple, thyroid normal size, non-tender, without nodularity LYMPH:  no palpable lymphadenopathy in the cervical, axillary or inguinal LUNGS: clear to auscultation and percussion with normal breathing effort HEART: regular rate & rhythm and no murmurs and no lower extremity edema ABDOMEN:abdomen soft, non-tender and normal bowel sounds MUSCULOSKELETAL:no cyanosis of digits and no clubbing  NEURO: alert & oriented x 3 with fluent speech, no focal motor/sensory deficits EXTREMITIES: No lower extremity edema   LABORATORY DATA:  I have reviewed the data as listed CMP Latest Ref Rng & Units 10/22/2017 07/17/2017 07/10/2017  Glucose 70 - 99 mg/dL 142(H) 136 103  BUN 6 - 20 mg/dL 15 16 30(H)  Creatinine 0.44 - 1.00 mg/dL 1.08(H) 1.15(H) 1.28(H)  Sodium 135 - 145 mmol/L 139 141 140  Potassium 3.5 - 5.1 mmol/L 3.8 3.9 3.9  Chloride 98 - 111 mmol/L 104 104 104  CO2 22 - 32 mmol/L 25 24 23  Calcium 8.9 - 10.3 mg/dL 10.3 10.3 9.8  Total Protein 6.5 - 8.1 g/dL 8.0 - 8.1  Total Bilirubin 0.3 - 1.2 mg/dL 0.3 - 0.5  Alkaline Phos 38 - 126 U/L 74 - 73  AST 15 - 41 U/L 11(L) - 15  ALT 0 - 44 U/L 17 - 34    Lab Results  Component Value Date   WBC 8.2 10/22/2017   HGB 10.3 (L) 10/22/2017   HCT 30.8 (L) 10/22/2017   MCV 88.0 10/22/2017   PLT 286 10/22/2017   NEUTROABS 4.9 10/22/2017    ASSESSMENT & PLAN:  Normocytic anemia Normocytic anemia: Hemoglobin 10.3. Her  hemoglobin was 10.69 months ago and it has been slowly declining.  Extensive work-up done by Dr. Perlov which included iron studies, B12, folic acid, micronutrients have all yielded normal results.  She did have elevated serum creatinine 3 months ago.  She is a diabetic and hypertensive.  Plan: I recommended that we perform bone marrow aspiration biopsy.  We will set up an appointment with Lindsey for her to get this procedure done.  I will see her back in 2 to 3 weeks to go over the results of the tests.    No orders of the defined types were placed in this encounter.  The patient has a good understanding of the overall plan. she agrees with it. she will call with any problems that may develop before the next visit here.   Viinay K , MD 10/22/17    

## 2017-10-22 NOTE — Assessment & Plan Note (Signed)
Normocytic anemia: Hemoglobin 10.3. Her hemoglobin was 10.69 months ago and it has been slowly declining.  Extensive work-up done by Dr. Lebron Conners which included iron studies, F68, folic acid, micronutrients have all yielded normal results.  She did have elevated serum creatinine 3 months ago.  She is a diabetic and hypertensive.  Plan: I recommended that we perform bone marrow aspiration biopsy.  We will set up an appointment with Mendel Ryder for her to get this procedure done.  I will see her back in 2 to 3 weeks to go over the results of the tests.

## 2017-10-23 ENCOUNTER — Telehealth: Payer: Self-pay | Admitting: Hematology and Oncology

## 2017-10-23 NOTE — Telephone Encounter (Signed)
Let message for patient regarding upcoming aug appts per 7/10 los.

## 2017-10-23 NOTE — Telephone Encounter (Signed)
Patient scheduled aug 1st due to avail of providers schedule. VG aware of update.

## 2017-10-24 ENCOUNTER — Other Ambulatory Visit: Payer: Self-pay | Admitting: Internal Medicine

## 2017-10-24 DIAGNOSIS — Z794 Long term (current) use of insulin: Principal | ICD-10-CM

## 2017-10-24 DIAGNOSIS — E118 Type 2 diabetes mellitus with unspecified complications: Secondary | ICD-10-CM

## 2017-10-27 ENCOUNTER — Inpatient Hospital Stay: Payer: 59

## 2017-10-27 ENCOUNTER — Inpatient Hospital Stay (HOSPITAL_BASED_OUTPATIENT_CLINIC_OR_DEPARTMENT_OTHER): Payer: 59 | Admitting: Hematology and Oncology

## 2017-10-27 VITALS — BP 140/85 | HR 79 | Resp 17

## 2017-10-27 DIAGNOSIS — D464 Refractory anemia, unspecified: Secondary | ICD-10-CM

## 2017-10-27 DIAGNOSIS — D638 Anemia in other chronic diseases classified elsewhere: Secondary | ICD-10-CM

## 2017-10-27 DIAGNOSIS — D649 Anemia, unspecified: Secondary | ICD-10-CM | POA: Diagnosis not present

## 2017-10-27 LAB — CBC WITH DIFFERENTIAL (CANCER CENTER ONLY)
BASOS ABS: 0.1 10*3/uL (ref 0.0–0.1)
Basophils Relative: 1 %
Eosinophils Absolute: 0.2 10*3/uL (ref 0.0–0.5)
Eosinophils Relative: 2 %
HEMATOCRIT: 32 % — AB (ref 34.8–46.6)
Hemoglobin: 10.5 g/dL — ABNORMAL LOW (ref 11.6–15.9)
LYMPHS ABS: 2.2 10*3/uL (ref 0.9–3.3)
LYMPHS PCT: 28 %
MCH: 29 pg (ref 25.1–34.0)
MCHC: 32.9 g/dL (ref 31.5–36.0)
MCV: 88.3 fL (ref 79.5–101.0)
Monocytes Absolute: 0.5 10*3/uL (ref 0.1–0.9)
Monocytes Relative: 6 %
NEUTROS ABS: 5.1 10*3/uL (ref 1.5–6.5)
Neutrophils Relative %: 63 %
Platelet Count: 301 10*3/uL (ref 145–400)
RBC: 3.63 MIL/uL — AB (ref 3.70–5.45)
RDW: 13.8 % (ref 11.2–14.5)
WBC: 8 10*3/uL (ref 3.9–10.3)

## 2017-10-27 MED ORDER — LIDOCAINE HCL 2 % IJ SOLN
INTRAMUSCULAR | Status: AC
  Filled 2017-10-27: qty 20

## 2017-10-27 MED ORDER — LORAZEPAM 1 MG PO TABS
ORAL_TABLET | ORAL | Status: AC
  Filled 2017-10-27: qty 1

## 2017-10-27 MED ORDER — LORAZEPAM 1 MG PO TABS
1.0000 mg | ORAL_TABLET | Freq: Once | ORAL | Status: AC
  Administered 2017-10-27: 1 mg via ORAL

## 2017-10-27 NOTE — Patient Instructions (Signed)
[facility] Discharge Instructions for Post Bone Marrow Procedure  Today you had a bone marrow biopsy and aspirate of the right hip   Please keep the pressure dressing in place for at least 24 hours.  Have someone check your dressing periodically for bleeding.  If needed you can reapply a pressure dressing to the site.  Take pain medication as directed.  IF BLEEDING REOCCURS THAT SHOULD BE REPORTED IMMEDIATELY. Call the Cancer Center at [phone] if during business hours. Or report to the Emergency Room.    

## 2017-10-27 NOTE — Progress Notes (Signed)
INDICATION: refractory anemia    Bone Marrow Biopsy and Aspiration Procedure Note   Informed consent was obtained and potential risks including bleeding, infection and pain were reviewed with the patient.  The patient's name, date of birth, identification, consent and allergies were verified prior to the start of procedure and time out was performed.  The right posterior iliac crest was chosen as the site of biopsy.  The skin was prepped with ChloraPrep.   16 cc of 2% lidocaine was used to provide local anaesthesia.   Difficulty with obtaining aspirate, 1.3 cm biopsy obtained by Dr. Lindi Adie. Pressure was applied to the biopsy site and bandage was placed over the biopsy site. Patient was made to lie on the back for 30 mins prior to discharge.  The procedure was tolerated well. COMPLICATIONS: None BLOOD LOSS: none The patient was discharged home in stable condition with a 2 week follow up to review results.  Patient was provided with post bone marrow biopsy instructions and instructed to call if there was any bleeding or worsening pain.  Specimens sent for flow cytometry, cytogenetics and additional studies.  Signed Scot Dock, NP  Attending Note  I personally supervised the above procedure.  I was present from the consent to completion.  Aspirate was challenging in spite of multiple attempts.  We were able to get 2 small biopsy specimens.  Patient experienced pain during the bone marrow procedure that was short lasting.  She also complained of numb feeling after the lidocaine. Overall she was stable at the time of discharge.  Signed Harriette Ohara, MD

## 2017-10-31 ENCOUNTER — Telehealth: Payer: Self-pay | Admitting: *Deleted

## 2017-10-31 ENCOUNTER — Telehealth: Payer: Self-pay

## 2017-10-31 NOTE — Telephone Encounter (Signed)
FYI "I would like to speak with the nurse in reference to the procedure I had on Monday to obtain results.  I was told to call to receive preliminary results."  Call transferred per patient request.  Next scheduled F/U Wednesday, November 13, 2017 at 10:45 am.

## 2017-10-31 NOTE — Telephone Encounter (Signed)
Returned pt's call regarding she would like her results from the recent bone marrow biopsy.  She has an upcoming appt with Dr Lindi Adie for 8-1.  I let pt know I did not see any results yet.  No other needs per pt at this time.

## 2017-11-03 ENCOUNTER — Other Ambulatory Visit (INDEPENDENT_AMBULATORY_CARE_PROVIDER_SITE_OTHER): Payer: 59

## 2017-11-03 ENCOUNTER — Ambulatory Visit (INDEPENDENT_AMBULATORY_CARE_PROVIDER_SITE_OTHER): Payer: 59 | Admitting: Family

## 2017-11-03 ENCOUNTER — Encounter: Payer: Self-pay | Admitting: Family

## 2017-11-03 VITALS — BP 136/88 | HR 84 | Temp 98.4°F | Ht 65.0 in | Wt 185.1 lb

## 2017-11-03 DIAGNOSIS — Z91038 Other insect allergy status: Secondary | ICD-10-CM

## 2017-11-03 DIAGNOSIS — E118 Type 2 diabetes mellitus with unspecified complications: Secondary | ICD-10-CM

## 2017-11-03 LAB — COMPREHENSIVE METABOLIC PANEL
ALBUMIN: 4.8 g/dL (ref 3.5–5.2)
ALT: 22 U/L (ref 0–35)
AST: 15 U/L (ref 0–37)
Alkaline Phosphatase: 65 U/L (ref 39–117)
BUN: 15 mg/dL (ref 6–23)
CO2: 26 meq/L (ref 19–32)
CREATININE: 1.02 mg/dL (ref 0.40–1.20)
Calcium: 9.9 mg/dL (ref 8.4–10.5)
Chloride: 103 mEq/L (ref 96–112)
GFR: 72.17 mL/min (ref >60.00)
Glucose, Bld: 97 mg/dL (ref 70–99)
Potassium: 3.9 mEq/L (ref 3.5–5.1)
Sodium: 137 mEq/L (ref 135–145)
Total Bilirubin: 0.3 mg/dL (ref 0.2–1.2)
Total Protein: 8.3 g/dL (ref 6.0–8.3)

## 2017-11-03 LAB — HEMOGLOBIN A1C: HEMOGLOBIN A1C: 6.7 % — AB (ref 4.6–6.5)

## 2017-11-03 MED ORDER — EPINEPHRINE 0.3 MG/0.3ML IJ SOAJ: 0.3000 mg | Freq: Once | INTRAMUSCULAR | 0 refills | Status: AC

## 2017-11-03 MED ORDER — PREDNISONE 20 MG PO TABS: 20.0000 mg | ORAL_TABLET | Freq: Every day | ORAL | 0 refills | Status: DC

## 2017-11-03 MED ORDER — BLOOD GLUCOSE MONITOR KIT: PACK | 0 refills | Status: DC

## 2017-11-03 NOTE — Progress Notes (Signed)
Carolyn [name] is a 57 y.o. female with the following history as recorded in [employer]:  Patient Active Problem List   Diagnosis Date Noted  . Normocytic anemia 10/22/2017  . Anemia in other chronic diseases classified elsewhere 07/29/2017  . 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 Slow transit constipation 10/19/2015  . Hypertriglyceridemia 05/25/2015  . Routine general medical examination at a health care facility 05/25/2015  . Visit for screening mammogram 10/27/2014  . Gastric outlet obstruction 03/09/2014  . Left lumbar radiculitis 10/23/2013  . Obesity (BMI 30-39.9) 09/20/2013  . Decreased cardiac ejection fraction 09/17/2013  . Snoring 07/30/2013  . Intermediate coronary syndrome, progressive angina 01/14/2013  . Type II diabetes mellitus with manifestations ([place]) 12/02/2012  . GERD (gastroesophageal reflux disease)   . Hyperlipidemia with target LDL less than 100   . Allergic rhinitis 07/22/2010  . Essential hypertension 05/25/2010    Current Outpatient Medications  Medication Sig Dispense Refill  . Blood Glucose Monitoring Suppl (ONETOUCH VERIO IQ SYSTEM) W/DEVICE KIT 1 Act by Does not apply route 3 (three) times daily. 2 kit 0  . cetirizine (ZYRTEC) 10 MG chewable tablet Chew 10 mg by mouth daily.    . cloNIDine (CATAPRES) 0.1 MG tablet TAKE 2 TABLETS BY MOUTH ONCE DAILY 60 tablet 5  . etodolac (LODINE XL) 500 MG 24 hr tablet   0  . fluticasone (FLONASE) 50 MCG/ACT nasal spray Place 2 sprays into both nostrils daily. 48 g 3  . glucose blood (ONE TOUCH ULTRA TEST) test strip Use to test blood sugar three times a week and prn if having symptoms of low blood sugar Dx: 250.92 90 day supply 100 each 11  . glucose blood (ONETOUCH VERIO) test strip Use TID 100 each 12  . Insulin Pen Needle (NOVOFINE) 32G X 6 MM MISC 1 Act by Does not apply route daily. 100 each 3  . labetalol (NORMODYNE) 300 MG tablet  Take 1 tablet (300 mg total) by mouth 2 (two) times daily. 180 tablet 3  . metFORMIN (GLUCOPHAGE XR) 750 MG 24 hr tablet Take 1 tablet (750 mg total) by mouth daily with breakfast. 90 tablet 1  . omega-3 acid ethyl esters (LOVAZA) 1 g capsule Take 2 capsules (2 g total) by mouth 2 (two) times daily. 360 capsule 1  . omeprazole (PRILOSEC) 40 MG capsule TAKE ONE CAPSULE BY MOUTH TWICE DAILY AT 8 AM AND 10 PM 180 capsule 1  . simvastatin (ZOCOR) 40 MG tablet Take 1 tablet (40 mg total) by mouth at bedtime. 90 tablet 3  . Suvorexant (BELSOMRA) 15 MG TABS Take 1 tablet by mouth at bedtime as needed. 30 tablet 5  . telmisartan (MICARDIS) 80 MG tablet TALE 1 TABLET BY MOUTH DAILY 90 tablet 1  . thiamine (VITAMIN B-1) 100 MG tablet Take 1 tablet (100 mg total) by mouth daily. 90 tablet 3  . triamterene-hydrochlorothiazide (DYAZIDE) 37.5-25 MG capsule TAKE 1 CAPSULE BY MOUTH ONCE DAILY 30 capsule 5  . VICTOZA 18 MG/3ML SOPN INJECT 0.3 MLS (1.8MG TOTAL) INTO THE SKIN DAILY 9 pen 1  . blood glucose meter kit and supplies KIT Dispense based on patient and insurance preference. Use up to four times daily as directed. (FOR ICD-9 250.00, 250.01). 1 each 0  . EPINEPHrine 0.3 mg/0.3 mL IJ SOAJ injection Inject 0.3 mLs (0.3 mg total) into the muscle once for 1 dose. 1 Device 0  . predniSONE (DELTASONE) 20  MG tablet Take 1 tablet (20 mg total) by mouth daily with breakfast. 5 tablet 0   No current facility-administered medications for this visit.     Allergies: Invokana [canagliflozin]; Codeine; Flexeril [cyclobenzaprine]; Morphine and related; and Reglan [metoclopramide]  Past Medical History:  Diagnosis Date  . Allergy    seasonal  . Anemia   . Anxiety attack   . Arthritis   . Benign fundic gland polyps of stomach   . Dyslipidemia   . Exertional shortness of breath   . GERD (gastroesophageal reflux disease)   . Heart murmur   . Hyperlipidemia   . Hypertension   . Migraines    "probably weekly"  (01/14/2013)  . Type II diabetes mellitus ([place])     Past Surgical History:  Procedure Laterality Date  . CHOLECYSTECTOMY  1990  . COLONOSCOPY    . ENDOMETRIAL ABLATION  ~ 2000-2002   "twice" (01/14/2013)  . LEFT HEART CATHETERIZATION WITH CORONARY ANGIOGRAM N/A 01/15/2013   Procedure: LEFT HEART CATHETERIZATION WITH CORONARY ANGIOGRAM;  Surgeon: Blane Ohara, MD;  Location: [facility] CATH LAB;  Service: Cardiovascular;  Laterality: N/A;  . LEFT OOPHORECTOMY Left ~ 2003  . ORIF TIBIA & FIBULA FRACTURES Left 2003  . VAGINAL HYSTERECTOMY  ~ 2003    Family History  Problem Relation Age of Onset  . Alzheimer's disease Mother   . Hypertension Mother   . Emphysema Father   . Thyroid cancer Sister   . Cancer Sister        Breast Cancer  . Diabetes Sister   . Diabetes Sister   . Emphysema Sister   . Diabetes Brother   . Colon cancer Neg Hx     Social History   Tobacco Use  . Smoking status: Never Smoker  . Smokeless tobacco: Never Used  Substance Use Topics  . Alcohol use: No    Alcohol/week: 0.0 oz    Comment: socially    Subjective: Patient presents with multiple concerns: 1) Was stung by a bee on her right foot on 10/24/17; still having persisting swelling in her right foot although improved to original injury; has tried Benadryl on a few occasions with no relief; 2) Worried that Metformin may be "too strong" and causing her stomach to be upset; 3) Worried that her blood sugar is not well controlled at night/ wonders if she could take one of her medications at night; currently on Glucophage XR 750 in the am and Victoza daily;    Objective:  Vitals:   11/03/17 1446  BP: 136/88  Pulse: 84  Temp: 98.4 F (36.9 C)  TempSrc: Oral  SpO2: 99%  Weight: 185 lb 1.9 oz (84 kg)  Height: '5\' 5"'  (1.651 m)    General: Well developed, well nourished, in no acute distress  Skin : Warm and dry. Area c/w insect bite noted on outer right foot; mild swelling noted across top of foot; Head:  Normocephalic and atraumatic  Eyes: Sclera and conjunctiva clear; pupils round and reactive to light; extraocular movements intact  Ears: External normal; canals clear; tympanic membranes normal  Oropharynx: Pink, supple. No suspicious lesions  Neck: Supple without thyromegaly, adenopathy  Lungs: Respirations unlabored; clear to auscultation bilaterally without wheeze, rales, rhonchi  CVS exam: normal rate and regular rhythm.  Extremities: No pedal edema, cyanosis, clubbing  Vessels: Symmetric bilaterally  Neurologic: Alert and oriented; speech intact; face symmetrical; moves all extremities well; CNII-XII intact without focal deficit   Assessment:  1. Allergic reaction to insect bite  2. Type 2 diabetes mellitus with complication, without long-term current use of insulin ([place])     Plan:  1. Rx for Prednisone 20 mg qd x 5 days; okay to use Benadryl at night; follow-up worse, no better; Rx for Epi-pen updated; 2. Rx for new glucometer; check CMP, Hgba1c; assuming control is stable, can try lowering Metformin to 500 mg and have her take this at night; follow-up to be determined.   No follow-ups on file.  Orders Placed This Encounter  Procedures  . Comp Met (CMET)    Standing Status:   Future    Number of Occurrences:   1    Standing Expiration Date:   11/03/2018  . HgB A1c    Standing Status:   Future    Number of Occurrences:   1    Standing Expiration Date:   11/03/2018    Requested Prescriptions   Signed Prescriptions Disp Refills  . predniSONE (DELTASONE) 20 MG tablet 5 tablet 0    Sig: Take 1 tablet (20 mg total) by mouth daily with breakfast.  . EPINEPHrine 0.3 mg/0.3 mL IJ SOAJ injection 1 Device 0    Sig: Inject 0.3 mLs (0.3 mg total) into the muscle once for 1 dose.  . blood glucose meter kit and supplies KIT 1 each 0    Sig: Dispense based on patient and insurance preference. Use up to four times daily as directed. (FOR ICD-9 250.00, 250.01).

## 2017-11-04 ENCOUNTER — Other Ambulatory Visit: Payer: Self-pay | Admitting: Family

## 2017-11-04 DIAGNOSIS — E118 Type 2 diabetes mellitus with unspecified complications: Secondary | ICD-10-CM

## 2017-11-04 MED ORDER — METFORMIN HCL ER 500 MG PO TB24: 500.0000 mg | ORAL_TABLET | Freq: Every day | ORAL | 2 refills | Status: DC

## 2017-11-05 ENCOUNTER — Encounter (HOSPITAL_COMMUNITY): Payer: Self-pay | Admitting: Hematology and Oncology

## 2017-11-12 ENCOUNTER — Other Ambulatory Visit: Payer: Self-pay | Admitting: Family

## 2017-11-12 ENCOUNTER — Encounter: Payer: Self-pay | Admitting: Family

## 2017-11-12 MED ORDER — FLUCONAZOLE 150 MG PO TABS: 150.0000 mg | ORAL_TABLET | ORAL | 0 refills | Status: DC

## 2017-11-12 MED ORDER — TERCONAZOLE 0.4 % VA CREA
1.0000 | TOPICAL_CREAM | Freq: Every day | VAGINAL | 0 refills | Status: AC
Start: 2017-11-12 — End: 2017-11-19

## 2017-11-13 ENCOUNTER — Inpatient Hospital Stay: Payer: 59 | Attending: Hematology and Oncology | Admitting: Hematology and Oncology

## 2017-11-13 ENCOUNTER — Telehealth: Payer: Self-pay | Admitting: Hematology and Oncology

## 2017-11-13 DIAGNOSIS — D649 Anemia, unspecified: Secondary | ICD-10-CM

## 2017-11-13 DIAGNOSIS — I10 Essential (primary) hypertension: Secondary | ICD-10-CM

## 2017-11-13 DIAGNOSIS — E119 Type 2 diabetes mellitus without complications: Secondary | ICD-10-CM | POA: Insufficient documentation

## 2017-11-13 NOTE — Assessment & Plan Note (Signed)
Normocytic anemia hemoglobin 10.5 on 10/27/2017 Felt to be related to anemia of chronic disease, possibly related to diabetes and hypertension Extensive work-up did not reveal any problems with iron, O03, folic acid and micronutrients  Bone marrow biopsy 10/27/2017: Trilineage hematopoiesis, no dyspoiesis.  Cytogenetics were normal  Plan: Watchful monitoring every 6 months with labs and follow-up

## 2017-11-13 NOTE — Telephone Encounter (Signed)
Gave patient avs and calendar.   °

## 2017-11-13 NOTE — Progress Notes (Signed)
Patient Care Team: [name], MD as PCP - General (Internal Medicine)  DIAGNOSIS:  Encounter Diagnosis  Name Primary?  . Normocytic anemia     CHIEF COMPLIANT: Follow-up to review the results of bone marrow biopsy  INTERVAL HISTORY: Carolyn ROEDEL is a 57 year old with above-mentioned history of normocytic anemia who underwent a bone marrow biopsy and is here today to discuss results.  She reports mild discomfort at the site of the bone marrow biopsy but otherwise doing quite well.  REVIEW OF SYSTEMS:   Constitutional: Denies fevers, chills or abnormal weight loss Eyes: Denies blurriness of vision Ears, nose, mouth, throat, and face: Denies mucositis or sore throat Respiratory: Denies cough, dyspnea or wheezes Cardiovascular: Denies palpitation, chest discomfort Gastrointestinal:  Denies nausea, heartburn or change in bowel habits Skin: Denies abnormal skin rashes Lymphatics: Denies new lymphadenopathy or easy bruising Neurological:Denies numbness, tingling or new weaknesses Behavioral/Psych: Mood is stable, no new changes  Extremities: No lower extremity edema All other systems were reviewed with the patient and are negative.  I have reviewed the past medical history, past surgical history, social history and family history with the patient and they are unchanged from previous note.  ALLERGIES:  is allergic to invokana [canagliflozin]; codeine; flexeril [cyclobenzaprine]; morphine and related; and reglan [metoclopramide].  MEDICATIONS:  Current Outpatient Medications  Medication Sig Dispense Refill  . blood glucose meter kit and supplies KIT Dispense based on patient and insurance preference. Use up to four times daily as directed. (FOR ICD-9 250.00, 250.01). 1 each 0  . Blood Glucose Monitoring Suppl (ONETOUCH VERIO IQ SYSTEM) W/DEVICE KIT 1 Act by Does not apply route 3 (three) times daily. 2 kit 0  . cetirizine (ZYRTEC) 10 MG chewable tablet Chew 10 mg by mouth  daily.    . cloNIDine (CATAPRES) 0.1 MG tablet TAKE 2 TABLETS BY MOUTH ONCE DAILY 60 tablet 5  . etodolac (LODINE XL) 500 MG 24 hr tablet   0  . fluconazole (DIFLUCAN) 150 MG tablet Take 1 tablet (150 mg total) by mouth as directed. Repeat after 72 hours 2 tablet 0  . fluticasone (FLONASE) 50 MCG/ACT nasal spray Place 2 sprays into both nostrils daily. 48 g 3  . glucose blood (ONE TOUCH ULTRA TEST) test strip Use to test blood sugar three times a week and prn if having symptoms of low blood sugar Dx: 250.92 90 day supply 100 each 11  . glucose blood (ONETOUCH VERIO) test strip Use TID 100 each 12  . Insulin Pen Needle (NOVOFINE) 32G X 6 MM MISC 1 Act by Does not apply route daily. 100 each 3  . labetalol (NORMODYNE) 300 MG tablet Take 1 tablet (300 mg total) by mouth 2 (two) times daily. 180 tablet 3  . metFORMIN (GLUCOPHAGE-XR) 500 MG 24 hr tablet Take 1 tablet (500 mg total) by mouth daily after supper. 30 tablet 2  . omega-3 acid ethyl esters (LOVAZA) 1 g capsule Take 2 capsules (2 g total) by mouth 2 (two) times daily. 360 capsule 1  . omeprazole (PRILOSEC) 40 MG capsule TAKE ONE CAPSULE BY MOUTH TWICE DAILY AT 8 AM AND 10 PM 180 capsule 1  . predniSONE (DELTASONE) 20 MG tablet Take 1 tablet (20 mg total) by mouth daily with breakfast. 5 tablet 0  . simvastatin (ZOCOR) 40 MG tablet Take 1 tablet (40 mg total) by mouth at bedtime. 90 tablet 3  . Suvorexant (BELSOMRA) 15 MG TABS Take 1 tablet by mouth at bedtime as  needed. 30 tablet 5  . telmisartan (MICARDIS) 80 MG tablet TALE 1 TABLET BY MOUTH DAILY 90 tablet 1  . terconazole (TERAZOL 7) 0.4 % vaginal cream Place 1 applicator vaginally at bedtime for 7 days. 45 g 0  . thiamine (VITAMIN B-1) 100 MG tablet Take 1 tablet (100 mg total) by mouth daily. 90 tablet 3  . triamterene-hydrochlorothiazide (DYAZIDE) 37.5-25 MG capsule TAKE 1 CAPSULE BY MOUTH ONCE DAILY 30 capsule 5  . VICTOZA 18 MG/3ML SOPN INJECT 0.3 MLS (1.8MG TOTAL) INTO THE SKIN DAILY  9 pen 1   No current facility-administered medications for this visit.     PHYSICAL EXAMINATION: ECOG PERFORMANCE STATUS: 1 - Symptomatic but completely ambulatory  There were no vitals filed for this visit. There were no vitals filed for this visit.  GENERAL:alert, no distress and comfortable SKIN: skin color, texture, turgor are normal, no rashes or significant lesions EYES: normal, Conjunctiva are pink and non-injected, sclera clear OROPHARYNX:no exudate, no erythema and lips, buccal mucosa, and tongue normal  NECK: supple, thyroid normal size, non-tender, without nodularity LYMPH:  no palpable lymphadenopathy in the cervical, axillary or inguinal LUNGS: clear to auscultation and percussion with normal breathing effort HEART: regular rate & rhythm and no murmurs and no lower extremity edema ABDOMEN:abdomen soft, non-tender and normal bowel sounds MUSCULOSKELETAL:no cyanosis of digits and no clubbing  NEURO: alert & oriented x 3 with fluent speech, no focal motor/sensory deficits EXTREMITIES: No lower extremity edema   LABORATORY DATA:  I have reviewed the data as listed CMP Latest Ref Rng & Units 11/03/2017 10/22/2017 07/17/2017  Glucose 70 - 99 mg/dL 97 142(H) 136  BUN 6 - 23 mg/dL _0 Creatinine 0.40 - 1.20 mg/dL 1.02 1.08(H) 1.15(H)  Sodium 135 - 145 mEq/L 137 139 141  Potassium 3.5 - 5.1 mEq/L 3.9 3.8 3.9  Chloride 96 - 112 mEq/L 103 104 104  CO2 19 - 32 mEq/L _1 Calcium 8.4 - 10.5 mg/dL 9.9 10.3 10.3  Total Protein 6.0 - 8.3 g/dL 8.3 8.0 -  Total Bilirubin 0.2 - 1.2 mg/dL 0.3 0.3 -  Alkaline Phos 39 - 117 U/L 65 74 -  AST 0 - 37 U/L 15 11(L) -  ALT 0 - 35 U/L 22 17 -    Lab Results  Component Value Date   WBC 8.0 10/27/2017   HGB 10.5 (L) 10/27/2017   HCT 32.0 (L) 10/27/2017   MCV 88.3 10/27/2017   PLT 301 10/27/2017   NEUTROABS 5.1 10/27/2017    ASSESSMENT & PLAN:  Normocytic anemia Normocytic anemia hemoglobin 10.5 on 10/27/2017 Felt to be  related to anemia of chronic disease, possibly related to diabetes and hypertension Extensive work-up did not reveal any problems with iron, Z30, folic acid and micronutrients  Bone marrow biopsy 10/27/2017: Trilineage hematopoiesis, no dyspoiesis.  Cytogenetics were normal  Plan: Watchful monitoring every 6 months with labs and follow-up  No orders of the defined types were placed in this encounter.  The patient has a good understanding of the overall plan. she agrees with it. she will call with any problems that may develop before the next visit here.   Harriette Ohara, MD 11/13/17

## 2018-01-02 ENCOUNTER — Other Ambulatory Visit: Payer: Self-pay | Admitting: Internal Medicine

## 2018-01-07 ENCOUNTER — Other Ambulatory Visit: Payer: Self-pay | Admitting: Internal Medicine

## 2018-01-07 DIAGNOSIS — Z794 Long term (current) use of insulin: Principal | ICD-10-CM

## 2018-01-07 DIAGNOSIS — E118 Type 2 diabetes mellitus with unspecified complications: Secondary | ICD-10-CM

## 2018-01-16 ENCOUNTER — Encounter: Payer: Self-pay | Admitting: Family

## 2018-01-16 ENCOUNTER — Other Ambulatory Visit: Payer: 59

## 2018-01-16 ENCOUNTER — Ambulatory Visit (INDEPENDENT_AMBULATORY_CARE_PROVIDER_SITE_OTHER): Payer: 59 | Admitting: Family

## 2018-01-16 VITALS — BP 122/82 | HR 80 | Temp 98.1°F | Ht 65.0 in | Wt 183.0 lb

## 2018-01-16 DIAGNOSIS — R300 Dysuria: Secondary | ICD-10-CM

## 2018-01-16 MED ORDER — NITROFURANTOIN MONOHYD MACRO 100 MG PO CAPS: 100.0000 mg | ORAL_CAPSULE | Freq: Two times a day (BID) | ORAL | 0 refills | Status: DC

## 2018-01-16 NOTE — Progress Notes (Signed)
Carolyn [name] is a 57 y.o. female with the following history as recorded in EpicCare:  Patient Active Problem List   Diagnosis Date Noted  . Normocytic anemia 10/22/2017  . Anemia in other chronic diseases classified elsewhere 07/29/2017  . 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 Slow transit constipation 10/19/2015  . Hypertriglyceridemia 05/25/2015  . Routine general medical examination at a health care facility 05/25/2015  . Visit for screening mammogram 10/27/2014  . Gastric outlet obstruction 03/09/2014  . Left lumbar radiculitis 10/23/2013  . Obesity (BMI 30-39.9) 09/20/2013  . Decreased cardiac ejection fraction 09/17/2013  . Snoring 07/30/2013  . Intermediate coronary syndrome, progressive angina 01/14/2013  . Type II diabetes mellitus with manifestations ([place]) 12/02/2012  . GERD (gastroesophageal reflux disease)   . Hyperlipidemia with target LDL less than 100   . Allergic rhinitis 07/22/2010  . Essential hypertension 05/25/2010    Current Outpatient Medications  Medication Sig Dispense Refill  . blood glucose meter kit and supplies KIT Dispense based on patient and insurance preference. Use up to four times daily as directed. (FOR ICD-9 250.00, 250.01). 1 each 0  . Blood Glucose Monitoring Suppl (ONETOUCH VERIO IQ SYSTEM) W/DEVICE KIT 1 Act by Does not apply route 3 (three) times daily. 2 kit 0  . cetirizine (ZYRTEC) 10 MG chewable tablet Chew 10 mg by mouth daily.    . cloNIDine (CATAPRES) 0.1 MG tablet TAKE 2 TABLETS BY MOUTH ONCE DAILY 60 tablet 5  . EPINEPHrine 0.3 mg/0.3 mL IJ SOAJ injection INJECT 0.3 MLS INTO THE MUSCLE ONCE FOR 1 DOSE  0  . etodolac (LODINE XL) 500 MG 24 hr tablet   0  . fluticasone (FLONASE) 50 MCG/ACT nasal spray Place 2 sprays into both nostrils daily. 48 g 3  . glucose blood (ONE TOUCH ULTRA TEST) test strip Use to test blood sugar three times a week and prn if  having symptoms of low blood sugar Dx: 250.92 90 day supply 100 each 11  . glucose blood (ONETOUCH VERIO) test strip Use TID 100 each 12  . Insulin Pen Needle (NOVOFINE) 32G X 6 MM MISC 1 Act by Does not apply route daily. 100 each 3  . labetalol (NORMODYNE) 300 MG tablet Take 1 tablet (300 mg total) by mouth 2 (two) times daily. 180 tablet 3  . metFORMIN (GLUCOPHAGE-XR) 500 MG 24 hr tablet Take 1 tablet (500 mg total) by mouth daily after supper. 30 tablet 2  . metFORMIN (GLUCOPHAGE-XR) 750 MG 24 hr tablet TAKE 1 TABLET BY MOUTH ONCE DAILY WITH BREAKFAST  1  . omega-3 acid ethyl esters (LOVAZA) 1 g capsule Take 2 capsules (2 g total) by mouth 2 (two) times daily. 360 capsule 1  . omeprazole (PRILOSEC) 40 MG capsule TAKE 1 CAPSULE BY MOUTH TWICE DAILY AT 8AM AND 10PM 180 capsule 1  . simvastatin (ZOCOR) 40 MG tablet Take 1 tablet (40 mg total) by mouth at bedtime. 90 tablet 3  . Suvorexant (BELSOMRA) 15 MG TABS Take 1 tablet by mouth at bedtime as needed. 30 tablet 5  . telmisartan (MICARDIS) 80 MG tablet TALE 1 TABLET BY MOUTH DAILY 90 tablet 1  . thiamine (VITAMIN B-1) 100 MG tablet Take 1 tablet (100 mg total) by mouth daily. 90 tablet 3  . triamcinolone ointment (KENALOG) 0.1 % APPLY OINTMENT TO SKIN TWICE DAILY  0  . triamterene-hydrochlorothiazide (DYAZIDE) 37.5-25 MG capsule TAKE 1 CAPSULE BY MOUTH  ONCE DAILY 30 capsule 5  . VICTOZA 18 MG/3ML SOPN INJECT 1.8 MG INTO THE SKIN DAILY 9 mL 1  . nitrofurantoin, macrocrystal-monohydrate, (MACROBID) 100 MG capsule Take 1 capsule (100 mg total) by mouth 2 (two) times daily. 14 capsule 0   No current facility-administered medications for this visit.     Allergies: Invokana [canagliflozin]; Codeine; Flexeril [cyclobenzaprine]; Morphine and related; and Reglan [metoclopramide]  Past Medical History:  Diagnosis Date  . Allergy    seasonal  . Anemia   . Anxiety attack   . Arthritis   . Benign fundic gland polyps of stomach   . Dyslipidemia   .  Exertional shortness of breath   . GERD (gastroesophageal reflux disease)   . Heart murmur   . Hyperlipidemia   . Hypertension   . Migraines    "probably weekly" (01/14/2013)  . Type II diabetes mellitus ([place])     Past Surgical History:  Procedure Laterality Date  . CHOLECYSTECTOMY  1990  . COLONOSCOPY    . ENDOMETRIAL ABLATION  ~ 2000-2002   "twice" (01/14/2013)  . LEFT HEART CATHETERIZATION WITH CORONARY ANGIOGRAM N/A 01/15/2013   Procedure: LEFT HEART CATHETERIZATION WITH CORONARY ANGIOGRAM;  Surgeon: Blane Ohara, MD;  Location: [facility] CATH LAB;  Service: Cardiovascular;  Laterality: N/A;  . LEFT OOPHORECTOMY Left ~ 2003  . ORIF TIBIA & FIBULA FRACTURES Left 2003  . VAGINAL HYSTERECTOMY  ~ 2003    Family History  Problem Relation Age of Onset  . Alzheimer's disease Mother   . Hypertension Mother   . Emphysema Father   . Thyroid cancer Sister   . Cancer Sister        Breast Cancer  . Diabetes Sister   . Diabetes Sister   . Emphysema Sister   . Diabetes Brother   . Colon cancer Neg Hx     Social History   Tobacco Use  . Smoking status: Never Smoker  . Smokeless tobacco: Never Used  Substance Use Topics  . Alcohol use: No    Alcohol/week: 0.0 standard drinks    Comment: socially    Subjective:  Patient presents with symptoms of UTI x 1 day; urinary frequency, low back pain, sense of pelvic pressure; no fever; no blood in urine;   Objective:  Vitals:   01/16/18 1355  BP: 122/82  Pulse: 80  Temp: 98.1 F (36.7 C)  TempSrc: Oral  SpO2: 98%  Weight: 183 lb (83 kg)  Height: _0  (1.651 m)    General: Well developed, well nourished, in no acute distress  Skin : Warm and dry.  Head: Normocephalic and atraumatic  Lungs: Respirations unlabored; clear to auscultation bilaterally without wheeze, rales, rhonchi  CVS exam: normal rate and regular rhythm.  Abdomen: Soft; nontender;  Musculoskeletal: No deformities; negative CVA tenderness  Extremities: No edema,  cyanosis, clubbing  Vessels: Symmetric bilaterally  Neurologic: Alert and oriented; speech intact; face symmetrical; moves all extremities well; CNII-XII intact without focal deficit   Assessment:  1. Dysuria     Plan:  Suspect UTI; check U/A and urine culture; Rx for Macrobid 100 mg bid x 7 days; follow-up to be determined.    No follow-ups on file.  Orders Placed This Encounter  Procedures  . Urine Culture    Standing Status:   Future    Number of Occurrences:   1    Standing Expiration Date:   01/16/2019    Requested Prescriptions   Signed Prescriptions Disp Refills  . nitrofurantoin,  macrocrystal-monohydrate, (MACROBID) 100 MG capsule 14 capsule 0    Sig: Take 1 capsule (100 mg total) by mouth 2 (two) times daily.

## 2018-01-17 LAB — URINE CULTURE
MICRO NUMBER: 91195687
SPECIMEN QUALITY: ADEQUATE

## 2018-01-19 ENCOUNTER — Other Ambulatory Visit: Payer: Self-pay | Admitting: Family

## 2018-01-19 DIAGNOSIS — R109 Unspecified abdominal pain: Secondary | ICD-10-CM

## 2018-01-29 ENCOUNTER — Ambulatory Visit (INDEPENDENT_AMBULATORY_CARE_PROVIDER_SITE_OTHER)
Admission: RE | Admit: 2018-01-29 | Discharge: 2018-01-29 | Disposition: A | Discharge status: Home / Self Care | Payer: 59 | Source: Ambulatory Visit | Attending: Family | Admitting: Family

## 2018-01-29 DIAGNOSIS — R109 Unspecified abdominal pain: Secondary | ICD-10-CM | POA: Diagnosis not present

## 2018-01-30 NOTE — Telephone Encounter (Signed)
error 

## 2018-02-04 ENCOUNTER — Other Ambulatory Visit: Payer: Self-pay | Admitting: Internal Medicine

## 2018-02-04 DIAGNOSIS — E781 Pure hyperglyceridemia: Secondary | ICD-10-CM

## 2018-02-05 ENCOUNTER — Ambulatory Visit: Payer: Self-pay | Admitting: *Deleted

## 2018-02-05 NOTE — Telephone Encounter (Signed)
Pt calling today stating that after having a formed stool she noted that the toilet paper was pinkish red in color. Pt states she also has a episode of blood in her stools on Monday. Pt states it was a lot of blood in her stool on Monday but states she did not look in the stool to see if blood was in the stool because it makes her sick on the stomach.  Pt states she has a history of constipation and has been off her Miralax for a week prior to Monday. Pt also states she is on Linzess and did not take her dose on Saturday and Sunday due to being " out and about" and started taking it again on Monday. Pt reports that she has been having nausea and lower back pain that moves from the right side to the middle of her back.Pt mentions that she is a diabetic and only had a snack to eat today and has not had lunch yet. Pt encouraged to make sure she is eating and taking her medications as directed by physician. Pt scheduled for appt on 10/25. Pt advised that if symptoms became worse or is she experienced more blood in her stools to seek treatment in the ED. Pt verbalized understanding.  Reason for Disposition . MODERATE rectal bleeding (small blood clots, passing blood without stool, or toilet water turns red)  Answer Assessment - Initial Assessment Questions 1. APPEARANCE of BLOOD: "What color is it?" "Is it passed separately, on the surface of the stool, or mixed in with the stool?"      Solid stool today and states when she wiped today she noticed a pink red color on the toilet tissue, but it was not dark red 2. AMOUNT: "How much blood was passed?"      Just on the toilet tissue today 3. FREQUENCY: "How many times has blood been passed with the stools?"      twice 4. ONSET: "When was the blood first seen in the stools?" (Days or weeks)      On Monday 5. DIARRHEA: "Is there also some diarrhea?" If so, ask: "How many diarrhea stools were passed in past 24 hours?"      No 6. CONSTIPATION: "Do you have  constipation?" If so, "How bad is it?"     yes 7. RECURRENT SYMPTOMS: "Have you had blood in your stools before?" If so, ask: "When was the last time?" and "What happened that time?"      Yes, but the beginning of the year but did not get it anymore. Pt states she passed a lot of blood on Monday but did not look in the toilet to see the stool. Pt states that looking in the toilet at the stool makes her sick 8. BLOOD THINNERS: "Do you take any blood thinners?" (e.g., Coumadin/warfarin, Pradaxa/dabigatran, aspirin)     Aspirin-low dose 9. OTHER SYMPTOMS: "Do you have any other symptoms?"  (e.g., abdominal pain, vomiting, dizziness, fever)     Back pain and nausea last night 10. PREGNANCY: "Is there any chance you are pregnant?" "When was your last menstrual period?"       n/a  Protocols used: RECTAL BLEEDING-A-AH

## 2018-02-06 ENCOUNTER — Ambulatory Visit (INDEPENDENT_AMBULATORY_CARE_PROVIDER_SITE_OTHER)
Admission: RE | Admit: 2018-02-06 | Discharge: 2018-02-06 | Disposition: A | Discharge status: Home / Self Care | Payer: 59 | Source: Ambulatory Visit | Attending: Family | Admitting: Family

## 2018-02-06 ENCOUNTER — Ambulatory Visit (INDEPENDENT_AMBULATORY_CARE_PROVIDER_SITE_OTHER): Payer: 59 | Admitting: Family

## 2018-02-06 ENCOUNTER — Encounter: Payer: Self-pay | Admitting: Family

## 2018-02-06 VITALS — BP 128/74 | HR 85 | Temp 98.2°F | Ht 65.0 in | Wt 183.0 lb

## 2018-02-06 DIAGNOSIS — M545 Low back pain, unspecified: Secondary | ICD-10-CM

## 2018-02-06 DIAGNOSIS — K5901 Slow transit constipation: Secondary | ICD-10-CM | POA: Diagnosis not present

## 2018-02-06 MED ORDER — LINACLOTIDE 290 MCG PO CAPS: 290.0000 ug | ORAL_CAPSULE | Freq: Every day | ORAL | 1 refills | Status: DC

## 2018-02-06 NOTE — Progress Notes (Signed)
Carolyn [name] is a 57 y.o. female with the following history as recorded in EpicCare:  Patient Active Problem List   Diagnosis Date Noted  . Normocytic anemia 10/22/2017  . Anemia in other chronic diseases classified elsewhere 07/29/2017  . 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 Slow transit constipation 10/19/2015  . Hypertriglyceridemia 05/25/2015  . Routine general medical examination at a health care facility 05/25/2015  . Visit for screening mammogram 10/27/2014  . Gastric outlet obstruction 03/09/2014  . Left lumbar radiculitis 10/23/2013  . Obesity (BMI 30-39.9) 09/20/2013  . Decreased cardiac ejection fraction 09/17/2013  . Snoring 07/30/2013  . Intermediate coronary syndrome, progressive angina 01/14/2013  . Type II diabetes mellitus with manifestations ([place]) 12/02/2012  . GERD (gastroesophageal reflux disease)   . Hyperlipidemia with target LDL less than 100   . Allergic rhinitis 07/22/2010  . Essential hypertension 05/25/2010    Current Outpatient Medications  Medication Sig Dispense Refill  . blood glucose meter kit and supplies KIT Dispense based on patient and insurance preference. Use up to four times daily as directed. (FOR ICD-9 250.00, 250.01). 1 each 0  . Blood Glucose Monitoring Suppl (ONETOUCH VERIO IQ SYSTEM) W/DEVICE KIT 1 Act by Does not apply route 3 (three) times daily. 2 kit 0  . cetirizine (ZYRTEC) 10 MG chewable tablet Chew 10 mg by mouth daily.    . cloNIDine (CATAPRES) 0.1 MG tablet TAKE 2 TABLETS BY MOUTH ONCE DAILY 60 tablet 5  . EPINEPHrine 0.3 mg/0.3 mL IJ SOAJ injection INJECT 0.3 MLS INTO THE MUSCLE ONCE FOR 1 DOSE  0  . etodolac (LODINE XL) 500 MG 24 hr tablet   0  . fluticasone (FLONASE) 50 MCG/ACT nasal spray Place 2 sprays into both nostrils daily. 48 g 3  . glucose blood (ONE TOUCH ULTRA TEST) test strip Use to test blood sugar three times a week and prn if  having symptoms of low blood sugar Dx: 250.92 90 day supply 100 each 11  . glucose blood (ONETOUCH VERIO) test strip Use TID 100 each 12  . Insulin Pen Needle (NOVOFINE) 32G X 6 MM MISC 1 Act by Does not apply route daily. 100 each 3  . labetalol (NORMODYNE) 300 MG tablet Take 1 tablet (300 mg total) by mouth 2 (two) times daily. 180 tablet 3  . Lancets (ONETOUCH DELICA PLUS [id number]) MISC USE UP TO 4 TIMES DAILY AS DIRECTED  1  . metFORMIN (GLUCOPHAGE-XR) 500 MG 24 hr tablet Take 1 tablet (500 mg total) by mouth daily after supper. 30 tablet 2  . omega-3 acid ethyl esters (LOVAZA) 1 g capsule Take 2 capsules (2 g total) by mouth 2 (two) times daily. 360 capsule 1  . omeprazole (PRILOSEC) 40 MG capsule TAKE 1 CAPSULE BY MOUTH TWICE DAILY AT 8AM AND 10PM 180 capsule 1  . simvastatin (ZOCOR) 40 MG tablet Take 1 tablet (40 mg total) by mouth at bedtime. 90 tablet 3  . Suvorexant (BELSOMRA) 15 MG TABS Take 1 tablet by mouth at bedtime as needed. 30 tablet 5  . telmisartan (MICARDIS) 80 MG tablet TALE 1 TABLET BY MOUTH DAILY 90 tablet 1  . thiamine (VITAMIN B-1) 100 MG tablet Take 1 tablet (100 mg total) by mouth daily. 90 tablet 3  . triamcinolone ointment (KENALOG) 0.1 % APPLY OINTMENT TO SKIN TWICE DAILY  0  . triamterene-hydrochlorothiazide (DYAZIDE) 37.5-25 MG capsule TAKE 1 CAPSULE BY MOUTH ONCE DAILY  30 capsule 5  . VASCEPA 1 g CAPS TAKE 2 CAPSULES BY MOUTH TWICE DAILY 360 capsule 1  . VICTOZA 18 MG/3ML SOPN INJECT 1.8 MG INTO THE SKIN DAILY 9 mL 1  . linaclotide (LINZESS) 290 MCG CAPS capsule Take 1 capsule (290 mcg total) by mouth daily before breakfast. 90 capsule 1   No current facility-administered medications for this visit.     Allergies: Invokana [canagliflozin]; Codeine; Flexeril [cyclobenzaprine]; Morphine and related; and Reglan [metoclopramide]  Past Medical History:  Diagnosis Date  . Allergy    seasonal  . Anemia   . Anxiety attack   . Arthritis   . Benign fundic gland  polyps of stomach   . Dyslipidemia   . Exertional shortness of breath   . GERD (gastroesophageal reflux disease)   . Heart murmur   . Hyperlipidemia   . Hypertension   . Migraines    "probably weekly" (01/14/2013)  . Type II diabetes mellitus ([place])     Past Surgical History:  Procedure Laterality Date  . CHOLECYSTECTOMY  1990  . COLONOSCOPY    . ENDOMETRIAL ABLATION  ~ 2000-2002   "twice" (01/14/2013)  . LEFT HEART CATHETERIZATION WITH CORONARY ANGIOGRAM N/A 01/15/2013   Procedure: LEFT HEART CATHETERIZATION WITH CORONARY ANGIOGRAM;  Surgeon: Blane Ohara, MD;  Location: [facility] CATH LAB;  Service: Cardiovascular;  Laterality: N/A;  . LEFT OOPHORECTOMY Left ~ 2003  . ORIF TIBIA & FIBULA FRACTURES Left 2003  . VAGINAL HYSTERECTOMY  ~ 2003    Family History  Problem Relation Age of Onset  . Alzheimer's disease Mother   . Hypertension Mother   . Emphysema Father   . Thyroid cancer Sister   . Cancer Sister        Breast Cancer  . Diabetes Sister   . Diabetes Sister   . Emphysema Sister   . Diabetes Brother   . Colon cancer Neg Hx     Social History   Tobacco Use  . Smoking status: Never Smoker  . Smokeless tobacco: Never Used  Substance Use Topics  . Alcohol use: No    Alcohol/week: 0.0 standard drinks    Comment: socially    Subjective:  Patient presents with concerns for recent episode of chronic constipation; had to stop the Linzess this past weekend and feels like she "just messed her system" up; became concerned when she had some pinkish blood when she wiped earlier this week; is up to date on her colonoscopy; has actually started to have some diarrhea as of today; Continuing to complain of low back pain- more noticeable with movement; history of hysterectomy- does have one ovary; normal appearance on recent CT;     Objective:  Vitals:   02/06/18 1410  BP: 128/74  Pulse: 85  Temp: 98.2 F (36.8 C)  TempSrc: Oral  SpO2: 96%  Weight: 183 lb (83 kg)  Height:  '5\' 5"'  (1.651 m)    General: Well developed, well nourished, in no acute distress  Skin : Warm and dry.  Head: Normocephalic and atraumatic  Lungs: Respirations unlabored; clear to auscultation bilaterally without wheeze, rales, rhonchi  CVS exam: normal rate, regular rhythm, normal S1, S2, no murmurs, rubs, clicks or gallops, normal rate and regular rhythm.  Abdomen: Soft; nontender; nondistended; normoactive bowel sounds; no masses or hepatosplenomegaly  Musculoskeletal: No deformities; no active joint inflammation  Extremities: No edema, cyanosis, clubbing  Vessels: Symmetric bilaterally  Neurologic: Alert and oriented; speech intact; face symmetrical; moves all extremities well; CNII-XII  intact without focal deficit   Assessment:  1. Bilateral low back pain without sciatica, unspecified chronicity   2. Slow transit constipation     Plan:  1. Update lumbar X-ray; may need to refer to PT; 2. Will update X-ray to rule out obstruction; encouraged to take her Linzess daily; do not recommend taking both Linzess and Miralax however; follow-up to be determined.   No follow-ups on file.  Orders Placed This Encounter  Procedures  . DG Abd 2 Views    Standing Status:   Future    Number of Occurrences:   1    Standing Expiration Date:   04/09/2019    Order Specific Question:   Reason for Exam (SYMPTOM  OR DIAGNOSIS REQUIRED)    Answer:   constipation/ abdominal pain    Order Specific Question:   Is patient pregnant?    Answer:   No    Order Specific Question:   Preferred imaging location?    Answer:   [facility]    Order Specific Question:   Radiology Contrast Protocol - do NOT remove file path    Answer:   [email or web address]  . DG Lumbar Spine 2-3 Views    Standing Status:   Future    Number of Occurrences:   1    Standing Expiration Date:   04/09/2019    Order Specific Question:   Reason for Exam (SYMPTOM  OR DIAGNOSIS REQUIRED)     Answer:   low back pain    Order Specific Question:   Is patient pregnant?    Answer:   No    Order Specific Question:   Preferred imaging location?    Answer:   [facility]    Order Specific Question:   Radiology Contrast Protocol - do NOT remove file path    Answer:   [email or web address]    Requested Prescriptions   Signed Prescriptions Disp Refills  . linaclotide (LINZESS) 290 MCG CAPS capsule 90 capsule 1    Sig: Take 1 capsule (290 mcg total) by mouth daily before breakfast.

## 2018-02-09 ENCOUNTER — Encounter: Payer: Self-pay | Admitting: Family

## 2018-02-09 ENCOUNTER — Other Ambulatory Visit: Payer: Self-pay | Admitting: Family

## 2018-02-09 DIAGNOSIS — K625 Hemorrhage of anus and rectum: Secondary | ICD-10-CM

## 2018-02-09 NOTE — Telephone Encounter (Signed)
FYI

## 2018-02-11 ENCOUNTER — Telehealth: Payer: Self-pay

## 2018-02-11 NOTE — Telephone Encounter (Signed)
PA for Vascepa has been approved. Pt informed of same.

## 2018-02-13 ENCOUNTER — Ambulatory Visit: Payer: 59 | Admitting: Family

## 2018-02-18 ENCOUNTER — Other Ambulatory Visit: Payer: Self-pay | Admitting: Internal Medicine

## 2018-02-26 ENCOUNTER — Other Ambulatory Visit: Payer: Self-pay | Admitting: Internal Medicine

## 2018-02-26 DIAGNOSIS — Z794 Long term (current) use of insulin: Principal | ICD-10-CM

## 2018-02-26 DIAGNOSIS — E118 Type 2 diabetes mellitus with unspecified complications: Secondary | ICD-10-CM

## 2018-03-04 ENCOUNTER — Other Ambulatory Visit: Payer: Self-pay | Admitting: Internal Medicine

## 2018-03-04 DIAGNOSIS — I10 Essential (primary) hypertension: Secondary | ICD-10-CM

## 2018-03-04 DIAGNOSIS — E118 Type 2 diabetes mellitus with unspecified complications: Secondary | ICD-10-CM

## 2018-03-10 ENCOUNTER — Other Ambulatory Visit: Payer: Self-pay | Admitting: Internal Medicine

## 2018-03-10 DIAGNOSIS — E118 Type 2 diabetes mellitus with unspecified complications: Secondary | ICD-10-CM

## 2018-03-10 DIAGNOSIS — Z794 Long term (current) use of insulin: Principal | ICD-10-CM

## 2018-03-16 ENCOUNTER — Other Ambulatory Visit: Payer: Self-pay | Admitting: Internal Medicine

## 2018-03-20 ENCOUNTER — Ambulatory Visit (INDEPENDENT_AMBULATORY_CARE_PROVIDER_SITE_OTHER): Payer: 59 | Admitting: Internal Medicine

## 2018-03-20 ENCOUNTER — Encounter: Payer: Self-pay | Admitting: Internal Medicine

## 2018-03-20 VITALS — BP 110/74 | HR 80 | Ht 64.0 in | Wt 182.0 lb

## 2018-03-20 DIAGNOSIS — K5901 Slow transit constipation: Secondary | ICD-10-CM

## 2018-03-20 DIAGNOSIS — K649 Unspecified hemorrhoids: Secondary | ICD-10-CM | POA: Diagnosis not present

## 2018-03-20 MED ORDER — HYDROCORTISONE 2.5 % RE CREA: 1.0000 "application " | TOPICAL_CREAM | Freq: Two times a day (BID) | RECTAL | 1 refills | Status: DC | PRN
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
## 2018-03-20 NOTE — Patient Instructions (Signed)
We have sent the following medications to your pharmacy for you to pick up at your convenience:  Hydrocortisone cream - use as needed.  Stay on your Linzess

## 2018-03-25 ENCOUNTER — Other Ambulatory Visit: Payer: Self-pay | Admitting: Internal Medicine

## 2018-04-14 ENCOUNTER — Other Ambulatory Visit: Payer: Self-pay | Admitting: Family

## 2018-04-14 ENCOUNTER — Telehealth: Payer: Self-pay | Admitting: Emergency Medicine

## 2018-04-14 ENCOUNTER — Other Ambulatory Visit: Payer: Self-pay | Admitting: Internal Medicine

## 2018-04-14 DIAGNOSIS — E118 Type 2 diabetes mellitus with unspecified complications: Secondary | ICD-10-CM

## 2018-04-14 DIAGNOSIS — Z794 Long term (current) use of insulin: Principal | ICD-10-CM

## 2018-04-14 DIAGNOSIS — Z0279 Encounter for issue of other medical certificate: Secondary | ICD-10-CM

## 2018-04-14 NOTE — Telephone Encounter (Signed)
Pt dropped FMLA paperwork off. Placed in Brittanys box to be filled out.

## 2018-04-14 NOTE — Telephone Encounter (Signed)
Forms have been completed for renewal and place in providers box to sign.

## 2018-04-17 NOTE — Telephone Encounter (Signed)
Forms have been signed, Faxed to [facility] @ [phone], Copy sent to scan &charged for.   LVM to informed patient and let her know the original is ready to be picked up unless she would like for them mailed to her.

## 2018-05-29 ENCOUNTER — Other Ambulatory Visit: Payer: Self-pay | Admitting: Internal Medicine

## 2018-05-29 DIAGNOSIS — E118 Type 2 diabetes mellitus with unspecified complications: Secondary | ICD-10-CM

## 2018-05-29 DIAGNOSIS — Z794 Long term (current) use of insulin: Principal | ICD-10-CM

## 2018-06-02 ENCOUNTER — Other Ambulatory Visit: Payer: Self-pay | Admitting: Internal Medicine

## 2018-06-02 DIAGNOSIS — E118 Type 2 diabetes mellitus with unspecified complications: Secondary | ICD-10-CM

## 2018-06-02 DIAGNOSIS — Z794 Long term (current) use of insulin: Principal | ICD-10-CM

## 2018-06-03 ENCOUNTER — Other Ambulatory Visit: Payer: Self-pay | Admitting: Internal Medicine

## 2018-06-03 DIAGNOSIS — E118 Type 2 diabetes mellitus with unspecified complications: Secondary | ICD-10-CM

## 2018-06-03 DIAGNOSIS — I10 Essential (primary) hypertension: Secondary | ICD-10-CM

## 2018-06-15 ENCOUNTER — Ambulatory Visit (INDEPENDENT_AMBULATORY_CARE_PROVIDER_SITE_OTHER): Payer: 59 | Admitting: Family

## 2018-06-15 ENCOUNTER — Other Ambulatory Visit (INDEPENDENT_AMBULATORY_CARE_PROVIDER_SITE_OTHER): Payer: 59

## 2018-06-15 ENCOUNTER — Ambulatory Visit (INDEPENDENT_AMBULATORY_CARE_PROVIDER_SITE_OTHER)
Admission: RE | Admit: 2018-06-15 | Discharge: 2018-06-15 | Disposition: A | Discharge status: Home / Self Care | Payer: 59 | Source: Ambulatory Visit | Attending: Family | Admitting: Family

## 2018-06-15 ENCOUNTER — Encounter: Payer: Self-pay | Admitting: Family

## 2018-06-15 VITALS — BP 120/84 | HR 91 | Temp 97.9°F | Ht 64.0 in | Wt 182.0 lb

## 2018-06-15 DIAGNOSIS — M25532 Pain in left wrist: Secondary | ICD-10-CM

## 2018-06-15 DIAGNOSIS — E118 Type 2 diabetes mellitus with unspecified complications: Secondary | ICD-10-CM

## 2018-06-15 LAB — CBC WITH DIFFERENTIAL/PLATELET
BASOS PCT: 0.4 % (ref 0.0–3.0)
Basophils Absolute: 0 10*3/uL (ref 0.0–0.1)
EOS PCT: 2.2 % (ref 0.0–5.0)
Eosinophils Absolute: 0.2 10*3/uL (ref 0.0–0.7)
HEMATOCRIT: 30.8 % — AB (ref 36.0–46.0)
Hemoglobin: 10.5 g/dL — ABNORMAL LOW (ref 12.0–15.0)
LYMPHS ABS: 2.8 10*3/uL (ref 0.7–4.0)
Lymphocytes Relative: 35.3 % (ref 12.0–46.0)
MCHC: 34.1 g/dL (ref 30.0–36.0)
MCV: 87.3 fl (ref 78.0–100.0)
Monocytes Absolute: 0.5 10*3/uL (ref 0.1–1.0)
Monocytes Relative: 6.1 % (ref 3.0–12.0)
NEUTROS ABS: 4.4 10*3/uL (ref 1.4–7.7)
NEUTROS PCT: 56 % (ref 43.0–77.0)
PLATELETS: 317 10*3/uL (ref 150.0–400.0)
RBC: 3.53 Mil/uL — ABNORMAL LOW (ref 3.87–5.11)
RDW: 13.3 % (ref 11.5–15.5)
WBC: 7.9 10*3/uL (ref 4.0–10.5)

## 2018-06-15 LAB — LIPID PANEL
CHOL/HDL RATIO: 5
Cholesterol: 161 mg/dL (ref 0–200)
HDL: 34.4 mg/dL — AB (ref >39.00)
NONHDL: 126.9
TRIGLYCERIDES: 298 mg/dL — AB (ref 0.0–149.0)
VLDL: 59.6 mg/dL — AB (ref 0.0–40.0)

## 2018-06-15 LAB — COMPREHENSIVE METABOLIC PANEL
ALT: 24 U/L (ref 0–35)
AST: 14 U/L (ref 0–37)
Albumin: 4.7 g/dL (ref 3.5–5.2)
Alkaline Phosphatase: 76 U/L (ref 39–117)
BUN: 20 mg/dL (ref 6–23)
CHLORIDE: 102 meq/L (ref 96–112)
CO2: 25 meq/L (ref 19–32)
Calcium: 9.8 mg/dL (ref 8.4–10.5)
Creatinine, Ser: 1.01 mg/dL (ref 0.40–1.20)
GFR: 68.52 mL/min (ref >60.00)
GLUCOSE: 101 mg/dL — AB (ref 70–99)
POTASSIUM: 4 meq/L (ref 3.5–5.1)
Sodium: 137 mEq/L (ref 135–145)
Total Bilirubin: 0.4 mg/dL (ref 0.2–1.2)
Total Protein: 7.9 g/dL (ref 6.0–8.3)

## 2018-06-15 LAB — HEMOGLOBIN A1C: Hgb A1c MFr Bld: 6.2 % (ref 4.6–6.5)

## 2018-06-15 LAB — LDL CHOLESTEROL, DIRECT: Direct LDL: 81 mg/dL

## 2018-06-15 MED ORDER — MELOXICAM 15 MG PO TABS: 15.0000 mg | ORAL_TABLET | Freq: Every day | ORAL | 0 refills | Status: DC

## 2018-06-15 NOTE — Progress Notes (Signed)
Carolyn [name] is a 58 y.o. female with the following history as recorded in EpicCare:  Patient Active Problem List   Diagnosis Date Noted  . Normocytic anemia 10/22/2017  . Anemia in other chronic diseases classified elsewhere 07/29/2017  . 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 Slow transit constipation 10/19/2015  . Hypertriglyceridemia 05/25/2015  . Routine general medical examination at a health care facility 05/25/2015  . Visit for screening mammogram 10/27/2014  . Left lumbar radiculitis 10/23/2013  . Obesity (BMI 30-39.9) 09/20/2013  . Decreased cardiac ejection fraction 09/17/2013  . Snoring 07/30/2013  . Intermediate coronary syndrome, progressive angina 01/14/2013  . Type II diabetes mellitus with manifestations ([place]) 12/02/2012  . GERD (gastroesophageal reflux disease)   . Hyperlipidemia with target LDL less than 100   . Allergic rhinitis 07/22/2010  . Essential hypertension 05/25/2010    Current Outpatient Medications  Medication Sig Dispense Refill  . BD PEN NEEDLE MICRO U/F 32G X 6 MM MISC USE 1 DAILY 100 each 3  . blood glucose meter kit and supplies KIT Dispense based on patient and insurance preference. Use up to four times daily as directed. (FOR ICD-9 250.00, 250.01). 1 each 0  . Blood Glucose Monitoring Suppl (ONETOUCH VERIO IQ SYSTEM) W/DEVICE KIT 1 Act by Does not apply route 3 (three) times daily. 2 kit 0  . cetirizine (ZYRTEC) 10 MG chewable tablet Chew 10 mg by mouth daily.    . cloNIDine (CATAPRES) 0.1 MG tablet TAKE 2 TABLETS BY MOUTH ONCE DAILY 60 tablet 5  . EPINEPHrine 0.3 mg/0.3 mL IJ SOAJ injection INJECT 0.3 MLS INTO THE MUSCLE ONCE FOR 1 DOSE  0  . etodolac (LODINE XL) 500 MG 24 hr tablet   0  . fluticasone (FLONASE) 50 MCG/ACT nasal spray Place 2 sprays into both nostrils daily. 48 g 3  . glucose blood (ONE TOUCH ULTRA TEST) test strip Use to test blood sugar three  times a week and prn if having symptoms of low blood sugar Dx: 250.92 90 day supply 100 each 11  . glucose blood (ONETOUCH VERIO) test strip Use TID 100 each 12  . hydrocortisone (ANUSOL-HC) 2.5 % rectal cream Place 1 application rectally 2 (two) times daily as needed for hemorrhoids. 30 g 1  . labetalol (NORMODYNE) 300 MG tablet Take 1 tablet (300 mg total) by mouth 2 (two) times daily. 180 tablet 3  . Lancets (ONETOUCH DELICA PLUS [id number]) MISC USE UP TO 4 TIMES DAILY AS DIRECTED  1  . linaclotide (LINZESS) 290 MCG CAPS capsule Take 1 capsule (290 mcg total) by mouth daily before breakfast. 90 capsule 1  . metFORMIN (GLUCOPHAGE-XR) 500 MG 24 hr tablet TAKE 1 TABLET BY MOUTH ONCE DAILY AFTER  SUPPER 30 tablet 1  . omega-3 acid ethyl esters (LOVAZA) 1 g capsule Take 2 capsules (2 g total) by mouth 2 (two) times daily. 360 capsule 1  . omeprazole (PRILOSEC) 40 MG capsule TAKE 1 CAPSULE BY MOUTH TWICE DAILY AT 8AM AND 10PM (Patient taking differently: TAKE 1 CAPSULE BY MOUTH ONCE DAILY) 180 capsule 1  . simvastatin (ZOCOR) 40 MG tablet TAKE 1 TABLET BY MOUTH AT BEDTIME 90 tablet 1  . Suvorexant (BELSOMRA) 15 MG TABS Take 1 tablet by mouth at bedtime as needed. 30 tablet 5  . telmisartan (MICARDIS) 80 MG tablet TAKE 1 TABLET BY MOUTH ONCE DAILY 90 tablet 0  . thiamine (VITAMIN B-1) 100 MG  tablet Take 1 tablet (100 mg total) by mouth daily. 90 tablet 3  . triamcinolone ointment (KENALOG) 0.1 % APPLY OINTMENT TO SKIN TWICE DAILY  0  . triamterene-hydrochlorothiazide (DYAZIDE) 37.5-25 MG capsule TAKE 1 CAPSULE BY MOUTH ONCE DAILY 30 capsule 2  . VASCEPA 1 g CAPS TAKE 2 CAPSULES BY MOUTH TWICE DAILY 360 capsule 1  . VICTOZA 18 MG/3ML SOPN INJECT 1.8MG INTO THE SKIN ONCE DAILY 9 mL 0  . meloxicam (MOBIC) 15 MG tablet Take 1 tablet (15 mg total) by mouth daily. 30 tablet 0   No current facility-administered medications for this visit.     Allergies: Invokana [canagliflozin]; Codeine; Flexeril  [cyclobenzaprine]; Morphine and related; Reglan [metoclopramide]; and Silicon  Past Medical History:  Diagnosis Date  . Allergy    seasonal  . Anemia    Anemia of chronic disease suspected  . Anxiety attack   . Arthritis   . Benign fundic gland polyps of stomach   . Dyslipidemia   . Exertional shortness of breath   . GERD (gastroesophageal reflux disease)   . Heart murmur   . Hyperlipidemia   . Hypertension   . Migraines    "probably weekly" (01/14/2013)  . Type II diabetes mellitus ([place])     Past Surgical History:  Procedure Laterality Date  . CHOLECYSTECTOMY  1990  . COLONOSCOPY    . ENDOMETRIAL ABLATION  ~ 2000-2002   "twice" (01/14/2013)  . LEFT HEART CATHETERIZATION WITH CORONARY ANGIOGRAM N/A 01/15/2013   Procedure: LEFT HEART CATHETERIZATION WITH CORONARY ANGIOGRAM;  Surgeon: Blane Ohara, MD;  Location: [facility] CATH LAB;  Service: Cardiovascular;  Laterality: N/A;  . LEFT OOPHORECTOMY Left ~ 2003  . ORIF TIBIA & FIBULA FRACTURES Left 2003  . VAGINAL HYSTERECTOMY  ~ 2003    Family History  Problem Relation Age of Onset  . Alzheimer's disease Mother   . Hypertension Mother   . Emphysema Father   . Thyroid cancer Sister   . Cancer Sister        Breast Cancer  . Diabetes Sister   . Diabetes Sister   . Emphysema Sister   . Diabetes Brother   . Colon cancer Neg Hx     Social History   Tobacco Use  . Smoking status: Never Smoker  . Smokeless tobacco: Never Used  Substance Use Topics  . Alcohol use: No    Alcohol/week: 0.0 standard drinks    Comment: socially    Subjective:  2 week history of left wrist pain; no known injury of trauma; feels "bony prominence over left wrist." No numbness or tingling into finger tips; works on a computer- job involves repetitive motion; has used brace recently with relief;  Also wants to get labs updated for her diabetes; last Hgba1c checked in July 2019; overdue to see her PCP- plans to schedule for CPE next month; denies any  concerns for episodes of low blood sugar; Denies any chest pain, shortness of breath, blurred vision or headache.     Objective:  Vitals:   06/15/18 1112  BP: 120/84  Pulse: 91  Temp: 97.9 F (36.6 C)  TempSrc: Oral  SpO2: 96%  Weight: 182 lb 0.6 oz (82.6 kg)  Height: '5\' 4"'  (1.626 m)    General: Well developed, well nourished, in no acute distress  Skin : Warm and dry.  Head: Normocephalic and atraumatic  Eyes: Sclera and conjunctiva clear; pupils round and reactive to light; extraocular movements intact  Ears: External normal; canals clear; tympanic  membranes normal  Oropharynx: Pink, supple. No suspicious lesions  Neck: Supple without thyromegaly, adenopathy  Lungs: Respirations unlabored; clear to auscultation bilaterally without wheeze, rales, rhonchi  CVS exam: normal rate and regular rhythm.  Musculoskeletal: No deformities; no active joint inflammation  Extremities: No edema, cyanosis, clubbing  Vessels: Symmetric bilaterally  Neurologic: Alert and oriented; speech intact; face symmetrical; moves all extremities well; CNII-XII intact without focal deficit   Assessment:  1. Left wrist pain   2. Type II diabetes mellitus with manifestations ([place])     Plan:  1. Suspect tendonitis; ? If area of concern is cyst vs bony prominence; will update X-ray today; continue brace, ice and add Mobic 15 mg qd; return with sports medicine- may need steroid injection.   2. Update labs today as requested; encouraged to schedule follow-up with her PCP- she plans to see him in April.   Return for CPE with [name] Dr. Tamala Julian left wrist pain.  Orders Placed This Encounter  Procedures  . DG Wrist Complete Left    Standing Status:   Future    Number of Occurrences:   1    Standing Expiration Date:   08/15/2019    Order Specific Question:   Reason for Exam (SYMPTOM  OR DIAGNOSIS REQUIRED)    Answer:   left wrist pain    Order Specific Question:   Is patient pregnant?    Answer:   No     Order Specific Question:   Preferred imaging location?    Answer:   [facility]    Order Specific Question:   Radiology Contrast Protocol - do NOT remove file path    Answer:   [email or web address]  . CBC w/Diff    Standing Status:   Future    Number of Occurrences:   1    Standing Expiration Date:   06/15/2019  . Comp Met (CMET)    Standing Status:   Future    Number of Occurrences:   1    Standing Expiration Date:   06/15/2019  . Lipid panel    Standing Status:   Future    Number of Occurrences:   1    Standing Expiration Date:   06/15/2019  . HgB A1c    Standing Status:   Future    Number of Occurrences:   1    Standing Expiration Date:   06/15/2019    Requested Prescriptions   Signed Prescriptions Disp Refills  . meloxicam (MOBIC) 15 MG tablet 30 tablet 0    Sig: Take 1 tablet (15 mg total) by mouth daily.

## 2018-06-19 ENCOUNTER — Other Ambulatory Visit: Payer: Self-pay | Admitting: Internal Medicine

## 2018-06-19 NOTE — Telephone Encounter (Signed)
Please advise on refill.

## 2018-06-19 NOTE — Telephone Encounter (Signed)
Patient appointment is scheduled for 07/22/18.  Patient is hoping that Adelina Mings can help her get her medication refilled today. Thank you so much!

## 2018-06-19 NOTE — Telephone Encounter (Signed)
LVM for patient to call back and make appt with [name], she is overdue for a CPE, please make CPE appt at patient convenience, once appt is made please let us know so we can send in medication.

## 2018-06-19 NOTE — Telephone Encounter (Signed)
Pt has not seen PCP since 07/2017.   Can you call her and have her come in. I can send in enough to get her to that appt if she schedules.   Pt has only seen Mickel Baas.

## 2018-06-19 NOTE — Telephone Encounter (Signed)
Patient called to check the status of her refill.  Patient stated that her CPE appointment was made for 07/22/2018, which it is.  Please advise and refill patient's medication

## 2018-06-25 ENCOUNTER — Other Ambulatory Visit: Payer: Self-pay | Admitting: Obstetrics and Gynecology

## 2018-06-25 DIAGNOSIS — Z1231 Encounter for screening mammogram for malignant neoplasm of breast: Secondary | ICD-10-CM

## 2018-06-29 ENCOUNTER — Other Ambulatory Visit: Payer: Self-pay

## 2018-06-29 MED ORDER — LABETALOL HCL 300 MG PO TABS: 300.0000 mg | ORAL_TABLET | Freq: Two times a day (BID) | ORAL | 0 refills | Status: DC

## 2018-07-01 ENCOUNTER — Encounter: Payer: Self-pay | Admitting: Family Medicine

## 2018-07-01 ENCOUNTER — Ambulatory Visit (INDEPENDENT_AMBULATORY_CARE_PROVIDER_SITE_OTHER): Payer: 59 | Admitting: Family Medicine

## 2018-07-01 ENCOUNTER — Ambulatory Visit: Payer: Self-pay

## 2018-07-01 ENCOUNTER — Other Ambulatory Visit: Payer: Self-pay

## 2018-07-01 VITALS — BP 128/80 | HR 100 | Ht 64.0 in | Wt 183.0 lb

## 2018-07-01 DIAGNOSIS — M25532 Pain in left wrist: Secondary | ICD-10-CM

## 2018-07-01 DIAGNOSIS — M654 Radial styloid tenosynovitis [de Quervain]: Secondary | ICD-10-CM | POA: Diagnosis not present

## 2018-07-01 MED ORDER — DICLOFENAC SODIUM 2 % TD SOLN: 2.0000 g | Freq: Two times a day (BID) | TRANSDERMAL | 3 refills | Status: DC

## 2018-07-01 NOTE — Assessment & Plan Note (Signed)
De Quervain's tenosynovitis.  Discussed bracing, home exercise, icing regimen, topical anti-inflammatories.  Discussed which activities to do which wants to avoid.  Patient will increase activity as tolerated.  Patient will follow-up again in 4 to 8 weeks

## 2018-07-01 NOTE — Patient Instructions (Signed)
Good to see you  Ice 20 minutes 2 times daily. Usually after activity and before bed. Wear the brace day and night for 2 weeks then nightly for 2 weeks.  pennsaid pinkie amount topically 2 times daily as needed.  See me again in 5-6 weeks and we will make sure all better
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
## 2018-07-02 ENCOUNTER — Other Ambulatory Visit: Payer: Self-pay

## 2018-07-02 MED ORDER — LABETALOL HCL 300 MG PO TABS: 300.0000 mg | ORAL_TABLET | Freq: Two times a day (BID) | ORAL | 0 refills | Status: DC

## 2018-07-07 ENCOUNTER — Other Ambulatory Visit: Payer: Self-pay | Admitting: Internal Medicine

## 2018-07-07 ENCOUNTER — Encounter: Payer: Self-pay | Admitting: Family Medicine

## 2018-07-07 DIAGNOSIS — Z794 Long term (current) use of insulin: Principal | ICD-10-CM

## 2018-07-07 DIAGNOSIS — E118 Type 2 diabetes mellitus with unspecified complications: Secondary | ICD-10-CM

## 2018-07-09 ENCOUNTER — Telehealth: Payer: Self-pay

## 2018-07-09 NOTE — Telephone Encounter (Signed)
PA for Linzess - Key: [id number]  PA for Victoza - Key: [id number]

## 2018-07-10 ENCOUNTER — Encounter: Payer: Self-pay | Admitting: Cardiovascular Disease

## 2018-07-14 ENCOUNTER — Telehealth: Payer: Self-pay

## 2018-07-14 NOTE — Telephone Encounter (Signed)
Sent instructions through [place].   Virtual Visit Pre-Appointment Phone Call  Steps For Call:  1. Confirm consent - "In the setting of the current Covid19 crisis, you are scheduled for a (phone or video) visit with your provider on (date) at (time).  Just as we do with many in-office visits, in order for you to participate in this visit, we must obtain consent.  If you'd like, I can send this to your mychart (if signed up) or email for you to review.  Otherwise, I can obtain your verbal consent now.  All virtual visits are billed to your insurance company just like a normal visit would be.  By agreeing to a virtual visit, we'd like you to understand that the technology does not allow for your provider to perform an examination, and thus may limit your provider's ability to fully assess your condition.  Finally, though the technology is pretty good, we cannot assure that it will always work on either your or our end, and in the setting of a video visit, we may have to convert it to a phone-only visit.  In either situation, we cannot ensure that we have a secure connection.  Are you willing to proceed?"  2. Give patient instructions for WebEx download to smartphone as below if video visit  3. Advise patient to be prepared with any vital sign or heart rhythm information, their current medicines, and a piece of paper and pen handy for any instructions they may receive the day of their visit  4. Inform patient they will receive a phone call 15 minutes prior to their appointment time (may be from unknown caller ID) so they should be prepared to answer  5. Confirm that appointment type is correct in Epic appointment notes (video vs telephone)    TELEPHONE CALL NOTE  MARYLN EASTHAM has been deemed a candidate for a follow-up tele-health visit to limit community exposure during the Covid-19 pandemic. I spoke with the patient via phone to ensure availability of phone/video source, confirm preferred  email & phone number, and discuss instructions and expectations.  I reminded TARIAH TRANSUE to be prepared with any vital sign and/or heart rhythm information that could potentially be obtained via home monitoring, at the time of her visit. I reminded HASINI PEACHEY to expect a phone call at the time of her visit if her visit.  Did the patient verbally acknowledge consent to treatment? Yes  Michaelyn Barter, RN 07/14/2018 3:37 PM   DOWNLOADING THE [place], go to [employer] and type in WebEx in the search bar. [place] [employer], the blue/green circle. The app is free but as with any other app downloads, their phone may require them to verify saved payment information or Apple password. The patient does NOT have to create an account.  - If Android, ask patient to go to [employer] and type in WebEx in the search bar. [place] [employer], the blue/green circle. The app is free but as with any other app downloads, their phone may require them to verify saved payment information or Android password. The patient does NOT have to create an account.   CONSENT FOR TELE-HEALTH VISIT - PLEASE REVIEW  I hereby voluntarily request, consent and authorize [facility] and its employed or contracted physicians, physician assistants, nurse practitioners or other licensed health care professionals (the Practitioner), to provide me with telemedicine health care services (the "Services") as deemed  necessary by the treating Practitioner. I acknowledge and consent to receive the Services by the Practitioner via telemedicine. I understand that the telemedicine visit will involve communicating with the Practitioner through live audiovisual communication technology and the disclosure of certain medical information by electronic transmission. I acknowledge that I have been given the opportunity to request an in-person assessment or other available alternative  prior to the telemedicine visit and am voluntarily participating in the telemedicine visit.  I understand that I have the right to withhold or withdraw my consent to the use of telemedicine in the course of my care at any time, without affecting my right to future care or treatment, and that the Practitioner or I may terminate the telemedicine visit at any time. I understand that I have the right to inspect all information obtained and/or recorded in the course of the telemedicine visit and may receive copies of available information for a reasonable fee.  I understand that some of the potential risks of receiving the Services via telemedicine include:  Marland Kitchen Delay or interruption in medical evaluation due to technological equipment failure or disruption; . Information transmitted may not be sufficient (e.g. poor resolution of images) to allow for appropriate medical decision making by the Practitioner; and/or  . In rare instances, security protocols could fail, causing a breach of personal health information.  Furthermore, I acknowledge that it is my responsibility to provide information about my medical history, conditions and care that is complete and accurate to the best of my ability. I acknowledge that Practitioner's advice, recommendations, and/or decision may be based on factors not within their control, such as incomplete or inaccurate data provided by me or distortions of diagnostic images or specimens that may result from electronic transmissions. I understand that the practice of medicine is not an exact science and that Practitioner makes no warranties or guarantees regarding treatment outcomes. I acknowledge that I will receive a copy of this consent concurrently upon execution via email to the email address I last provided but may also request a printed copy by calling the office of [facility].    I understand that my insurance will be billed for this visit.   I have read or had this  consent read to me. . I understand the contents of this consent, which adequately explains the benefits and risks of the Services being provided via telemedicine.  . I have been provided ample opportunity to ask questions regarding this consent and the Services and have had my questions answered to my satisfaction. . I give my informed consent for the services to be provided through the use of telemedicine in my medical care  By participating in this telemedicine visit I agree to the above.

## 2018-07-14 NOTE — Telephone Encounter (Signed)
Follow Up:    Pt said a virtual visit will be fine.

## 2018-07-14 NOTE — Telephone Encounter (Signed)
Left message for patient to call back. Need to change patient's office visit to virtual visit if patient is agreeable to this change.

## 2018-07-15 NOTE — Telephone Encounter (Signed)
Carolyn [name] with [employer] calling to follow up on PA for these medications. She states that the questionnaire has expired and will need to be renewed.   Carolyn [name] # [phone] Key- [id number]

## 2018-07-17 ENCOUNTER — Other Ambulatory Visit: Payer: Self-pay | Admitting: Internal Medicine

## 2018-07-17 DIAGNOSIS — E118 Type 2 diabetes mellitus with unspecified complications: Secondary | ICD-10-CM

## 2018-07-17 NOTE — Telephone Encounter (Signed)
Left patient vm to call back to schedule.  °

## 2018-07-20 ENCOUNTER — Telehealth: Payer: Self-pay | Admitting: Cardiovascular Disease

## 2018-07-20 ENCOUNTER — Other Ambulatory Visit: Payer: Self-pay | Admitting: Internal Medicine

## 2018-07-20 DIAGNOSIS — E118 Type 2 diabetes mellitus with unspecified complications: Secondary | ICD-10-CM

## 2018-07-20 MED ORDER — METFORMIN HCL ER 500 MG PO TB24: ORAL_TABLET | ORAL | 0 refills | Status: DC

## 2018-07-20 NOTE — Progress Notes (Signed)
Virtual Visit via Video Note   This visit type was conducted due to national recommendations for restrictions regarding the COVID-19 Pandemic (e.g. social distancing) in an effort to limit this patient's exposure and mitigate transmission in our community.  Due to her co-morbid illnesses, this patient is at least at moderate risk for complications without adequate follow up.  This format is felt to be most appropriate for this patient at this time.  All issues noted in this document were discussed and addressed.  A limited physical exam was performed with this format.  Please refer to the patient's chart for her consent to telehealth for [facility].   Evaluation Performed:  Follow-up visit  Date:  07/23/2018   ID:  Carolyn [name], DOB 26-Sep-1960, MRN [id number]  Patient Location: Home  Provider Location: Office  PCP:  [name], MD  Cardiologist:  No primary care provider on file. [place] Electrophysiologist:  None   Chief Complaint:  HTN  History of Present Illness:    Carolyn [name] is a 58 y.o. female who presents via audio/video conferencing for a telehealth visit today.    History of HTN, OSA, Anemia. Normal cath in 2014 Last echo 08/04/17 showed EF 50-55%  Trivial MR  . Started on labetalol for her BP last year  She has not had cardiac issues recently.  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    Past Medical History:  Diagnosis Date  . Allergy    seasonal  . Anemia    Anemia of chronic disease suspected   . Anxiety attack   . Arthritis   . Benign fundic gland polyps of stomach   . Dyslipidemia   . Exertional shortness of breath   . GERD (gastroesophageal reflux disease)   . Heart murmur   . Hyperlipidemia   . Hypertension   . Migraines    "probably weekly" (01/14/2013)  . Type II diabetes mellitus ([place])    Past Surgical History:  Procedure Laterality Date  . CHOLECYSTECTOMY  1990  . COLONOSCOPY    . ENDOMETRIAL ABLATION  ~ 2000-2002   "twice" (01/14/2013)  . LEFT HEART CATHETERIZATION WITH CORONARY ANGIOGRAM N/A 01/15/2013   Procedure: LEFT HEART CATHETERIZATION WITH CORONARY ANGIOGRAM;  Surgeon: Blane Ohara, MD;  Location[name] CATH LAB;  Service: Cardiovascular;  Laterality: N/A;  . LEFT OOPHORECTOMY Left ~ 2003  . ORIF TIBIA & FIBULA FRACTURES Left 2003  . VAGINAL HYSTERECTOMY  ~ 2003     Current Meds  Medication Sig  . BD PEN NEEDLE MICRO U/F 32G X 6 MM MISC USE 1 DAILY  . blood glucose meter kit and supplies KIT Dispense based on patient and insurance preference. Use up to four times daily as directed. (FOR ICD-9 250.00, 250.01).  . Blood Glucose Monitoring Suppl (ONETOUCH VERIO IQ SYSTEM) W/DEVICE KIT 1 Act by Does not apply route 3 (three) times daily.  . cloNIDine (CATAPRES) 0.1 MG tablet TAKE 2 TABLETS BY MOUTH ONCE DAILY  . EPINEPHrine 0.3 mg/0.3 mL  IJ SOAJ injection INJECT 0.3 MLS INTO THE MUSCLE ONCE FOR 1 DOSE  . etodolac (LODINE XL) 500 MG 24 hr tablet   . Fexofenadine HCl (ALLEGRA ALLERGY PO) Take by mouth.  Marland Kitchen glucose blood (ONE TOUCH ULTRA TEST) test strip Use to test blood sugar three times a week and prn if having symptoms of low blood sugar Dx: 250.92 90 day supply  . glucose blood (ONETOUCH VERIO) test strip Use TID  . hydrocortisone (ANUSOL-HC) 2.5 % rectal cream Place 1 application rectally 2 (two) times daily as needed for hemorrhoids.  Marland Kitchen labetalol (NORMODYNE) 300 MG tablet Take 1 tablet (300 mg total) by mouth 2 (two) times daily. Pt needs to keep  upcoming appt to receive further refills  . Lancets (ONETOUCH DELICA PLUS [id number]) MISC USE UP TO 4 TIMES DAILY AS DIRECTED  . linaclotide (LINZESS) 290 MCG CAPS capsule Take 1 capsule (290 mcg total) by mouth daily before breakfast.  . meloxicam (MOBIC) 15 MG tablet Take 1 tablet (15 mg total) by mouth daily.  . metFORMIN (GLUCOPHAGE-XR) 500 MG 24 hr tablet TAKE 1 TABLET BY MOUTH ONCE DAILY AFTER SUPPER  . omega-3 acid ethyl esters (LOVAZA) 1 g capsule Take 2 capsules (2 g total) by mouth 2 (two) times daily.  Marland Kitchen omeprazole (PRILOSEC) 40 MG capsule Take 40 mg by mouth 2 (two) times daily.  . simvastatin (ZOCOR) 40 MG tablet TAKE 1 TABLET BY MOUTH AT BEDTIME  . telmisartan (MICARDIS) 80 MG tablet TAKE 1 TABLET BY MOUTH ONCE DAILY  . triamterene-hydrochlorothiazide (DYAZIDE) 37.5-25 MG capsule Take 1 each (1 capsule total) by mouth daily.  Marland Kitchen VASCEPA 1 g CAPS TAKE 2 CAPSULES BY MOUTH TWICE DAILY  . VICTOZA 18 MG/3ML SOPN INJECT 1.8MG INTO THE SKIN ONCE DAILY     Allergies:   Invokana [canagliflozin]; Codeine; Flexeril [cyclobenzaprine]; Morphine and related; Reglan [metoclopramide]; and Silicon   Social History   Tobacco Use  . Smoking status: Never Smoker  . Smokeless tobacco: Never Used  Substance Use Topics  . Alcohol use: No    Alcohol/week: 0.0 standard drinks    Comment: socially  . Drug use: No     Family Hx: The patient's family history includes Alzheimer's disease in her mother; Cancer in her sister; Diabetes in her brother, sister, and sister; Emphysema in her father and sister; Hypertension in her mother; Thyroid cancer in her sister. There is no history of Colon cancer.  ROS:   Please see the history of present illness.     All other systems reviewed and are negative.   Prior CV studies:   The following studies were reviewed today:  Echo 08/04/17 Study Conclusions  - Left ventricle: The cavity size was normal. Wall thickness was   increased in a pattern of  moderate LVH. Systolic function was   normal. The estimated ejection fraction was in the range of 50%   to 55%. Images were inadequate for LV wall motion assessment.   Doppler parameters are consistent with abnormal left ventricular   relaxation (grade 1 diastolic dysfunction). The E/e&' ratio is   between 8-15, suggesting indeterminate LV filling pressure. - Aortic valve: Trileaflet. Sclerosis without stenosis. There was   no regurgitation. - Mitral valve: Calcified annulus. Mildly thickened leaflets .   There was trivial regurgitation. - Left atrium: The atrium was normal in size. - Tricuspid valve: There was trivial regurgitation. - Pulmonary arteries: PA peak pressure: 16 mm Hg (S). - Inferior vena cava: The vessel was normal  in size. The   respirophasic diameter changes were in the normal range (= 50%),   consistent with normal central venous pressure.  Impressions:  - Compared to a prior study in 2017, the LVEF is lower at 50-55%.  Labs/Other Tests and Data Reviewed:    EKG:  SR rate 88 nonspecific ST changes   Recent Labs: 06/15/2018: ALT 24; BUN 20; Creatinine, Ser 1.01; Hemoglobin 10.5; Platelets 317.0; Potassium 4.0; Sodium 137   Recent Lipid Panel Lab Results  Component Value Date/Time   CHOL 161 06/15/2018 11:50 AM   TRIG 298.0 (H) 06/15/2018 11:50 AM   HDL 34.40 (L) 06/15/2018 11:50 AM   CHOLHDL 5 06/15/2018 11:50 AM   LDLCALC 68 07/30/2013 11:22 AM   LDLDIRECT 81.0 06/15/2018 11:50 AM    Wt Readings from Last 3 Encounters:  07/01/18 83 kg  06/15/18 82.6 kg  03/20/18 82.6 kg     Objective:    Vital Signs:  There were no vitals taken for this visit.   Overweight black female Skin warm and dry No JVP elevation  No edema   ASSESSMENT & PLAN:    1. HTN:  Well controlled.  Continue current medications and low sodium Dash type diet.   2. Abnormal ECG:  With echo 07/2017 showing low normal EF consider f/u echo if symptoms develop or in a year  3. PT/OT:   Discussed splinting and exercises for left wrist pain f/u Hulan Saas DO 4.  DM:  Discussed low carb diet.  Target hemoglobin A1c is 6.5 or less.  Continue current medications. 5. HLD:  On statin and vascepa f/u Dr Ronnald Ramp LDL at goal 81 mg/dL  COVID-19 Education: The signs and symptoms of COVID-19 were discussed with the patient and how to seek care for testing (follow up with PCP or arrange E-visit).  The importance of social distancing was discussed today.  Time:   Today, I have spent 30 minutes with the patient with telehealth technology discussing the above problems.     Medication Adjustments/Labs and Tests Ordered: Current medicines are reviewed at length with the patient today.  Concerns regarding medicines are outlined above.  Tests Ordered: No orders of the defined types were placed in this encounter.  Medication Changes: No orders of the defined types were placed in this encounter.   Disposition:  Follow up in a year   Signed, Jenkins Rouge, MD  07/23/2018 8:51 AM    [place]

## 2018-07-20 NOTE — Telephone Encounter (Signed)
New message     Webex app downloaded for 07-23-18 ov.

## 2018-07-21 ENCOUNTER — Ambulatory Visit: Payer: 59

## 2018-07-22 ENCOUNTER — Encounter: Payer: 59 | Admitting: Internal Medicine

## 2018-07-23 ENCOUNTER — Other Ambulatory Visit: Payer: Self-pay

## 2018-07-23 ENCOUNTER — Telehealth (INDEPENDENT_AMBULATORY_CARE_PROVIDER_SITE_OTHER): Payer: 59 | Admitting: Cardiovascular Disease

## 2018-07-23 VITALS — BP 147/98 | HR 100 | Ht 64.0 in | Wt 180.0 lb

## 2018-07-23 DIAGNOSIS — I10 Essential (primary) hypertension: Secondary | ICD-10-CM

## 2018-07-23 DIAGNOSIS — I420 Dilated cardiomyopathy: Secondary | ICD-10-CM | POA: Diagnosis not present

## 2018-07-23 NOTE — Patient Instructions (Signed)
Medication Instructions:   If you need a refill on your cardiac medications before your next appointment, please call your pharmacy.   Lab work:  If you have labs (blood work) drawn today and your tests are completely normal, you will receive your results only by: Marland Kitchen MyChart Message (if you have MyChart) OR . A paper copy in the mail If you have any lab test that is abnormal or we need to change your treatment, we will call you to review the results.  Testing/Procedures: Your physician has requested that you have an echocardiogram in one year. Echocardiography is a painless test that uses sound waves to create images of your heart. It provides your doctor with information about the size and shape of your heart and how well your heart's chambers and valves are working. This procedure takes approximately one hour. There are no restrictions for this procedure.  Follow-Up: At [facility], you and your health needs are our priority.  As part of our continuing mission to provide you with exceptional heart care, we have created designated Provider Care Teams.  These Care Teams include your primary Cardiologist (physician) and Advanced Practice Providers (APPs -  Physician Assistants and Nurse Practitioners) who all work together to provide you with the care you need, when you need it. You will need a follow up appointment in 12 months.  Please call our office 2 months in advance to schedule this appointment.  You may see Dr. Johnsie Cancel or one of the following Advanced Practice Providers on your designated Care Team:   Truitt Merle, NP Cecilie Kicks, NP . Kathyrn Drown, NP

## 2018-07-27 ENCOUNTER — Encounter: Payer: Self-pay | Admitting: Internal Medicine

## 2018-07-28 ENCOUNTER — Encounter: Payer: 59 | Admitting: Internal Medicine
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
## 2018-08-06 ENCOUNTER — Other Ambulatory Visit: Payer: Self-pay

## 2018-08-06 ENCOUNTER — Encounter: Payer: Self-pay | Admitting: Family Medicine

## 2018-08-06 ENCOUNTER — Ambulatory Visit (INDEPENDENT_AMBULATORY_CARE_PROVIDER_SITE_OTHER): Payer: 59 | Admitting: Family Medicine

## 2018-08-06 DIAGNOSIS — M654 Radial styloid tenosynovitis [de Quervain]: Secondary | ICD-10-CM

## 2018-08-06 NOTE — Patient Instructions (Addendum)
Good to see you  Ice 20 minutes 2 times daily. Usually after activity and before bed. Continue the brace at night pennsaid pinkie amount topically 2 times daily as needed.  See me again in 4 weeks and if not better then we will need to consider injection  Stay safe

## 2018-08-06 NOTE — Assessment & Plan Note (Signed)
Patient still has a positive Finkelstein but improved range of motion.  Patient does have a cyst over the area.  And this seems to be more of a inclusion cyst.  Discussed aspiration which patient declined.  I do not see any abnormal vascularity on ultrasound.  Continue with conservative therapy.  Follow-up again 4 weeks

## 2018-08-17 ENCOUNTER — Other Ambulatory Visit: Payer: Self-pay | Admitting: Internal Medicine

## 2018-08-19 ENCOUNTER — Other Ambulatory Visit: Payer: Self-pay | Admitting: Internal Medicine

## 2018-08-19 DIAGNOSIS — Z794 Long term (current) use of insulin: Principal | ICD-10-CM

## 2018-08-19 DIAGNOSIS — E118 Type 2 diabetes mellitus with unspecified complications: Secondary | ICD-10-CM

## 2018-08-27 LAB — HM DIABETES EYE EXAM

## 2018-08-30 ENCOUNTER — Other Ambulatory Visit: Payer: Self-pay | Admitting: Internal Medicine

## 2018-08-30 DIAGNOSIS — E118 Type 2 diabetes mellitus with unspecified complications: Secondary | ICD-10-CM

## 2018-08-30 DIAGNOSIS — I10 Essential (primary) hypertension: Secondary | ICD-10-CM

## 2018-09-01 LAB — HM PAP SMEAR

## 2018-09-02 ENCOUNTER — Other Ambulatory Visit: Payer: Self-pay | Admitting: Internal Medicine

## 2018-09-02 ENCOUNTER — Ambulatory Visit: Payer: 59

## 2018-09-02 ENCOUNTER — Other Ambulatory Visit: Payer: Self-pay

## 2018-09-02 ENCOUNTER — Encounter: Payer: Self-pay | Admitting: Family Medicine

## 2018-09-02 ENCOUNTER — Ambulatory Visit: Payer: Self-pay

## 2018-09-02 ENCOUNTER — Ambulatory Visit (INDEPENDENT_AMBULATORY_CARE_PROVIDER_SITE_OTHER): Payer: 59 | Admitting: Family Medicine

## 2018-09-02 VITALS — BP 140/86 | HR 103 | Ht 64.0 in | Wt 187.0 lb

## 2018-09-02 DIAGNOSIS — E785 Hyperlipidemia, unspecified: Secondary | ICD-10-CM

## 2018-09-02 DIAGNOSIS — M25532 Pain in left wrist: Secondary | ICD-10-CM | POA: Diagnosis not present

## 2018-09-02 DIAGNOSIS — M654 Radial styloid tenosynovitis [de Quervain]: Secondary | ICD-10-CM

## 2018-09-02 MED ORDER — SIMVASTATIN 40 MG PO TABS: 40.0000 mg | ORAL_TABLET | Freq: Every day | ORAL | 1 refills | Status: DC
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
## 2018-09-02 NOTE — Assessment & Plan Note (Signed)
Patient is doing much better at this time.  Ultrasound shows significant decrease in the neovascularization in the hypoechoic changes.  Patient is also on symptoms on physical exam seems to be improving.  Continue conservative therapy at this point including her home exercises, icing, topical anti-inflammatories.  Patient will follow-up with me again in 2 months

## 2018-09-02 NOTE — Patient Instructions (Signed)
Great to see you  Carolyn [name] is your friend.  Keep it up  Maybe pennsaid only 1 time a day  Keep the brace at night for another month  See me again in 7-8 weeks

## 2018-09-29 ENCOUNTER — Other Ambulatory Visit: Payer: Self-pay | Admitting: Internal Medicine

## 2018-09-29 DIAGNOSIS — E118 Type 2 diabetes mellitus with unspecified complications: Secondary | ICD-10-CM

## 2018-09-29 LAB — HM MAMMOGRAPHY: HM Mammogram: NORMAL (ref 0–4)

## 2018-09-30 ENCOUNTER — Other Ambulatory Visit (INDEPENDENT_AMBULATORY_CARE_PROVIDER_SITE_OTHER): Payer: 59

## 2018-09-30 ENCOUNTER — Ambulatory Visit (INDEPENDENT_AMBULATORY_CARE_PROVIDER_SITE_OTHER): Payer: 59 | Admitting: Internal Medicine

## 2018-09-30 ENCOUNTER — Encounter: Payer: Self-pay | Admitting: Internal Medicine

## 2018-09-30 ENCOUNTER — Other Ambulatory Visit: Payer: Self-pay

## 2018-09-30 VITALS — BP 120/76 | HR 93 | Temp 98.9°F | Resp 16 | Ht 64.0 in | Wt 187.0 lb

## 2018-09-30 DIAGNOSIS — Z0000 Encounter for general adult medical examination without abnormal findings: Secondary | ICD-10-CM

## 2018-09-30 DIAGNOSIS — E781 Pure hyperglyceridemia: Secondary | ICD-10-CM | POA: Diagnosis not present

## 2018-09-30 DIAGNOSIS — E118 Type 2 diabetes mellitus with unspecified complications: Secondary | ICD-10-CM

## 2018-09-30 DIAGNOSIS — D638 Anemia in other chronic diseases classified elsewhere: Secondary | ICD-10-CM

## 2018-09-30 DIAGNOSIS — E519 Thiamine deficiency, unspecified: Secondary | ICD-10-CM

## 2018-09-30 DIAGNOSIS — D538 Other specified nutritional anemias: Secondary | ICD-10-CM

## 2018-09-30 DIAGNOSIS — I10 Essential (primary) hypertension: Secondary | ICD-10-CM

## 2018-09-30 DIAGNOSIS — D649 Anemia, unspecified: Secondary | ICD-10-CM | POA: Diagnosis not present

## 2018-09-30 LAB — CBC WITH DIFFERENTIAL/PLATELET
Basophils Absolute: 0.1 10*3/uL (ref 0.0–0.1)
Basophils Relative: 1 % (ref 0.0–3.0)
Eosinophils Absolute: 0.2 10*3/uL (ref 0.0–0.7)
Eosinophils Relative: 2 % (ref 0.0–5.0)
HCT: 30.8 % — ABNORMAL LOW (ref 36.0–46.0)
Hemoglobin: 10.4 g/dL — ABNORMAL LOW (ref 12.0–15.0)
Lymphocytes Relative: 29.3 % (ref 12.0–46.0)
Lymphs Abs: 2.4 10*3/uL (ref 0.7–4.0)
MCHC: 33.7 g/dL (ref 30.0–36.0)
MCV: 86.6 fl (ref 78.0–100.0)
Monocytes Absolute: 0.5 10*3/uL (ref 0.1–1.0)
Monocytes Relative: 6.7 % (ref 3.0–12.0)
Neutro Abs: 5 10*3/uL (ref 1.4–7.7)
Neutrophils Relative %: 61 % (ref 43.0–77.0)
Platelets: 312 10*3/uL (ref 150.0–400.0)
RBC: 3.55 Mil/uL — ABNORMAL LOW (ref 3.87–5.11)
RDW: 13.6 % (ref 11.5–15.5)
WBC: 8.2 10*3/uL (ref 4.0–10.5)

## 2018-09-30 LAB — URINALYSIS, ROUTINE W REFLEX MICROSCOPIC
Bilirubin Urine: NEGATIVE
Hgb urine dipstick: NEGATIVE
Ketones, ur: NEGATIVE
Leukocytes,Ua: NEGATIVE
Nitrite: NEGATIVE
RBC / HPF: NONE SEEN (ref 0–?)
Specific Gravity, Urine: 1.02 (ref 1.000–1.030)
Urine Glucose: NEGATIVE
Urobilinogen, UA: 0.2 (ref 0.0–1.0)
pH: 6 (ref 5.0–8.0)

## 2018-09-30 LAB — IBC PANEL
Iron: 54 ug/dL (ref 42–145)
Saturation Ratios: 16.2 % — ABNORMAL LOW (ref 20.0–50.0)
Transferrin: 238 mg/dL (ref 212.0–360.0)

## 2018-09-30 LAB — FERRITIN: Ferritin: 120.8 ng/mL (ref 10.0–291.0)

## 2018-09-30 LAB — BASIC METABOLIC PANEL
BUN: 18 mg/dL (ref 6–23)
CO2: 25 mEq/L (ref 19–32)
Calcium: 10 mg/dL (ref 8.4–10.5)
Chloride: 102 mEq/L (ref 96–112)
Creatinine, Ser: 0.87 mg/dL (ref 0.40–1.20)
GFR: 81.31 mL/min (ref >60.00)
Glucose, Bld: 129 mg/dL — ABNORMAL HIGH (ref 70–99)
Potassium: 3.9 mEq/L (ref 3.5–5.1)
Sodium: 137 mEq/L (ref 135–145)

## 2018-09-30 LAB — FOLATE: Folate: >23.9 ng/mL (ref >5.9)

## 2018-09-30 LAB — VITAMIN B12: Vitamin B-12: 1206 pg/mL — ABNORMAL HIGH (ref 211–911)

## 2018-09-30 LAB — MICROALBUMIN / CREATININE URINE RATIO
Creatinine,U: 90.2 mg/dL
Microalb Creat Ratio: 19.9 mg/g (ref 0.0–30.0)
Microalb, Ur: 18 mg/dL — ABNORMAL HIGH (ref 0.0–1.9)

## 2018-09-30 LAB — HEMOGLOBIN A1C: Hgb A1c MFr Bld: 6.8 % — ABNORMAL HIGH (ref 4.6–6.5)

## 2018-09-30 LAB — TRIGLYCERIDES: Triglycerides: 449 mg/dL — ABNORMAL HIGH (ref 0.0–149.0)

## 2018-09-30 MED ORDER — BYDUREON 2 MG ~~LOC~~ PEN: 1.0000 | PEN_INJECTOR | SUBCUTANEOUS | 1 refills | Status: DC

## 2018-09-30 MED ORDER — OMEGA-3-ACID ETHYL ESTERS 1 G PO CAPS: 2.0000 g | ORAL_CAPSULE | Freq: Two times a day (BID) | ORAL | 1 refills | Status: DC

## 2018-09-30 NOTE — Progress Notes (Signed)
Subjective:  Patient ID: LANDEN BREELAND, female    DOB: 06-17-1960  Age: 58 y.o. MRN: [id number]  CC: Anemia, Diabetes, Annual Exam, and Hyperlipidemia   HPI TYRIKA NEWMAN presents for a CPX.  She complains that victiza is too expensive.  She does not monitor her blood sugar.  She denies polys.  Outpatient Medications Prior to Visit  Medication Sig Dispense Refill  . BD PEN NEEDLE MICRO U/F 32G X 6 MM MISC USE 1 DAILY 100 each 3  . blood glucose meter kit and supplies KIT Dispense based on patient and insurance preference. Use up to four times daily as directed. (FOR ICD-9 250.00, 250.01). 1 each 0  . Blood Glucose Monitoring Suppl (ONETOUCH VERIO IQ SYSTEM) W/DEVICE KIT 1 Act by Does not apply route 3 (three) times daily. 2 kit 0  . cloNIDine (CATAPRES) 0.1 MG tablet Take 2 tablets by mouth once daily 60 tablet 3  . EPINEPHrine 0.3 mg/0.3 mL IJ SOAJ injection INJECT 0.3 MLS INTO THE MUSCLE ONCE FOR 1 DOSE  0  . etodolac (LODINE XL) 500 MG 24 hr tablet   0  . Fexofenadine HCl (ALLEGRA ALLERGY PO) Take by mouth.    Marland Kitchen glucose blood (ONE TOUCH ULTRA TEST) test strip Use to test blood sugar three times a week and prn if having symptoms of low blood sugar Dx: 250.92 90 day supply 100 each 11  . glucose blood (ONETOUCH VERIO) test strip Use TID 100 each 12  . hydrocortisone (ANUSOL-HC) 2.5 % rectal cream Place 1 application rectally 2 (two) times daily as needed for hemorrhoids. 30 g 1  . labetalol (NORMODYNE) 300 MG tablet Take 1 tablet (300 mg total) by mouth 2 (two) times daily. Pt needs to keep upcoming appt to receive further refills 180 tablet 0  . Lancets (ONETOUCH DELICA PLUS [id number]) MISC USE UP TO 4 TIMES DAILY AS DIRECTED  1  . linaclotide (LINZESS) 290 MCG CAPS capsule Take 1 capsule (290 mcg total) by mouth daily before breakfast. 90 capsule 1  . metFORMIN (GLUCOPHAGE-XR) 500 MG 24 hr tablet TAKE 1 TABLET BY MOUTH ONCE DAILY AFTER SUPPER 90 tablet 0  . omeprazole (PRILOSEC)  40 MG capsule Take 40 mg by mouth 2 (two) times daily.    . simvastatin (ZOCOR) 40 MG tablet Take 1 tablet (40 mg total) by mouth at bedtime. 90 tablet 1  . telmisartan (MICARDIS) 80 MG tablet Take 1 tablet by mouth once daily 90 tablet 0  . triamterene-hydrochlorothiazide (DYAZIDE) 37.5-25 MG capsule TAKE 1 CAPSULE BY MOUTH DAILY 90 capsule 0  . VASCEPA 1 g CAPS TAKE 2 CAPSULES BY MOUTH TWICE DAILY 360 capsule 1  . meloxicam (MOBIC) 15 MG tablet Take 1 tablet (15 mg total) by mouth daily. 30 tablet 0  . omega-3 acid ethyl esters (LOVAZA) 1 g capsule Take 2 capsules (2 g total) by mouth 2 (two) times daily. 360 capsule 1  . VICTOZA 18 MG/3ML SOPN INJECT 1.8MG INTO THE SKIN ONCE DAILY 9 mL 0   No facility-administered medications prior to visit.     ROS Review of Systems  Constitutional: Negative for diaphoresis and fatigue.  HENT: Negative.   Eyes: Negative for visual disturbance.  Respiratory: Negative for cough, chest tightness and shortness of breath.   Cardiovascular: Negative for chest pain, palpitations and leg swelling.  Gastrointestinal: Negative for abdominal pain, blood in stool, constipation, diarrhea, nausea and vomiting.  Endocrine: Negative for polydipsia, polyphagia and polyuria.  Genitourinary: Negative.  Negative for difficulty urinating and hematuria.  Musculoskeletal: Negative.  Negative for arthralgias and myalgias.  Skin: Negative.  Negative for color change, pallor and rash.  Neurological: Negative.  Negative for dizziness, weakness, light-headedness and headaches.  Hematological: Negative for adenopathy. Does not bruise/bleed easily.  Psychiatric/Behavioral: Negative.     Objective:  BP 120/76 (BP Location: Left Arm, Patient Position: Sitting, Cuff Size: Large)   Pulse 93   Temp 98.9 F (37.2 C) (Oral)   Resp 16   Ht '5\' 4"'  (1.626 m)   Wt 187 lb (84.8 kg)   SpO2 96%   BMI 32.10 kg/m   BP Readings from Last 3 Encounters:  09/30/18 120/76  09/02/18  140/86  08/06/18 (!) 146/90    Wt Readings from Last 3 Encounters:  09/30/18 187 lb (84.8 kg)  09/02/18 187 lb (84.8 kg)  08/06/18 187 lb (84.8 kg)    Physical Exam Vitals signs reviewed.  Constitutional:      Appearance: She is obese. She is not ill-appearing or diaphoretic.  HENT:     Nose: Nose normal.     Mouth/Throat:     Mouth: Mucous membranes are moist.     Pharynx: No posterior oropharyngeal erythema.  Eyes:     General: No scleral icterus.    Conjunctiva/sclera: Conjunctivae normal.  Neck:     Musculoskeletal: Normal range of motion. No neck rigidity.  Cardiovascular:     Rate and Rhythm: Normal rate and regular rhythm.     Heart sounds: Murmur present. Systolic murmur present with a grade of 1/6. No diastolic murmur. No gallop. No S3 or S4 sounds.   Pulmonary:     Breath sounds: No stridor. No wheezing, rhonchi or rales.  Abdominal:     General: Abdomen is protuberant. Bowel sounds are normal.     Palpations: There is no hepatomegaly or splenomegaly.     Tenderness: There is no abdominal tenderness.     Hernia: No hernia is present.  Musculoskeletal: Normal range of motion.     Right lower leg: No edema.     Left lower leg: No edema.  Lymphadenopathy:     Cervical: No cervical adenopathy.  Skin:    General: Skin is warm and dry.  Neurological:     General: No focal deficit present.     Mental Status: She is alert.  Psychiatric:        Mood and Affect: Mood normal.        Behavior: Behavior normal.     Lab Results  Component Value Date   WBC 8.2 09/30/2018   HGB 10.4 (L) 09/30/2018   HCT 30.8 (L) 09/30/2018   PLT 312.0 09/30/2018   GLUCOSE 129 (H) 09/30/2018   CHOL 161 06/15/2018   TRIG 449.0 (H) 09/30/2018   HDL 34.40 (L) 06/15/2018   LDLDIRECT 81.0 06/15/2018   LDLCALC 68 07/30/2013   ALT 24 06/15/2018   AST 14 06/15/2018   NA 137 09/30/2018   K 3.9 09/30/2018   CL 102 09/30/2018   CREATININE 0.87 09/30/2018   BUN 18 09/30/2018   CO2 25  09/30/2018   TSH 1.989 07/10/2017   INR 1.04 01/14/2013   HGBA1C 6.8 (H) 09/30/2018   MICROALBUR 18.0 (H) 09/30/2018    Dg Wrist Complete Left  Result Date: 06/15/2018 CLINICAL DATA:  Lateral left wrist pain and swelling 1 month. No injury. EXAM: LEFT WRIST - COMPLETE 3+ VIEW COMPARISON:  None. FINDINGS: Minimal degenerate change over the first carpometacarpal joint. No  evidence of acute fracture or dislocation. No evidence of bony erosions. IMPRESSION: No acute findings. Mild degenerative change over the first carpometacarpal joint. Electronically Signed   By: Marin Olp M.D.   On: 06/15/2018 16:08    Assessment & Plan:   Dymond was seen today for anemia, diabetes, annual exam and hyperlipidemia.  Diagnoses and all orders for this visit:  Type II diabetes mellitus with manifestations ([place])- Her A1c is at 6.8%.  Her blood sugars are adequately well controlled.  Will continue the combination of metformin and a GLP-1 agonist. -     Basic metabolic panel; Future -     Hemoglobin A1c; Future -     Urinalysis, Routine w reflex microscopic; Future -     Microalbumin / creatinine urine ratio; Future -     HM Diabetes Foot Exam -     Exenatide ER (BYDUREON) 2 MG PEN; Inject 1 Act into the skin once a week.  Hypertriglyceridemia- Her triglycerides are elevated at 449 and she is not compliant with Lovaza.  I have asked her to restart Lovaza and to continue to work on her lifestyle modifications. -     Triglycerides; Future -     omega-3 acid ethyl esters (LOVAZA) 1 g capsule; Take 2 capsules (2 g total) by mouth 2 (two) times daily.  Anemia due to acquired thiamine deficiency- I will monitor her thiamine level. -     CBC with Differential/Platelet; Future -     Vitamin B1; Future  Anemia in other chronic diseases classified elsewhere- Her H&H are unchanged over the last few years.  Her reticulocyte count is normal and her LDH is low.  This has been thoroughly evaluated by hematology.  I will  monitor her for vitamin deficiencies. This is c/w the anemia of chronic disease. -     CBC with Differential/Platelet; Future -     IBC panel; Future -     Vitamin B12; Future -     Folate; Future -     Ferritin; Future -     Vitamin B1; Future -     Reticulocytes; Future -     Lactate dehydrogenase; Future  Normocytic anemia -     CBC with Differential/Platelet; Future -     IBC panel; Future -     Vitamin B12; Future -     Folate; Future -     Ferritin; Future -     Vitamin B1; Future -     Reticulocytes; Future -     Lactate dehydrogenase; Future  Essential hypertension- Her blood pressure is adequately well controlled. -     Basic metabolic panel; Future -     Urinalysis, Routine w reflex microscopic; Future  Routine general medical examination at a health care facility- Exam completed, labs reviewed, she refused a pneumovax today, screening for colon cancer is up-to-date, her Pap and mammogram are up-to-date, patient education material was given.   I have discontinued Soledad H. Pettry's omega-3 acid ethyl esters, meloxicam, and Victoza. I am also having her start on Bydureon and omega-3 acid ethyl esters. Additionally, I am having her maintain her glucose blood, glucose blood, OneTouch Verio IQ System, etodolac, blood glucose meter kit and supplies, EPINEPHrine, Vascepa, OneTouch Delica Plus [id number], linaclotide, BD Pen Needle Micro U/F, hydrocortisone, labetalol, metFORMIN, Fexofenadine HCl (ALLEGRA ALLERGY PO), omeprazole, cloNIDine, triamterene-hydrochlorothiazide, telmisartan, and simvastatin.  Meds ordered this encounter  Medications  . Exenatide ER (BYDUREON) 2 MG PEN    Sig: Inject  1 Act into the skin once a week.    Dispense:  12 each    Refill:  1  . omega-3 acid ethyl esters (LOVAZA) 1 g capsule    Sig: Take 2 capsules (2 g total) by mouth 2 (two) times daily.    Dispense:  360 capsule    Refill:  1     Follow-up: Return in about 6 months (around  04/01/2019).  Scarlette Calico, MD

## 2018-09-30 NOTE — Patient Instructions (Signed)

## 2018-10-01 ENCOUNTER — Encounter: Payer: Self-pay | Admitting: Internal Medicine

## 2018-10-02 ENCOUNTER — Other Ambulatory Visit: Payer: Self-pay | Admitting: Internal Medicine

## 2018-10-02 ENCOUNTER — Encounter: Payer: Self-pay | Admitting: Internal Medicine

## 2018-10-02 DIAGNOSIS — E118 Type 2 diabetes mellitus with unspecified complications: Secondary | ICD-10-CM

## 2018-10-03 ENCOUNTER — Other Ambulatory Visit: Payer: Self-pay

## 2018-10-03 ENCOUNTER — Ambulatory Visit
Admission: RE | Admit: 2018-10-03 | Discharge: 2018-10-03 | Disposition: A | Discharge status: Home / Self Care | Payer: 59 | Source: Ambulatory Visit | Attending: Obstetrics and Gynecology | Admitting: Obstetrics and Gynecology

## 2018-10-03 DIAGNOSIS — Z1231 Encounter for screening mammogram for malignant neoplasm of breast: Secondary | ICD-10-CM

## 2018-10-04 LAB — RETICULOCYTES
ABS Retic: 62100 cells/uL (ref 20000–8000)
Retic Ct Pct: 1.8 %

## 2018-10-04 LAB — VITAMIN B1: Vitamin B1 (Thiamine): 30 nmol/L (ref 8–30)

## 2018-10-04 LAB — LACTATE DEHYDROGENASE: LDH: 105 U/L — ABNORMAL LOW (ref 120–250)

## 2018-10-05 LAB — HM MAMMOGRAPHY

## 2018-10-21 ENCOUNTER — Ambulatory Visit (INDEPENDENT_AMBULATORY_CARE_PROVIDER_SITE_OTHER): Payer: 59 | Admitting: Family Medicine

## 2018-10-21 ENCOUNTER — Ambulatory Visit: Payer: Self-pay

## 2018-10-21 ENCOUNTER — Other Ambulatory Visit: Payer: Self-pay

## 2018-10-21 ENCOUNTER — Encounter: Payer: Self-pay | Admitting: Family Medicine

## 2018-10-21 VITALS — BP 154/90 | HR 92 | Ht 64.0 in | Wt 188.0 lb

## 2018-10-21 DIAGNOSIS — M25532 Pain in left wrist: Secondary | ICD-10-CM | POA: Diagnosis not present

## 2018-10-21 DIAGNOSIS — M654 Radial styloid tenosynovitis [de Quervain]: Secondary | ICD-10-CM | POA: Diagnosis not present

## 2018-10-21 NOTE — Patient Instructions (Signed)
Good to see you  Cyst injected today  Take it easy for a day or so  Wear brace maybe at night for a week  Ice 20 minutes 2 times daily. Usually after activity and before bed. See me again in 4 weeks

## 2018-10-21 NOTE — Assessment & Plan Note (Signed)
Patient given injection and tolerated the procedure well.  We discussed icing regimen and home exercise.  Discussed which activities of doing which wants to avoid.  Patient is to increase activity slowly.  I am hoping that this will be beneficial.  Patient does have what appears to be a small ganglion cyst in the area.  Patient continues to have pain and fails this we may need to consider advanced imaging to further evaluate the small cyst.
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
## 2018-10-24 ENCOUNTER — Other Ambulatory Visit: Payer: Self-pay | Admitting: Family Medicine

## 2018-10-26 ENCOUNTER — Other Ambulatory Visit: Payer: Self-pay

## 2018-10-26 MED ORDER — DICLOFENAC SODIUM 2 % TD SOLN: 2.0000 g | Freq: Two times a day (BID) | TRANSDERMAL | 3 refills | Status: DC

## 2018-10-28 ENCOUNTER — Encounter: Payer: Self-pay | Admitting: Family Medicine

## 2018-11-02 ENCOUNTER — Telehealth: Payer: Self-pay | Admitting: Internal Medicine

## 2018-11-02 NOTE — Telephone Encounter (Signed)
I received renewal FMLA forms for patient.   Forms have been completed & placed in providers box to review and sign if approves.   LOV: 09/30/18

## 2018-11-04 DIAGNOSIS — Z0279 Encounter for issue of other medical certificate: Secondary | ICD-10-CM

## 2018-11-04 NOTE — Telephone Encounter (Signed)
Forms have been signed, Faxed to [facility] [phone], Copy sent to scan. &Charged for.   Original has been mailed to patient, LVM to informed.

## 2018-11-10 NOTE — Assessment & Plan Note (Signed)
Normocytic anemia hemoglobin 10.5 on 10/27/2017 Felt to be related to anemia of chronic disease, possibly related to diabetes and hypertension Extensive work-up did not reveal any problems with iron, Q19, folic acid and micronutrients  Bone marrow biopsy 10/27/2017: Trilineage hematopoiesis, no dyspoiesis.  Cytogenetics were normal  Plan: Watchful monitoring every 6 months with labs and follow-up If the hemoglobin drops below 10 then we will consider giving erythropoietin stimulating agents.
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
## 2018-11-16 ENCOUNTER — Encounter: Payer: Self-pay | Admitting: Family Medicine

## 2018-11-16 ENCOUNTER — Other Ambulatory Visit: Payer: Self-pay | Admitting: Cardiovascular Disease

## 2018-11-16 ENCOUNTER — Ambulatory Visit: Payer: 59 | Admitting: Family Medicine

## 2018-11-16 ENCOUNTER — Other Ambulatory Visit: Payer: Self-pay

## 2018-11-16 ENCOUNTER — Ambulatory Visit: Payer: Self-pay

## 2018-11-16 ENCOUNTER — Ambulatory Visit (INDEPENDENT_AMBULATORY_CARE_PROVIDER_SITE_OTHER): Payer: 59 | Admitting: Family Medicine

## 2018-11-16 VITALS — BP 142/82 | HR 77 | Ht 64.0 in | Wt 188.0 lb

## 2018-11-16 DIAGNOSIS — D649 Anemia, unspecified: Secondary | ICD-10-CM

## 2018-11-16 DIAGNOSIS — M654 Radial styloid tenosynovitis [de Quervain]: Secondary | ICD-10-CM | POA: Diagnosis not present

## 2018-11-16 DIAGNOSIS — M25559 Pain in unspecified hip: Secondary | ICD-10-CM

## 2018-11-16 DIAGNOSIS — M5416 Radiculopathy, lumbar region: Secondary | ICD-10-CM

## 2018-11-16 MED ORDER — GABAPENTIN 100 MG PO CAPS: 200.0000 mg | ORAL_CAPSULE | Freq: Every day | ORAL | 3 refills | Status: DC

## 2018-11-16 MED ORDER — LABETALOL HCL 300 MG PO TABS: 300.0000 mg | ORAL_TABLET | Freq: Two times a day (BID) | ORAL | 2 refills | Status: DC

## 2018-11-16 NOTE — Assessment & Plan Note (Addendum)
History of left-sided lumbar radiculopathy.  Start gabapentin.  Concern for more of a hamstring tendinopathy.  We discussed compression sleeve, home exercises, icing regimen.  Worsening symptoms will consider formal physical therapy.  Patient is in agreement with the plan.  Follow-up again 5 to 6 weeks.
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
## 2018-11-16 NOTE — Progress Notes (Signed)
Patient Care Team: [name], MD as PCP - General (Internal Medicine)  DIAGNOSIS:    ICD-10-CM   1. Normocytic anemia  D64.9 Ferritin    Iron and TIBC    CBC with Differential (Cancer Center Only)    CHIEF COMPLIANT: Follow-up of normocytic anemia to review labs  INTERVAL HISTORY: Carolyn [name] is a 58 y.o. with above-mentioned history of normocytic anemia. I last saw her a year ago. She presents to the clinic today to review recent labs.  She reports to be feeling quite well without any problems or concerns.  Denies any fatigue.  She recently injured her hamstring by running.  She is seeing a sports medicine specialist.  REVIEW OF SYSTEMS:   Constitutional: Denies fevers, chills or abnormal weight loss Eyes: Denies blurriness of vision Ears, nose, mouth, throat, and face: Denies mucositis or sore throat Respiratory: Denies cough, dyspnea or wheezes Cardiovascular: Denies palpitation, chest discomfort Gastrointestinal: Denies nausea, heartburn or change in bowel habits Skin: Denies abnormal skin rashes Lymphatics: Denies new lymphadenopathy or easy bruising Neurological: Denies numbness, tingling or new weaknesses Behavioral/Psych: Mood is stable, no new changes  Extremities: No lower extremity edema Breast: denies any pain or lumps or nodules in either breasts All other systems were reviewed with the patient and are negative.  I have reviewed the past medical history, past surgical history, social history and family history with the patient and they are unchanged from previous note.  ALLERGIES:  is allergic to invokana [canagliflozin]; codeine; flexeril [cyclobenzaprine]; morphine and related; reglan [metoclopramide]; and silicon.  MEDICATIONS:  Current Outpatient Medications  Medication Sig Dispense Refill  . BD PEN NEEDLE MICRO U/F 32G X 6 MM MISC USE 1 DAILY 100 each 3  . blood glucose meter kit and supplies KIT Dispense based on patient and insurance preference.  Use up to four times daily as directed. (FOR ICD-9 250.00, 250.01). 1 each 0  . Blood Glucose Monitoring Suppl (ONETOUCH VERIO IQ SYSTEM) W/DEVICE KIT 1 Act by Does not apply route 3 (three) times daily. 2 kit 0  . cloNIDine (CATAPRES) 0.1 MG tablet Take 2 tablets by mouth once daily 60 tablet 3  . Diclofenac Sodium 2 % SOLN Place 2 g onto the skin 2 (two) times daily. 112 g 3  . EPINEPHrine 0.3 mg/0.3 mL IJ SOAJ injection INJECT 0.3 MLS INTO THE MUSCLE ONCE FOR 1 DOSE  0  . etodolac (LODINE XL) 500 MG 24 hr tablet   0  . Exenatide ER (BYDUREON) 2 MG PEN Inject 1 Act into the skin once a week. 12 each 1  . Fexofenadine HCl (ALLEGRA ALLERGY PO) Take by mouth.    . gabapentin (NEURONTIN) 100 MG capsule Take 2 capsules (200 mg total) by mouth at bedtime. 180 capsule 3  . glucose blood (ONE TOUCH ULTRA TEST) test strip Use to test blood sugar three times a week and prn if having symptoms of low blood sugar Dx: 250.92 90 day supply 100 each 11  . glucose blood (ONETOUCH VERIO) test strip Use TID 100 each 12  . hydrocortisone (ANUSOL-HC) 2.5 % rectal cream Place 1 application rectally 2 (two) times daily as needed for hemorrhoids. 30 g 1  . labetalol (NORMODYNE) 300 MG tablet Take 1 tablet (300 mg total) by mouth 2 (two) times daily. 180 tablet 2  . Lancets (ONETOUCH DELICA PLUS [id number]) MISC USE UP TO 4 TIMES DAILY AS DIRECTED  1  . linaclotide (LINZESS) 290 MCG CAPS capsule Take  1 capsule (290 mcg total) by mouth daily before breakfast. 90 capsule 1  . metFORMIN (GLUCOPHAGE-XR) 500 MG 24 hr tablet TAKE 1 TABLET BY MOUTH ONCE DAILY AFTER SUPPER 90 tablet 1  . omega-3 acid ethyl esters (LOVAZA) 1 g capsule Take 2 capsules (2 g total) by mouth 2 (two) times daily. 360 capsule 1  . omeprazole (PRILOSEC) 40 MG capsule Take 40 mg by mouth 2 (two) times daily.    . simvastatin (ZOCOR) 40 MG tablet Take 1 tablet (40 mg total) by mouth at bedtime. 90 tablet 1  . telmisartan (MICARDIS) 80 MG tablet Take 1  tablet by mouth once daily 90 tablet 0  . triamterene-hydrochlorothiazide (DYAZIDE) 37.5-25 MG capsule TAKE 1 CAPSULE BY MOUTH DAILY 90 capsule 0  . VASCEPA 1 g CAPS TAKE 2 CAPSULES BY MOUTH TWICE DAILY 360 capsule 1   No current facility-administered medications for this visit.     PHYSICAL EXAMINATION: ECOG PERFORMANCE STATUS: 1 - Symptomatic but completely ambulatory  Vitals:   11/17/18 1031  BP: (!) 161/92  Pulse: 93  Resp: 18  Temp: 98.7 F (37.1 C)  SpO2: 100%   Filed Weights   11/17/18 1031  Weight: 188 lb 9.6 oz (85.5 kg)    GENERAL: alert, no distress and comfortable SKIN: skin color, texture, turgor are normal, no rashes or significant lesions EYES: normal, Conjunctiva are pink and non-injected, sclera clear OROPHARYNX: no exudate, no erythema and lips, buccal mucosa, and tongue normal  NECK: supple, thyroid normal size, non-tender, without nodularity LYMPH: no palpable lymphadenopathy in the cervical, axillary or inguinal LUNGS: clear to auscultation and percussion with normal breathing effort HEART: regular rate & rhythm and no murmurs and no lower extremity edema ABDOMEN: abdomen soft, non-tender and normal bowel sounds MUSCULOSKELETAL: no cyanosis of digits and no clubbing  NEURO: alert & oriented x 3 with fluent speech, no focal motor/sensory deficits EXTREMITIES: No lower extremity edema  LABORATORY DATA:  I have reviewed the data as listed CMP Latest Ref Rng & Units 09/30/2018 06/15/2018 11/03/2017  Glucose 70 - 99 mg/dL 129(H) 101(H) 97  BUN 6 - 23 mg/dL '18 20 15  ' Creatinine 0.40 - 1.20 mg/dL 0.87 1.01 1.02  Sodium 135 - 145 mEq/L 137 137 137  Potassium 3.5 - 5.1 mEq/L 3.9 4.0 3.9  Chloride 96 - 112 mEq/L 102 102 103  CO2 19 - 32 mEq/L '25 25 26  ' Calcium 8.4 - 10.5 mg/dL 10.0 9.8 9.9  Total Protein 6.0 - 8.3 g/dL - 7.9 8.3  Total Bilirubin 0.2 - 1.2 mg/dL - 0.4 0.3  Alkaline Phos 39 - 117 U/L - 76 65  AST 0 - 37 U/L - 14 15  ALT 0 - 35 U/L - 24 22     Lab Results  Component Value Date   WBC 9.3 11/17/2018   HGB 9.9 (L) 11/17/2018   HCT 30.5 (L) 11/17/2018   MCV 89.2 11/17/2018   PLT 307 11/17/2018   NEUTROABS 5.7 11/17/2018    ASSESSMENT & PLAN:  Normocytic anemia Normocytic anemia hemoglobin 10.5 on 10/27/2017 Lab review: 11/17/2018: Hemoglobin 9.9, MCV 89.2 rest of the CBC is normal  Felt to be related to anemia of chronic disease, possibly related to diabetes and hypertension Extensive work-up did not reveal any problems with iron, W09, folic acid and micronutrients  Bone marrow biopsy 10/27/2017: Trilineage hematopoiesis, no dyspoiesis.  Cytogenetics were normal  Plan: Watchful monitoring and follow-up in 1 year  If the hemoglobin stays consistently below  10 then we will consider giving erythropoietin stimulating agents.    Orders Placed This Encounter  Procedures  . Ferritin    Standing Status:   Future    Standing Expiration Date:   11/17/2019  . Iron and TIBC    Standing Status:   Future    Standing Expiration Date:   11/17/2019  . CBC with Differential (Cancer Center Only)    Standing Status:   Future    Standing Expiration Date:   11/17/2019   The patient has a good understanding of the overall plan. she agrees with it. she will call with any problems that may develop before the next visit here.  Nicholas Lose, MD 11/17/2018  Julious Oka Dorshimer am acting as scribe for Dr. Nicholas Lose.  I have reviewed the above documentation for accuracy and completeness, and I agree with the above.

## 2018-11-16 NOTE — Patient Instructions (Addendum)
Thigh compression sleeve Try pennsaid in that area Follow up in 6 weeks Gabapentin 200 mg at night

## 2018-11-16 NOTE — Assessment & Plan Note (Signed)
Improvement, continue the brace at night HEP 2 x a week  RTC PRN

## 2018-11-17 ENCOUNTER — Inpatient Hospital Stay: Payer: 59 | Attending: Hematology and Oncology

## 2018-11-17 ENCOUNTER — Inpatient Hospital Stay (HOSPITAL_BASED_OUTPATIENT_CLINIC_OR_DEPARTMENT_OTHER): Payer: 59 | Admitting: Hematology and Oncology

## 2018-11-17 ENCOUNTER — Other Ambulatory Visit: Payer: Self-pay

## 2018-11-17 DIAGNOSIS — D649 Anemia, unspecified: Secondary | ICD-10-CM

## 2018-11-17 DIAGNOSIS — E119 Type 2 diabetes mellitus without complications: Secondary | ICD-10-CM | POA: Insufficient documentation

## 2018-11-17 DIAGNOSIS — Z79899 Other long term (current) drug therapy: Secondary | ICD-10-CM | POA: Diagnosis not present

## 2018-11-17 DIAGNOSIS — Z7984 Long term (current) use of oral hypoglycemic drugs: Secondary | ICD-10-CM | POA: Insufficient documentation

## 2018-11-17 LAB — CMP (CANCER CENTER ONLY)
ALT: 27 U/L (ref 0–44)
AST: 18 U/L (ref 15–41)
Albumin: 4.4 g/dL (ref 3.5–5.0)
Alkaline Phosphatase: 83 U/L (ref 38–126)
Anion gap: 11 (ref 5–15)
BUN: 24 mg/dL — ABNORMAL HIGH (ref 6–20)
CO2: 21 mmol/L — ABNORMAL LOW (ref 22–32)
Calcium: 10 mg/dL (ref 8.9–10.3)
Chloride: 105 mmol/L (ref 98–111)
Creatinine: 1.04 mg/dL — ABNORMAL HIGH (ref 0.44–1.00)
GFR, Est AFR Am: >60 mL/min (ref >60)
GFR, Estimated: >60 mL/min (ref >60)
Glucose, Bld: 132 mg/dL — ABNORMAL HIGH (ref 70–99)
Potassium: 4.3 mmol/L (ref 3.5–5.1)
Sodium: 137 mmol/L (ref 135–145)
Total Bilirubin: 0.3 mg/dL (ref 0.3–1.2)
Total Protein: 8.2 g/dL — ABNORMAL HIGH (ref 6.5–8.1)

## 2018-11-17 LAB — CBC WITH DIFFERENTIAL (CANCER CENTER ONLY)
Abs Immature Granulocytes: 0.02 10*3/uL (ref 0.00–0.07)
Basophils Absolute: 0.1 10*3/uL (ref 0.0–0.1)
Basophils Relative: 1 %
Eosinophils Absolute: 0.2 10*3/uL (ref 0.0–0.5)
Eosinophils Relative: 3 %
HCT: 30.5 % — ABNORMAL LOW (ref 36.0–46.0)
Hemoglobin: 9.9 g/dL — ABNORMAL LOW (ref 12.0–15.0)
Immature Granulocytes: 0 %
Lymphocytes Relative: 30 %
Lymphs Abs: 2.8 10*3/uL (ref 0.7–4.0)
MCH: 28.9 pg (ref 26.0–34.0)
MCHC: 32.5 g/dL (ref 30.0–36.0)
MCV: 89.2 fL (ref 80.0–100.0)
Monocytes Absolute: 0.5 10*3/uL (ref 0.1–1.0)
Monocytes Relative: 5 %
Neutro Abs: 5.7 10*3/uL (ref 1.7–7.7)
Neutrophils Relative %: 61 %
Platelet Count: 307 10*3/uL (ref 150–400)
RBC: 3.42 MIL/uL — ABNORMAL LOW (ref 3.87–5.11)
RDW: 13.2 % (ref 11.5–15.5)
WBC Count: 9.3 10*3/uL (ref 4.0–10.5)
nRBC: 0 % (ref 0.0–0.2)

## 2018-11-18 ENCOUNTER — Other Ambulatory Visit: Payer: Self-pay | Admitting: Internal Medicine

## 2018-11-18 ENCOUNTER — Telehealth: Payer: Self-pay | Admitting: Hematology and Oncology

## 2018-11-18 NOTE — Telephone Encounter (Signed)
I left a message regarding schedule for next yr

## 2018-11-19 ENCOUNTER — Encounter: Payer: Self-pay | Admitting: Hematology and Oncology

## 2018-11-27 ENCOUNTER — Encounter: Payer: Self-pay | Admitting: Internal Medicine

## 2018-12-05 ENCOUNTER — Other Ambulatory Visit: Payer: Self-pay | Admitting: Internal Medicine

## 2018-12-05 DIAGNOSIS — I10 Essential (primary) hypertension: Secondary | ICD-10-CM

## 2018-12-05 DIAGNOSIS — E118 Type 2 diabetes mellitus with unspecified complications: Secondary | ICD-10-CM

## 2018-12-08 ENCOUNTER — Encounter: Payer: Self-pay | Admitting: Internal Medicine

## 2018-12-11 ENCOUNTER — Telehealth: Payer: Self-pay

## 2018-12-11 NOTE — Telephone Encounter (Signed)
Key: [id number]

## 2018-12-13 ENCOUNTER — Other Ambulatory Visit: Payer: Self-pay | Admitting: Internal Medicine

## 2018-12-29 ENCOUNTER — Encounter: Payer: Self-pay | Admitting: Internal Medicine

## 2018-12-30 ENCOUNTER — Other Ambulatory Visit: Payer: Self-pay | Admitting: Internal Medicine

## 2018-12-30 DIAGNOSIS — E118 Type 2 diabetes mellitus with unspecified complications: Secondary | ICD-10-CM

## 2018-12-30 MED ORDER — RYBELSUS 7 MG PO TABS: 1.0000 | ORAL_TABLET | Freq: Every day | ORAL | 1 refills | Status: DC

## 2019-01-13 ENCOUNTER — Telehealth: Payer: Self-pay

## 2019-01-13 ENCOUNTER — Other Ambulatory Visit: Payer: Self-pay | Admitting: Internal Medicine

## 2019-01-13 DIAGNOSIS — E781 Pure hyperglyceridemia: Secondary | ICD-10-CM

## 2019-01-13 MED ORDER — OMEGA-3-ACID ETHYL ESTERS 1 G PO CAPS: 2.0000 g | ORAL_CAPSULE | Freq: Two times a day (BID) | ORAL | 1 refills | Status: DC

## 2019-01-13 NOTE — Telephone Encounter (Signed)
PA for Vascepa has been denied. Is there an alternative you would like her to start?

## 2019-01-13 NOTE — Telephone Encounter (Signed)
Key: [id number]

## 2019-01-13 NOTE — Telephone Encounter (Signed)
Try lovaza  TJ

## 2019-01-18 ENCOUNTER — Other Ambulatory Visit: Payer: Self-pay | Admitting: Internal Medicine

## 2019-01-19 ENCOUNTER — Other Ambulatory Visit: Payer: Self-pay | Admitting: Family

## 2019-01-19 ENCOUNTER — Telehealth: Payer: Self-pay | Admitting: Cardiovascular Disease

## 2019-01-19 DIAGNOSIS — K5901 Slow transit constipation: Secondary | ICD-10-CM

## 2019-01-19 NOTE — Telephone Encounter (Signed)
New message:    Patient is having chest pain off and on, also patient was woke up by chest pains. Patient also would like to have a sooner appt with the doctor. Please call patient.

## 2019-01-19 NOTE — Telephone Encounter (Signed)
Pain does not sound cardiac can f/u with me next available

## 2019-01-19 NOTE — Telephone Encounter (Signed)
Spoke with patient about recent c/o of chest pain.  It has happened twice - once last week while sleeping she was laying on her left side and woke with a "soreness" on that side.  On Sunday while cooking (chopping cabbage) she had discomfort in her chest with movement and had to sit down.  It resolved on it's own with rest.  At that time she had no other s/s.  She has had some nausea that she reports since a medication change to Rybelsus, even reporting vomiting X 1 since that change but not r/t chest pain or discomfort.  Yesterday she went for a walk and had no discomfort/pain/SOB/N,V etc.  She states "I just don't want anything to be wrong with my ticker and I would feel better if I got to see him in the office".  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  Advised I will forward to Dr Phylliss Bob and his nurse to determine when to schedule.  Pt states understanding and will c/b if any concerns prior to hearing back from them.

## 2019-01-19 NOTE — Telephone Encounter (Signed)
Called patient with Dr. Kyla Balzarine advisement. Patient agreed with plan. Made patient an appointment on 02/04/19 the next available appointment with Dr. Johnsie Cancel.

## 2019-01-28 ENCOUNTER — Ambulatory Visit: Payer: Self-pay | Admitting: *Deleted

## 2019-01-28 NOTE — Telephone Encounter (Signed)
Patient contacted by scheduling---can either do virtual visit with a provider tomorrow or go to urgent care today---patient has scheduled virtual visit for tomorrow

## 2019-01-28 NOTE — Telephone Encounter (Signed)
Patient doing virtual visit with Mickel Baas tomorrow morning.

## 2019-01-28 NOTE — Telephone Encounter (Signed)
Pt called with complaints of pain on right side by ribs that worsens with movement; she has also had a headache intermittently for 2 weeks; she says that the headache is her greatest concern; it is rated 9 out of 10; she says it is at the top of her head to the bridge of her nose; she also has nasal congestion which is worse at night; she says that her eyes feel weird like something is in them; she has been taking tylenol which helped; recommendations made per nurse triage protocol; she verbalized understanding; attempted to contact [facility] x 2 without success; the pt sees Dr Ronnald Ramp, [place]; she can be contacted at [phone]; will route to office for final disposition.    Reason for Disposition . [1] MODERATE headache (e.g., interferes with normal activities) AND [2] present > 24 hours AND [3] unexplained  (Exceptions: analgesics not tried, typical migraine, or headache part of viral illness)  Answer Assessment - Initial Assessment Questions 1. LOCATION: "Where does it hurt?"      Top of nose to bridge of nose 2. ONSET: "When did the headache start?" (Minutes, hours or days)      2 weeks ago 3. PATTERN: "Does the pain come and go, or has it been constant since it started?"    intermittent 4. SEVERITY: "How bad is the pain?" and "What does it keep you from doing?"  (e.g., Scale 1-10; mild, moderate, or severe)   - MILD (1-3): doesn't interfere with normal activities    - MODERATE (4-7): interferes with normal activities or awakens from sleep    - SEVERE (8-10): excruciating pain, unable to do any normal activities       9 out of 10 5. RECURRENT SYMPTOM: "Have you ever had headaches before?" If so, ask: "When was the last time?" and "What happened that time?"      Yes, BP high 6. CAUSE: "What do you think is causing the headache?"    Not sure 7. MIGRAINE: "Have you been diagnosed with migraine headaches?" If so, ask: "Is this headache similar?"      yes 8. HEAD INJURY: "Has there been any recent  injury to the head?"      no 9. OTHER SYMPTOMS: "Do you have any other symptoms?" (fever, stiff neck, eye pain, sore throat, cold symptoms)    Nasal congestion, eyes feel like something is in them 10. PREGNANCY: "Is there any chance you are pregnant?" "When was your last menstrual period?"     No hysterectomy  Protocols used: HEADACHE-A-AH

## 2019-01-29 ENCOUNTER — Encounter: Payer: Self-pay | Admitting: Family

## 2019-01-29 ENCOUNTER — Ambulatory Visit (INDEPENDENT_AMBULATORY_CARE_PROVIDER_SITE_OTHER): Payer: 59 | Admitting: Family

## 2019-01-29 DIAGNOSIS — J019 Acute sinusitis, unspecified: Secondary | ICD-10-CM | POA: Diagnosis not present

## 2019-01-29 DIAGNOSIS — J209 Acute bronchitis, unspecified: Secondary | ICD-10-CM

## 2019-01-29 MED ORDER — TRIAMTERENE-HCTZ 37.5-25 MG PO CAPS: 1.0000 | ORAL_CAPSULE | Freq: Every day | ORAL | 0 refills | Status: DC

## 2019-01-29 MED ORDER — FLUTICASONE PROPIONATE 50 MCG/ACT NA SUSP: 2.0000 | Freq: Every day | NASAL | 6 refills | Status: DC

## 2019-01-29 MED ORDER — AMOXICILLIN-POT CLAVULANATE 875-125 MG PO TABS: 1.0000 | ORAL_TABLET | Freq: Two times a day (BID) | ORAL | 0 refills | Status: DC

## 2019-01-29 NOTE — Progress Notes (Signed)
Carolyn [name] is a 58 y.o. female with the following history as recorded in [employer]:  Patient Active Problem List   Diagnosis Date Noted  . De Quervain's tenosynovitis, left 07/01/2018  . Normocytic anemia 10/22/2017  . Anemia in other chronic diseases classified elsewhere 07/29/2017  . 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 Slow transit constipation 10/19/2015  . Hypertriglyceridemia 05/25/2015  . Routine general medical examination at a health care facility 05/25/2015  . Visit for screening mammogram 10/27/2014  . Left lumbar radiculitis 10/23/2013  . Obesity (BMI 30-39.9) 09/20/2013  . Decreased cardiac ejection fraction 09/17/2013  . Snoring 07/30/2013  . Intermediate coronary syndrome, progressive angina 01/14/2013  . Type II diabetes mellitus with manifestations ([place]) 12/02/2012  . GERD (gastroesophageal reflux disease)   . Hyperlipidemia with target LDL less than 100   . Allergic rhinitis 07/22/2010  . Essential hypertension 05/25/2010    Current Outpatient Medications  Medication Sig Dispense Refill  . amoxicillin-clavulanate (AUGMENTIN) 875-125 MG tablet Take 1 tablet by mouth 2 (two) times daily. 20 tablet 0  . BD PEN NEEDLE MICRO U/F 32G X 6 MM MISC USE 1 DAILY 100 each 3  . blood glucose meter kit and supplies KIT Dispense based on patient and insurance preference. Use up to four times daily as directed. (FOR ICD-9 250.00, 250.01). 1 each 0  . Blood Glucose Monitoring Suppl (ONETOUCH VERIO IQ SYSTEM) W/DEVICE KIT 1 Act by Does not apply route 3 (three) times daily. 2 kit 0  . cloNIDine (CATAPRES) 0.1 MG tablet Take 2 tablets by mouth once daily 60 tablet 3  . Diclofenac Sodium 2 % SOLN Place 2 g onto the skin 2 (two) times daily. 112 g 3  . EPINEPHrine 0.3 mg/0.3 mL IJ SOAJ injection INJECT 0.3 MLS INTO THE MUSCLE ONCE FOR 1 DOSE  0  . etodolac (LODINE XL) 500 MG 24 hr tablet   0  .  Fexofenadine HCl (ALLEGRA ALLERGY PO) Take by mouth.    . fluticasone (FLONASE) 50 MCG/ACT nasal spray Place 2 sprays into both nostrils daily. 16 g 6  . gabapentin (NEURONTIN) 100 MG capsule Take 2 capsules (200 mg total) by mouth at bedtime. 180 capsule 3  . glucose blood (ONE TOUCH ULTRA TEST) test strip Use to test blood sugar three times a week and prn if having symptoms of low blood sugar Dx: 250.92 90 day supply 100 each 11  . glucose blood (ONETOUCH VERIO) test strip Use TID 100 each 12  . hydrocortisone (ANUSOL-HC) 2.5 % rectal cream Place 1 application rectally 2 (two) times daily as needed for hemorrhoids. 30 g 1  . labetalol (NORMODYNE) 300 MG tablet Take 1 tablet (300 mg total) by mouth 2 (two) times daily. 180 tablet 2  . Lancets (ONETOUCH DELICA PLUS [id number]) MISC USE UP TO 4 TIMES DAILY AS DIRECTED  1  . LINZESS 290 MCG CAPS capsule TAKE 1 CAPSULE BY MOUTH ONCE DAILY BEFORE BREAKFAST 90 capsule 1  . metFORMIN (GLUCOPHAGE-XR) 500 MG 24 hr tablet TAKE 1 TABLET BY MOUTH ONCE DAILY AFTER SUPPER 90 tablet 1  . omega-3 acid ethyl esters (LOVAZA) 1 g capsule Take 2 capsules (2 g total) by mouth 2 (two) times daily. 360 capsule 1  . omeprazole (PRILOSEC) 40 MG capsule TAKE 1 CAPSULE BY MOUTH TWICE DAILY AT 8AM AND 10PM 60 capsule 5  . Semaglutide (RYBELSUS) 7 MG TABS Take 1 tablet by mouth  daily. 90 tablet 1  . simvastatin (ZOCOR) 40 MG tablet Take 1 tablet (40 mg total) by mouth at bedtime. 90 tablet 1  . telmisartan (MICARDIS) 80 MG tablet Take 1 tablet by mouth once daily 90 tablet 0  . triamterene-hydrochlorothiazide (DYAZIDE) 37.5-25 MG capsule Take 1 each (1 capsule total) by mouth daily. 90 capsule 0  . VASCEPA 1 g CAPS TAKE 2 CAPSULES BY MOUTH TWICE DAILY 360 capsule 1   No current facility-administered medications for this visit.     Allergies: Invokana [canagliflozin], Codeine, Flexeril [cyclobenzaprine], Morphine and related, Reglan [metoclopramide], and Silicon  Past  Medical History:  Diagnosis Date  . Allergy    seasonal  . Anemia    Anemia of chronic disease suspected  . Anxiety attack   . Arthritis   . Benign fundic gland polyps of stomach   . Dyslipidemia   . Exertional shortness of breath   . GERD (gastroesophageal reflux disease)   . Heart murmur   . Hyperlipidemia   . Hypertension   . Migraines    "probably weekly" (01/14/2013)  . Type II diabetes mellitus ([place])     Past Surgical History:  Procedure Laterality Date  . CHOLECYSTECTOMY  1990  . COLONOSCOPY    . ENDOMETRIAL ABLATION  ~ 2000-2002   "twice" (01/14/2013)  . LEFT HEART CATHETERIZATION WITH CORONARY ANGIOGRAM N/A 01/15/2013   Procedure: LEFT HEART CATHETERIZATION WITH CORONARY ANGIOGRAM;  Surgeon: Blane Ohara, MD;  Location[facility] CATH LAB;  Service: Cardiovascular;  Laterality: N/A;  . LEFT OOPHORECTOMY Left ~ 2003  . ORIF TIBIA & FIBULA FRACTURES Left 2003  . VAGINAL HYSTERECTOMY  ~ 2003    Family History  Problem Relation Age of Onset  . Alzheimer's disease Mother   . Hypertension Mother   . Emphysema Father   . Thyroid cancer Sister   . Cancer Sister        Breast Cancer  . Diabetes Sister   . Diabetes Sister   . Emphysema Sister   . Diabetes Brother   . Colon cancer Neg Hx     Social History   Tobacco Use  . Smoking status: Never Smoker  . Smokeless tobacco: Never Used  Substance Use Topics  . Alcohol use: No    Alcohol/week: 0.0 standard drinks    Comment: socially    Subjective:   I connected with Carolyn [name] on 01/29/19 at  8:40 AM EDT by a telephone call  and verified that I am speaking with the correct person using two identifiers. Provider is in office and patient is at her home; patient and I are only 2 people on video call.    I discussed the limitations of evaluation and management by telemedicine and the availability of in person appointments. The patient expressed understanding and agreed to proceed.  2 week history of sinus pain/  pressure; feels like symptoms are moving into her chest; denies any chest pain or shortness of breath; feels like congestion is localized in upper part of her chest; no fever, no loss of sense of taste or smell. Has been checking her blood pressure and very well controlled at 117/78.     Objective:  There were no vitals filed for this visit.  Lungs: Respirations unlabored;  Neurologic: Alert and oriented; speech intact;   Assessment:  1. Acute sinusitis, recurrence not specified, unspecified location   2. Acute bronchitis, unspecified organism     Plan:  Rx for Augmentin 875 mg bid x 10  days and Flonase NS; increase fluids, rest and follow-up worse, no better; She agrees to in office visit if symptoms persist.  Spent 11 minutes on phone call today  No follow-ups on file.  No orders of the defined types were placed in this encounter.   Requested Prescriptions   Signed Prescriptions Disp Refills  . fluticasone (FLONASE) 50 MCG/ACT nasal spray 16 g 6    Sig: Place 2 sprays into both nostrils daily.  Marland Kitchen triamterene-hydrochlorothiazide (DYAZIDE) 37.5-25 MG capsule 90 capsule 0    Sig: Take 1 each (1 capsule total) by mouth daily.  Marland Kitchen amoxicillin-clavulanate (AUGMENTIN) 875-125 MG tablet 20 tablet 0    Sig: Take 1 tablet by mouth 2 (two) times daily.

## 2019-02-04 ENCOUNTER — Ambulatory Visit: Payer: 59 | Admitting: Cardiovascular Disease

## 2019-02-11 ENCOUNTER — Encounter: Payer: Self-pay | Admitting: Family

## 2019-02-11 ENCOUNTER — Other Ambulatory Visit: Payer: Self-pay | Admitting: Internal Medicine

## 2019-02-11 DIAGNOSIS — B3731 Acute candidiasis of vulva and vagina: Secondary | ICD-10-CM

## 2019-02-11 DIAGNOSIS — B373 Candidiasis of vulva and vagina: Secondary | ICD-10-CM

## 2019-02-11 MED ORDER — FLUCONAZOLE 150 MG PO TABS: 150.0000 mg | ORAL_TABLET | Freq: Once | ORAL | 3 refills | Status: AC

## 2019-02-22 ENCOUNTER — Encounter: Payer: Self-pay | Admitting: Internal Medicine

## 2019-02-22 ENCOUNTER — Ambulatory Visit (INDEPENDENT_AMBULATORY_CARE_PROVIDER_SITE_OTHER): Payer: 59 | Admitting: Internal Medicine

## 2019-02-22 ENCOUNTER — Other Ambulatory Visit (INDEPENDENT_AMBULATORY_CARE_PROVIDER_SITE_OTHER): Payer: 59

## 2019-02-22 ENCOUNTER — Ambulatory Visit (INDEPENDENT_AMBULATORY_CARE_PROVIDER_SITE_OTHER)
Admission: RE | Admit: 2019-02-22 | Discharge: 2019-02-22 | Disposition: A | Discharge status: Home / Self Care | Payer: 59 | Source: Ambulatory Visit | Attending: Internal Medicine | Admitting: Internal Medicine

## 2019-02-22 ENCOUNTER — Other Ambulatory Visit: Payer: Self-pay | Admitting: Internal Medicine

## 2019-02-22 ENCOUNTER — Other Ambulatory Visit: Payer: Self-pay

## 2019-02-22 VITALS — BP 142/86 | HR 95 | Temp 98.0°F | Resp 16 | Ht 64.0 in | Wt 188.0 lb

## 2019-02-22 DIAGNOSIS — Z23 Encounter for immunization: Secondary | ICD-10-CM | POA: Diagnosis not present

## 2019-02-22 DIAGNOSIS — E781 Pure hyperglyceridemia: Secondary | ICD-10-CM | POA: Diagnosis not present

## 2019-02-22 DIAGNOSIS — E118 Type 2 diabetes mellitus with unspecified complications: Secondary | ICD-10-CM

## 2019-02-22 DIAGNOSIS — D649 Anemia, unspecified: Secondary | ICD-10-CM | POA: Diagnosis not present

## 2019-02-22 DIAGNOSIS — I10 Essential (primary) hypertension: Secondary | ICD-10-CM | POA: Diagnosis not present

## 2019-02-22 DIAGNOSIS — D638 Anemia in other chronic diseases classified elsewhere: Secondary | ICD-10-CM

## 2019-02-22 DIAGNOSIS — M7062 Trochanteric bursitis, left hip: Secondary | ICD-10-CM | POA: Insufficient documentation

## 2019-02-22 DIAGNOSIS — M25552 Pain in left hip: Secondary | ICD-10-CM

## 2019-02-22 DIAGNOSIS — G8929 Other chronic pain: Secondary | ICD-10-CM

## 2019-02-22 DIAGNOSIS — R109 Unspecified abdominal pain: Secondary | ICD-10-CM

## 2019-02-22 LAB — URINALYSIS, ROUTINE W REFLEX MICROSCOPIC
Bilirubin Urine: NEGATIVE
Hgb urine dipstick: NEGATIVE
Ketones, ur: NEGATIVE
Leukocytes,Ua: NEGATIVE
Nitrite: NEGATIVE
RBC / HPF: NONE SEEN (ref 0–?)
Specific Gravity, Urine: 1.02 (ref 1.000–1.030)
Total Protein, Urine: 30 — AB
Urine Glucose: NEGATIVE
Urobilinogen, UA: 0.2 (ref 0.0–1.0)
pH: 6 (ref 5.0–8.0)

## 2019-02-22 LAB — CBC WITH DIFFERENTIAL/PLATELET
Basophils Absolute: 0 10*3/uL (ref 0.0–0.1)
Basophils Relative: 0.4 % (ref 0.0–3.0)
Eosinophils Absolute: 0.2 10*3/uL (ref 0.0–0.7)
Eosinophils Relative: 1.9 % (ref 0.0–5.0)
HCT: 31.5 % — ABNORMAL LOW (ref 36.0–46.0)
Hemoglobin: 10.6 g/dL — ABNORMAL LOW (ref 12.0–15.0)
Lymphocytes Relative: 31.1 % (ref 12.0–46.0)
Lymphs Abs: 2.8 10*3/uL (ref 0.7–4.0)
MCHC: 33.8 g/dL (ref 30.0–36.0)
MCV: 85.7 fl (ref 78.0–100.0)
Monocytes Absolute: 0.5 10*3/uL (ref 0.1–1.0)
Monocytes Relative: 6.1 % (ref 3.0–12.0)
Neutro Abs: 5.4 10*3/uL (ref 1.4–7.7)
Neutrophils Relative %: 60.5 % (ref 43.0–77.0)
Platelets: 312 10*3/uL (ref 150.0–400.0)
RBC: 3.67 Mil/uL — ABNORMAL LOW (ref 3.87–5.11)
RDW: 13.8 % (ref 11.5–15.5)
WBC: 8.9 10*3/uL (ref 4.0–10.5)

## 2019-02-22 LAB — BASIC METABOLIC PANEL
BUN: 18 mg/dL (ref 6–23)
CO2: 23 mEq/L (ref 19–32)
Calcium: 9.9 mg/dL (ref 8.4–10.5)
Chloride: 101 mEq/L (ref 96–112)
Creatinine, Ser: 0.97 mg/dL (ref 0.40–1.20)
GFR: 71.62 mL/min (ref >60.00)
Glucose, Bld: 135 mg/dL — ABNORMAL HIGH (ref 70–99)
Potassium: 3.9 mEq/L (ref 3.5–5.1)
Sodium: 136 mEq/L (ref 135–145)

## 2019-02-22 LAB — HEMOGLOBIN A1C: Hgb A1c MFr Bld: 6.9 % — ABNORMAL HIGH (ref 4.6–6.5)

## 2019-02-22 LAB — TRIGLYCERIDES: Triglycerides: 390 mg/dL — ABNORMAL HIGH (ref 0.0–149.0)

## 2019-02-22 MED ORDER — METHYLPREDNISOLONE 4 MG PO TBPK: ORAL_TABLET | ORAL | 0 refills | Status: AC

## 2019-02-22 NOTE — Patient Instructions (Signed)

## 2019-02-22 NOTE — Progress Notes (Signed)
Subjective:  Patient ID: Carolyn [name], female    DOB: 1960/06/02  Age: 58 y.o. MRN: [id number]  CC: Anemia, Hypertension, and Diabetes   HPI Carolyn [name] presents for f/up - She complains of a 4-day history of nontraumatic left hip pain that radiates down her left lower extremity.  She is not getting much symptom relief with Aleve and Tylenol.  She also complains of bilateral, lateral flank pain.  It sounds like this has been going on for several months.  She denies cough, hemoptysis, abdominal pain, dysuria, hematuria, nausea, or vomiting.  Outpatient Medications Prior to Visit  Medication Sig Dispense Refill  . BD PEN NEEDLE MICRO U/F 32G X 6 MM MISC USE 1 DAILY 100 each 3  . blood glucose meter kit and supplies KIT Dispense based on patient and insurance preference. Use up to four times daily as directed. (FOR ICD-9 250.00, 250.01). 1 each 0  . Blood Glucose Monitoring Suppl (ONETOUCH VERIO IQ SYSTEM) W/DEVICE KIT 1 Act by Does not apply route 3 (three) times daily. 2 kit 0  . cloNIDine (CATAPRES) 0.1 MG tablet Take 2 tablets by mouth once daily 60 tablet 3  . Diclofenac Sodium 2 % SOLN Place 2 g onto the skin 2 (two) times daily. 112 g 3  . EPINEPHrine 0.3 mg/0.3 mL IJ SOAJ injection INJECT 0.3 MLS INTO THE MUSCLE ONCE FOR 1 DOSE  0  . Fexofenadine HCl (ALLEGRA ALLERGY PO) Take by mouth.    . fluticasone (FLONASE) 50 MCG/ACT nasal spray Place 2 sprays into both nostrils daily. 16 g 6  . gabapentin (NEURONTIN) 100 MG capsule Take 2 capsules (200 mg total) by mouth at bedtime. 180 capsule 3  . glucose blood (ONE TOUCH ULTRA TEST) test strip Use to test blood sugar three times a week and prn if having symptoms of low blood sugar Dx: 250.92 90 day supply 100 each 11  . glucose blood (ONETOUCH VERIO) test strip Use TID 100 each 12  . hydrocortisone (ANUSOL-HC) 2.5 % rectal cream Place 1 application rectally 2 (two) times daily as needed for hemorrhoids. 30 g 1  . labetalol (NORMODYNE)  300 MG tablet Take 1 tablet (300 mg total) by mouth 2 (two) times daily. 180 tablet 2  . Lancets (ONETOUCH DELICA PLUS [id number]) MISC USE UP TO 4 TIMES DAILY AS DIRECTED  1  . omega-3 acid ethyl esters (LOVAZA) 1 g capsule Take 2 capsules (2 g total) by mouth 2 (two) times daily. 360 capsule 1  . omeprazole (PRILOSEC) 40 MG capsule TAKE 1 CAPSULE BY MOUTH TWICE DAILY AT 8AM AND 10PM 60 capsule 5  . Semaglutide (RYBELSUS) 7 MG TABS Take 1 tablet by mouth daily. 90 tablet 1  . simvastatin (ZOCOR) 40 MG tablet Take 1 tablet (40 mg total) by mouth at bedtime. 90 tablet 1  . telmisartan (MICARDIS) 80 MG tablet Take 1 tablet by mouth once daily 90 tablet 0  . triamterene-hydrochlorothiazide (DYAZIDE) 37.5-25 MG capsule Take 1 each (1 capsule total) by mouth daily. 90 capsule 0  . etodolac (LODINE XL) 500 MG 24 hr tablet   0  . LINZESS 290 MCG CAPS capsule TAKE 1 CAPSULE BY MOUTH ONCE DAILY BEFORE BREAKFAST 90 capsule 1  . VASCEPA 1 g CAPS TAKE 2 CAPSULES BY MOUTH TWICE DAILY 360 capsule 1  . amoxicillin-clavulanate (AUGMENTIN) 875-125 MG tablet Take 1 tablet by mouth 2 (two) times daily. 20 tablet 0  . metFORMIN (GLUCOPHAGE-XR) 500 MG 24 hr tablet  TAKE 1 TABLET BY MOUTH ONCE DAILY AFTER SUPPER (Patient not taking: TAKE 1 TABLET BY MOUTH ONCE DAILY AFTER SUPPER) 90 tablet 1   No facility-administered medications prior to visit.     ROS Review of Systems  Constitutional: Negative.  Negative for chills, fatigue and fever.  HENT: Negative.   Eyes: Negative for visual disturbance.  Respiratory: Negative for cough, chest tightness, shortness of breath and wheezing.   Cardiovascular: Negative for chest pain, palpitations and leg swelling.  Gastrointestinal: Negative for abdominal pain, constipation, diarrhea, nausea and vomiting.  Endocrine: Negative.   Genitourinary: Positive for flank pain. Negative for decreased urine volume, difficulty urinating, dysuria, hematuria and urgency.  Musculoskeletal:  Positive for arthralgias. Negative for back pain, myalgias and neck pain.  Skin: Negative.  Negative for color change and pallor.  Neurological: Negative.  Negative for dizziness, light-headedness and numbness.  Hematological: Negative for adenopathy. Does not bruise/bleed easily.    Objective:  BP (!) 142/86 (BP Location: Left Arm, Patient Position: Sitting, Cuff Size: Large)   Pulse 95   Temp 98 F (36.7 C) (Oral)   Resp 16   Ht _0  (1.626 m)   Wt 188 lb (85.3 kg)   SpO2 97%   BMI 32.27 kg/m   BP Readings from Last 3 Encounters:  02/22/19 (!) 142/86  11/17/18 (!) 161/92  11/16/18 (!) 142/82    Wt Readings from Last 3 Encounters:  02/22/19 188 lb (85.3 kg)  11/17/18 188 lb 9.6 oz (85.5 kg)  11/16/18 188 lb (85.3 kg)    Physical Exam Vitals signs reviewed.  Constitutional:      Appearance: Normal appearance. She is obese.  HENT:     Nose: Nose normal.     Mouth/Throat:     Pharynx: No oropharyngeal exudate.  Eyes:     General: No scleral icterus.    Conjunctiva/sclera: Conjunctivae normal.  Neck:     Musculoskeletal: No muscular tenderness.  Cardiovascular:     Rate and Rhythm: Normal rate and regular rhythm.     Heart sounds: No murmur. No gallop.   Pulmonary:     Effort: Pulmonary effort is normal.     Breath sounds: No stridor. No wheezing, rhonchi or rales.  Abdominal:     General: Abdomen is protuberant. Bowel sounds are normal.     Palpations: Abdomen is soft. There is no hepatomegaly or splenomegaly.     Tenderness: There is no abdominal tenderness. There is no right CVA tenderness or left CVA tenderness.  Musculoskeletal: Normal range of motion.     Left hip: She exhibits tenderness and bony tenderness (over the GT). She exhibits normal range of motion, normal strength, no swelling, no crepitus and no deformity.     Right lower leg: No edema.     Left lower leg: No edema.  Lymphadenopathy:     Cervical: No cervical adenopathy.  Skin:    General:  Skin is warm and dry.  Neurological:     General: No focal deficit present.     Mental Status: She is alert.  Psychiatric:        Mood and Affect: Mood normal.        Behavior: Behavior normal.     Lab Results  Component Value Date   WBC 8.9 02/22/2019   HGB 10.6 (L) 02/22/2019   HCT 31.5 (L) 02/22/2019   PLT 312.0 02/22/2019   GLUCOSE 135 (H) 02/22/2019   CHOL 161 06/15/2018   TRIG 390.0 (H) 02/22/2019  HDL 34.40 (L) 06/15/2018   LDLDIRECT 81.0 06/15/2018   LDLCALC 68 07/30/2013   ALT 27 11/17/2018   AST 18 11/17/2018   NA 136 02/22/2019   K 3.9 02/22/2019   CL 101 02/22/2019   CREATININE 0.97 02/22/2019   BUN 18 02/22/2019   CO2 23 02/22/2019   TSH 1.989 07/10/2017   INR 1.04 01/14/2013   HGBA1C 6.9 (H) 02/22/2019   MICROALBUR 18.0 (H) 09/30/2018    Mm 3d Screen Breast Bilateral  Result Date: 10/05/2018 CLINICAL DATA:  Screening. EXAM: DIGITAL SCREENING BILATERAL MAMMOGRAM WITH TOMO AND CAD COMPARISON:  Previous exam(s). ACR Breast Density Category b: There are scattered areas of fibroglandular density. FINDINGS: There are no findings suspicious for malignancy. Images were processed with CAD. IMPRESSION: No mammographic evidence of malignancy. A result letter of this screening mammogram will be mailed directly to the patient. RECOMMENDATION: Screening mammogram in one year. (Code:SM-B-01Y) BI-RADS CATEGORY  1: Negative. Electronically Signed   By: Lajean Manes M.D.   On: 10/05/2018 13:30   Dg Abd Acute W/chest  Result Date: 02/22/2019 CLINICAL DATA:  Right flank pain EXAM: DG ABDOMEN ACUTE W/ 1V CHEST COMPARISON:  02/06/2018 FINDINGS: The heart size is enlarged. Bibasilar atelectasis is noted. There is no pneumothorax. No large pleural effusion. Aortic calcifications are noted. The bowel gas pattern is nonobstructive. No radiopaque calcifications are noted. The patient is status post prior cholecystectomy. There is a prominent Riedel's lobe. IMPRESSION: Negative  abdominal radiographs.  No acute cardiopulmonary disease. Electronically Signed   By: Constance Holster M.D.   On: 02/22/2019 15:27   Dg Hip Unilat With Pelvis 2-3 Views Left  Result Date: 02/22/2019 CLINICAL DATA:  58 year old female with left hip pain. EXAM: DG HIP (WITH OR WITHOUT PELVIS) 2-3V LEFT COMPARISON:  Abdominal radiograph dated 02/22/2019. FINDINGS: There is no acute fracture or dislocation. Mild arthritic changes with mild joint space narrowing and spurring. The soft tissues are unremarkable. IMPRESSION: No acute fracture or dislocation. Electronically Signed   By: Anner Crete M.D.   On: 02/22/2019 15:26     Assessment & Plan:   Venessa was seen today for anemia, hypertension and diabetes.  Diagnoses and all orders for this visit:  Essential hypertension- Her blood pressure is adequately well controlled.  Electrolytes and renal function are normal. -     Basic metabolic panel; Future  Type II diabetes mellitus with manifestations ([place])- She has achieved adequate glycemic control. -     Basic metabolic panel; Future -     Hemoglobin A1c; Future  Anemia in other chronic diseases classified elsewhere- Her H&H are stable.  Will continue to monitor this. -     CBC with Differential; Future  Normocytic anemia -     CBC with Differential; Future  Hypertriglyceridemia- Her triglycerides remain elevated.  I have asked her to improve her lifestyle modifications. -     Triglycerides; Future  Bilateral flank pain- She has a paucity of symptoms.  Examination is unremarkable.  Plain films and labs are reassuring.  This is likely musculoskeletal pain. -     DG Abd Acute W/Chest; Future -     Urinalysis, Routine w reflex microscopic; Future  Chronic left hip pain- She has osteoarthritis in the hip as well as greater trochanteric bursitis.  We will try a course of methylprednisolone and have asked her to see sports medicine. -     Cancel: DG HIP UNILAT WITH PELVIS MIN 4 VIEWS  LEFT; Future  Greater trochanteric bursitis  of left hip -     Ambulatory referral to Sports Medicine -     methylPREDNISolone (MEDROL DOSEPAK) 4 MG TBPK tablet; TAKE AS DIRECTED  Need for influenza vaccination -     Flu Vaccine QUAD 36+ mos IM  Need for pneumococcal vaccination -     Pneumococcal polysaccharide vaccine 23-valent greater than or equal to 2yo subcutaneous/IM   I have discontinued Khloi H. Pustejovsky's etodolac, Vascepa, metFORMIN, Linzess, and amoxicillin-clavulanate. I am also having her start on methylPREDNISolone. Additionally, I am having her maintain her glucose blood, glucose blood, OneTouch Verio IQ System, blood glucose meter kit and supplies, EPINEPHrine, OneTouch Delica Plus [id number], BD Pen Needle Micro U/F, hydrocortisone, Fexofenadine HCl (ALLEGRA ALLERGY PO), simvastatin, Diclofenac Sodium, gabapentin, labetalol, telmisartan, cloNIDine, Rybelsus, omega-3 acid ethyl esters, omeprazole, fluticasone, and triamterene-hydrochlorothiazide.  Meds ordered this encounter  Medications  . methylPREDNISolone (MEDROL DOSEPAK) 4 MG TBPK tablet    Sig: TAKE AS DIRECTED    Dispense:  21 tablet    Refill:  0     Follow-up: Return in about 4 months (around 06/22/2019).  Scarlette Calico, MD

## 2019-02-26 ENCOUNTER — Ambulatory Visit: Payer: Self-pay

## 2019-02-26 ENCOUNTER — Encounter: Payer: Self-pay | Admitting: Family Medicine

## 2019-02-26 ENCOUNTER — Other Ambulatory Visit: Payer: Self-pay

## 2019-02-26 ENCOUNTER — Ambulatory Visit (INDEPENDENT_AMBULATORY_CARE_PROVIDER_SITE_OTHER): Payer: 59 | Admitting: Family Medicine

## 2019-02-26 VITALS — BP 104/72 | HR 93 | Ht 64.0 in | Wt 186.0 lb

## 2019-02-26 DIAGNOSIS — M7062 Trochanteric bursitis, left hip: Secondary | ICD-10-CM

## 2019-02-26 DIAGNOSIS — M654 Radial styloid tenosynovitis [de Quervain]: Secondary | ICD-10-CM | POA: Diagnosis not present

## 2019-02-26 DIAGNOSIS — M25532 Pain in left wrist: Secondary | ICD-10-CM
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
## 2019-02-26 NOTE — Patient Instructions (Signed)
Good to see you  2nd time is a charm  Injected side of the hip as well  See me again in 6 weeks just in case

## 2019-02-26 NOTE — Assessment & Plan Note (Signed)
Patient was given another injection today, discussed icing regimen and home exercise, discussed considerations to avoid.  Discussed bracing again.  If continuing to have pain may need to consider the possibility of advanced imaging but I do not think it would be warranted.

## 2019-02-26 NOTE — Assessment & Plan Note (Signed)
Injection today and tolerated the procedure well.  Discussed icing regimen and home exercises, discussed topical anti-inflammatories.  Differential includes a lumbar radiculopathy and encouraged her to continue the gabapentin at night.  Worsening pain will need to consider the possibility of a nerve conduction study for surveillance imaging.  Patient should do well with conservative therapy.  Follow-up again in 6 weeks

## 2019-03-02 ENCOUNTER — Telehealth: Payer: Self-pay | Admitting: Internal Medicine

## 2019-03-02 ENCOUNTER — Other Ambulatory Visit: Payer: Self-pay | Admitting: Internal Medicine

## 2019-03-02 DIAGNOSIS — E118 Type 2 diabetes mellitus with unspecified complications: Secondary | ICD-10-CM

## 2019-03-02 DIAGNOSIS — I10 Essential (primary) hypertension: Secondary | ICD-10-CM

## 2019-03-02 NOTE — Telephone Encounter (Signed)
Pt needs a PA for VASCEPA 1 g CAPS / please advise

## 2019-03-02 NOTE — Telephone Encounter (Signed)
Key: [id number]

## 2019-03-03 ENCOUNTER — Other Ambulatory Visit: Payer: Self-pay | Admitting: Internal Medicine

## 2019-03-03 DIAGNOSIS — E785 Hyperlipidemia, unspecified: Secondary | ICD-10-CM

## 2019-04-06 ENCOUNTER — Ambulatory Visit: Payer: 59 | Admitting: Family Medicine

## 2019-04-13 ENCOUNTER — Other Ambulatory Visit: Payer: Self-pay | Admitting: Internal Medicine

## 2019-04-14 ENCOUNTER — Encounter: Payer: Self-pay | Admitting: Internal Medicine

## 2019-04-14 ENCOUNTER — Ambulatory Visit (INDEPENDENT_AMBULATORY_CARE_PROVIDER_SITE_OTHER): Payer: 59

## 2019-04-14 ENCOUNTER — Other Ambulatory Visit: Payer: Self-pay

## 2019-04-14 ENCOUNTER — Ambulatory Visit (INDEPENDENT_AMBULATORY_CARE_PROVIDER_SITE_OTHER): Payer: 59 | Admitting: Internal Medicine

## 2019-04-14 VITALS — BP 136/80 | HR 84 | Temp 98.4°F | Resp 16 | Ht 64.0 in | Wt 189.2 lb

## 2019-04-14 DIAGNOSIS — S90852A Superficial foreign body, left foot, initial encounter: Secondary | ICD-10-CM | POA: Diagnosis not present

## 2019-04-14 DIAGNOSIS — E118 Type 2 diabetes mellitus with unspecified complications: Secondary | ICD-10-CM | POA: Diagnosis not present

## 2019-04-14 DIAGNOSIS — Z23 Encounter for immunization: Secondary | ICD-10-CM | POA: Diagnosis not present

## 2019-04-14 DIAGNOSIS — I10 Essential (primary) hypertension: Secondary | ICD-10-CM | POA: Diagnosis not present

## 2019-04-14 MED ORDER — TRIAMTERENE-HCTZ 37.5-25 MG PO CAPS: 1.0000 | ORAL_CAPSULE | Freq: Every day | ORAL | 0 refills | Status: DC

## 2019-04-14 MED ORDER — CLONIDINE HCL 0.1 MG PO TABS: 0.2000 mg | ORAL_TABLET | Freq: Every day | ORAL | 3 refills | Status: DC

## 2019-04-14 MED ORDER — TELMISARTAN 80 MG PO TABS: 80.0000 mg | ORAL_TABLET | Freq: Every day | ORAL | 1 refills | Status: DC

## 2019-04-14 NOTE — Patient Instructions (Signed)
Hand or Foot Foreign Body, Adult  A foreign body is an object that gets into your body that should not be there. Your hands and feet are common entry points for foreign bodies. Common examples of foreign bodies include:  · Splinters.  · Thorns.  · Pieces of glass, wood, metal, or plastic.  · Metal objects such as needles, nails, and fishing hooks.  Foreign bodies that are not removed quickly may cause infection.  What are the causes?  A foreign body can get in your hand or foot through an injury, such as a scrape or cut, or if something punctures your skin.  What are the signs or symptoms?  Symptoms of having recently gotten a foreign body in your hand or foot include:  · Pain.  · Redness.  · Swelling.  · Tenderness.  · Numbness or tingling.  · Noticing something under the skin.  If the foreign body has caused an infection, symptoms may also include:  · Fever.  · Warmth in the area of the puncture.  · A lump that you can feel under the skin.  · Not being able to use your hand or put weight (bear weight) on your foot.  · Greenish or yellow fluid coming from the puncture area.  How is this diagnosed?  Your health care provider will diagnose a foreign body in the hand or foot by taking a medical history and examining your wound. If the foreign body is deep, you may also have tests, such as an X-ray, MRI, or ultrasound.  In some cases, your health care provider may need to numb the area, open it up to examine what is inside your skin, and remove the object.  How is this treated?  Your health care provider may be able to remove the foreign body while exploring your wound during the diagnosis. If the foreign body is deep or in a wound that is infected, you may need to have a procedure to remove it.  If an incision is needed, it may be closed with stitches (sutures), skin glue, or adhesive strips. A bandage (dressing) will be placed over the incision.  You may also need to get a tetanus shot or take antibiotic medicines to  prevent or treat an infection.  Follow these instructions at home:  Medicines  · Take over-the-counter and prescription medicines only as told by your health care provider.  · If you were prescribed an antibiotic medicine, take the antibiotic as told by your health care provider. Do not stop taking the antibiotic even if you start to feel better.  Wound care    · Change your dressing as told by your health care provider.  · Follow instructions from your health care provider about how to take care of your wound or incision. Make sure you:  ? Wash your hands with soap and water before you change your dressing. If soap and water are not available, use hand sanitizer.  ? Change your dressing as told by your health care provider.  ? Leave sutures, skin glue, or adhesive strips in place. These skin closures may need to stay in place for 2 weeks or longer. If adhesive strip edges start to loosen and curl up, you may trim the loose edges. Do not remove adhesive strips completely unless your health care provider tells you to do that.  · Check your puncture area or incision every day for signs of infection. Check for:  ? More redness, swelling, or pain.  ?   baths, swim, or use a hot tub until your health care provider approves. Ask your health care provider if you may take showers. You may only be allowed to take sponge baths.  Keep all follow-up visits as told by your health care provider. This is important. How is this prevented? To protect yourself:  Wear gloves when working with sharp objects.  Do not walk barefoot in areas where there may be sharp objects.  Wear thick-soled shoes or boots when walking in areas where there are sharp objects. Contact a health care provider if:  You  have a foreign body that has not come out or been removed.  You have more redness, swelling, or pain around the puncture area or incision.  You have more fluid or blood coming from the puncture area or incision.  Your puncture area or incision feels warm to the touch.  You have pus or a bad smell coming from the puncture area or incision.  You have a fever.  You were given a tetanus shot, and you have swelling, severe pain, redness, or bleeding at the injection site. Get help right away if:  Your puncture area becomes numb.  You cannot use your hand or foot. Summary  A foreign body is an object that gets into your body that should not be there. Your hands and feet are common entry points for foreign bodies.  Your health care provider may be able to remove the foreign body while exploring your wound during the diagnosis. If the foreign body is deep or in a wound that is infected, you may need to have a procedure to remove it.  You may also need to get a tetanus shot or take antibiotic medicines to prevent or treat an infection.  Check your puncture area or incision every day for signs of infection. This information is not intended to replace advice given to you by your health care provider. Make sure you discuss any questions you have with your health care provider. Document Released: 06/26/2016 Document Revised: 05/25/2018 Document Reviewed: 06/26/2016 Elsevier Patient Education  [employer].

## 2019-04-14 NOTE — Progress Notes (Signed)
Subjective:  Patient ID: Carolyn [name], female    DOB: 03-Aug-1960  Age: 58 y.o. MRN: [id number]  CC: Foreign Body   HPI Carolyn [name] presents for concerns about a foreign body in her left foot.  She noticed it over the last few days.  It is not bothering her with respect to pain, redness, swelling, or discharge.  Outpatient Medications Prior to Visit  Medication Sig Dispense Refill  . BD PEN NEEDLE MICRO U/F 32G X 6 MM MISC USE 1 DAILY 100 each 3  . blood glucose meter kit and supplies KIT Dispense based on patient and insurance preference. Use up to four times daily as directed. (FOR ICD-9 250.00, 250.01). 1 each 0  . Blood Glucose Monitoring Suppl (ONETOUCH VERIO IQ SYSTEM) W/DEVICE KIT 1 Act by Does not apply route 3 (three) times daily. 2 kit 0  . Diclofenac Sodium 2 % SOLN Place 2 g onto the skin 2 (two) times daily. 112 g 3  . EPINEPHrine 0.3 mg/0.3 mL IJ SOAJ injection INJECT 0.3 MLS INTO THE MUSCLE ONCE FOR 1 DOSE  0  . Fexofenadine HCl (ALLEGRA ALLERGY PO) Take by mouth.    . fluticasone (FLONASE) 50 MCG/ACT nasal spray Place 2 sprays into both nostrils daily. 16 g 6  . gabapentin (NEURONTIN) 100 MG capsule Take 2 capsules (200 mg total) by mouth at bedtime. 180 capsule 3  . glucose blood (ONE TOUCH ULTRA TEST) test strip Use to test blood sugar three times a week and prn if having symptoms of low blood sugar Dx: 250.92 90 day supply 100 each 11  . glucose blood (ONETOUCH VERIO) test strip Use TID 100 each 12  . hydrocortisone (ANUSOL-HC) 2.5 % rectal cream Place 1 application rectally 2 (two) times daily as needed for hemorrhoids. 30 g 1  . labetalol (NORMODYNE) 300 MG tablet Take 1 tablet (300 mg total) by mouth 2 (two) times daily. 180 tablet 2  . Lancets (ONETOUCH DELICA PLUS [id number]) MISC USE UP TO 4 TIMES DAILY AS DIRECTED  1  . omega-3 acid ethyl esters (LOVAZA) 1 g capsule Take 2 capsules (2 g total) by mouth 2 (two) times daily. 360 capsule 1  . omeprazole  (PRILOSEC) 40 MG capsule TAKE 1 CAPSULE BY MOUTH TWICE DAILY AT 8AM AND 10PM 60 capsule 5  . Semaglutide (RYBELSUS) 7 MG TABS Take 1 tablet by mouth daily. 90 tablet 1  . simvastatin (ZOCOR) 40 MG tablet TAKE 1 TABLET BY MOUTH AT BEDTIME 90 tablet 1  . cloNIDine (CATAPRES) 0.1 MG tablet Take 2 tablets by mouth once daily 60 tablet 3  . telmisartan (MICARDIS) 80 MG tablet Take 1 tablet by mouth once daily 90 tablet 1  . triamterene-hydrochlorothiazide (DYAZIDE) 37.5-25 MG capsule Take 1 each (1 capsule total) by mouth daily. 90 capsule 0   No facility-administered medications prior to visit.    ROS Review of Systems  Constitutional: Negative for chills, fatigue and fever.  HENT: Negative.   Eyes: Negative.   Respiratory: Negative for cough and shortness of breath.   Cardiovascular: Negative for chest pain, palpitations and leg swelling.  Gastrointestinal: Negative for abdominal pain, constipation, diarrhea and nausea.  Endocrine: Negative.   Genitourinary: Negative.  Negative for difficulty urinating.  Musculoskeletal: Negative.   Skin: Positive for wound. Negative for color change and pallor.  Neurological: Negative.  Negative for dizziness, weakness and light-headedness.  Hematological: Negative for adenopathy. Does not bruise/bleed easily.  Psychiatric/Behavioral: Negative.  All other systems reviewed and are negative.   Objective:  BP 136/80 (BP Location: Left Arm, Patient Position: Sitting, Cuff Size: Large)   Pulse 84   Temp 98.4 F (36.9 C) (Oral)   Resp 16   Ht 5' 4" (1.626 m)   Wt 189 lb 4 oz (85.8 kg)   SpO2 98%   BMI 32.48 kg/m   BP Readings from Last 3 Encounters:  04/14/19 136/80  02/26/19 104/72  02/22/19 (!) 142/86    Wt Readings from Last 3 Encounters:  04/14/19 189 lb 4 oz (85.8 kg)  02/26/19 186 lb (84.4 kg)  02/22/19 188 lb (85.3 kg)    Physical Exam Vitals reviewed.  Constitutional:      Appearance: Normal appearance.  HENT:     Nose: Nose  normal.     Mouth/Throat:     Pharynx: Oropharynx is clear.  Eyes:     General: No scleral icterus.    Conjunctiva/sclera: Conjunctivae normal.  Cardiovascular:     Rate and Rhythm: Normal rate and regular rhythm.     Heart sounds: No murmur.  Pulmonary:     Effort: Pulmonary effort is normal.     Breath sounds: No stridor. No wheezing, rhonchi or rales.  Abdominal:     General: Abdomen is protuberant. Bowel sounds are normal. There is no distension.     Palpations: Abdomen is soft. There is no hepatomegaly or splenomegaly.     Tenderness: There is no abdominal tenderness.  Musculoskeletal:        General: Normal range of motion.     Cervical back: Neck supple.       Feet:  Lymphadenopathy:     Cervical: No cervical adenopathy.  Skin:    General: Skin is warm and dry.  Neurological:     General: No focal deficit present.     Mental Status: She is alert.  Psychiatric:        Mood and Affect: Mood normal.        Behavior: Behavior normal.     Lab Results  Component Value Date   WBC 8.9 02/22/2019   HGB 10.6 (L) 02/22/2019   HCT 31.5 (L) 02/22/2019   PLT 312.0 02/22/2019   GLUCOSE 135 (H) 02/22/2019   CHOL 161 06/15/2018   TRIG 390.0 (H) 02/22/2019   HDL 34.40 (L) 06/15/2018   LDLDIRECT 81.0 06/15/2018   LDLCALC 68 07/30/2013   ALT 27 11/17/2018   AST 18 11/17/2018   NA 136 02/22/2019   K 3.9 02/22/2019   CL 101 02/22/2019   CREATININE 0.97 02/22/2019   BUN 18 02/22/2019   CO2 23 02/22/2019   TSH 1.989 07/10/2017   INR 1.04 01/14/2013   HGBA1C 6.9 (H) 02/22/2019   MICROALBUR 18.0 (H) 09/30/2018    DG Abd Acute W/Chest  Result Date: 02/22/2019 CLINICAL DATA:  Right flank pain EXAM: DG ABDOMEN ACUTE W/ 1V CHEST COMPARISON:  02/06/2018 FINDINGS: The heart size is enlarged. Bibasilar atelectasis is noted. There is no pneumothorax. No large pleural effusion. Aortic calcifications are noted. The bowel gas pattern is nonobstructive. No radiopaque calcifications are  noted. The patient is status post prior cholecystectomy. There is a prominent Riedel's lobe. IMPRESSION: Negative abdominal radiographs.  No acute cardiopulmonary disease. Electronically Signed   By: Constance Holster M.D.   On: 02/22/2019 15:27   DG HIP UNILAT WITH PELVIS 2-3 VIEWS LEFT  Result Date: 02/22/2019 CLINICAL DATA:  58 year old female with left hip pain. EXAM: DG HIP (WITH  OR WITHOUT PELVIS) 2-3V LEFT COMPARISON:  Abdominal radiograph dated 02/22/2019. FINDINGS: There is no acute fracture or dislocation. Mild arthritic changes with mild joint space narrowing and spurring. The soft tissues are unremarkable. IMPRESSION: No acute fracture or dislocation. Electronically Signed   By: Arash  Radparvar M.D.   On: 02/22/2019 15:26     Assessment & Plan:   Anevay was seen today for foreign body.  Diagnoses and all orders for this visit:  Foreign body in left foot, initial encounter- The foreign body was easily removed.  It is a small black metallic shard.  X-ray after removal of the foreign body shows no persistent foreign body.  The site does not show any evidence of infection or complications. -     DG Foot Complete Left; Future -     Tdap vaccine greater than or equal to 7yo IM  Type 2 diabetes mellitus with complication, without long-term current use of insulin (HCC) -     telmisartan (MICARDIS) 80 MG tablet; Take 1 tablet (80 mg total) by mouth daily.  Essential hypertension -     telmisartan (MICARDIS) 80 MG tablet; Take 1 tablet (80 mg total) by mouth daily. -     triamterene-hydrochlorothiazide (DYAZIDE) 37.5-25 MG capsule; Take 1 each (1 capsule total) by mouth daily. -     cloNIDine (CATAPRES) 0.1 MG tablet; Take 2 tablets (0.2 mg total) by mouth daily.   I have changed Jylian H. Granlund's telmisartan and cloNIDine. I am also having her maintain her glucose blood, glucose blood, OneTouch Verio IQ System, blood glucose meter kit and supplies, EPINEPHrine, OneTouch Delica Plus  Lancet33G, BD Pen Needle Micro U/F, hydrocortisone, Fexofenadine HCl (ALLEGRA ALLERGY PO), Diclofenac Sodium, gabapentin, labetalol, Rybelsus, omega-3 acid ethyl esters, omeprazole, fluticasone, simvastatin, and triamterene-hydrochlorothiazide.  Meds ordered this encounter  Medications  . telmisartan (MICARDIS) 80 MG tablet    Sig: Take 1 tablet (80 mg total) by mouth daily.    Dispense:  90 tablet    Refill:  1  . triamterene-hydrochlorothiazide (DYAZIDE) 37.5-25 MG capsule    Sig: Take 1 each (1 capsule total) by mouth daily.    Dispense:  90 capsule    Refill:  0  . cloNIDine (CATAPRES) 0.1 MG tablet    Sig: Take 2 tablets (0.2 mg total) by mouth daily.    Dispense:  60 tablet    Refill:  3     Follow-up: Return if symptoms worsen or fail to improve.   , MD 

## 2019-04-24 ENCOUNTER — Other Ambulatory Visit: Payer: Self-pay | Admitting: Internal Medicine

## 2019-04-24 DIAGNOSIS — E118 Type 2 diabetes mellitus with unspecified complications: Secondary | ICD-10-CM

## 2019-04-29 ENCOUNTER — Encounter: Payer: Self-pay | Admitting: Internal Medicine

## 2019-05-05 ENCOUNTER — Ambulatory Visit (INDEPENDENT_AMBULATORY_CARE_PROVIDER_SITE_OTHER): Payer: 59 | Admitting: Family Medicine

## 2019-05-05 ENCOUNTER — Encounter: Payer: Self-pay | Admitting: Family Medicine

## 2019-05-05 ENCOUNTER — Ambulatory Visit (INDEPENDENT_AMBULATORY_CARE_PROVIDER_SITE_OTHER): Payer: 59

## 2019-05-05 ENCOUNTER — Other Ambulatory Visit: Payer: Self-pay | Admitting: Family Medicine

## 2019-05-05 ENCOUNTER — Other Ambulatory Visit: Payer: Self-pay

## 2019-05-05 VITALS — BP 116/86 | HR 90 | Ht 64.0 in | Wt 190.0 lb

## 2019-05-05 DIAGNOSIS — M1710 Unilateral primary osteoarthritis, unspecified knee: Secondary | ICD-10-CM | POA: Diagnosis not present

## 2019-05-05 DIAGNOSIS — G8929 Other chronic pain: Secondary | ICD-10-CM

## 2019-05-05 DIAGNOSIS — M7062 Trochanteric bursitis, left hip: Secondary | ICD-10-CM | POA: Diagnosis not present

## 2019-05-05 DIAGNOSIS — M25562 Pain in left knee: Secondary | ICD-10-CM

## 2019-05-05 DIAGNOSIS — M654 Radial styloid tenosynovitis [de Quervain]: Secondary | ICD-10-CM

## 2019-05-05 DIAGNOSIS — M1711 Unilateral primary osteoarthritis, right knee: Secondary | ICD-10-CM | POA: Insufficient documentation
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
## 2019-05-05 NOTE — Assessment & Plan Note (Signed)
Significant improvement.  Follow-up again in 8 weeks if necessary

## 2019-05-05 NOTE — Patient Instructions (Addendum)
Xray today Exercises 3x a week See me again in 6-8 weeks

## 2019-05-05 NOTE — Assessment & Plan Note (Signed)
Nearly completely resolved at this time.  Discussed how to change and ergonomics with the potential for nonadjustable standing desk could be helpful for this as well as the hip

## 2019-05-05 NOTE — Assessment & Plan Note (Signed)
Bilateral mild arthritis noted.  Patient has had a history though of the left knee.  No instability at the moment but I do think of x-ray would be beneficial.  Home exercises given for vastus medialis oblique strengthening.  Follow-up with me again in 8 weeks and if any worsening pain consider injection

## 2019-06-22 ENCOUNTER — Ambulatory Visit: Payer: 59 | Admitting: Family Medicine

## 2019-06-23 ENCOUNTER — Other Ambulatory Visit: Payer: Self-pay | Admitting: Internal Medicine

## 2019-06-23 ENCOUNTER — Encounter: Payer: Self-pay | Admitting: Internal Medicine

## 2019-06-23 DIAGNOSIS — E118 Type 2 diabetes mellitus with unspecified complications: Secondary | ICD-10-CM

## 2019-07-05 ENCOUNTER — Ambulatory Visit (INDEPENDENT_AMBULATORY_CARE_PROVIDER_SITE_OTHER): Payer: 59

## 2019-07-05 ENCOUNTER — Encounter: Payer: Self-pay | Admitting: Family Medicine

## 2019-07-05 ENCOUNTER — Other Ambulatory Visit: Payer: Self-pay

## 2019-07-05 ENCOUNTER — Ambulatory Visit (INDEPENDENT_AMBULATORY_CARE_PROVIDER_SITE_OTHER): Payer: 59 | Admitting: Family Medicine

## 2019-07-05 VITALS — BP 130/90 | HR 99 | Ht 64.0 in | Wt 190.0 lb

## 2019-07-05 DIAGNOSIS — M7062 Trochanteric bursitis, left hip: Secondary | ICD-10-CM | POA: Diagnosis not present

## 2019-07-05 DIAGNOSIS — M25552 Pain in left hip: Secondary | ICD-10-CM

## 2019-07-05 NOTE — Patient Instructions (Addendum)
Injected hip today B complex Iron 65 mg Vit C 500mg  See me in 6-7 weeks

## 2019-07-05 NOTE — Assessment & Plan Note (Signed)
Chronic problem : Exacerbation  interventions previously, including medication management: Injections did help previously with baclofen.  Discussed topical anti-inflammatories and encouraged her to continue   Interventions this visit: Repeat injection given today.  Due to social determinants of health patient does not want to miss time or pay for physical therapy but could change our management. We discussed with patient the importance ergonomics, home exercises, icing regimen, and over-the-counter natural products.   Future considerations but will be based on evaluation and next visit: Encourage physical therapy or consider advanced imaging but likely not likely    Return to clinic: 6 weeks
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
## 2019-07-14 ENCOUNTER — Encounter: Payer: Self-pay | Admitting: Internal Medicine

## 2019-07-20 ENCOUNTER — Other Ambulatory Visit: Payer: Self-pay | Admitting: Internal Medicine

## 2019-07-20 DIAGNOSIS — I10 Essential (primary) hypertension: Secondary | ICD-10-CM

## 2019-07-22 ENCOUNTER — Other Ambulatory Visit: Payer: Self-pay | Admitting: Internal Medicine

## 2019-07-22 DIAGNOSIS — K5901 Slow transit constipation: Secondary | ICD-10-CM

## 2019-07-26 ENCOUNTER — Encounter: Payer: Self-pay | Admitting: Internal Medicine

## 2019-07-26 DIAGNOSIS — I10 Essential (primary) hypertension: Secondary | ICD-10-CM

## 2019-07-26 MED ORDER — TRIAMTERENE-HCTZ 37.5-25 MG PO CAPS: 1.0000 | ORAL_CAPSULE | Freq: Every day | ORAL | 1 refills | Status: DC

## 2019-07-28 ENCOUNTER — Other Ambulatory Visit (HOSPITAL_COMMUNITY): Payer: 59

## 2019-08-04 LAB — HM DIABETES EYE EXAM

## 2019-08-09 ENCOUNTER — Encounter: Payer: Self-pay | Admitting: Internal Medicine

## 2019-08-10 ENCOUNTER — Other Ambulatory Visit: Payer: Self-pay | Admitting: Internal Medicine

## 2019-08-10 DIAGNOSIS — K5904 Chronic idiopathic constipation: Secondary | ICD-10-CM

## 2019-08-10 DIAGNOSIS — E118 Type 2 diabetes mellitus with unspecified complications: Secondary | ICD-10-CM

## 2019-08-10 DIAGNOSIS — I10 Essential (primary) hypertension: Secondary | ICD-10-CM

## 2019-08-10 DIAGNOSIS — K5901 Slow transit constipation: Secondary | ICD-10-CM

## 2019-08-10 MED ORDER — LINACLOTIDE 145 MCG PO CAPS: 145.0000 ug | ORAL_CAPSULE | Freq: Every day | ORAL | 0 refills | Status: DC

## 2019-08-10 MED ORDER — TELMISARTAN 80 MG PO TABS: 80.0000 mg | ORAL_TABLET | Freq: Every day | ORAL | 0 refills | Status: DC

## 2019-08-12 ENCOUNTER — Ambulatory Visit (HOSPITAL_COMMUNITY): Payer: No Typology Code available for payment source | Attending: Internal Medicine

## 2019-08-12 ENCOUNTER — Other Ambulatory Visit: Payer: Self-pay

## 2019-08-12 DIAGNOSIS — I10 Essential (primary) hypertension: Secondary | ICD-10-CM | POA: Diagnosis not present

## 2019-08-12 DIAGNOSIS — I420 Dilated cardiomyopathy: Secondary | ICD-10-CM | POA: Insufficient documentation

## 2019-08-26 ENCOUNTER — Ambulatory Visit (INDEPENDENT_AMBULATORY_CARE_PROVIDER_SITE_OTHER): Payer: 59 | Admitting: Family Medicine

## 2019-08-26 ENCOUNTER — Ambulatory Visit (INDEPENDENT_AMBULATORY_CARE_PROVIDER_SITE_OTHER): Payer: 59

## 2019-08-26 ENCOUNTER — Encounter: Payer: Self-pay | Admitting: Family Medicine

## 2019-08-26 ENCOUNTER — Other Ambulatory Visit: Payer: Self-pay

## 2019-08-26 VITALS — BP 130/82 | HR 89 | Ht 64.0 in | Wt 189.0 lb

## 2019-08-26 DIAGNOSIS — M79671 Pain in right foot: Secondary | ICD-10-CM

## 2019-08-26 DIAGNOSIS — M7062 Trochanteric bursitis, left hip: Secondary | ICD-10-CM | POA: Diagnosis not present

## 2019-08-26 NOTE — Assessment & Plan Note (Signed)
Improvement after the injection.  Seems to be doing relatively well.  Discussed continuing home exercises.  Follow-up again in 4 to 8 weeks

## 2019-08-26 NOTE — Assessment & Plan Note (Signed)
Patient is having right foot pain.  Seems to be over the fifth metacarpal.  Discussed icing regimen and home exercise, discussed which activities to do which wants to avoid.  Increase activity slowly.  Discussed icing regimen.  Follow-up again in 4 to 8 weeks discussed rigid soled shoes.  X-rays pending
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
## 2019-08-26 NOTE — Patient Instructions (Addendum)
Good to see you Xray of foot today Pennsaid on the foot Rigid shoes for now even in the house  Ice 20 mins 2 times a day K2 200 mcg daily See me again in 5-6 weeks if not better

## 2019-09-01 ENCOUNTER — Other Ambulatory Visit: Payer: Self-pay | Admitting: Internal Medicine

## 2019-09-01 DIAGNOSIS — E785 Hyperlipidemia, unspecified: Secondary | ICD-10-CM

## 2019-09-09 ENCOUNTER — Other Ambulatory Visit: Payer: Self-pay

## 2019-09-09 ENCOUNTER — Encounter: Payer: Self-pay | Admitting: Internal Medicine

## 2019-09-09 ENCOUNTER — Ambulatory Visit (INDEPENDENT_AMBULATORY_CARE_PROVIDER_SITE_OTHER): Payer: 59 | Admitting: Internal Medicine

## 2019-09-09 VITALS — BP 144/82 | HR 94 | Temp 98.6°F | Ht 64.0 in | Wt 188.0 lb

## 2019-09-09 DIAGNOSIS — D538 Other specified nutritional anemias: Secondary | ICD-10-CM

## 2019-09-09 DIAGNOSIS — E785 Hyperlipidemia, unspecified: Secondary | ICD-10-CM | POA: Diagnosis not present

## 2019-09-09 DIAGNOSIS — E118 Type 2 diabetes mellitus with unspecified complications: Secondary | ICD-10-CM | POA: Diagnosis not present

## 2019-09-09 DIAGNOSIS — G43011 Migraine without aura, intractable, with status migrainosus: Secondary | ICD-10-CM | POA: Diagnosis not present

## 2019-09-09 DIAGNOSIS — I10 Essential (primary) hypertension: Secondary | ICD-10-CM

## 2019-09-09 DIAGNOSIS — Z0000 Encounter for general adult medical examination without abnormal findings: Secondary | ICD-10-CM | POA: Diagnosis not present

## 2019-09-09 DIAGNOSIS — E781 Pure hyperglyceridemia: Secondary | ICD-10-CM

## 2019-09-09 DIAGNOSIS — E519 Thiamine deficiency, unspecified: Secondary | ICD-10-CM | POA: Diagnosis not present

## 2019-09-09 LAB — HEPATIC FUNCTION PANEL
ALT: 23 U/L (ref 0–35)
AST: 17 U/L (ref 0–37)
Albumin: 4.8 g/dL (ref 3.5–5.2)
Alkaline Phosphatase: 71 U/L (ref 39–117)
Bilirubin, Direct: 0.1 mg/dL (ref 0.0–0.3)
Total Bilirubin: 0.4 mg/dL (ref 0.2–1.2)
Total Protein: 7.8 g/dL (ref 6.0–8.3)

## 2019-09-09 LAB — CBC WITH DIFFERENTIAL/PLATELET
Basophils Absolute: 0.1 10*3/uL (ref 0.0–0.1)
Basophils Relative: 0.7 % (ref 0.0–3.0)
Eosinophils Absolute: 0.2 10*3/uL (ref 0.0–0.7)
Eosinophils Relative: 1.8 % (ref 0.0–5.0)
HCT: 30 % — ABNORMAL LOW (ref 36.0–46.0)
Hemoglobin: 10.2 g/dL — ABNORMAL LOW (ref 12.0–15.0)
Lymphocytes Relative: 32 % (ref 12.0–46.0)
Lymphs Abs: 3 10*3/uL (ref 0.7–4.0)
MCHC: 34.1 g/dL (ref 30.0–36.0)
MCV: 86.6 fl (ref 78.0–100.0)
Monocytes Absolute: 0.6 10*3/uL (ref 0.1–1.0)
Monocytes Relative: 6.8 % (ref 3.0–12.0)
Neutro Abs: 5.4 10*3/uL (ref 1.4–7.7)
Neutrophils Relative %: 58.7 % (ref 43.0–77.0)
Platelets: 338 10*3/uL (ref 150.0–400.0)
RBC: 3.46 Mil/uL — ABNORMAL LOW (ref 3.87–5.11)
RDW: 14 % (ref 11.5–15.5)
WBC: 9.2 10*3/uL (ref 4.0–10.5)

## 2019-09-09 LAB — BASIC METABOLIC PANEL
BUN: 19 mg/dL (ref 6–23)
CO2: 26 mEq/L (ref 19–32)
Calcium: 9.6 mg/dL (ref 8.4–10.5)
Chloride: 101 mEq/L (ref 96–112)
Creatinine, Ser: 1.12 mg/dL (ref 0.40–1.20)
GFR: 60.55 mL/min (ref >60.00)
Glucose, Bld: 98 mg/dL (ref 70–99)
Potassium: 3.5 mEq/L (ref 3.5–5.1)
Sodium: 136 mEq/L (ref 135–145)

## 2019-09-09 LAB — URINALYSIS, ROUTINE W REFLEX MICROSCOPIC
Bilirubin Urine: NEGATIVE
Hgb urine dipstick: NEGATIVE
Ketones, ur: NEGATIVE
Leukocytes,Ua: NEGATIVE
Nitrite: NEGATIVE
Specific Gravity, Urine: 1.015 (ref 1.000–1.030)
Total Protein, Urine: 30 — AB
Urine Glucose: NEGATIVE
Urobilinogen, UA: 0.2 (ref 0.0–1.0)
pH: 6 (ref 5.0–8.0)

## 2019-09-09 LAB — LIPID PANEL
Cholesterol: 169 mg/dL (ref 0–200)
HDL: 32.1 mg/dL — ABNORMAL LOW (ref >39.00)
NonHDL: 137.06
Total CHOL/HDL Ratio: 5
Triglycerides: 396 mg/dL — ABNORMAL HIGH (ref 0.0–149.0)
VLDL: 79.2 mg/dL — ABNORMAL HIGH (ref 0.0–40.0)

## 2019-09-09 LAB — TSH: TSH: 1.73 u[IU]/mL (ref 0.35–4.50)

## 2019-09-09 LAB — SEDIMENTATION RATE: Sed Rate: 48 mm/hr — ABNORMAL HIGH (ref 0–30)

## 2019-09-09 LAB — MICROALBUMIN / CREATININE URINE RATIO
Creatinine,U: 121.8 mg/dL
Microalb Creat Ratio: 20.4 mg/g (ref 0.0–30.0)
Microalb, Ur: 24.9 mg/dL — ABNORMAL HIGH (ref 0.0–1.9)

## 2019-09-09 LAB — HEMOGLOBIN A1C: Hgb A1c MFr Bld: 6.7 % — ABNORMAL HIGH (ref 4.6–6.5)

## 2019-09-09 LAB — LDL CHOLESTEROL, DIRECT: Direct LDL: 72 mg/dL

## 2019-09-09 MED ORDER — RYBELSUS 14 MG PO TABS: 1.0000 | ORAL_TABLET | Freq: Every day | ORAL | 1 refills | Status: DC

## 2019-09-09 MED ORDER — NURTEC 75 MG PO TBDP: 1.0000 | ORAL_TABLET | Freq: Every day | ORAL | 0 refills | Status: DC | PRN

## 2019-09-09 MED ORDER — OMEGA-3-ACID ETHYL ESTERS 1 G PO CAPS: 2.0000 g | ORAL_CAPSULE | Freq: Two times a day (BID) | ORAL | 1 refills | Status: DC

## 2019-09-09 MED ORDER — CLONIDINE 0.1 MG/24HR TD PTWK: 0.1000 mg | MEDICATED_PATCH | TRANSDERMAL | 0 refills | Status: DC

## 2019-09-09 NOTE — Progress Notes (Signed)
Subjective:  Patient ID: Carolyn [name], female    DOB: 07/13/1960  Age: 59 y.o. MRN: [id number]  CC: Hypertension, Headache, Diabetes, and Annual Exam  This visit occurred during the SARS-CoV-2 public health emergency.  Safety protocols were in place, including screening questions prior to the visit, additional usage of staff PPE, and extensive cleaning of exam room while observing appropriate contact time as indicated for disinfecting solutions.    HPI Carolyn [name] presents for a CPX.  She complains of a 2-week history of headache.  She describes it as a pounding sensation that radiates from her neck up into the frontal area.  This is not the worst headache she has ever had.  She has had headaches like this before but this one lasted longer.  She has not gotten much symptom relief with Aleve, Excedrin, or Tylenol.  She denies changes in her vision or hearing.  She denies paresthesias.  She denies nausea, vomiting, ataxia, or slurred speech.  She takes about 10,000 steps per day.  She denies CP, DOE, palpitations, or fatigue.  She is concerned that she has not been able to lose weight and complains of persistent lower extremity edema.  Outpatient Medications Prior to Visit  Medication Sig Dispense Refill  . BD PEN NEEDLE MICRO U/F 32G X 6 MM MISC USE 1 DAILY 100 each 3  . blood glucose meter kit and supplies KIT Dispense based on patient and insurance preference. Use up to four times daily as directed. (FOR ICD-9 250.00, 250.01). 1 each 0  . Blood Glucose Monitoring Suppl (ONETOUCH VERIO IQ SYSTEM) W/DEVICE KIT 1 Act by Does not apply route 3 (three) times daily. 2 kit 0  . Diclofenac Sodium 2 % SOLN Place 2 g onto the skin 2 (two) times daily. 112 g 3  . EPINEPHrine 0.3 mg/0.3 mL IJ SOAJ injection INJECT 0.3 MLS INTO THE MUSCLE ONCE FOR 1 DOSE  0  . Fexofenadine HCl (ALLEGRA ALLERGY PO) Take by mouth.    . fluticasone (FLONASE) 50 MCG/ACT nasal spray Place 2 sprays into both nostrils  daily. 16 g 6  . gabapentin (NEURONTIN) 100 MG capsule Take 2 capsules (200 mg total) by mouth at bedtime. 180 capsule 3  . glucose blood (ONE TOUCH ULTRA TEST) test strip Use to test blood sugar three times a week and prn if having symptoms of low blood sugar Dx: 250.92 90 day supply 100 each 11  . glucose blood (ONETOUCH VERIO) test strip Use TID 100 each 12  . labetalol (NORMODYNE) 300 MG tablet Take 1 tablet (300 mg total) by mouth 2 (two) times daily. 180 tablet 2  . Lancets (ONETOUCH DELICA PLUS [id number]) MISC USE UP TO 4 TIMES DAILY AS DIRECTED  1  . linaclotide (LINZESS) 145 MCG CAPS capsule Take 1 capsule (145 mcg total) by mouth daily before breakfast. 90 capsule 0  . omeprazole (PRILOSEC) 40 MG capsule TAKE 1 CAPSULE BY MOUTH TWICE DAILY AT 8 AM AND 10PM 180 capsule 0  . simvastatin (ZOCOR) 40 MG tablet TAKE 1 TABLET BY MOUTH AT BEDTIME 90 tablet 0  . telmisartan (MICARDIS) 80 MG tablet Take 1 tablet (80 mg total) by mouth daily. 90 tablet 0  . triamterene-hydrochlorothiazide (DYAZIDE) 37.5-25 MG capsule Take 1 each (1 capsule total) by mouth daily. 90 capsule 1  . cloNIDine (CATAPRES) 0.1 MG tablet Take 2 tablets by mouth once daily 180 tablet 0  . hydrocortisone (ANUSOL-HC) 2.5 % rectal cream Place 1 application rectally  2 (two) times daily as needed for hemorrhoids. 30 g 1  . omega-3 acid ethyl esters (LOVAZA) 1 g capsule Take 2 capsules (2 g total) by mouth 2 (two) times daily. 360 capsule 1  . RYBELSUS 7 MG TABS Take 1 tablet by mouth once daily 90 tablet 0   No facility-administered medications prior to visit.    ROS Review of Systems  Constitutional: Negative for appetite change, chills, diaphoresis, fatigue and unexpected weight change.  HENT: Negative.  Negative for trouble swallowing and voice change.   Eyes: Negative for pain and visual disturbance.  Respiratory: Negative for cough, chest tightness, shortness of breath and wheezing.   Cardiovascular: Positive for leg  swelling. Negative for chest pain and palpitations.  Gastrointestinal: Negative for abdominal pain, diarrhea, nausea and vomiting.  Endocrine: Negative.   Genitourinary: Negative.  Negative for difficulty urinating, dysuria and hematuria.  Musculoskeletal: Negative for arthralgias, back pain, myalgias and neck pain.  Skin: Negative for color change, pallor and rash.  Neurological: Positive for headaches. Negative for syncope, facial asymmetry, weakness and light-headedness.  Hematological: Negative for adenopathy. Does not bruise/bleed easily.  Psychiatric/Behavioral: Negative.     Objective:  BP (!) 144/82 (BP Location: Left Arm, Patient Position: Sitting, Cuff Size: Large)   Pulse 94   Temp 98.6 F (37 C) (Oral)   Ht '5\' 4"'  (1.626 m)   Wt 188 lb (85.3 kg)   SpO2 98%   BMI 32.27 kg/m   BP Readings from Last 3 Encounters:  09/09/19 (!) 144/82  08/26/19 130/82  07/05/19 130/90    Wt Readings from Last 3 Encounters:  09/09/19 188 lb (85.3 kg)  08/26/19 189 lb (85.7 kg)  07/05/19 190 lb (86.2 kg)    Physical Exam Vitals reviewed.  Constitutional:      General: She is not in acute distress.    Appearance: She is well-developed. She is not ill-appearing, toxic-appearing or diaphoretic.  HENT:     Head: Normocephalic.     Mouth/Throat:     Mouth: Mucous membranes are moist.  Eyes:     General: No scleral icterus.    Extraocular Movements: Extraocular movements intact.     Right eye: Normal extraocular motion and no nystagmus.     Left eye: Normal extraocular motion and no nystagmus.     Conjunctiva/sclera: Conjunctivae normal.     Pupils: Pupils are equal, round, and reactive to light.  Neck:     Meningeal: Brudzinski's sign and Kernig's sign absent.  Cardiovascular:     Rate and Rhythm: Normal rate and regular rhythm.     Heart sounds: No murmur.  Pulmonary:     Effort: Pulmonary effort is normal.     Breath sounds: No stridor. No wheezing, rhonchi or rales.    Abdominal:     General: Abdomen is protuberant. Bowel sounds are normal. There is no distension.     Palpations: Abdomen is soft. There is no hepatomegaly, splenomegaly or mass.     Tenderness: There is no abdominal tenderness.  Musculoskeletal:        General: No swelling. Normal range of motion.     Cervical back: Normal range of motion. No rigidity or tenderness.     Right lower leg: Edema (trace) present.     Left lower leg: Edema (trace) present.  Lymphadenopathy:     Cervical: No cervical adenopathy.  Skin:    General: Skin is warm and dry.     Coloration: Skin is not pale.  Neurological:  General: No focal deficit present.     Mental Status: She is alert and oriented to person, place, and time. Mental status is at baseline.  Psychiatric:        Mood and Affect: Mood normal.        Behavior: Behavior normal.        Thought Content: Thought content normal.        Judgment: Judgment normal.     Lab Results  Component Value Date   WBC 9.2 09/09/2019   HGB 10.2 (L) 09/09/2019   HCT 30.0 (L) 09/09/2019   PLT 338.0 09/09/2019   GLUCOSE 98 09/09/2019   CHOL 169 09/09/2019   TRIG 396.0 (H) 09/09/2019   HDL 32.10 (L) 09/09/2019   LDLDIRECT 72.0 09/09/2019   LDLCALC 68 07/30/2013   ALT 23 09/09/2019   AST 17 09/09/2019   NA 136 09/09/2019   K 3.5 09/09/2019   CL 101 09/09/2019   CREATININE 1.12 09/09/2019   BUN 19 09/09/2019   CO2 26 09/09/2019   TSH 1.73 09/09/2019   INR 1.04 01/14/2013   HGBA1C 6.7 (H) 09/09/2019   MICROALBUR 24.9 (H) 09/09/2019    DG Abd Acute W/Chest  Result Date: 02/22/2019 CLINICAL DATA:  Right flank pain EXAM: DG ABDOMEN ACUTE W/ 1V CHEST COMPARISON:  02/06/2018 FINDINGS: The heart size is enlarged. Bibasilar atelectasis is noted. There is no pneumothorax. No large pleural effusion. Aortic calcifications are noted. The bowel gas pattern is nonobstructive. No radiopaque calcifications are noted. The patient is status post prior  cholecystectomy. There is a prominent Riedel's lobe. IMPRESSION: Negative abdominal radiographs.  No acute cardiopulmonary disease. Electronically Signed   By: Constance Holster M.D.   On: 02/22/2019 15:27   DG HIP UNILAT WITH PELVIS 2-3 VIEWS LEFT  Result Date: 02/22/2019 CLINICAL DATA:  59 year old female with left hip pain. EXAM: DG HIP (WITH OR WITHOUT PELVIS) 2-3V LEFT COMPARISON:  Abdominal radiograph dated 02/22/2019. FINDINGS: There is no acute fracture or dislocation. Mild arthritic changes with mild joint space narrowing and spurring. The soft tissues are unremarkable. IMPRESSION: No acute fracture or dislocation. Electronically Signed   By: Anner Crete M.D.   On: 02/22/2019 15:26    Assessment & Plan:   Zanyia was seen today for hypertension, headache, diabetes and annual exam.  Diagnoses and all orders for this visit:  Essential hypertension- Her blood pressure is not adequately well controlled.  I recommended she change from oral clonidine to a clonidine patch.  She will continue the other antihypertensives and agrees to improve her lifestyle modifications. -     TSH; Future -     Urinalysis, Routine w reflex microscopic; Future -     Basic metabolic panel; Future -     CBC with Differential/Platelet; Future -     cloNIDine (CATAPRES - DOSED IN MG/24 HR) 0.1 mg/24hr patch; Place 1 patch (0.1 mg total) onto the skin once a week. -     CBC with Differential/Platelet -     Basic metabolic panel -     Urinalysis, Routine w reflex microscopic -     TSH  Anemia due to acquired thiamine deficiency- She remains anemic.  I will monitor her thiamine level. -     Vitamin B1; Future -     CBC with Differential/Platelet; Future -     CBC with Differential/Platelet -     Vitamin B1  Hyperlipidemia with target LDL less than 100- She has achieved her LDL goal and is  doing well on the statin. -     TSH; Future -     Hepatic function panel; Future -     Hepatic function panel -      TSH  Routine general medical examination at a health care facility- Exam completed, labs reviewed, vaccines reviewed and updated, cancer screenings are up-to-date, patient education was given. -     Lipid panel; Future -     Lipid panel  Type 2 diabetes mellitus with complication, without long-term current use of insulin ([place])- Her A1c is adequately well controlled at 6.7% but she has not had much success with weight loss.  I recommended that she increase the dose of the GLP-1 agonist. -     Semaglutide (RYBELSUS) 14 MG TABS; Take 1 tablet by mouth daily. -     Hemoglobin A1c; Future -     Basic metabolic panel; Future -     Microalbumin / creatinine urine ratio; Future -     Microalbumin / creatinine urine ratio -     Basic metabolic panel -     Hemoglobin A1c  Intractable migraine without aura and with status migrainosus- Her neuro exam is reassuring.  She does not have any alarming symptoms or features.  Her labs are reassuring.  I recommended that she try a CGRP antagonist for this. -     Rimegepant Sulfate (NURTEC) 75 MG TBDP; Take 1 tablet by mouth daily as needed. -     Sedimentation rate; Future -     Sedimentation rate  Hypertriglyceridemia- Her triglycerides remain elevated.  I recommended that she improve her lifestyle modifications and to be more compliant with the omega-3 fish oil supplement. -     omega-3 acid ethyl esters (LOVAZA) 1 g capsule; Take 2 capsules (2 g total) by mouth 2 (two) times daily.  Other orders -     LDL cholesterol, direct   I have discontinued Tanica H. Connery's hydrocortisone, Rybelsus, and cloNIDine. I am also having her start on Rybelsus, Nurtec, and cloNIDine. Additionally, I am having her maintain her glucose blood, glucose blood, OneTouch Verio IQ System, blood glucose meter kit and supplies, EPINEPHrine, OneTouch Delica Plus [id number], BD Pen Needle Micro U/F, Fexofenadine HCl (ALLEGRA ALLERGY PO), Diclofenac Sodium, gabapentin, labetalol,  fluticasone, omeprazole, triamterene-hydrochlorothiazide, telmisartan, linaclotide, simvastatin, and omega-3 acid ethyl esters.  Meds ordered this encounter  Medications  . Semaglutide (RYBELSUS) 14 MG TABS    Sig: Take 1 tablet by mouth daily.    Dispense:  90 tablet    Refill:  1  . Rimegepant Sulfate (NURTEC) 75 MG TBDP    Sig: Take 1 tablet by mouth daily as needed.    Dispense:  8 tablet    Refill:  0  . cloNIDine (CATAPRES - DOSED IN MG/24 HR) 0.1 mg/24hr patch    Sig: Place 1 patch (0.1 mg total) onto the skin once a week.    Dispense:  12 patch    Refill:  0  . omega-3 acid ethyl esters (LOVAZA) 1 g capsule    Sig: Take 2 capsules (2 g total) by mouth 2 (two) times daily.    Dispense:  360 capsule    Refill:  1   In addition to time spent on CPE, I spent 50 minutes in preparing to see the patient by review of recent labs, imaging and procedures, obtaining and reviewing separately obtained history, communicating with the patient and family or caregiver, ordering medications, tests or procedures, and documenting clinical  information in the EHR including the differential Dx, treatment, and any further evaluation and other management of 1. Essential hypertension 2. Anemia due to acquired thiamine deficiency 3. Hyperlipidemia with target LDL less than 100 4. Type 2 diabetes mellitus with complication, without long-term current use of insulin ([place]) 5. Intractable migraine without aura and with status migrainosus 6. Hypertriglyceridemia     Follow-up: Return in about 3 weeks (around 09/30/2019).  Scarlette Calico, MD

## 2019-09-09 NOTE — Patient Instructions (Signed)

## 2019-09-14 ENCOUNTER — Encounter: Payer: Self-pay | Admitting: Internal Medicine

## 2019-09-14 LAB — VITAMIN B1: Vitamin B1 (Thiamine): 16 nmol/L (ref 8–30)

## 2019-09-17 ENCOUNTER — Telehealth: Payer: Self-pay

## 2019-09-17 NOTE — Telephone Encounter (Signed)
Patient states she was able to pick up medication and it had been approved.

## 2019-09-17 NOTE — Telephone Encounter (Signed)
(  Key: [id number])

## 2019-09-23 ENCOUNTER — Encounter: Payer: Self-pay | Admitting: Internal Medicine

## 2019-09-23 ENCOUNTER — Other Ambulatory Visit: Payer: Self-pay | Admitting: Cardiovascular Disease

## 2019-09-23 MED ORDER — LABETALOL HCL 300 MG PO TABS: 300.0000 mg | ORAL_TABLET | Freq: Two times a day (BID) | ORAL | 0 refills | Status: DC

## 2019-09-24 ENCOUNTER — Telehealth (INDEPENDENT_AMBULATORY_CARE_PROVIDER_SITE_OTHER): Payer: No Typology Code available for payment source | Admitting: Family

## 2019-09-24 DIAGNOSIS — I10 Essential (primary) hypertension: Secondary | ICD-10-CM | POA: Diagnosis not present

## 2019-09-24 DIAGNOSIS — B379 Candidiasis, unspecified: Secondary | ICD-10-CM

## 2019-09-24 DIAGNOSIS — J0191 Acute recurrent sinusitis, unspecified: Secondary | ICD-10-CM

## 2019-09-24 DIAGNOSIS — R519 Headache, unspecified: Secondary | ICD-10-CM

## 2019-09-24 MED ORDER — AMOXICILLIN-POT CLAVULANATE 875-125 MG PO TABS: 1.0000 | ORAL_TABLET | Freq: Two times a day (BID) | ORAL | 0 refills | Status: AC

## 2019-09-24 MED ORDER — FLUTICASONE PROPIONATE 50 MCG/ACT NA SUSP: 2.0000 | Freq: Every day | NASAL | 6 refills | Status: DC

## 2019-09-24 MED ORDER — FLUCONAZOLE 150 MG PO TABS
ORAL_TABLET | ORAL | 0 refills | Status: DC
Start: 2019-09-24 — End: 2019-11-08

## 2019-09-24 NOTE — Progress Notes (Signed)
Carolyn [name] is a 59 y.o. female with the following history as recorded in [employer]:  Patient Active Problem List   Diagnosis Date Noted  . Intractable migraine without aura and with status migrainosus 09/09/2019  . Patellofemoral arthritis 05/05/2019  . Type 2 diabetes mellitus with complication, without long-term current use of insulin ([place]) 04/14/2019  . Greater trochanteric bursitis of left hip 02/22/2019  . De Quervain's tenosynovitis, left 07/01/2018  . Normocytic anemia 10/22/2017  . Anemia in other chronic diseases classified elsewhere 07/29/2017  . 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 Slow transit constipation 10/19/2015  . Hypertriglyceridemia 05/25/2015  . Routine general medical examination at a health care facility 05/25/2015  . Visit for screening mammogram 10/27/2014  . Left lumbar radiculitis 10/23/2013  . Obesity (BMI 30-39.9) 09/20/2013  . Decreased cardiac ejection fraction 09/17/2013  . Snoring 07/30/2013  . Type II diabetes mellitus with manifestations ([place]) 12/02/2012  . GERD (gastroesophageal reflux disease)   . Hyperlipidemia with target LDL less than 100   . Allergic rhinitis 07/22/2010  . Essential hypertension 05/25/2010    Current Outpatient Medications  Medication Sig Dispense Refill  . BD PEN NEEDLE MICRO U/F 32G X 6 MM MISC USE 1 DAILY 100 each 3  . blood glucose meter kit and supplies KIT Dispense based on patient and insurance preference. Use up to four times daily as directed. (FOR ICD-9 250.00, 250.01). 1 each 0  . Blood Glucose Monitoring Suppl (ONETOUCH VERIO IQ SYSTEM) W/DEVICE KIT 1 Act by Does not apply route 3 (three) times daily. 2 kit 0  . cloNIDine (CATAPRES - DOSED IN MG/24 HR) 0.1 mg/24hr patch Place 1 patch (0.1 mg total) onto the skin once a week. 12 patch 0  . EPINEPHrine 0.3 mg/0.3 mL IJ SOAJ injection INJECT 0.3 MLS INTO THE MUSCLE ONCE FOR 1 DOSE  0   . Fexofenadine HCl (ALLEGRA ALLERGY PO) Take by mouth.    . fluticasone (FLONASE) 50 MCG/ACT nasal spray Place 2 sprays into both nostrils daily. 16 g 6  . gabapentin (NEURONTIN) 100 MG capsule Take 2 capsules (200 mg total) by mouth at bedtime. 180 capsule 3  . glucose blood (ONE TOUCH ULTRA TEST) test strip Use to test blood sugar three times a week and prn if having symptoms of low blood sugar Dx: 250.92 90 day supply 100 each 11  . glucose blood (ONETOUCH VERIO) test strip Use TID 100 each 12  . labetalol (NORMODYNE) 300 MG tablet Take 1 tablet (300 mg total) by mouth 2 (two) times daily. Please keep upcoming appt with Dr. Johnsie Cancel in June before anymore refills. Thank you 180 tablet 0  . linaclotide (LINZESS) 145 MCG CAPS capsule Take 1 capsule (145 mcg total) by mouth daily before breakfast. 90 capsule 0  . omeprazole (PRILOSEC) 40 MG capsule TAKE 1 CAPSULE BY MOUTH TWICE DAILY AT 8 AM AND 10PM 180 capsule 0  . Rimegepant Sulfate (NURTEC) 75 MG TBDP Take 1 tablet by mouth daily as needed. 8 tablet 0  . Semaglutide (RYBELSUS) 14 MG TABS Take 1 tablet by mouth daily. 90 tablet 1  . simvastatin (ZOCOR) 40 MG tablet TAKE 1 TABLET BY MOUTH AT BEDTIME 90 tablet 0  . telmisartan (MICARDIS) 80 MG tablet Take 1 tablet (80 mg total) by mouth daily. 90 tablet 0  . triamterene-hydrochlorothiazide (DYAZIDE) 37.5-25 MG capsule Take 1 each (1 capsule total) by mouth daily. 90 capsule 1  .  amoxicillin-clavulanate (AUGMENTIN) 875-125 MG tablet Take 1 tablet by mouth 2 (two) times daily for 10 days. 20 tablet 0  . Diclofenac Sodium 2 % SOLN Place 2 g onto the skin 2 (two) times daily. 112 g 3  . fluconazole (DIFLUCAN) 150 MG tablet Take 1 tablet as directed and then repeat after 72 hours; 2 tablet 0  . Lancets (ONETOUCH DELICA PLUS [id number]) MISC USE UP TO 4 TIMES DAILY AS DIRECTED  1  . omega-3 acid ethyl esters (LOVAZA) 1 g capsule Take 2 capsules (2 g total) by mouth 2 (two) times daily. 360 capsule 1    No current facility-administered medications for this visit.    Allergies: Invokana [canagliflozin], Codeine, Flexeril [cyclobenzaprine], Morphine and related, Reglan [metoclopramide], and Silicon  Past Medical History:  Diagnosis Date  . Allergy    seasonal  . Anemia    Anemia of chronic disease suspected  . Anxiety attack   . Arthritis   . Benign fundic gland polyps of stomach   . Dyslipidemia   . Exertional shortness of breath   . GERD (gastroesophageal reflux disease)   . Heart murmur   . Hyperlipidemia   . Hypertension   . Migraines    "probably weekly" (01/14/2013)  . Type II diabetes mellitus ([place])     Past Surgical History:  Procedure Laterality Date  . CHOLECYSTECTOMY  1990  . COLONOSCOPY    . ENDOMETRIAL ABLATION  ~ 2000-2002   "twice" (01/14/2013)  . LEFT HEART CATHETERIZATION WITH CORONARY ANGIOGRAM N/A 01/15/2013   Procedure: LEFT HEART CATHETERIZATION WITH CORONARY ANGIOGRAM;  Surgeon: Blane Ohara, MD;  Location: [facility] CATH LAB;  Service: Cardiovascular;  Laterality: N/A;  . LEFT OOPHORECTOMY Left ~ 2003  . ORIF TIBIA & FIBULA FRACTURES Left 2003  . VAGINAL HYSTERECTOMY  ~ 2003    Family History  Problem Relation Age of Onset  . Alzheimer's disease Mother   . Hypertension Mother   . Emphysema Father   . Thyroid cancer Sister   . Cancer Sister        Breast Cancer  . Diabetes Sister   . Diabetes Sister   . Emphysema Sister   . Diabetes Brother   . Colon cancer Neg Hx     Social History   Tobacco Use  . Smoking status: Never Smoker  . Smokeless tobacco: Never Used  Substance Use Topics  . Alcohol use: No    Alcohol/week: 0.0 standard drinks    Comment: socially    Subjective:   I connected with Carolyn [name] on 09/24/19 at  9:20 AM EDT by a video enabled telemedicine application and verified that I am speaking with the correct person using two identifiers.   I discussed the limitations of evaluation and management by telemedicine and the  availability of in person appointments. The patient expressed understanding and agreed to proceed. Provider in office/ patient is at home; provider and patient are only 2 people on video call.   Has been having headaches/ facial pain/ burning x 1 week; feels like a sinus infection; is prone to recurrent sinus infections; + teeth pain; + eyes "are burning." using OTC Sudafed;  Notes that new blood pressure regimen appears to be working well- had pressure checked with GYN earlier this week and was 124/82;      Objective:  There were no vitals filed for this visit.  General: Well developed, well nourished, in no acute distress  Skin : Warm and dry.  Head: Normocephalic and  atraumatic  Lungs: Respirations unlabored;  Neurologic: Alert and oriented; speech intact; face symmetrical; moves all extremities well; CNII-XII intact without focal deficit   Assessment:  1. Acute recurrent sinusitis, unspecified location   2. Nonintractable headache, unspecified chronicity pattern, unspecified headache type   3. Essential hypertension   4. Yeast infection     Plan:  Rx for Augmentin 875 mg bid x 10 days; continue Flonase and Claritin; if headache persists after taking medication, needs in office appointment; Blood pressure appears to be responding well to treatment regimen; Rx for Diflucan given due to recurrent yeast infections;   No follow-ups on file.  No orders of the defined types were placed in this encounter.   Requested Prescriptions   Signed Prescriptions Disp Refills  . amoxicillin-clavulanate (AUGMENTIN) 875-125 MG tablet 20 tablet 0    Sig: Take 1 tablet by mouth 2 (two) times daily for 10 days.  . fluticasone (FLONASE) 50 MCG/ACT nasal spray 16 g 6    Sig: Place 2 sprays into both nostrils daily.  . fluconazole (DIFLUCAN) 150 MG tablet 2 tablet 0    Sig: Take 1 tablet as directed and then repeat after 72 hours;

## 2019-09-25 ENCOUNTER — Encounter: Payer: Self-pay | Admitting: Internal Medicine

## 2019-09-25 DIAGNOSIS — I10 Essential (primary) hypertension: Secondary | ICD-10-CM

## 2019-09-25 DIAGNOSIS — E118 Type 2 diabetes mellitus with unspecified complications: Secondary | ICD-10-CM

## 2019-09-27 MED ORDER — TELMISARTAN 80 MG PO TABS: 80.0000 mg | ORAL_TABLET | Freq: Every day | ORAL | 0 refills | Status: DC

## 2019-09-30 ENCOUNTER — Encounter: Payer: Self-pay | Admitting: Family Medicine

## 2019-09-30 ENCOUNTER — Ambulatory Visit: Payer: No Typology Code available for payment source | Admitting: Family Medicine

## 2019-09-30 ENCOUNTER — Other Ambulatory Visit: Payer: Self-pay

## 2019-09-30 VITALS — BP 120/74 | HR 92 | Ht 64.0 in | Wt 188.0 lb

## 2019-09-30 DIAGNOSIS — M25561 Pain in right knee: Secondary | ICD-10-CM | POA: Diagnosis not present

## 2019-09-30 DIAGNOSIS — M1711 Unilateral primary osteoarthritis, right knee: Secondary | ICD-10-CM | POA: Diagnosis not present

## 2019-09-30 NOTE — Patient Instructions (Signed)
See me in 7-8 weeks Injected knee today Exercises 3x a week Ice 2x a day for 20 minutes Wear brace with activity

## 2019-09-30 NOTE — Assessment & Plan Note (Signed)
Patient has patellofemoral arthritis of both knees.  More with trace swelling noted on the right knee today.  Injection given today.  Tolerated the procedure well.  Discussed icing regimen and home exercises.  Discussed avoiding certain activities.  Discussed icing regimen.  Follow-up again in 4 to 8 weeks.
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
## 2019-10-04 NOTE — Progress Notes (Signed)
Evaluation Performed:  Follow-up visit  Date:  10/06/2019   ID:  Carolyn [name], DOB [birth year], MRN [id number]  PCP:  [name], MD  Cardiologist:  No primary care provider on file. [place] Electrophysiologist:  None   Chief Complaint:  HTN  History of Present Illness:    59 y.o. with a History of HTN, OSA, Anemia. Normal cath in 2014 Last echo 08/04/17 showed EF 50-55%  Trivial MR  . Started on labetalol for her BP last year  She has not had cardiac issues recently.  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    Past Medical History:  Diagnosis Date  . Allergy    seasonal  . Anemia    Anemia of chronic disease suspected  . Anxiety attack   . Arthritis   . Benign fundic gland polyps of stomach   . Dyslipidemia   . Exertional shortness of breath   . GERD (gastroesophageal reflux disease)   . Heart murmur   . Hyperlipidemia   . Hypertension   . Migraines    "probably weekly" (01/14/2013)  . Type II diabetes mellitus ([place])    Past Surgical History:  Procedure Laterality Date  . CHOLECYSTECTOMY  1990  . COLONOSCOPY    . ENDOMETRIAL ABLATION  ~ 2000-2002   "twice" (01/14/2013)  . LEFT HEART CATHETERIZATION WITH CORONARY ANGIOGRAM N/A 01/15/2013   Procedure: LEFT HEART CATHETERIZATION WITH CORONARY ANGIOGRAM;  Surgeon: Blane Ohara, MD;  Location[facility] CATH LAB;  Service: Cardiovascular;  Laterality: N/A;  . LEFT  OOPHORECTOMY Left ~ 2003  . ORIF TIBIA & FIBULA FRACTURES Left 2003  . VAGINAL HYSTERECTOMY  ~ 2003     Current Meds  Medication Sig  . BD PEN NEEDLE MICRO U/F 32G X 6 MM MISC USE 1 DAILY  . blood glucose meter kit and supplies KIT Dispense based on patient and insurance preference. Use up to four times daily as directed. (FOR ICD-9 250.00, 250.01).  . Blood Glucose Monitoring Suppl (ONETOUCH VERIO IQ SYSTEM) W/DEVICE KIT 1 Act by Does not apply route 3 (three) times daily.  . cloNIDine (CATAPRES - DOSED IN MG/24 HR) 0.1 mg/24hr patch Place 1 patch (0.1 mg total) onto the skin once a week.  . Diclofenac Sodium 2 % SOLN Place 2 g onto the skin 2 (two) times daily.  Marland Kitchen EPINEPHrine 0.3 mg/0.3 mL IJ SOAJ injection INJECT 0.3 MLS INTO THE MUSCLE ONCE FOR 1 DOSE  . Fexofenadine HCl (ALLEGRA ALLERGY PO) Take by mouth.  . fluconazole (DIFLUCAN) 150 MG tablet Take 1 tablet as directed and then repeat after 72 hours;  . fluticasone (FLONASE) 50 MCG/ACT nasal spray Place 2 sprays into both nostrils daily.  Marland Kitchen gabapentin (NEURONTIN) 100 MG capsule Take 2 capsules (200 mg total) by mouth at bedtime.  Marland Kitchen glucose blood (ONE TOUCH ULTRA TEST) test strip Use to test blood sugar three times a week  and prn if having symptoms of low blood sugar Dx: 250.92 90 day supply  . glucose blood (ONETOUCH VERIO) test strip Use TID  . labetalol (NORMODYNE) 300 MG tablet Take 1 tablet (300 mg total) by mouth 2 (two) times daily. Please keep upcoming appt with Dr. Johnsie Cancel in June before anymore refills. Thank you  . Lancets (ONETOUCH DELICA PLUS [id number]) MISC USE UP TO 4 TIMES DAILY AS DIRECTED  . linaclotide (LINZESS) 145 MCG CAPS capsule Take 1 capsule (145 mcg total) by mouth daily before breakfast.  . omega-3 acid ethyl esters (LOVAZA) 1 g capsule Take 2 capsules (2 g total) by mouth 2 (two) times daily.  Marland Kitchen omeprazole (PRILOSEC) 40 MG capsule TAKE 1 CAPSULE BY MOUTH TWICE DAILY AT 8 AM AND 10PM  . Rimegepant Sulfate  (NURTEC) 75 MG TBDP Take 1 tablet by mouth daily as needed.  . Semaglutide (RYBELSUS) 14 MG TABS Take 1 tablet by mouth daily.  . simvastatin (ZOCOR) 40 MG tablet TAKE 1 TABLET BY MOUTH AT BEDTIME  . telmisartan (MICARDIS) 80 MG tablet Take 1 tablet (80 mg total) by mouth daily.  Marland Kitchen triamterene-hydrochlorothiazide (DYAZIDE) 37.5-25 MG capsule Take 1 each (1 capsule total) by mouth daily.     Allergies:   Invokana [canagliflozin], Codeine, Flexeril [cyclobenzaprine], Morphine and related, Reglan [metoclopramide], and Silicon   Social History   Tobacco Use  . Smoking status: Never Smoker  . Smokeless tobacco: Never Used  Vaping Use  . Vaping Use: Never used  Substance Use Topics  . Alcohol use: No    Alcohol/week: 0.0 standard drinks    Comment: socially  . Drug use: No     Family Hx: The patient's family history includes Alzheimer's disease in her mother; Cancer in her sister; Diabetes in her brother, sister, and sister; Emphysema in her father and sister; Hypertension in her mother; Thyroid cancer in her sister. There is no history of Colon cancer.  ROS:   Please see the history of present illness.     All other systems reviewed and are negative.   Prior CV studies:   The following studies were reviewed today:  Echo 08/04/17 Study Conclusions  - Left ventricle: The cavity size was normal. Wall thickness was   increased in a pattern of moderate LVH. Systolic function was   normal. The estimated ejection fraction was in the range of 50%   to 55%. Images were inadequate for LV wall motion assessment.   Doppler parameters are consistent with abnormal left ventricular   relaxation (grade 1 diastolic dysfunction). The E/e&' ratio is   between 8-15, suggesting indeterminate LV filling pressure. - Aortic valve: Trileaflet. Sclerosis without stenosis. There was   no regurgitation. - Mitral valve: Calcified annulus. Mildly thickened leaflets .   There was trivial  regurgitation. - Left atrium: The atrium was normal in size. - Tricuspid valve: There was trivial regurgitation. - Pulmonary arteries: PA peak pressure: 16 mm Hg (S). - Inferior vena cava: The vessel was normal in size. The   respirophasic diameter changes were in the normal range (= 50%),   consistent with normal central venous pressure.  Impressions:  - Compared to a prior study in 2017, the LVEF is lower at 50-55%.  Labs/Other Tests and Data Reviewed:    EKG:  SR rate 88 nonspecific ST changes 10/06/19 SR normal ECG   Recent Labs: 09/09/2019: ALT 23; BUN 19; Creatinine, Ser 1.12; Hemoglobin 10.2; Platelets 338.0; Potassium 3.5; Sodium 136; TSH 1.73   Recent  Lipid Panel Lab Results  Component Value Date/Time   CHOL 169 09/09/2019 03:54 PM   TRIG 396.0 (H) 09/09/2019 03:54 PM   HDL 32.10 (L) 09/09/2019 03:54 PM   CHOLHDL 5 09/09/2019 03:54 PM   LDLCALC 68 07/30/2013 11:22 AM   LDLDIRECT 72.0 09/09/2019 03:54 PM    Wt Readings from Last 3 Encounters:  10/06/19 186 lb (84.4 kg)  09/30/19 188 lb (85.3 kg)  09/09/19 188 lb (85.3 kg)     Objective:    Vital Signs:  BP 128/88   Pulse 87   Ht _0  (1.626 m)   Wt 186 lb (84.4 kg)   SpO2 98%   BMI 31.93 kg/m    Affect appropriate Overweight black female  HEENT: normal Neck supple with no adenopathy JVP normal no bruits no thyromegaly Lungs clear with no wheezing and good diaphragmatic motion Heart:  S1/S2 no murmur, no rub, gallop or click PMI normal Abdomen: benighn, BS positve, no tenderness, no AAA no bruit.  No HSM or HJR Distal pulses intact with no bruits No edema Neuro non-focal Skin warm and dry No muscular weakness  ASSESSMENT & PLAN:    1. HTN:  Well controlled.  Continue current medications and low sodium Dash type diet.   2. Abnormal ECG:  With echo 07/2017 showing low normal EF consider f/u echo if symptoms develop or in a year  3. PT/OT:  Has seen [name] for wrist and right knee/foot pain  f/u as needed  4.  DM:  Discussed low carb diet.  Target hemoglobin A1c is 6.5 or less.  Continue current medications. 5. HLD:  On statin and vascepa f/u Dr Ronnald Ramp LDL at goal 81 mg/dL  COVID-19 Education: The signs and symptoms of COVID-19 were discussed with the patient and how to seek care for testing (follow up with PCP or arrange E-visit).  The importance of social distancing was discussed today.    Medication Adjustments/Labs and Tests Ordered: Current medicines are reviewed at length with the patient today.  Concerns regarding medicines are outlined above.  Tests Ordered: No orders of the defined types were placed in this encounter.  Medication Changes: No orders of the defined types were placed in this encounter.   Disposition:  Follow up in a year   Signed, Jenkins Rouge, MD  10/06/2019 8:54 AM    [place]

## 2019-10-06 ENCOUNTER — Ambulatory Visit (INDEPENDENT_AMBULATORY_CARE_PROVIDER_SITE_OTHER): Payer: No Typology Code available for payment source | Admitting: Cardiovascular Disease

## 2019-10-06 ENCOUNTER — Other Ambulatory Visit: Payer: Self-pay

## 2019-10-06 ENCOUNTER — Encounter: Payer: Self-pay | Admitting: Cardiovascular Disease

## 2019-10-06 VITALS — BP 128/88 | HR 87 | Ht 64.0 in | Wt 186.0 lb

## 2019-10-06 DIAGNOSIS — I10 Essential (primary) hypertension: Secondary | ICD-10-CM

## 2019-10-06 NOTE — Patient Instructions (Signed)

## 2019-11-03 ENCOUNTER — Other Ambulatory Visit: Payer: Self-pay | Admitting: Obstetrics and Gynecology

## 2019-11-03 DIAGNOSIS — Z1231 Encounter for screening mammogram for malignant neoplasm of breast: Secondary | ICD-10-CM

## 2019-11-08 ENCOUNTER — Encounter: Payer: Self-pay | Admitting: Internal Medicine

## 2019-11-08 ENCOUNTER — Other Ambulatory Visit: Payer: Self-pay | Admitting: Internal Medicine

## 2019-11-08 ENCOUNTER — Ambulatory Visit: Payer: 59

## 2019-11-08 DIAGNOSIS — E785 Hyperlipidemia, unspecified: Secondary | ICD-10-CM

## 2019-11-08 MED ORDER — SIMVASTATIN 40 MG PO TABS: 40.0000 mg | ORAL_TABLET | Freq: Every day | ORAL | 1 refills | Status: DC

## 2019-11-09 ENCOUNTER — Ambulatory Visit (INDEPENDENT_AMBULATORY_CARE_PROVIDER_SITE_OTHER): Payer: No Typology Code available for payment source | Admitting: Internal Medicine

## 2019-11-09 ENCOUNTER — Encounter: Payer: Self-pay | Admitting: Internal Medicine

## 2019-11-09 ENCOUNTER — Other Ambulatory Visit: Payer: Self-pay

## 2019-11-09 VITALS — BP 130/80 | HR 91 | Temp 98.0°F | Resp 16 | Ht 64.0 in | Wt 185.0 lb

## 2019-11-09 DIAGNOSIS — K3184 Gastroparesis: Secondary | ICD-10-CM

## 2019-11-09 DIAGNOSIS — E1143 Type 2 diabetes mellitus with diabetic autonomic (poly)neuropathy: Secondary | ICD-10-CM

## 2019-11-09 DIAGNOSIS — R10815 Periumbilic abdominal tenderness: Secondary | ICD-10-CM | POA: Diagnosis not present

## 2019-11-09 MED ORDER — ONDANSETRON HCL 4 MG PO TABS: 4.0000 mg | ORAL_TABLET | Freq: Three times a day (TID) | ORAL | 4 refills | Status: DC | PRN

## 2019-11-09 NOTE — Progress Notes (Signed)
Subjective:  Patient ID: Carolyn [name], female    DOB: [birth year]  Age: 59 y.o. MRN: [id number]  CC: Abdominal Pain  This visit occurred during the SARS-CoV-2 public health emergency.  Safety protocols were in place, including screening questions prior to the visit, additional usage of staff PPE, and extensive cleaning of exam room while observing appropriate contact time as indicated for disinfecting solutions.    HPI Carolyn [name] presents for f/up - Over the last month she has had intermittent episodes of nausea, vomiting, abdominal cramps, and watery bowel movements.  Her last episode of vomiting was about 2 days ago.  She continued to have nausea and vomiting for about 24 hours and eventually developed a right subconjunctival hemorrhage.  A few years ago she underwent a nuclear medicine study that showed severe gastroparesis.  She eventually underwent upper endoscopy and was found not to have an obstruction.  Metoclopramide was prescribed but she did not tolerate it well.  She has not lost weight and denies loss of appetite.  Outpatient Medications Prior to Visit  Medication Sig Dispense Refill  . BD PEN NEEDLE MICRO U/F 32G X 6 MM MISC USE 1 DAILY 100 each 3  . blood glucose meter kit and supplies KIT Dispense based on patient and insurance preference. Use up to four times daily as directed. (FOR ICD-9 250.00, 250.01). 1 each 0  . Blood Glucose Monitoring Suppl (ONETOUCH VERIO IQ SYSTEM) W/DEVICE KIT 1 Act by Does not apply route 3 (three) times daily. 2 kit 0  . cloNIDine (CATAPRES - DOSED IN MG/24 HR) 0.1 mg/24hr patch Place 1 patch (0.1 mg total) onto the skin once a week. 12 patch 0  . Diclofenac Sodium 2 % SOLN Place 2 g onto the skin 2 (two) times daily. 112 g 3  . EPINEPHrine 0.3 mg/0.3 mL IJ SOAJ injection INJECT 0.3 MLS INTO THE MUSCLE ONCE FOR 1 DOSE  0  . Fexofenadine HCl (ALLEGRA ALLERGY PO) Take by mouth.    . fluticasone (FLONASE) 50 MCG/ACT nasal spray Place 2 sprays  into both nostrils daily. 16 g 6  . gabapentin (NEURONTIN) 100 MG capsule Take 2 capsules (200 mg total) by mouth at bedtime. 180 capsule 3  . glucose blood (ONE TOUCH ULTRA TEST) test strip Use to test blood sugar three times a week and prn if having symptoms of low blood sugar Dx: 250.92 90 day supply 100 each 11  . glucose blood (ONETOUCH VERIO) test strip Use TID 100 each 12  . labetalol (NORMODYNE) 300 MG tablet Take 1 tablet (300 mg total) by mouth 2 (two) times daily. Please keep upcoming appt with Dr. Johnsie Cancel in June before anymore refills. Thank you 180 tablet 0  . Lancets (ONETOUCH DELICA PLUS [id number]) MISC USE UP TO 4 TIMES DAILY AS DIRECTED  1  . linaclotide (LINZESS) 145 MCG CAPS capsule Take 1 capsule (145 mcg total) by mouth daily before breakfast. 90 capsule 0  . omega-3 acid ethyl esters (LOVAZA) 1 g capsule Take 2 capsules (2 g total) by mouth 2 (two) times daily. 360 capsule 1  . omeprazole (PRILOSEC) 40 MG capsule TAKE 1 CAPSULE BY MOUTH TWICE DAILY AT 8 AM AND 10PM 180 capsule 0  . Rimegepant Sulfate (NURTEC) 75 MG TBDP Take 1 tablet by mouth daily as needed. 8 tablet 0  . Semaglutide (RYBELSUS) 14 MG TABS Take 1 tablet by mouth daily. 90 tablet 1  . simvastatin (ZOCOR) 40 MG tablet Take 1 tablet (  40 mg total) by mouth at bedtime. 90 tablet 1  . telmisartan (MICARDIS) 80 MG tablet Take 1 tablet (80 mg total) by mouth daily. 90 tablet 0  . triamterene-hydrochlorothiazide (DYAZIDE) 37.5-25 MG capsule Take 1 each (1 capsule total) by mouth daily. 90 capsule 1   No facility-administered medications prior to visit.    ROS Review of Systems  Constitutional: Negative for appetite change, chills, diaphoresis, fatigue, fever and unexpected weight change.  HENT: Negative.  Negative for trouble swallowing.   Eyes: Positive for redness.  Respiratory: Negative for cough, chest tightness, shortness of breath and wheezing.   Cardiovascular: Negative for chest pain, palpitations and leg  swelling.  Gastrointestinal: Positive for abdominal pain, diarrhea, nausea and vomiting. Negative for abdominal distention, blood in stool and constipation.  Endocrine: Negative.   Genitourinary: Negative.  Negative for difficulty urinating.  Musculoskeletal: Negative.  Negative for arthralgias, back pain, myalgias and neck pain.  Skin: Negative.  Negative for color change and pallor.  Neurological: Negative.  Negative for dizziness, weakness, light-headedness and headaches.  Hematological: Negative for adenopathy. Does not bruise/bleed easily.  Psychiatric/Behavioral: Negative.     Objective:  BP (!) 130/80 (BP Location: Left Arm, Patient Position: Sitting, Cuff Size: Large)   Pulse 91   Temp 98 F (36.7 C) (Oral)   Resp 16   Ht '5\' 4"'  (1.626 m)   Wt 185 lb (83.9 kg)   SpO2 97%   BMI 31.76 kg/m   BP Readings from Last 3 Encounters:  11/09/19 (!) 130/80  10/06/19 128/88  09/30/19 120/74    Wt Readings from Last 3 Encounters:  11/09/19 185 lb (83.9 kg)  10/06/19 186 lb (84.4 kg)  09/30/19 188 lb (85.3 kg)    Physical Exam Vitals reviewed.  Constitutional:      General: She is not in acute distress.    Appearance: Normal appearance. She is not ill-appearing, toxic-appearing or diaphoretic.  HENT:     Nose: Nose normal.     Mouth/Throat:     Mouth: Mucous membranes are moist.  Eyes:     General: No scleral icterus.    Conjunctiva/sclera:     Right eye: Hemorrhage present.     Comments: Right lateral subconjunctival hemorrhage  Cardiovascular:     Rate and Rhythm: Normal rate and regular rhythm.     Heart sounds: No murmur heard.   Pulmonary:     Effort: Pulmonary effort is normal.     Breath sounds: No stridor. No wheezing, rhonchi or rales.  Abdominal:     General: Abdomen is flat. Bowel sounds are decreased. There is no distension.     Palpations: There is no hepatomegaly, splenomegaly or mass.     Tenderness: There is abdominal tenderness in the periumbilical  area. There is no guarding or rebound. Negative signs include Murphy's sign.     Hernia: No hernia is present.  Musculoskeletal:        General: Normal range of motion.     Cervical back: Neck supple.  Lymphadenopathy:     Cervical: No cervical adenopathy.  Skin:    General: Skin is warm and dry.  Neurological:     General: No focal deficit present.     Mental Status: She is alert.  Psychiatric:        Mood and Affect: Mood normal.        Behavior: Behavior normal.     Lab Results  Component Value Date   WBC 9.1 11/09/2019   HGB  10.4 (L) 11/09/2019   HCT 30.9 (L) 11/09/2019   PLT 366 11/09/2019   GLUCOSE 85 11/09/2019   CHOL 169 09/09/2019   TRIG 396.0 (H) 09/09/2019   HDL 32.10 (L) 09/09/2019   LDLDIRECT 72.0 09/09/2019   LDLCALC 68 07/30/2013   ALT 22 11/09/2019   AST 16 11/09/2019   NA 136 11/09/2019   K 3.9 11/09/2019   CL 98 11/09/2019   CREATININE 0.90 11/09/2019   BUN 12 11/09/2019   CO2 27 11/09/2019   TSH 1.73 09/09/2019   INR 1.04 01/14/2013   HGBA1C 6.7 (H) 09/09/2019   MICROALBUR 24.9 (H) 09/09/2019    DG Abd Acute W/Chest  Result Date: 02/22/2019 CLINICAL DATA:  Right flank pain EXAM: DG ABDOMEN ACUTE W/ 1V CHEST COMPARISON:  02/06/2018 FINDINGS: The heart size is enlarged. Bibasilar atelectasis is noted. There is no pneumothorax. No large pleural effusion. Aortic calcifications are noted. The bowel gas pattern is nonobstructive. No radiopaque calcifications are noted. The patient is status post prior cholecystectomy. There is a prominent Riedel's lobe. IMPRESSION: Negative abdominal radiographs.  No acute cardiopulmonary disease. Electronically Signed   By: Constance Holster M.D.   On: 02/22/2019 15:27   DG HIP UNILAT WITH PELVIS 2-3 VIEWS LEFT  Result Date: 02/22/2019 CLINICAL DATA:  59 year old female with left hip pain. EXAM: DG HIP (WITH OR WITHOUT PELVIS) 2-3V LEFT COMPARISON:  Abdominal radiograph dated 02/22/2019. FINDINGS: There is no acute  fracture or dislocation. Mild arthritic changes with mild joint space narrowing and spurring. The soft tissues are unremarkable. IMPRESSION: No acute fracture or dislocation. Electronically Signed   By: Anner Crete M.D.   On: 02/22/2019 15:26    Assessment & Plan:   Dyonna was seen today for abdominal pain.  Diagnoses and all orders for this visit:  Gastroparesis due to DM ([place]) -     ondansetron (ZOFRAN) 4 MG tablet; Take 1 tablet (4 mg total) by mouth every 8 (eight) hours as needed for nausea or vomiting.  Periumbilical abdominal tenderness without rebound tenderness- Her H&H have improved slightly so I am not concerned about blood loss.  Her labs are otherwise reassuring so I suspect she does not have pancreatitis, hepatitis, renal disease, or a bowel obstruction.  Her symptoms are suspicious for worsening gastroparesis.  She did not tolerate metoclopramide and only has nausea and vomiting about once a week.  I have therefore recommended that she use ondansetron as needed. -     CBC with Differential/Platelet; Future -     BASIC METABOLIC PANEL WITH GFR; Future -     Lipase; Future -     Amylase; Future -     Hepatic function panel; Future -     Urinalysis, Routine w reflex microscopic; Future -     Urinalysis, Routine w reflex microscopic -     Hepatic function panel -     Amylase -     Lipase -     BASIC METABOLIC PANEL WITH GFR -     CBC with Differential/Platelet  Other orders -     MICROSCOPIC MESSAGE   I am having Dwanda H. Gosselin start on ondansetron. I am also having her maintain her glucose blood, glucose blood, OneTouch Verio IQ System, blood glucose meter kit and supplies, EPINEPHrine, OneTouch Delica Plus [id number], BD Pen Needle Micro U/F, Fexofenadine HCl (ALLEGRA ALLERGY PO), Diclofenac Sodium, gabapentin, omeprazole, triamterene-hydrochlorothiazide, linaclotide, Rybelsus, Nurtec, cloNIDine, omega-3 acid ethyl esters, labetalol, fluticasone, telmisartan, and  simvastatin.  Meds ordered  this encounter  Medications  . ondansetron (ZOFRAN) 4 MG tablet    Sig: Take 1 tablet (4 mg total) by mouth every 8 (eight) hours as needed for nausea or vomiting.    Dispense:  30 tablet    Refill:  4   I spent 50 minutes in preparing to see the patient by review of recent labs, imaging and procedures, obtaining and reviewing separately obtained history, communicating with the patient and family or caregiver, ordering medications, tests or procedures, and documenting clinical information in the EHR including the differential Dx, treatment, and any further evaluation and other management of 1. Gastroparesis due to DM ([place]) 2. Periumbilical abdominal tenderness without rebound tenderness     Follow-up: Return in about 4 weeks (around 12/07/2019).  Scarlette Calico, MD

## 2019-11-09 NOTE — Patient Instructions (Signed)
Gastroparesis  Gastroparesis is a condition in which food takes longer than normal to empty from the stomach. The condition is usually long-lasting (chronic). It may also be called delayed gastric emptying. There is no cure, but there are treatments and things that you can do at home to help relieve symptoms. Treating the underlying condition that causes gastroparesis can also help relieve symptoms. What are the causes? In many cases, the cause of this condition is not known. Possible causes include:  A hormone (endocrine) disorder, such as hypothyroidism or diabetes.  A nervous system disease, such as Parkinson's disease or multiple sclerosis.  Cancer, infection, or surgery that affects the stomach or vagus nerve. The vagus nerve runs from your chest, through your neck, to the lower part of your brain.  A connective tissue disorder, such as scleroderma.  Certain medicines. What increases the risk? You are more likely to develop this condition if you:  Have certain disorders or diseases, including: ? An endocrine disorder. ? An eating disorder. ? Amyloidosis. ? Scleroderma. ? Parkinson's disease. ? Multiple sclerosis. ? Cancer or infection of the stomach or the vagus nerve.  Have had surgery on the stomach or vagus nerve.  Take certain medicines.  Are female. What are the signs or symptoms? Symptoms of this condition include:  Feeling full after eating very little.  Nausea.  Vomiting.  Heartburn.  Abdominal bloating.  Inconsistent blood sugar (glucose) levels on blood tests.  Lack of appetite.  Weight loss.  Acid from the stomach coming up into the esophagus (gastroesophageal reflux).  Sudden tightening (spasm) of the stomach, which can be painful. Symptoms may come and go. Some people may not notice any symptoms. How is this diagnosed? This condition is diagnosed with tests, such as:  Tests that check how long it takes food to move through the stomach and  intestines. These tests include: ? Upper gastrointestinal (GI) series. For this test, you drink a liquid that shows up well on X-rays, and then X-rays will be taken of your intestines. ? Gastric emptying scintigraphy. For this test, you eat food that contains a small amount of radioactive material, and then scans are taken. ? Wireless capsule GI monitoring system. For this test, you swallow a pill (capsule) that records information about how foods and fluid move through your stomach.  Gastric manometry. For this test, a tube is passed down your throat and into your stomach to measure electrical and muscular activity.  Endoscopy. For this test, a long, thin tube is passed down your throat and into your stomach to check for problems in your stomach lining.  Ultrasound. This test uses sound waves to create images of inside the body. This can help rule out gallbladder disease or pancreatitis as a cause of your symptoms. How is this treated? There is no cure for gastroparesis. Treatment may include:  Treating the underlying cause.  Managing your symptoms by making changes to your diet and exercise habits.  Taking medicines to control nausea and vomiting and to stimulate stomach muscles.  Getting food through a feeding tube in the hospital. This may be done in severe cases.  Having surgery to insert a device into your body that helps improve stomach emptying and control nausea and vomiting (gastric neurostimulator). Follow these instructions at home:  Take over-the-counter and prescription medicines only as told by your health care provider.  Follow instructions from your health care provider about eating or drinking restrictions. Your health care provider may recommend that you: ? Eat   smaller meals more often. ? Eat low-fat foods. ? Eat low-fiber forms of high-fiber foods. For example, eat cooked vegetables instead of raw vegetables. ? Have only liquid foods instead of solid foods. Liquid  foods are easier to digest.  Drink enough fluid to keep your urine pale yellow.  Exercise as often as told by your health care provider.  Keep all follow-up visits as told by your health care provider. This is important. Contact a health care provider if you:  Notice that your symptoms do not improve with treatment.  Have new symptoms. Get help right away if you:  Have severe abdominal pain that does not improve with treatment.  Have nausea that is severe or does not go away.  Cannot drink fluids without vomiting. Summary  Gastroparesis is a chronic condition in which food takes longer than normal to empty from the stomach.  Symptoms include nausea, vomiting, heartburn, abdominal bloating, and loss of appetite.  Eating smaller portions, and low-fat, low-fiber foods may help you manage your symptoms.  Get help right away if you have severe abdominal pain. This information is not intended to replace advice given to you by your health care provider. Make sure you discuss any questions you have with your health care provider. Document Revised: 06/30/2017 Document Reviewed: 02/04/2017 Elsevier Patient Education  2020 [employer]  

## 2019-11-10 LAB — URINALYSIS, ROUTINE W REFLEX MICROSCOPIC
Bacteria, UA: NONE SEEN /HPF
Bilirubin Urine: NEGATIVE
Glucose, UA: NEGATIVE
Hgb urine dipstick: NEGATIVE
Hyaline Cast: NONE SEEN /LPF
Ketones, ur: NEGATIVE
Leukocytes,Ua: NEGATIVE
Nitrite: NEGATIVE
RBC / HPF: NONE SEEN /HPF (ref 0–2)
Specific Gravity, Urine: 1.008 (ref 1.001–1.03)
WBC, UA: NONE SEEN /HPF (ref 0–5)
pH: 6 (ref 5.0–8.0)

## 2019-11-10 LAB — CBC WITH DIFFERENTIAL/PLATELET
Absolute Monocytes: 619 cells/uL (ref 200–950)
Basophils Absolute: 73 cells/uL (ref 0–200)
Basophils Relative: 0.8 %
Eosinophils Absolute: 137 cells/uL (ref 15–500)
Eosinophils Relative: 1.5 %
HCT: 30.9 % — ABNORMAL LOW (ref 35.0–45.0)
Hemoglobin: 10.4 g/dL — ABNORMAL LOW (ref 11.7–15.5)
Lymphs Abs: 3130 cells/uL (ref 850–3900)
MCH: 29 pg (ref 27.0–33.0)
MCHC: 33.7 g/dL (ref 32.0–36.0)
MCV: 86.1 fL (ref 80.0–100.0)
MPV: 10.1 fL (ref 7.5–12.5)
Monocytes Relative: 6.8 %
Neutro Abs: 5142 cells/uL (ref 1500–7800)
Neutrophils Relative %: 56.5 %
Platelets: 366 10*3/uL (ref 140–400)
RBC: 3.59 10*6/uL — ABNORMAL LOW (ref 3.80–5.10)
RDW: 12.8 % (ref 11.0–15.0)
Total Lymphocyte: 34.4 %
WBC: 9.1 10*3/uL (ref 3.8–10.8)

## 2019-11-10 LAB — BASIC METABOLIC PANEL WITH GFR
BUN: 12 mg/dL (ref 7–25)
CO2: 27 mmol/L (ref 20–32)
Calcium: 10.3 mg/dL (ref 8.6–10.4)
Chloride: 98 mmol/L (ref 98–110)
Creat: 0.9 mg/dL (ref 0.50–1.05)
GFR, Est African American: 82 mL/min/{1.73_m2} (ref >=60)
GFR, Est Non African American: 71 mL/min/{1.73_m2} (ref >=60)
Glucose, Bld: 85 mg/dL (ref 65–99)
Potassium: 3.9 mmol/L (ref 3.5–5.3)
Sodium: 136 mmol/L (ref 135–146)

## 2019-11-10 LAB — HEPATIC FUNCTION PANEL
AG Ratio: 1.5 (calc) (ref 1.0–2.5)
ALT: 22 U/L (ref 6–29)
AST: 16 U/L (ref 10–35)
Albumin: 4.8 g/dL (ref 3.6–5.1)
Alkaline phosphatase (APISO): 77 U/L (ref 37–153)
Bilirubin, Direct: 0.1 mg/dL (ref 0.0–0.2)
Globulin: 3.1 g/dL (calc) (ref 1.9–3.7)
Indirect Bilirubin: 0.3 mg/dL (calc) (ref 0.2–1.2)
Total Bilirubin: 0.4 mg/dL (ref 0.2–1.2)
Total Protein: 7.9 g/dL (ref 6.1–8.1)

## 2019-11-10 LAB — LIPASE: Lipase: 53 U/L (ref 7–60)

## 2019-11-10 LAB — AMYLASE: Amylase: 87 U/L (ref 21–101)

## 2019-11-12 ENCOUNTER — Ambulatory Visit: Payer: 59

## 2019-11-16 NOTE — Progress Notes (Signed)
Patient Care Team: [name], MD as PCP - General (Internal Medicine)  DIAGNOSIS:    ICD-10-CM   1. Anemia in other chronic diseases classified elsewhere  D63.8     CHIEF COMPLIANT: Follow-up of normocytic anemia to review labs  INTERVAL HISTORY: Carolyn [name] is a 59 y.o. with above-mentioned history of normocytic anemia. Labs on 11/09/19 showed Hg 10.4, HCT 30.9, platelets 366. She presents to the clinic today to review recent labs.  She reports to be doing quite well without any problems or concerns.  Does not have any fatigue or shortness of breath exertion or dizziness or lightheadedness. She does me that her triglycerides have been running very high. She was placed on Catapres patch and since then she has been feeling a lot better.    ALLERGIES:  is allergic to invokana [canagliflozin], codeine, flexeril [cyclobenzaprine], morphine and related, reglan [metoclopramide], and silicon.  MEDICATIONS:  Current Outpatient Medications  Medication Sig Dispense Refill  . BD PEN NEEDLE MICRO U/F 32G X 6 MM MISC USE 1 DAILY 100 each 3  . blood glucose meter kit and supplies KIT Dispense based on patient and insurance preference. Use up to four times daily as directed. (FOR ICD-9 250.00, 250.01). 1 each 0  . Blood Glucose Monitoring Suppl (ONETOUCH VERIO IQ SYSTEM) W/DEVICE KIT 1 Act by Does not apply route 3 (three) times daily. 2 kit 0  . cloNIDine (CATAPRES - DOSED IN MG/24 HR) 0.1 mg/24hr patch Place 1 patch (0.1 mg total) onto the skin once a week. 12 patch 0  . Diclofenac Sodium 2 % SOLN Place 2 g onto the skin 2 (two) times daily. 112 g 3  . EPINEPHrine 0.3 mg/0.3 mL IJ SOAJ injection INJECT 0.3 MLS INTO THE MUSCLE ONCE FOR 1 DOSE  0  . Fexofenadine HCl (ALLEGRA ALLERGY PO) Take by mouth.    . fluticasone (FLONASE) 50 MCG/ACT nasal spray Place 2 sprays into both nostrils daily. 16 g 6  . gabapentin (NEURONTIN) 100 MG capsule Take 2 capsules (200 mg total) by mouth at bedtime.  180 capsule 3  . glucose blood (ONE TOUCH ULTRA TEST) test strip Use to test blood sugar three times a week and prn if having symptoms of low blood sugar Dx: 250.92 90 day supply 100 each 11  . glucose blood (ONETOUCH VERIO) test strip Use TID 100 each 12  . labetalol (NORMODYNE) 300 MG tablet Take 1 tablet (300 mg total) by mouth 2 (two) times daily. Please keep upcoming appt with Dr. Johnsie Cancel in June before anymore refills. Thank you 180 tablet 0  . Lancets (ONETOUCH DELICA PLUS [id number]) MISC USE UP TO 4 TIMES DAILY AS DIRECTED  1  . linaclotide (LINZESS) 145 MCG CAPS capsule Take 1 capsule (145 mcg total) by mouth daily before breakfast. 90 capsule 0  . omega-3 acid ethyl esters (LOVAZA) 1 g capsule Take 2 capsules (2 g total) by mouth 2 (two) times daily. 360 capsule 1  . omeprazole (PRILOSEC) 40 MG capsule TAKE 1 CAPSULE BY MOUTH TWICE DAILY AT 8 AM AND 10PM 180 capsule 0  . ondansetron (ZOFRAN) 4 MG tablet Take 1 tablet (4 mg total) by mouth every 8 (eight) hours as needed for nausea or vomiting. 30 tablet 4  . Rimegepant Sulfate (NURTEC) 75 MG TBDP Take 1 tablet by mouth daily as needed. 8 tablet 0  . Semaglutide (RYBELSUS) 14 MG TABS Take 1 tablet by mouth daily. 90 tablet 1  . simvastatin (ZOCOR)  40 MG tablet Take 1 tablet (40 mg total) by mouth at bedtime. 90 tablet 1  . telmisartan (MICARDIS) 80 MG tablet Take 1 tablet (80 mg total) by mouth daily. 90 tablet 0  . triamterene-hydrochlorothiazide (DYAZIDE) 37.5-25 MG capsule Take 1 each (1 capsule total) by mouth daily. 90 capsule 1   No current facility-administered medications for this visit.    PHYSICAL EXAMINATION: ECOG PERFORMANCE STATUS: 1 - Symptomatic but completely ambulatory  Vitals:   11/17/19 1337  BP: 140/79  Pulse: 90  Resp: 18  Temp: 99.1 F (37.3 C)  SpO2: 99%   Filed Weights   11/17/19 1337  Weight: 185 lb 3.2 oz (84 kg)    LABORATORY DATA:  I have reviewed the data as listed CMP Latest Ref Rng & Units  11/09/2019 09/09/2019 02/22/2019  Glucose 65 - 99 mg/dL 85 98 135(H)  BUN 7 - 25 mg/dL _0 Creatinine 0.50 - 1.05 mg/dL 0.90 1.12 0.97  Sodium 135 - 146 mmol/L 136 136 136  Potassium 3.5 - 5.3 mmol/L 3.9 3.5 3.9  Chloride 98 - 110 mmol/L 98 101 101  CO2 20 - 32 mmol/L _1 Calcium 8.6 - 10.4 mg/dL 10.3 9.6 9.9  Total Protein 6.1 - 8.1 g/dL 7.9 7.8 -  Total Bilirubin 0.2 - 1.2 mg/dL 0.4 0.4 -  Alkaline Phos 39 - 117 U/L - 71 -  AST 10 - 35 U/L 16 17 -  ALT 6 - 29 U/L 22 23 -    Lab Results  Component Value Date   WBC 8.9 11/17/2019   HGB 10.5 (L) 11/17/2019   HCT 31.9 (L) 11/17/2019   MCV 88.6 11/17/2019   PLT 359 11/17/2019   NEUTROABS 5.2 11/17/2019    ASSESSMENT & PLAN:  Anemia in other chronic diseases classified elsewhere Normocytic anemia hemoglobin 10.5 on 10/27/2017 Lab review:  11/17/2018: Hemoglobin 9.9, MCV 89.2 rest of the CBC is normal 11/10/2019: Hemoglobin 10.4, MCV 86.1 the rest of the CBC is normal 11/17/2019: Hemoglobin 10.5, MCV 88.6  Felt to be related to anemia of chronic disease, possibly related to diabetes and hypertension Extensive work-up previously did not reveal any problems with iron, Q19, folic acid and micronutrients Bone marrow biopsy 10/27/2017: Trilineage hematopoiesis, no dyspoiesis. Cytogenetics were normal   If the hemoglobin stays consistently below 10 then we will consider giving erythropoietin stimulating agents.  Plan: Watchful monitoring and follow-up in 1 year    No orders of the defined types were placed in this encounter.  The patient has a good understanding of the overall plan. she agrees with it. she will call with any problems that may develop before the next visit here.  Total time spent: 20 mins including face to face time and time spent for planning, charting and coordination of care  Nicholas Lose, MD 11/17/2019  I, Cloyde Reams Dorshimer, am acting as scribe for Dr. Nicholas Lose.  I have reviewed the above  documentation for accuracy and completeness, and I agree with the above.

## 2019-11-17 ENCOUNTER — Other Ambulatory Visit: Payer: Self-pay

## 2019-11-17 ENCOUNTER — Inpatient Hospital Stay
Payer: No Typology Code available for payment source | Attending: Hematology and Oncology | Admitting: Hematology and Oncology

## 2019-11-17 ENCOUNTER — Inpatient Hospital Stay: Payer: No Typology Code available for payment source

## 2019-11-17 DIAGNOSIS — D638 Anemia in other chronic diseases classified elsewhere: Secondary | ICD-10-CM | POA: Diagnosis present

## 2019-11-17 DIAGNOSIS — I10 Essential (primary) hypertension: Secondary | ICD-10-CM | POA: Diagnosis not present

## 2019-11-17 DIAGNOSIS — E119 Type 2 diabetes mellitus without complications: Secondary | ICD-10-CM | POA: Insufficient documentation

## 2019-11-17 DIAGNOSIS — Z79899 Other long term (current) drug therapy: Secondary | ICD-10-CM | POA: Insufficient documentation

## 2019-11-17 DIAGNOSIS — D649 Anemia, unspecified: Secondary | ICD-10-CM

## 2019-11-17 LAB — CBC WITH DIFFERENTIAL (CANCER CENTER ONLY)
Abs Immature Granulocytes: 0.02 10*3/uL (ref 0.00–0.07)
Basophils Absolute: 0.1 10*3/uL (ref 0.0–0.1)
Basophils Relative: 1 %
Eosinophils Absolute: 0.1 10*3/uL (ref 0.0–0.5)
Eosinophils Relative: 1 %
HCT: 31.9 % — ABNORMAL LOW (ref 36.0–46.0)
Hemoglobin: 10.5 g/dL — ABNORMAL LOW (ref 12.0–15.0)
Immature Granulocytes: 0 %
Lymphocytes Relative: 34 %
Lymphs Abs: 3 10*3/uL (ref 0.7–4.0)
MCH: 29.2 pg (ref 26.0–34.0)
MCHC: 32.9 g/dL (ref 30.0–36.0)
MCV: 88.6 fL (ref 80.0–100.0)
Monocytes Absolute: 0.4 10*3/uL (ref 0.1–1.0)
Monocytes Relative: 5 %
Neutro Abs: 5.2 10*3/uL (ref 1.7–7.7)
Neutrophils Relative %: 59 %
Platelet Count: 359 10*3/uL (ref 150–400)
RBC: 3.6 MIL/uL — ABNORMAL LOW (ref 3.87–5.11)
RDW: 13 % (ref 11.5–15.5)
WBC Count: 8.9 10*3/uL (ref 4.0–10.5)
nRBC: 0 % (ref 0.0–0.2)

## 2019-11-17 LAB — IRON AND TIBC
Iron: 51 ug/dL (ref 41–142)
Saturation Ratios: 16 % — ABNORMAL LOW (ref 21–57)
TIBC: 316 ug/dL (ref 236–444)
UIBC: 265 ug/dL (ref 120–384)

## 2019-11-17 LAB — FERRITIN: Ferritin: 153 ng/mL (ref 11–307)

## 2019-11-17 NOTE — Assessment & Plan Note (Signed)
Normocytic anemia hemoglobin 10.5 on 10/27/2017 Lab review:  11/17/2018: Hemoglobin 9.9, MCV 89.2 rest of the CBC is normal 11/10/2019: Hemoglobin 10.4, MCV 86.1 the rest of the CBC is normal  Felt to be related to anemia of chronic disease, possibly related to diabetes and hypertension Extensive work-up previously did not reveal any problems with iron, J19, folic acid and micronutrients  Bone marrow biopsy 10/27/2017: Trilineage hematopoiesis, no dyspoiesis. Cytogenetics were normal  Plan: Watchful monitoring and follow-up in 1 year  If the hemoglobin stays consistently below 10 then we will consider giving erythropoietin stimulating agents.

## 2019-11-18 ENCOUNTER — Encounter: Payer: Self-pay | Admitting: Family Medicine

## 2019-11-18 ENCOUNTER — Ambulatory Visit (INDEPENDENT_AMBULATORY_CARE_PROVIDER_SITE_OTHER): Payer: No Typology Code available for payment source | Admitting: Family Medicine

## 2019-11-18 ENCOUNTER — Ambulatory Visit (INDEPENDENT_AMBULATORY_CARE_PROVIDER_SITE_OTHER): Payer: No Typology Code available for payment source

## 2019-11-18 VITALS — BP 126/80 | HR 90 | Ht 64.0 in | Wt 184.0 lb

## 2019-11-18 DIAGNOSIS — G8929 Other chronic pain: Secondary | ICD-10-CM

## 2019-11-18 DIAGNOSIS — M5416 Radiculopathy, lumbar region: Secondary | ICD-10-CM | POA: Diagnosis not present

## 2019-11-18 DIAGNOSIS — M545 Low back pain, unspecified: Secondary | ICD-10-CM
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
## 2019-11-18 NOTE — Assessment & Plan Note (Signed)
Patient has had more of a left lumbar radiculitis previously.  Seems to be more of a muscle spasm with no true radicular symptoms at the moment.  I would like patient to potentially try the gabapentin again which patient stopped on her own at the moment.  Discussed short course of anti-inflammatories.  Patient will only do it for 3 days.  Home exercises given by athletic trainer.  X-rays pending.  Worsening symptoms will consider formal physical therapy or the potential for advanced imaging

## 2019-11-18 NOTE — Patient Instructions (Signed)
Restart gabapentin Duexis 2 times a day for 3 days Increase activity as tolerated Follow up again in 4-6 weeks

## 2019-11-23 ENCOUNTER — Ambulatory Visit: Payer: 59

## 2019-11-29 ENCOUNTER — Other Ambulatory Visit: Payer: Self-pay | Admitting: Internal Medicine

## 2019-11-29 ENCOUNTER — Encounter: Payer: Self-pay | Admitting: Internal Medicine

## 2019-11-29 DIAGNOSIS — I10 Essential (primary) hypertension: Secondary | ICD-10-CM

## 2019-11-29 DIAGNOSIS — K5904 Chronic idiopathic constipation: Secondary | ICD-10-CM

## 2019-11-29 MED ORDER — CLONIDINE 0.1 MG/24HR TD PTWK: 0.1000 mg | MEDICATED_PATCH | TRANSDERMAL | 0 refills | Status: DC

## 2019-11-29 MED ORDER — LINACLOTIDE 145 MCG PO CAPS: 145.0000 ug | ORAL_CAPSULE | Freq: Every day | ORAL | 0 refills | Status: DC

## 2019-12-02 ENCOUNTER — Ambulatory Visit
Admission: RE | Admit: 2019-12-02 | Discharge: 2019-12-02 | Disposition: A | Discharge status: Home / Self Care | Payer: No Typology Code available for payment source | Source: Ambulatory Visit | Attending: Obstetrics and Gynecology | Admitting: Obstetrics and Gynecology

## 2019-12-02 DIAGNOSIS — Z1231 Encounter for screening mammogram for malignant neoplasm of breast: Secondary | ICD-10-CM

## 2019-12-10 ENCOUNTER — Other Ambulatory Visit: Payer: Self-pay | Admitting: Internal Medicine

## 2019-12-10 DIAGNOSIS — K2100 Gastro-esophageal reflux disease with esophagitis, without bleeding: Secondary | ICD-10-CM

## 2019-12-10 MED ORDER — OMEPRAZOLE 40 MG PO CPDR: DELAYED_RELEASE_CAPSULE | ORAL | 0 refills | Status: DC

## 2019-12-16 ENCOUNTER — Telehealth: Payer: Self-pay | Admitting: Internal Medicine

## 2019-12-16 DIAGNOSIS — Z0279 Encounter for issue of other medical certificate: Secondary | ICD-10-CM

## 2019-12-16 NOTE — Telephone Encounter (Signed)
Patient dropped off renewal FMLA forms for her DM.  LOV:11/09/19 NOV: 01/11/20  Forms have been completed and placed in providers box to review and sign.

## 2019-12-16 NOTE — Telephone Encounter (Signed)
Forms have been signed, Faxed to [phone], Copy sent to scan &Charged for.   LVM to inform patient, &That original is ready to be picked up.

## 2019-12-19 ENCOUNTER — Other Ambulatory Visit: Payer: Self-pay | Admitting: Cardiovascular Disease

## 2019-12-19 ENCOUNTER — Other Ambulatory Visit: Payer: Self-pay

## 2019-12-21 MED ORDER — LABETALOL HCL 300 MG PO TABS: 300.0000 mg | ORAL_TABLET | Freq: Two times a day (BID) | ORAL | 2 refills | Status: DC
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
## 2020-01-03 ENCOUNTER — Ambulatory Visit (INDEPENDENT_AMBULATORY_CARE_PROVIDER_SITE_OTHER): Payer: No Typology Code available for payment source

## 2020-01-03 ENCOUNTER — Other Ambulatory Visit: Payer: Self-pay

## 2020-01-03 ENCOUNTER — Ambulatory Visit (INDEPENDENT_AMBULATORY_CARE_PROVIDER_SITE_OTHER): Payer: No Typology Code available for payment source | Admitting: Family Medicine

## 2020-01-03 ENCOUNTER — Encounter: Payer: Self-pay | Admitting: Family Medicine

## 2020-01-03 VITALS — BP 116/82 | HR 96 | Ht 64.0 in | Wt 184.0 lb

## 2020-01-03 DIAGNOSIS — M62838 Other muscle spasm: Secondary | ICD-10-CM | POA: Diagnosis not present

## 2020-01-03 DIAGNOSIS — M5416 Radiculopathy, lumbar region: Secondary | ICD-10-CM | POA: Diagnosis not present

## 2020-01-03 DIAGNOSIS — M542 Cervicalgia: Secondary | ICD-10-CM

## 2020-01-03 DIAGNOSIS — M999 Biomechanical lesion, unspecified: Secondary | ICD-10-CM | POA: Diagnosis not present

## 2020-01-03 MED ORDER — TIZANIDINE HCL 2 MG PO CAPS: 2.0000 mg | ORAL_CAPSULE | Freq: Three times a day (TID) | ORAL | 3 refills | Status: DC

## 2020-01-03 MED ORDER — GABAPENTIN 100 MG PO CAPS: 200.0000 mg | ORAL_CAPSULE | Freq: Every day | ORAL | 3 refills | Status: DC

## 2020-01-03 NOTE — Assessment & Plan Note (Signed)

## 2020-01-03 NOTE — Patient Instructions (Addendum)
Xray today Medications at your pharmacy zanaflex 2 mg at night as needed Scapular exercises  See me again in 7-8 weeks

## 2020-01-03 NOTE — Assessment & Plan Note (Signed)
Patient has more of a muscle spasm.  Discussed with patient in great length about icing regimen, home exercise, Zanaflex given in a low dose.  Patient has had difficulty with sedation with Flexeril previously and warned that this is similar but less sedating in most people.  Patient will try it at home first.  Patient is to increase activity slowly.  Follow-up with me again in 2 months

## 2020-01-03 NOTE — Assessment & Plan Note (Signed)
Patient is doing relatively well.  Attempted osteopathic manipulation today.  This is a chronic problem with a mild exacerbation.  Low-dose of Zanaflex given today.  Discussed which activities to do which wants to avoid, increase activity slowly.  Follow-up again 8 weeks

## 2020-01-11 ENCOUNTER — Other Ambulatory Visit (INDEPENDENT_AMBULATORY_CARE_PROVIDER_SITE_OTHER): Payer: No Typology Code available for payment source

## 2020-01-11 ENCOUNTER — Encounter: Payer: Self-pay | Admitting: Internal Medicine

## 2020-01-11 ENCOUNTER — Ambulatory Visit (INDEPENDENT_AMBULATORY_CARE_PROVIDER_SITE_OTHER): Payer: No Typology Code available for payment source | Admitting: Internal Medicine

## 2020-01-11 ENCOUNTER — Other Ambulatory Visit: Payer: Self-pay

## 2020-01-11 VITALS — BP 128/82 | HR 93 | Temp 98.8°F | Ht 64.0 in | Wt 187.0 lb

## 2020-01-11 DIAGNOSIS — Z23 Encounter for immunization: Secondary | ICD-10-CM

## 2020-01-11 DIAGNOSIS — F411 Generalized anxiety disorder: Secondary | ICD-10-CM | POA: Diagnosis not present

## 2020-01-11 DIAGNOSIS — E781 Pure hyperglyceridemia: Secondary | ICD-10-CM

## 2020-01-11 DIAGNOSIS — D538 Other specified nutritional anemias: Secondary | ICD-10-CM

## 2020-01-11 DIAGNOSIS — F410 Panic disorder [episodic paroxysmal anxiety] without agoraphobia: Secondary | ICD-10-CM | POA: Insufficient documentation

## 2020-01-11 DIAGNOSIS — E118 Type 2 diabetes mellitus with unspecified complications: Secondary | ICD-10-CM

## 2020-01-11 DIAGNOSIS — E519 Thiamine deficiency, unspecified: Secondary | ICD-10-CM

## 2020-01-11 LAB — CBC WITH DIFFERENTIAL/PLATELET
Basophils Absolute: 0.1 10*3/uL (ref 0.0–0.1)
Basophils Relative: 0.8 % (ref 0.0–3.0)
Eosinophils Absolute: 0.1 10*3/uL (ref 0.0–0.7)
Eosinophils Relative: 1.7 % (ref 0.0–5.0)
HCT: 30.7 % — ABNORMAL LOW (ref 36.0–46.0)
Hemoglobin: 10.3 g/dL — ABNORMAL LOW (ref 12.0–15.0)
Lymphocytes Relative: 32.2 % (ref 12.0–46.0)
Lymphs Abs: 2.5 10*3/uL (ref 0.7–4.0)
MCHC: 33.5 g/dL (ref 30.0–36.0)
MCV: 86.7 fl (ref 78.0–100.0)
Monocytes Absolute: 0.5 10*3/uL (ref 0.1–1.0)
Monocytes Relative: 6.8 % (ref 3.0–12.0)
Neutro Abs: 4.5 10*3/uL (ref 1.4–7.7)
Neutrophils Relative %: 58.5 % (ref 43.0–77.0)
Platelets: 321 10*3/uL (ref 150.0–400.0)
RBC: 3.54 Mil/uL — ABNORMAL LOW (ref 3.87–5.11)
RDW: 13.7 % (ref 11.5–15.5)
WBC: 7.8 10*3/uL (ref 4.0–10.5)

## 2020-01-11 LAB — TRIGLYCERIDES: Triglycerides: 443 mg/dL — ABNORMAL HIGH (ref 0.0–149.0)

## 2020-01-11 LAB — HEMOGLOBIN A1C: Hgb A1c MFr Bld: 6.9 % — ABNORMAL HIGH (ref 4.6–6.5)

## 2020-01-11 MED ORDER — CLONAZEPAM 1 MG PO TABS: 1.0000 mg | ORAL_TABLET | Freq: Every day | ORAL | 1 refills | Status: DC

## 2020-01-11 NOTE — Patient Instructions (Signed)
Type 2 Diabetes Mellitus, Diagnosis, Adult Type 2 diabetes (type 2 diabetes mellitus) is a long-term (chronic) disease. In type 2 diabetes, one or both of these problems may be present:  The pancreas does not make enough of a hormone called insulin.  Cells in the body do not respond properly to insulin that the body makes (insulin resistance). Normally, insulin allows blood sugar (glucose) to enter cells in the body. The cells use glucose for energy. Insulin resistance or lack of insulin causes excess glucose to build up in the blood instead of going into cells. As a result, high blood glucose (hyperglycemia) develops. What increases the risk? The following factors may make you more likely to develop type 2 diabetes:  Having a family member with type 2 diabetes.  Being overweight or obese.  Having an inactive (sedentary) lifestyle.  Having been diagnosed with insulin resistance.  Having a history of prediabetes, gestational diabetes, or polycystic ovary syndrome (PCOS).  Being of American-Indian, African-American, Hispanic/Latino, or Asian/Pacific Islander descent. What are the signs or symptoms? In the early stage of this condition, you may not have symptoms. Symptoms develop slowly and may include:  Increased thirst (polydipsia).  Increased hunger(polyphagia).  Increased urination (polyuria).  Increased urination during the night (nocturia).  Unexplained weight loss.  Frequent infections that keep coming back (recurring).  Fatigue.  Weakness.  Vision changes, such as blurry vision.  Cuts or bruises that are slow to heal.  Tingling or numbness in the hands or feet.  Dark patches on the skin (acanthosis nigricans). How is this diagnosed? This condition is diagnosed based on your symptoms, your medical history, a physical exam, and your blood glucose level. Your blood glucose may be checked with one or more of the following blood tests:  A fasting blood glucose (FBG)  test. You will not be allowed to eat (you will fast) for 8 hours or longer before a blood sample is taken.  A random blood glucose test. This test checks blood glucose at any time of day regardless of when you ate.  An A1c (hemoglobin A1c) blood test. This test provides information about blood glucose control over the previous 2-3 months.  An oral glucose tolerance test (OGTT). This test measures your blood glucose at two times: ? After fasting. This is your baseline blood glucose level. ? Two hours after drinking a beverage that contains glucose. You may be diagnosed with type 2 diabetes if:  Your FBG level is 126 mg/dL (7.0 mmol/L) or higher.  Your random blood glucose level is 200 mg/dL (11.1 mmol/L) or higher.  Your A1c level is 6.5% or higher.  Your OGTT result is higher than 200 mg/dL (11.1 mmol/L). These blood tests may be repeated to confirm your diagnosis. How is this treated? Your treatment may be managed by a specialist called an endocrinologist. Type 2 diabetes may be treated by following instructions from your health care provider about:  Making diet and lifestyle changes. This may include: ? Following an individualized nutrition plan that is developed by a diet and nutrition specialist (registered dietitian). ? Exercising regularly. ? Finding ways to manage stress.  Checking your blood glucose level as often as told.  Taking diabetes medicines or insulin daily. This helps to keep your blood glucose levels in the healthy range. ? If you use insulin, you may need to adjust the dosage depending on how physically active you are and what foods you eat. Your health care provider will tell you how to adjust your dosage.    Taking medicines to help prevent complications from diabetes, such as: ? Aspirin. ? Medicine to lower cholesterol. ? Medicine to control blood pressure. Your health care provider will set individualized treatment goals for you. Your goals will be based on  your age, other medical conditions you have, and how you respond to diabetes treatment. Generally, the goal of treatment is to maintain the following blood glucose levels:  Before meals (preprandial): 80-130 mg/dL (4.4-7.2 mmol/L).  After meals (postprandial): below 180 mg/dL (10 mmol/L).  A1c level: less than 7%. Follow these instructions at home: Questions to ask your health care provider  Consider asking the following questions: ? Do I need to meet with a diabetes educator? ? Where can I find a support group for people with diabetes? ? What equipment will I need to manage my diabetes at home? ? What diabetes medicines do I need, and when should I take them? ? How often do I need to check my blood glucose? ? What number can I call if I have questions? ? When is my next appointment? General instructions  Take over-the-counter and prescription medicines only as told by your health care provider.  Keep all follow-up visits as told by your health care provider. This is important.  For more information about diabetes, visit: ? American Diabetes Association (ADA): www.diabetes.org ? American Association of Diabetes Educators (AADE): www.diabeteseducator.org Contact a health care provider if:  Your blood glucose is at or above 240 mg/dL (13.3 mmol/L) for 2 days in a row.  You have been sick or have had a fever for 2 days or longer, and you are not getting better.  You have any of the following problems for more than 6 hours: ? You cannot eat or drink. ? You have nausea and vomiting. ? You have diarrhea. Get help right away if:  Your blood glucose is lower than 54 mg/dL (3.0 mmol/L).  You become confused or you have trouble thinking clearly.  You have difficulty breathing.  You have moderate or large ketone levels in your urine. Summary  Type 2 diabetes (type 2 diabetes mellitus) is a long-term (chronic) disease. In type 2 diabetes, the pancreas does not make enough of a  hormone called insulin, or cells in the body do not respond properly to insulin that the body makes (insulin resistance).  This condition is treated by making diet and lifestyle changes and taking diabetes medicines or insulin.  Your health care provider will set individualized treatment goals for you. Your goals will be based on your age, other medical conditions you have, and how you respond to diabetes treatment.  Keep all follow-up visits as told by your health care provider. This is important. This information is not intended to replace advice given to you by your health care provider. Make sure you discuss any questions you have with your health care provider. Document Revised: 05/30/2017 Document Reviewed: 05/05/2015 Elsevier Patient Education  2020 [employer]  

## 2020-01-11 NOTE — Progress Notes (Signed)
Subjective:  Patient ID: Carolyn [name], female    DOB: 07-17-1960  Age: 59 y.o. MRN: [id number]  CC: Hyperlipidemia, Hypertension, and Diabetes  This visit occurred during the SARS-CoV-2 public health emergency.  Safety protocols were in place, including screening questions prior to the visit, additional usage of staff PPE, and extensive cleaning of exam room while observing appropriate contact time as indicated for disinfecting solutions.    HPI CHICQUITA MENDEL presents for f/up - She has a new position at work - it is causing quite a bit of stress.  She is waking up at night with a sense of panic, anxiety, fear, and nightmares.  She is not willing to take an SSRI but she would like something to help her get through the night without experiencing the symptoms.  She complains of weight gain but she tells me her blood pressure and blood sugar have been well controlled.  Outpatient Medications Prior to Visit  Medication Sig Dispense Refill  . BD PEN NEEDLE MICRO U/F 32G X 6 MM MISC USE 1 DAILY 100 each 3  . blood glucose meter kit and supplies KIT Dispense based on patient and insurance preference. Use up to four times daily as directed. (FOR ICD-9 250.00, 250.01). 1 each 0  . Blood Glucose Monitoring Suppl (ONETOUCH VERIO IQ SYSTEM) W/DEVICE KIT 1 Act by Does not apply route 3 (three) times daily. 2 kit 0  . cloNIDine (CATAPRES - DOSED IN MG/24 HR) 0.1 mg/24hr patch Place 1 patch (0.1 mg total) onto the skin once a week. 12 patch 0  . Diclofenac Sodium 2 % SOLN Place 2 g onto the skin 2 (two) times daily. 112 g 3  . EPINEPHrine 0.3 mg/0.3 mL IJ SOAJ injection INJECT 0.3 MLS INTO THE MUSCLE ONCE FOR 1 DOSE  0  . Fexofenadine HCl (ALLEGRA ALLERGY PO) Take by mouth.    . fluticasone (FLONASE) 50 MCG/ACT nasal spray Place 2 sprays into both nostrils daily. 16 g 6  . gabapentin (NEURONTIN) 100 MG capsule Take 2 capsules (200 mg total) by mouth at bedtime. 180 capsule 3  . glucose blood (ONE TOUCH  ULTRA TEST) test strip Use to test blood sugar three times a week and prn if having symptoms of low blood sugar Dx: 250.92 90 day supply 100 each 11  . glucose blood (ONETOUCH VERIO) test strip Use TID 100 each 12  . labetalol (NORMODYNE) 300 MG tablet Take 1 tablet (300 mg total) by mouth 2 (two) times daily. 180 tablet 2  . Lancets (ONETOUCH DELICA PLUS [id number]) MISC USE UP TO 4 TIMES DAILY AS DIRECTED  1  . linaclotide (LINZESS) 145 MCG CAPS capsule Take 1 capsule (145 mcg total) by mouth daily before breakfast. 90 capsule 0  . omeprazole (PRILOSEC) 40 MG capsule TAKE 1 CAPSULE BY MOUTH TWICE DAILY AT 8 AM AND 10PM 180 capsule 0  . ondansetron (ZOFRAN) 4 MG tablet Take 1 tablet (4 mg total) by mouth every 8 (eight) hours as needed for nausea or vomiting. 30 tablet 4  . Rimegepant Sulfate (NURTEC) 75 MG TBDP Take 1 tablet by mouth daily as needed. 8 tablet 0  . Semaglutide (RYBELSUS) 14 MG TABS Take 1 tablet by mouth daily. 90 tablet 1  . simvastatin (ZOCOR) 40 MG tablet Take 1 tablet (40 mg total) by mouth at bedtime. 90 tablet 1  . telmisartan (MICARDIS) 80 MG tablet Take 1 tablet (80 mg total) by mouth daily. 90 tablet 0  .  tizanidine (ZANAFLEX) 2 MG capsule Take 1 capsule (2 mg total) by mouth 3 (three) times daily. 30 capsule 3  . triamterene-hydrochlorothiazide (DYAZIDE) 37.5-25 MG capsule Take 1 each (1 capsule total) by mouth daily. 90 capsule 1  . omega-3 acid ethyl esters (LOVAZA) 1 g capsule Take 2 capsules (2 g total) by mouth 2 (two) times daily. 360 capsule 1   No facility-administered medications prior to visit.    ROS Review of Systems  Constitutional: Positive for unexpected weight change (wt gain). Negative for appetite change, chills, diaphoresis, fatigue and fever.  HENT: Negative.   Eyes: Negative.  Negative for visual disturbance.  Respiratory: Negative for cough, chest tightness, shortness of breath and wheezing.   Cardiovascular: Negative for chest pain,  palpitations and leg swelling.  Gastrointestinal: Negative for abdominal pain, constipation, diarrhea and vomiting.  Endocrine: Negative.   Genitourinary: Negative.  Negative for difficulty urinating and dysuria.  Musculoskeletal: Negative.  Negative for myalgias.  Skin: Negative.   Neurological: Negative.  Negative for dizziness, weakness, light-headedness, numbness and headaches.  Hematological: Negative for adenopathy. Does not bruise/bleed easily.  Psychiatric/Behavioral: Positive for sleep disturbance. Negative for behavioral problems, confusion, decreased concentration, dysphoric mood, self-injury and suicidal ideas. The patient is nervous/anxious.     Objective:  BP 128/82   Pulse 93   Temp 98.8 F (37.1 C) (Oral)   Ht 5' 4" (1.626 m)   Wt 187 lb (84.8 kg)   SpO2 97%   BMI 32.10 kg/m   BP Readings from Last 3 Encounters:  01/11/20 128/82  01/03/20 116/82  11/18/19 126/80    Wt Readings from Last 3 Encounters:  01/11/20 187 lb (84.8 kg)  01/03/20 184 lb (83.5 kg)  11/18/19 184 lb (83.5 kg)    Physical Exam Vitals reviewed.  HENT:     Nose: Nose normal.     Mouth/Throat:     Mouth: Mucous membranes are moist.  Eyes:     General: No scleral icterus.    Conjunctiva/sclera: Conjunctivae normal.  Cardiovascular:     Rate and Rhythm: Normal rate and regular rhythm.     Heart sounds: No murmur heard.   Pulmonary:     Effort: Pulmonary effort is normal.     Breath sounds: No stridor. No wheezing, rhonchi or rales.  Abdominal:     General: Abdomen is protuberant. Bowel sounds are normal. There is no distension.     Palpations: Abdomen is soft. There is no hepatomegaly, splenomegaly or mass.     Tenderness: There is no abdominal tenderness.  Musculoskeletal:        General: Normal range of motion.     Cervical back: Neck supple.     Right lower leg: No edema.     Left lower leg: No edema.  Lymphadenopathy:     Cervical: No cervical adenopathy.  Skin:     General: Skin is warm and dry.  Neurological:     General: No focal deficit present.     Mental Status: She is alert.  Psychiatric:        Attention and Perception: Attention normal.        Mood and Affect: Affect normal. Mood is anxious. Mood is not elated. Affect is not angry.        Speech: Speech normal.        Behavior: Behavior normal. Behavior is cooperative.        Thought Content: Thought content normal.        Cognition and Memory:  Cognition normal.     Lab Results  Component Value Date   WBC 7.8 01/11/2020   HGB 10.3 (L) 01/11/2020   HCT 30.7 (L) 01/11/2020   PLT 321.0 01/11/2020   GLUCOSE 85 11/09/2019   CHOL 169 09/09/2019   TRIG 443.0 (H) 01/11/2020   HDL 32.10 (L) 09/09/2019   LDLDIRECT 72.0 09/09/2019   LDLCALC 68 07/30/2013   ALT 22 11/09/2019   AST 16 11/09/2019   NA 136 11/09/2019   K 3.9 11/09/2019   CL 98 11/09/2019   CREATININE 0.90 11/09/2019   BUN 12 11/09/2019   CO2 27 11/09/2019   TSH 1.73 09/09/2019   INR 1.04 01/14/2013   HGBA1C 6.9 (H) 01/11/2020   MICROALBUR 24.9 (H) 09/09/2019    MM 3D SCREEN BREAST BILATERAL  Result Date: 12/03/2019 CLINICAL DATA:  Screening. EXAM: DIGITAL SCREENING BILATERAL MAMMOGRAM WITH TOMO AND CAD COMPARISON:  Previous exam(s). ACR Breast Density Category b: There are scattered areas of fibroglandular density. FINDINGS: There are no findings suspicious for malignancy. Images were processed with CAD. IMPRESSION: No mammographic evidence of malignancy. A result letter of this screening mammogram will be mailed directly to the patient. RECOMMENDATION: Screening mammogram in one year. (Code:SM-B-01Y) BI-RADS CATEGORY  1: Negative. Electronically Signed   By: David  Ormond M.D.   On: 12/03/2019 12:10    Assessment & Plan:   Girtie was seen today for hyperlipidemia, hypertension and diabetes.  Diagnoses and all orders for this visit:  Type 2 diabetes mellitus with complication, without long-term current use of insulin  (HCC)- Her A1c is at 6.9%.  Her blood sugar is adequately well controlled. -     Cancel: Hemoglobin A1c; Future -     Hemoglobin A1c; Future  Anemia due to acquired thiamine deficiency- Her H&H are stable.  Will continue the thiamine supplement. -     Cancel: CBC with Differential/Platelet; Future -     CBC with Differential/Platelet; Future  Hypertriglyceridemia- Her triglycerides remain elevated.  I recommended that she continue to improve her lifestyle modifications and to be compliant with the omega-3 fish oil supplement. -     Cancel: Triglycerides; Future -     Triglycerides; Future -     omega-3 acid ethyl esters (LOVAZA) 1 g capsule; Take 2 capsules (2 g total) by mouth 2 (two) times daily.  Generalized anxiety disorder with panic attacks -     clonazePAM (KLONOPIN) 1 MG tablet; Take 1 tablet (1 mg total) by mouth at bedtime.  Other orders -     Flu Vaccine QUAD 6+ mos PF IM (Fluarix Quad PF) -     Varicella-zoster vaccine IM (Shingrix)   I am having Lyndsey H. Cataldo start on clonazePAM. I am also having her maintain her glucose blood, glucose blood, OneTouch Verio IQ System, blood glucose meter kit and supplies, EPINEPHrine, OneTouch Delica Plus Lancet33G, BD Pen Needle Micro U/F, Fexofenadine HCl (ALLEGRA ALLERGY PO), Diclofenac Sodium, triamterene-hydrochlorothiazide, Rybelsus, Nurtec, fluticasone, telmisartan, simvastatin, ondansetron, cloNIDine, linaclotide, omeprazole, labetalol, gabapentin, tizanidine, and omega-3 acid ethyl esters.  Meds ordered this encounter  Medications  . clonazePAM (KLONOPIN) 1 MG tablet    Sig: Take 1 tablet (1 mg total) by mouth at bedtime.    Dispense:  90 tablet    Refill:  1  . omega-3 acid ethyl esters (LOVAZA) 1 g capsule    Sig: Take 2 capsules (2 g total) by mouth 2 (two) times daily.    Dispense:  360 capsule    Refill:    1     Follow-up: Return in about 6 months (around 07/10/2020).  Scarlette Calico, MD

## 2020-01-12 MED ORDER — OMEGA-3-ACID ETHYL ESTERS 1 G PO CAPS: 2.0000 g | ORAL_CAPSULE | Freq: Two times a day (BID) | ORAL | 1 refills | Status: DC

## 2020-01-21 ENCOUNTER — Encounter: Payer: Self-pay | Admitting: Family Medicine

## 2020-02-10 ENCOUNTER — Encounter: Payer: Self-pay | Admitting: Family Medicine

## 2020-02-11 ENCOUNTER — Other Ambulatory Visit: Payer: Self-pay | Admitting: Internal Medicine

## 2020-02-11 DIAGNOSIS — K2100 Gastro-esophageal reflux disease with esophagitis, without bleeding: Secondary | ICD-10-CM

## 2020-02-12 ENCOUNTER — Other Ambulatory Visit: Payer: Self-pay | Admitting: Family

## 2020-02-12 DIAGNOSIS — I10 Essential (primary) hypertension: Secondary | ICD-10-CM
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
## 2020-02-23 ENCOUNTER — Encounter: Payer: Self-pay | Admitting: Family Medicine

## 2020-02-23 ENCOUNTER — Other Ambulatory Visit: Payer: Self-pay

## 2020-02-23 ENCOUNTER — Ambulatory Visit (INDEPENDENT_AMBULATORY_CARE_PROVIDER_SITE_OTHER): Payer: No Typology Code available for payment source | Admitting: Family Medicine

## 2020-02-23 VITALS — BP 100/80 | HR 81 | Ht 64.0 in | Wt 186.0 lb

## 2020-02-23 DIAGNOSIS — M999 Biomechanical lesion, unspecified: Secondary | ICD-10-CM | POA: Diagnosis not present

## 2020-02-23 DIAGNOSIS — M542 Cervicalgia: Secondary | ICD-10-CM | POA: Insufficient documentation

## 2020-02-23 NOTE — Assessment & Plan Note (Signed)
Chronic with mild exacerbation discussed zaanflex at night Continue to work on posture, will get standing desk  RTC in 4-6 weeks

## 2020-02-23 NOTE — Patient Instructions (Addendum)
Good to see you Stay active zanaflex at night when you need it Ok to try the patches See em again in 5-7 weeks

## 2020-02-24 ENCOUNTER — Other Ambulatory Visit: Payer: Self-pay | Admitting: Internal Medicine

## 2020-02-24 DIAGNOSIS — I10 Essential (primary) hypertension: Secondary | ICD-10-CM

## 2020-03-02 IMAGING — CT CT RENAL STONE PROTOCOL
2 of 4 series · 15 of 46 positions shown, 17 images · non-contrast
Comparison: 06/12/2010

CLINICAL DATA: Bilateral back and flank pain for 2 weeks. Concern
for nephrolithiasis.

EXAM:
CT ABDOMEN AND PELVIS WITHOUT CONTRAST
TECHNIQUE: Multidetector CT imaging of the abdomen and pelvis was performed
following the standard protocol without IV contrast.

[Series 2: stone study 5.0 i30f 1 · axial · 0.76mm/px · z∈[-450,-55]mm · 12 of 95 slices shown, 14 images]
[im 8/95  soft-tissue]
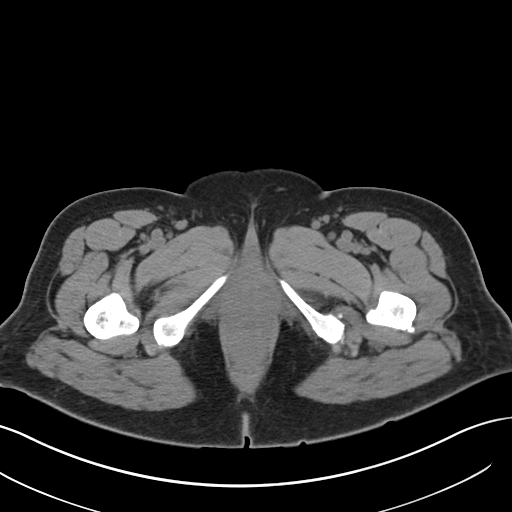
[im 8/95  bone]
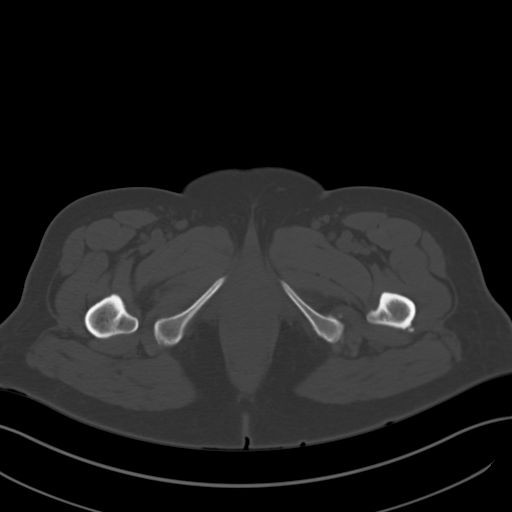
[im 15/95  soft-tissue]
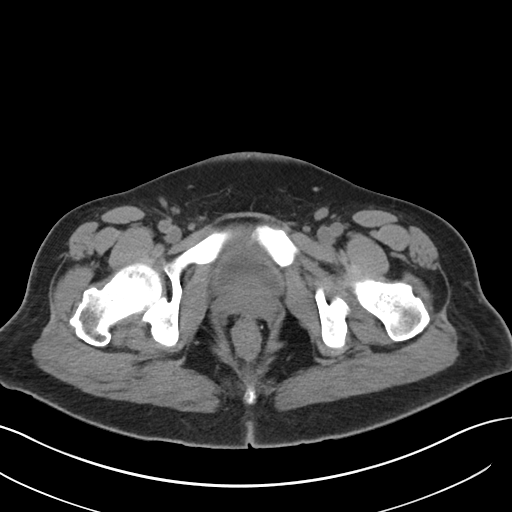
[im 22/95  soft-tissue]
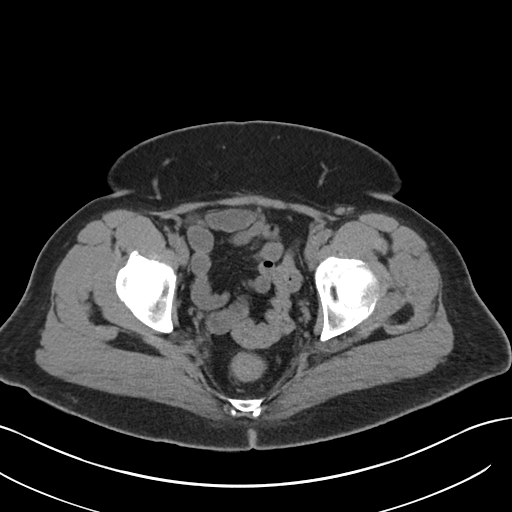
[im 29/95  soft-tissue]
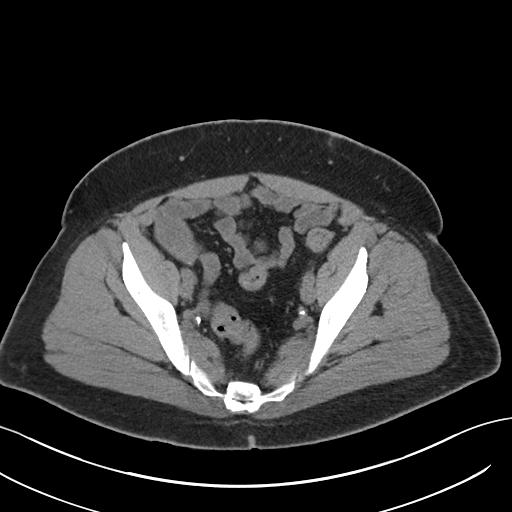
[im 37/95  soft-tissue]
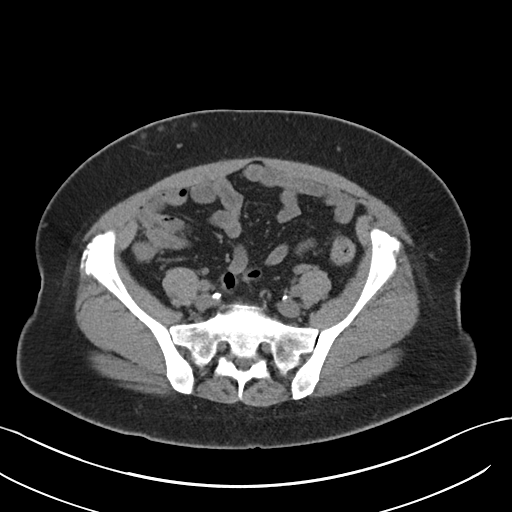
[im 44/95  soft-tissue]
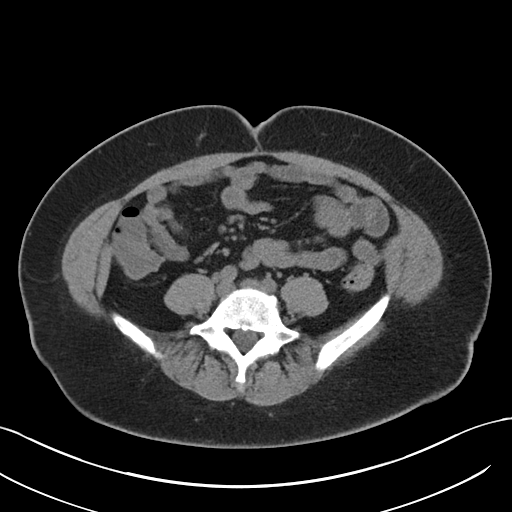
[im 51/95  soft-tissue]
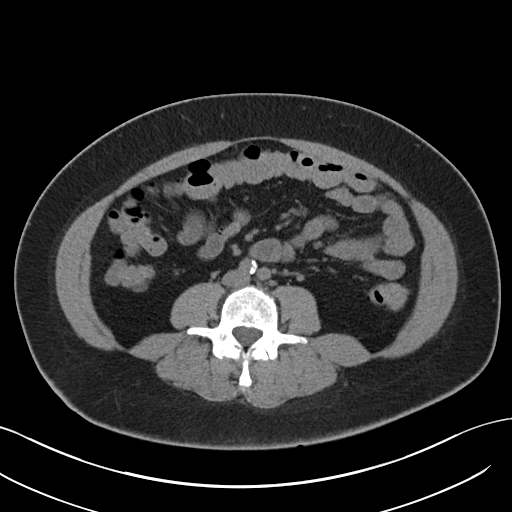
[im 58/95  soft-tissue]
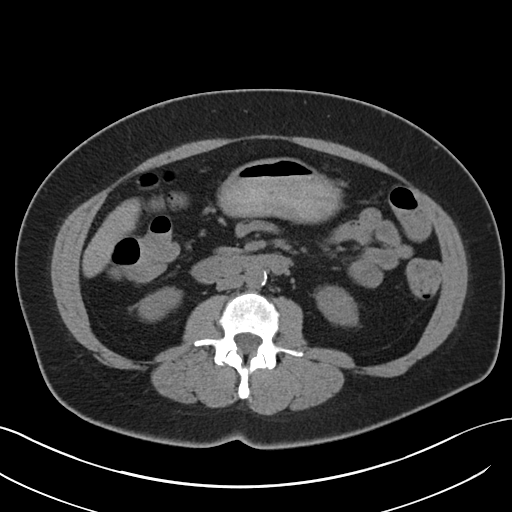
[im 66/95  soft-tissue]
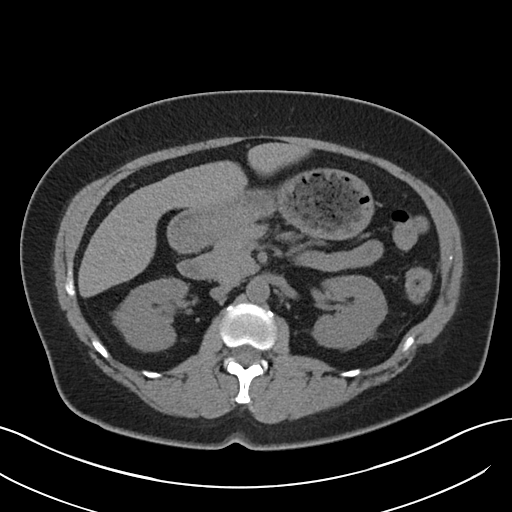
[im 66/95  bone]
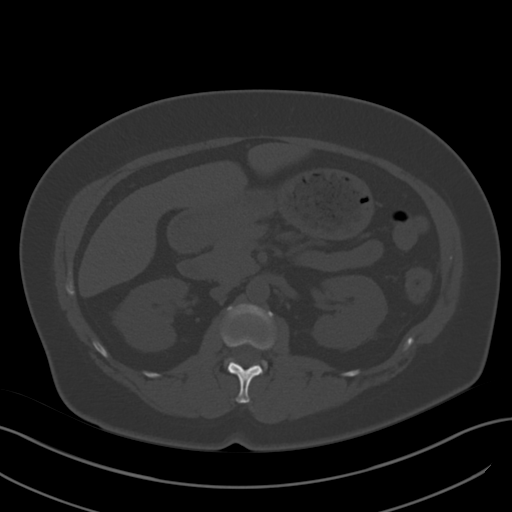
[im 73/95  soft-tissue]
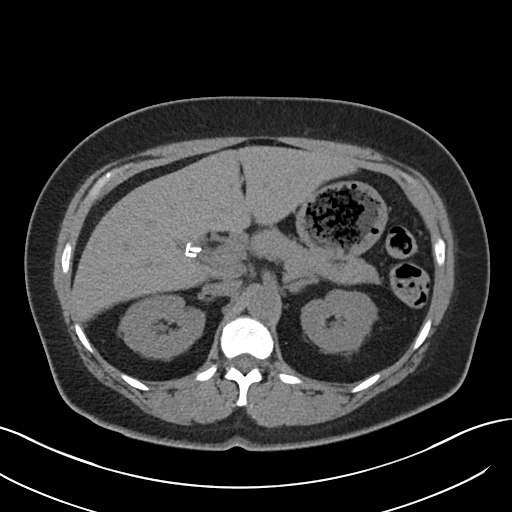
[im 80/95  soft-tissue]
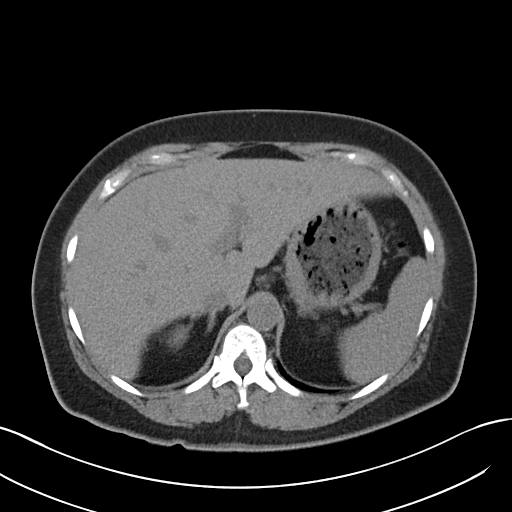
[im 87/95  soft-tissue]
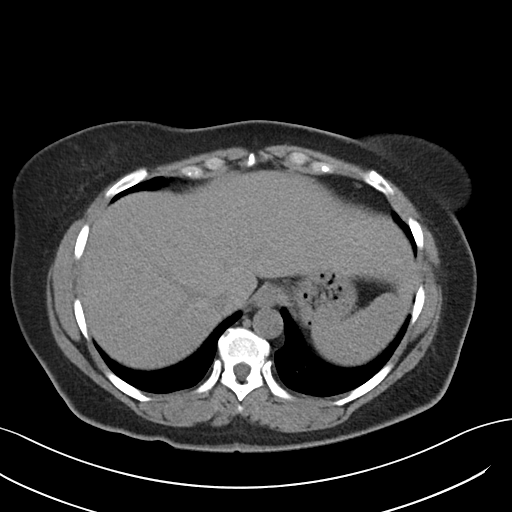

[Series 5: coronal soft tissue · coronal · 0.68mm/px · 3 of 89 slices shown]
[im 30/89  soft-tissue]
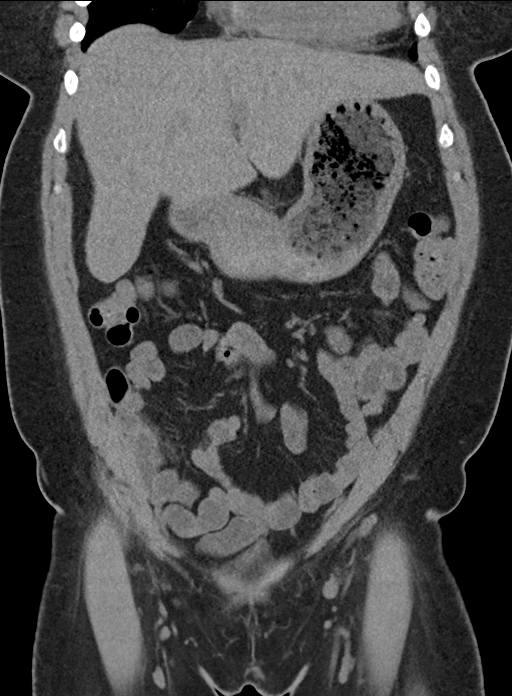
[im 40/89  soft-tissue]
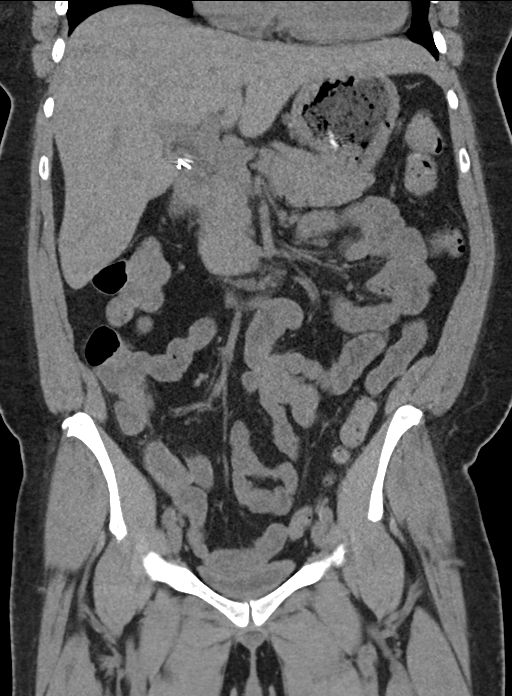
[im 49/89  soft-tissue]
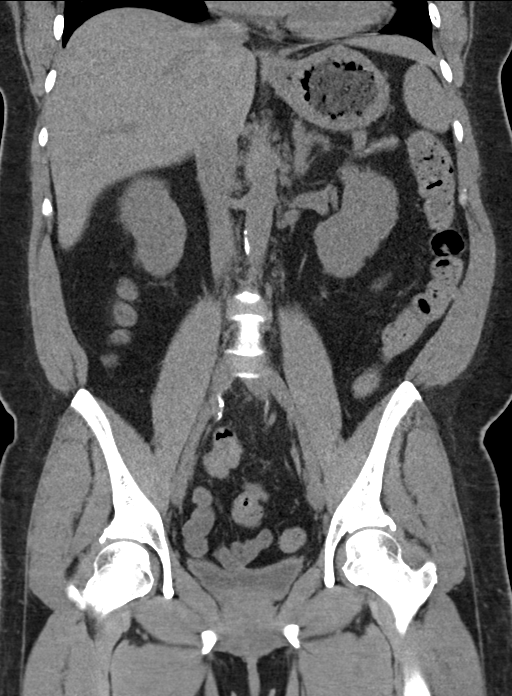

[15 of 46 positions shown; findings below may reference images not displayed]

FINDINGS: Lower chest: No acute abnormality.

Hepatobiliary: No focal liver abnormality is seen. No gallstones,
gallbladder wall thickening, or biliary dilatation.

Pancreas: Unremarkable. No pancreatic ductal dilatation or
surrounding inflammatory changes.

Spleen: Normal in size without focal abnormality.

Adrenals/Urinary Tract: Normal adrenal glands. Minor nonspecific
perinephric strandy edema without obstruction, hydronephrosis,
hydroureter, or obstructing ureteral calculus. Urinary bladder under
distended but no significant abnormality by noncontrast CT.

Stomach/Bowel: Negative for bowel obstruction, significant
dilatation, ileus, or free air. Normal appendix demonstrated. No
fluid collection, ascites, hemorrhage, or abscess.

Vascular/Lymphatic: Aortic atherosclerosis present. Negative for
aneurysm. No adenopathy.

Reproductive: Status post hysterectomy. No adnexal masses.

Other: Intact abdominal wall.  No hernia.  No ascites.

Right upper quadrant anterior omental calcified nodule has
well-circumscribed margins and no significant gross interval change
compared 0820. This measures 15 x 16 mm, image 36 series 2 and
favored to represent a chronic calcified omental infarct. This
appears adjacent to but separate from the hepatic flexure.

Musculoskeletal: Degenerative changes noted of the spine SI joints.
No acute osseous finding.
IMPRESSION: No acute intra-abdominopelvic finding by noncontrast CT.

No acute obstructing urinary tract or ureteral calculus. No acute
obstructive uropathy.

Aortic atherosclerosis without aneurysm

Remote cholecystectomy and hysterectomy

Right upper quadrant calcified anterior omental nodule favored to
represent chronic omental infarct, little interval change compared

## 2020-03-07 ENCOUNTER — Ambulatory Visit (INDEPENDENT_AMBULATORY_CARE_PROVIDER_SITE_OTHER): Payer: No Typology Code available for payment source | Admitting: Internal Medicine

## 2020-03-07 ENCOUNTER — Other Ambulatory Visit: Payer: Self-pay

## 2020-03-07 ENCOUNTER — Encounter: Payer: Self-pay | Admitting: Internal Medicine

## 2020-03-07 VITALS — BP 126/86 | HR 84 | Temp 98.1°F | Resp 16 | Ht 64.0 in | Wt 185.0 lb

## 2020-03-07 DIAGNOSIS — I10 Essential (primary) hypertension: Secondary | ICD-10-CM

## 2020-03-07 DIAGNOSIS — Z23 Encounter for immunization: Secondary | ICD-10-CM

## 2020-03-07 MED ORDER — INDAPAMIDE 1.25 MG PO TABS: 1.2500 mg | ORAL_TABLET | Freq: Every day | ORAL | 0 refills | Status: DC

## 2020-03-07 NOTE — Patient Instructions (Signed)

## 2020-03-07 NOTE — Progress Notes (Signed)
Subjective:  Patient ID: Carolyn [name], female    DOB: March 05, 1961  Age: 59 y.o. MRN: [id number]  CC: Hypertension  This visit occurred during the SARS-CoV-2 public health emergency.  Safety protocols were in place, including screening questions prior to the visit, additional usage of staff PPE, and extensive cleaning of exam room while observing appropriate contact time as indicated for disinfecting solutions.    HPI Carolyn [name] presents for f/up - She has seen a couple of other medical providers recently and was told that her systolic blood pressure is too low.  She admits that she has had a few episodes of dizziness and lightheadedness.  She tells me she is taking all of the listed antihypertensives.  Outpatient Medications Prior to Visit  Medication Sig Dispense Refill  . BD PEN NEEDLE MICRO U/F 32G X 6 MM MISC USE 1 DAILY 100 each 3  . blood glucose meter kit and supplies KIT Dispense based on patient and insurance preference. Use up to four times daily as directed. (FOR ICD-9 250.00, 250.01). 1 each 0  . Blood Glucose Monitoring Suppl (ONETOUCH VERIO IQ SYSTEM) W/DEVICE KIT 1 Act by Does not apply route 3 (three) times daily. 2 kit 0  . clonazePAM (KLONOPIN) 1 MG tablet Take 1 tablet (1 mg total) by mouth at bedtime. 90 tablet 1  . cloNIDine (CATAPRES - DOSED IN MG/24 HR) 0.1 mg/24hr patch PLACE 1 PATCH (0.1 MG TOTAL) ONTO THE SKIN ONCE A WEEK 12 patch 0  . Diclofenac Sodium 2 % SOLN Place 2 g onto the skin 2 (two) times daily. 112 g 3  . EPINEPHrine 0.3 mg/0.3 mL IJ SOAJ injection INJECT 0.3 MLS INTO THE MUSCLE ONCE FOR 1 DOSE  0  . Fexofenadine HCl (ALLEGRA ALLERGY PO) Take by mouth.    . fluticasone (FLONASE) 50 MCG/ACT nasal spray Place 2 sprays into both nostrils daily. 16 g 6  . gabapentin (NEURONTIN) 100 MG capsule Take 2 capsules (200 mg total) by mouth at bedtime. 180 capsule 3  . glucose blood (ONE TOUCH ULTRA TEST) test strip Use to test blood sugar three times a week  and prn if having symptoms of low blood sugar Dx: 250.92 90 day supply 100 each 11  . glucose blood (ONETOUCH VERIO) test strip Use TID 100 each 12  . labetalol (NORMODYNE) 300 MG tablet Take 1 tablet (300 mg total) by mouth 2 (two) times daily. 180 tablet 2  . Lancets (ONETOUCH DELICA PLUS [id number]) MISC USE UP TO 4 TIMES DAILY AS DIRECTED  1  . linaclotide (LINZESS) 145 MCG CAPS capsule Take 1 capsule (145 mcg total) by mouth daily before breakfast. 90 capsule 0  . omega-3 acid ethyl esters (LOVAZA) 1 g capsule Take 2 capsules (2 g total) by mouth 2 (two) times daily. 360 capsule 1  . omeprazole (PRILOSEC) 40 MG capsule TAKE 1 CAPSULE BY MOUTH TWICE DAILY AT  8  AM  AND  10  PM 180 capsule 0  . ondansetron (ZOFRAN) 4 MG tablet Take 1 tablet (4 mg total) by mouth every 8 (eight) hours as needed for nausea or vomiting. 30 tablet 4  . Rimegepant Sulfate (NURTEC) 75 MG TBDP Take 1 tablet by mouth daily as needed. 8 tablet 0  . Semaglutide (RYBELSUS) 14 MG TABS Take 1 tablet by mouth daily. 90 tablet 1  . simvastatin (ZOCOR) 40 MG tablet Take 1 tablet (40 mg total) by mouth at bedtime. 90 tablet 1  . telmisartan (  MICARDIS) 80 MG tablet Take 1 tablet (80 mg total) by mouth daily. 90 tablet 0  . tizanidine (ZANAFLEX) 2 MG capsule Take 1 capsule (2 mg total) by mouth 3 (three) times daily. 30 capsule 3  . triamterene-hydrochlorothiazide (DYAZIDE) 37.5-25 MG capsule Take 1 capsule by mouth once daily 90 capsule 1   No facility-administered medications prior to visit.    ROS Review of Systems  Constitutional: Negative for appetite change, diaphoresis, fatigue and unexpected weight change.  HENT: Negative.   Eyes: Negative for visual disturbance.  Respiratory: Negative for cough, chest tightness, shortness of breath and wheezing.   Cardiovascular: Negative for chest pain, palpitations and leg swelling.  Gastrointestinal: Negative for abdominal pain, diarrhea, nausea and vomiting.  Endocrine:  Negative.   Genitourinary: Negative.  Negative for difficulty urinating and dysuria.  Musculoskeletal: Negative.   Skin: Negative.   Neurological: Positive for dizziness and light-headedness. Negative for weakness.  Hematological: Negative for adenopathy. Does not bruise/bleed easily.  Psychiatric/Behavioral: Negative.     Objective:  BP 126/86   Pulse 84   Temp 98.1 F (36.7 C) (Oral)   Resp 16   Ht 5' 4" (1.626 m)   Wt 185 lb (83.9 kg)   SpO2 97%   BMI 31.76 kg/m   BP Readings from Last 3 Encounters:  03/07/20 126/86  02/23/20 100/80  01/11/20 128/82    Wt Readings from Last 3 Encounters:  03/07/20 185 lb (83.9 kg)  02/23/20 186 lb (84.4 kg)  01/11/20 187 lb (84.8 kg)    Physical Exam  Lab Results  Component Value Date   WBC 7.8 01/11/2020   HGB 10.3 (L) 01/11/2020   HCT 30.7 (L) 01/11/2020   PLT 321.0 01/11/2020   GLUCOSE 85 11/09/2019   CHOL 169 09/09/2019   TRIG 443.0 (H) 01/11/2020   HDL 32.10 (L) 09/09/2019   LDLDIRECT 72.0 09/09/2019   LDLCALC 68 07/30/2013   ALT 22 11/09/2019   AST 16 11/09/2019   NA 136 11/09/2019   K 3.9 11/09/2019   CL 98 11/09/2019   CREATININE 0.90 11/09/2019   BUN 12 11/09/2019   CO2 27 11/09/2019   TSH 1.73 09/09/2019   INR 1.04 01/14/2013   HGBA1C 6.9 (H) 01/11/2020   MICROALBUR 24.9 (H) 09/09/2019    MM 3D SCREEN BREAST BILATERAL  Result Date: 12/03/2019 CLINICAL DATA:  Screening. EXAM: DIGITAL SCREENING BILATERAL MAMMOGRAM WITH TOMO AND CAD COMPARISON:  Previous exam(s). ACR Breast Density Category b: There are scattered areas of fibroglandular density. FINDINGS: There are no findings suspicious for malignancy. Images were processed with CAD. IMPRESSION: No mammographic evidence of malignancy. A result letter of this screening mammogram will be mailed directly to the patient. RECOMMENDATION: Screening mammogram in one year. (Code:SM-B-01Y) BI-RADS CATEGORY  1: Negative. Electronically Signed   By: Lajean Manes M.D.    On: 12/03/2019 12:10    Assessment & Plan:   Carolyn [name] was seen today for hypertension.  Diagnoses and all orders for this visit:  Essential hypertension- Her blood pressure is overcontrolled.  I recommended she discontinue triamterene/hydrochlorothiazide and to consolidate her diuretic regimen into indapamide.  She will continue the current doses of clonidine, telmisartan, and labetalol. -     indapamide (LOZOL) 1.25 MG tablet; Take 1 tablet (1.25 mg total) by mouth daily.  Other orders -     Pneumococcal conjugate vaccine 13-valent   I have discontinued Findley H. Gomez's triamterene-hydrochlorothiazide. I am also having her start on indapamide. Additionally, I am having her maintain  her glucose blood, glucose blood, OneTouch Verio IQ System, blood glucose meter kit and supplies, EPINEPHrine, OneTouch Delica Plus [id number], BD Pen Needle Micro U/F, Fexofenadine HCl (ALLEGRA ALLERGY PO), Diclofenac Sodium, Rybelsus, Nurtec, fluticasone, telmisartan, simvastatin, ondansetron, linaclotide, labetalol, gabapentin, tizanidine, clonazePAM, omega-3 acid ethyl esters, omeprazole, and cloNIDine.  Meds ordered this encounter  Medications  . indapamide (LOZOL) 1.25 MG tablet    Sig: Take 1 tablet (1.25 mg total) by mouth daily.    Dispense:  90 tablet    Refill:  0     Follow-up: Return in about 3 months (around 06/07/2020).  Scarlette Calico, MD

## 2020-03-12 ENCOUNTER — Other Ambulatory Visit: Payer: Self-pay | Admitting: Internal Medicine

## 2020-03-12 DIAGNOSIS — E118 Type 2 diabetes mellitus with unspecified complications: Secondary | ICD-10-CM

## 2020-03-22 ENCOUNTER — Other Ambulatory Visit: Payer: Self-pay | Admitting: Internal Medicine

## 2020-03-22 DIAGNOSIS — K5904 Chronic idiopathic constipation: Secondary | ICD-10-CM

## 2020-03-31 ENCOUNTER — Telehealth: Payer: Self-pay

## 2020-03-31 NOTE — Telephone Encounter (Signed)
RYBELSUS TAB 7MG  is approved through 03/31/2021

## 2020-03-31 NOTE — Telephone Encounter (Signed)
Key: [id number]

## 2020-04-05 NOTE — Progress Notes (Signed)
[place] Phone: [phone] Subjective:   [name], am serving as a scribe for Dr. Hulan Saas. This visit occurred during the SARS-CoV-2 public health emergency.  Safety protocols were in place, including screening questions prior to the visit, additional usage of staff PPE, and extensive cleaning of exam room while observing appropriate contact time as indicated for disinfecting solutions.   I'm seeing this patient by the request  of:  [name], MD  CC: Left arm pain  [id number]  Carolyn [name] is a 59 y.o. female coming in with complaint of back and neck pain. OMT 02/23/2020. Patient states that she is having left arm pain. Woke up in middle of the night 2 days ago with pain. Pain with using keyboard at work. Has tried to modify work positions.  Patient does not know what it is.  Does not remember any true injury.  Rates the severity of pain though as 8 out of 10 when it occurred initially but some mild improvement since then.  Denies any radiation and goes to the hand.  Medications patient has been prescribed: Zanaflex, Gabapentin   Taking:    Patient has had x-rays of the neck that has shown degenerative disc disease at multiple levels.     Reviewed prior external information including notes and imaging from previsou exam, outside providers and external EMR if available.   As well as notes that were available from care everywhere and other healthcare systems.  Past medical history, social, surgical and family history all reviewed in electronic medical record.  No pertanent information unless stated regarding to the chief complaint.   Past Medical History:  Diagnosis Date  . Allergy    seasonal  . Anemia    Anemia of chronic disease suspected  . Anxiety attack   . Arthritis   . Benign fundic gland polyps of stomach   . Dyslipidemia   . Exertional shortness of breath   . GERD  (gastroesophageal reflux disease)   . Heart murmur   . Hyperlipidemia   . Hypertension   . Migraines    "probably weekly" (01/14/2013)  . Type II diabetes mellitus ([facility])     Allergies  Allergen Reactions  . Invokana [Canagliflozin] Other (See Comments)    YEAST INFECTIONS  . Codeine Nausea And Vomiting  . Flexeril [Cyclobenzaprine]     sedation  . Morphine And Related Itching  . Reglan [Metoclopramide] Other (See Comments)    "paralyzes me"  . Silicon     In watch bands     Review of Systems:  No headache, visual changes, nausea, vomiting, diarrhea, constipation, dizziness, abdominal pain, skin rash, fevers, chills, night sweats, weight loss, swollen lymph nodes, body aches, joint swelling, chest pain, shortness of breath, mood changes. POSITIVE muscle aches  Objective  Blood pressure 118/86, pulse 85, height 5\' 4"  (1.626 m), weight 168 lb (76.2 kg), SpO2 96 %.   General: No apparent distress alert and oriented x3 mood and affect normal, dressed appropriately.  HEENT: Pupils equal, extraocular movements intact  Respiratory: Patient's speak in full sentences and does not appear short of breath  Cardiovascular: No lower extremity edema, non tender, no erythema  MSK: Left shoulder exam does have full range of motion.  Rotator cuff strength does seem to be intact but patient does have pain with empty can.  Patient does have mild impingement with [place].  Patient is neurovascularly intact distally.  Tightness  of the neck noted as well.  Negative Spurling's though but did have worsening pain with extension of the neck.  Shoulder was much more irritated after range of motion testing.        Assessment and Plan:       The above documentation has been reviewed and is accurate and complete Lyndal Pulley, DO       Note: This dictation was prepared with Dragon dictation along with smaller phrase technology. Any transcriptional errors that result from this process are  unintentional.

## 2020-04-06 ENCOUNTER — Encounter: Payer: Self-pay | Admitting: Family Medicine

## 2020-04-06 ENCOUNTER — Other Ambulatory Visit: Payer: Self-pay

## 2020-04-06 ENCOUNTER — Ambulatory Visit (INDEPENDENT_AMBULATORY_CARE_PROVIDER_SITE_OTHER): Payer: No Typology Code available for payment source | Admitting: Family Medicine

## 2020-04-06 DIAGNOSIS — M79602 Pain in left arm: Secondary | ICD-10-CM | POA: Insufficient documentation

## 2020-04-06 NOTE — Patient Instructions (Signed)
Voltaren gel  OK to take 2 aleve 2x a day for next 3 days Ice after activity and before bed Same exercises for neck Increase gabapentin to 3 pills for next 3-5 nights See me in 5 weeks if not perfect or if you need OMT

## 2020-04-06 NOTE — Assessment & Plan Note (Addendum)
Difficult to assess.  Patient may have some mild subacromial bursitis that could be contributing.  Differential also includes the cervical radiculopathy.  Increase gabapentin to 300 mg, discussed icing regimen and home exercises.  Patient given what different ones and we discussed ergonomics at work that I think will be beneficial.  Patient wanted to try conservative therapy and declined injections or further evaluation.  With it only being 2 days she would like to give it more time.  Patient also declined prednisone with patient being a diabetic.  Follow-up with me again 6 weeks.  Worsening pain would like to consider ultrasound x-rays and physical therapy.  Patient is not having any chest pain and no significant pain at rest.  Patient knows if that worsens to seek medical attention immediately but this does appear musculoskeletal.

## 2020-04-19 ENCOUNTER — Encounter: Payer: Self-pay | Admitting: Internal Medicine

## 2020-04-26 NOTE — Telephone Encounter (Signed)
Patient calling back about the status of this message, does patient need to make a virtual appt?

## 2020-04-27 NOTE — Telephone Encounter (Signed)
Patient found out her apartment had not changed the air filter, it was black when they took it out. She is now wondering if there is anything she needs to do because she has been inhaling that for so long.

## 2020-05-05 ENCOUNTER — Other Ambulatory Visit: Payer: Self-pay | Admitting: Internal Medicine

## 2020-05-05 DIAGNOSIS — E785 Hyperlipidemia, unspecified: Secondary | ICD-10-CM

## 2020-05-08 ENCOUNTER — Telehealth: Payer: Self-pay | Admitting: Internal Medicine

## 2020-05-08 ENCOUNTER — Ambulatory Visit (INDEPENDENT_AMBULATORY_CARE_PROVIDER_SITE_OTHER): Payer: No Typology Code available for payment source | Admitting: Internal Medicine

## 2020-05-08 ENCOUNTER — Other Ambulatory Visit: Payer: Self-pay | Admitting: Internal Medicine

## 2020-05-08 ENCOUNTER — Other Ambulatory Visit: Payer: Self-pay

## 2020-05-08 ENCOUNTER — Encounter: Payer: Self-pay | Admitting: Internal Medicine

## 2020-05-08 VITALS — BP 142/86 | HR 94 | Temp 98.6°F | Resp 16 | Ht 64.0 in | Wt 184.0 lb

## 2020-05-08 DIAGNOSIS — J3089 Other allergic rhinitis: Secondary | ICD-10-CM

## 2020-05-08 DIAGNOSIS — J0100 Acute maxillary sinusitis, unspecified: Secondary | ICD-10-CM

## 2020-05-08 DIAGNOSIS — I10 Essential (primary) hypertension: Secondary | ICD-10-CM | POA: Diagnosis not present

## 2020-05-08 DIAGNOSIS — Z23 Encounter for immunization: Secondary | ICD-10-CM | POA: Diagnosis not present

## 2020-05-08 MED ORDER — AMOXICILLIN-POT CLAVULANATE 875-125 MG PO TABS: 1.0000 | ORAL_TABLET | Freq: Two times a day (BID) | ORAL | 0 refills | Status: AC

## 2020-05-08 MED ORDER — METHYLPREDNISOLONE 4 MG PO TBPK: ORAL_TABLET | ORAL | 0 refills | Status: AC

## 2020-05-08 MED ORDER — METHYLPREDNISOLONE 4 MG PO TBPK: ORAL_TABLET | ORAL | 0 refills | Status: DC

## 2020-05-08 MED ORDER — INDAPAMIDE 1.25 MG PO TABS: 1.2500 mg | ORAL_TABLET | Freq: Every day | ORAL | 0 refills | Status: DC

## 2020-05-08 MED ORDER — AMOXICILLIN-POT CLAVULANATE 875-125 MG PO TABS: 1.0000 | ORAL_TABLET | Freq: Two times a day (BID) | ORAL | 0 refills | Status: DC

## 2020-05-08 NOTE — Progress Notes (Signed)
Subjective:  Patient ID: Carolyn [name], female    DOB: 1960/09/20  Age: 60 y.o. MRN: [id number]  CC: URI  This visit occurred during the SARS-CoV-2 public health emergency.  Safety protocols were in place, including screening questions prior to the visit, additional usage of staff PPE, and extensive cleaning of exam room while observing appropriate contact time as indicated for disinfecting solutions.    HPI Carolyn [name] presents for f/up - She complains of a 3-week history of popping and pressure in her ears with nasal congestion and thick yellow-green nasal phlegm. She denies cough, fever, chills, night sweats, or facial pain.  Outpatient Medications Prior to Visit  Medication Sig Dispense Refill  . BD PEN NEEDLE MICRO U/F 32G X 6 MM MISC USE 1 DAILY 100 each 3  . blood glucose meter kit and supplies KIT Dispense based on patient and insurance preference. Use up to four times daily as directed. (FOR ICD-9 250.00, 250.01). 1 each 0  . Blood Glucose Monitoring Suppl (ONETOUCH VERIO IQ SYSTEM) W/DEVICE KIT 1 Act by Does not apply route 3 (three) times daily. 2 kit 0  . clonazePAM (KLONOPIN) 1 MG tablet Take 1 tablet (1 mg total) by mouth at bedtime. 90 tablet 1  . cloNIDine (CATAPRES - DOSED IN MG/24 HR) 0.1 mg/24hr patch PLACE 1 PATCH (0.1 MG TOTAL) ONTO THE SKIN ONCE A WEEK 12 patch 0  . Diclofenac Sodium 2 % SOLN Place 2 g onto the skin 2 (two) times daily. 112 g 3  . EPINEPHrine 0.3 mg/0.3 mL IJ SOAJ injection INJECT 0.3 MLS INTO THE MUSCLE ONCE FOR 1 DOSE  0  . Fexofenadine HCl (ALLEGRA ALLERGY PO) Take by mouth.    . fluticasone (FLONASE) 50 MCG/ACT nasal spray Place 2 sprays into both nostrils daily. 16 g 6  . gabapentin (NEURONTIN) 100 MG capsule Take 2 capsules (200 mg total) by mouth at bedtime. 180 capsule 3  . glucose blood (ONE TOUCH ULTRA TEST) test strip Use to test blood sugar three times a week and prn if having symptoms of low blood sugar Dx: 250.92 90 day supply 100  each 11  . glucose blood (ONETOUCH VERIO) test strip Use TID 100 each 12  . labetalol (NORMODYNE) 300 MG tablet Take 1 tablet (300 mg total) by mouth 2 (two) times daily. 180 tablet 2  . Lancets (ONETOUCH DELICA PLUS [id number]) MISC USE UP TO 4 TIMES DAILY AS DIRECTED  1  . LINZESS 145 MCG CAPS capsule TAKE 1 CAPSULE BY MOUTH ONCE DAILY BEFORE BREAKFAST 90 capsule 1  . omega-3 acid ethyl esters (LOVAZA) 1 g capsule Take 2 capsules (2 g total) by mouth 2 (two) times daily. 360 capsule 1  . omeprazole (PRILOSEC) 40 MG capsule TAKE 1 CAPSULE BY MOUTH TWICE DAILY AT  8  AM  AND  10  PM 180 capsule 0  . ondansetron (ZOFRAN) 4 MG tablet Take 1 tablet (4 mg total) by mouth every 8 (eight) hours as needed for nausea or vomiting. 30 tablet 4  . Rimegepant Sulfate (NURTEC) 75 MG TBDP Take 1 tablet by mouth daily as needed. 8 tablet 0  . RYBELSUS 14 MG TABS Take 1 tablet by mouth once daily 90 tablet 1  . simvastatin (ZOCOR) 40 MG tablet TAKE 1 TABLET BY MOUTH AT BEDTIME 90 tablet 1  . telmisartan (MICARDIS) 80 MG tablet Take 1 tablet (80 mg total) by mouth daily. 90 tablet 0  . tizanidine (ZANAFLEX) 2 MG  capsule Take 1 capsule (2 mg total) by mouth 3 (three) times daily. 30 capsule 3  . indapamide (LOZOL) 1.25 MG tablet Take 1 tablet (1.25 mg total) by mouth daily. 90 tablet 0   No facility-administered medications prior to visit.    ROS Review of Systems  Constitutional: Positive for unexpected weight change (wt gain). Negative for chills, diaphoresis, fatigue and fever.  HENT: Positive for congestion, postnasal drip and rhinorrhea. Negative for facial swelling, nosebleeds, sinus pressure, sore throat and trouble swallowing.   Eyes: Negative.   Respiratory: Negative for cough, chest tightness, shortness of breath and wheezing.   Cardiovascular: Negative for chest pain, palpitations and leg swelling.  Gastrointestinal: Negative for abdominal pain, constipation, diarrhea, nausea and vomiting.   Endocrine: Negative.   Genitourinary: Negative.  Negative for difficulty urinating.  Musculoskeletal: Negative.  Negative for arthralgias.  Skin: Negative.  Negative for pallor.  Neurological: Negative.  Negative for dizziness, weakness and light-headedness.  Hematological: Negative for adenopathy. Does not bruise/bleed easily.  Psychiatric/Behavioral: Negative.     Objective:  BP (!) 142/86   Pulse 94   Temp 98.6 F (37 C) (Oral)   Ht 5' 4" (1.626 m)   Wt 184 lb (83.5 kg)   SpO2 99%   BMI 31.58 kg/m   BP Readings from Last 3 Encounters:  05/09/20 122/80  05/08/20 (!) 142/86  04/06/20 118/86    Wt Readings from Last 3 Encounters:  05/09/20 184 lb (83.5 kg)  05/08/20 184 lb (83.5 kg)  04/06/20 168 lb (76.2 kg)    Physical Exam Vitals reviewed.  Constitutional:      Appearance: Normal appearance.  HENT:     Nose: Mucosal edema, congestion and rhinorrhea present. Rhinorrhea is purulent.     Right Nostril: No epistaxis.     Left Nostril: No epistaxis.     Right Sinus: No maxillary sinus tenderness or frontal sinus tenderness.     Left Sinus: No maxillary sinus tenderness or frontal sinus tenderness.     Mouth/Throat:     Mouth: Mucous membranes are moist.  Eyes:     General: No scleral icterus.    Conjunctiva/sclera: Conjunctivae normal.  Cardiovascular:     Rate and Rhythm: Normal rate and regular rhythm.     Pulses: Normal pulses.     Heart sounds: No murmur heard.   Pulmonary:     Effort: Pulmonary effort is normal.     Breath sounds: No stridor. No wheezing, rhonchi or rales.  Abdominal:     General: Abdomen is flat.     Palpations: There is no mass.     Tenderness: There is no abdominal tenderness. There is no guarding.  Musculoskeletal:        General: Normal range of motion.     Cervical back: Neck supple.     Right lower leg: No edema.     Left lower leg: No edema.  Lymphadenopathy:     Cervical: No cervical adenopathy.  Skin:    General: Skin  is warm and dry.  Neurological:     General: No focal deficit present.     Mental Status: She is alert.     Lab Results  Component Value Date   WBC 7.8 01/11/2020   HGB 10.3 (L) 01/11/2020   HCT 30.7 (L) 01/11/2020   PLT 321.0 01/11/2020   GLUCOSE 85 11/09/2019   CHOL 169 09/09/2019   TRIG 443.0 (H) 01/11/2020   HDL 32.10 (L) 09/09/2019   LDLDIRECT 72.0 09/09/2019  LDLCALC 68 07/30/2013   ALT 22 11/09/2019   AST 16 11/09/2019   NA 136 11/09/2019   K 3.9 11/09/2019   CL 98 11/09/2019   CREATININE 0.90 11/09/2019   BUN 12 11/09/2019   CO2 27 11/09/2019   TSH 1.73 09/09/2019   INR 1.04 01/14/2013   HGBA1C 6.9 (H) 01/11/2020   MICROALBUR 24.9 (H) 09/09/2019    MM 3D SCREEN BREAST BILATERAL  Result Date: 12/03/2019 CLINICAL DATA:  Screening. EXAM: DIGITAL SCREENING BILATERAL MAMMOGRAM WITH TOMO AND CAD COMPARISON:  Previous exam(s). ACR Breast Density Category b: There are scattered areas of fibroglandular density. FINDINGS: There are no findings suspicious for malignancy. Images were processed with CAD. IMPRESSION: No mammographic evidence of malignancy. A result letter of this screening mammogram will be mailed directly to the patient. RECOMMENDATION: Screening mammogram in one year. (Code:SM-B-01Y) BI-RADS CATEGORY  1: Negative. Electronically Signed   By: Lajean Manes M.D.   On: 12/03/2019 12:10    Assessment & Plan:   Glorya was seen today for uri.  Diagnoses and all orders for this visit:  Acute non-recurrent maxillary sinusitis -     Discontinue: amoxicillin-clavulanate (AUGMENTIN) 875-125 MG tablet; Take 1 tablet by mouth 2 (two) times daily for 10 days. -     amoxicillin-clavulanate (AUGMENTIN) 875-125 MG tablet; Take 1 tablet by mouth 2 (two) times daily for 10 days.  Non-seasonal allergic rhinitis, unspecified trigger -     Discontinue: methylPREDNISolone (MEDROL DOSEPAK) 4 MG TBPK tablet; TAKE AS DIRECTED -     methylPREDNISolone (MEDROL DOSEPAK) 4 MG TBPK  tablet; TAKE AS DIRECTED  Essential hypertension- Her BP is not well controlled. She will improve her lifestyle modifications. -     Discontinue: indapamide (LOZOL) 1.25 MG tablet; Take 1 tablet (1.25 mg total) by mouth daily.  Other orders -     Varicella-zoster vaccine IM (Shingrix)   I have discontinued Merriel H. Muhlbauer's indapamide. I am also having her maintain her glucose blood, glucose blood, OneTouch Verio IQ System, blood glucose meter kit and supplies, EPINEPHrine, OneTouch Delica Plus [id number], BD Pen Needle Micro U/F, Fexofenadine HCl (ALLEGRA ALLERGY PO), Diclofenac Sodium, Nurtec, fluticasone, telmisartan, ondansetron, labetalol, gabapentin, tizanidine, clonazePAM, omega-3 acid ethyl esters, omeprazole, cloNIDine, Rybelsus, Linzess, simvastatin, amoxicillin-clavulanate, and methylPREDNISolone.  Meds ordered this encounter  Medications  . DISCONTD: amoxicillin-clavulanate (AUGMENTIN) 875-125 MG tablet    Sig: Take 1 tablet by mouth 2 (two) times daily for 10 days.    Dispense:  20 tablet    Refill:  0  . DISCONTD: methylPREDNISolone (MEDROL DOSEPAK) 4 MG TBPK tablet    Sig: TAKE AS DIRECTED    Dispense:  21 tablet    Refill:  0  . amoxicillin-clavulanate (AUGMENTIN) 875-125 MG tablet    Sig: Take 1 tablet by mouth 2 (two) times daily for 10 days.    Dispense:  20 tablet    Refill:  0  . methylPREDNISolone (MEDROL DOSEPAK) 4 MG TBPK tablet    Sig: TAKE AS DIRECTED    Dispense:  21 tablet    Refill:  0  . DISCONTD: indapamide (LOZOL) 1.25 MG tablet    Sig: Take 1 tablet (1.25 mg total) by mouth daily.    Dispense:  90 tablet    Refill:  0     Follow-up: Return in about 3 weeks (around 05/29/2020).  Scarlette Calico, MD

## 2020-05-08 NOTE — Telephone Encounter (Signed)
Patient called and said that the pharmacy did not receive methylPREDNISolone (MEDROL DOSEPAK) 4 MG TBPK tablet, it can be sent to [place], [place] - [address] She also said that they did not receive indapamide (LOZOL) 1.25 MG tablet. She said because being put on amoxicillin-clavulanate (AUGMENTIN) 875-125 MG tablet she would need something for a yeast infection.       [place], [place] - [address] Phone:  [phone]  Fax:  [phone]

## 2020-05-08 NOTE — Patient Instructions (Signed)
Sinusitis, Adult Sinusitis is inflammation of your sinuses. Sinuses are hollow spaces in the bones around your face. Your sinuses are located:  Around your eyes.  In the middle of your forehead.  Behind your nose.  In your cheekbones. Mucus normally drains out of your sinuses. When your nasal tissues become inflamed or swollen, mucus can become trapped or blocked. This allows bacteria, viruses, and fungi to grow, which leads to infection. Most infections of the sinuses are caused by a virus. Sinusitis can develop quickly. It can last for up to 4 weeks (acute) or for more than 12 weeks (chronic). Sinusitis often develops after a cold. What are the causes? This condition is caused by anything that creates swelling in the sinuses or stops mucus from draining. This includes:  Allergies.  Asthma.  Infection from bacteria or viruses.  Deformities or blockages in your nose or sinuses.  Abnormal growths in the nose (nasal polyps).  Pollutants, such as chemicals or irritants in the air.  Infection from fungi (rare). What increases the risk? You are more likely to develop this condition if you:  Have a weak body defense system (immune system).  Do a lot of swimming or diving.  Overuse nasal sprays.  Smoke. What are the signs or symptoms? The main symptoms of this condition are pain and a feeling of pressure around the affected sinuses. Other symptoms include:  Stuffy nose or congestion.  Thick drainage from your nose.  Swelling and warmth over the affected sinuses.  Headache.  Upper toothache.  A cough that may get worse at night.  Extra mucus that collects in the throat or the back of the nose (postnasal drip).  Decreased sense of smell and taste.  Fatigue.  A fever.  Sore throat.  Bad breath. How is this diagnosed? This condition is diagnosed based on:  Your symptoms.  Your medical history.  A physical exam.  Tests to find out if your condition is  acute or chronic. This may include: ? Checking your nose for nasal polyps. ? Viewing your sinuses using a device that has a light (endoscope). ? Testing for allergies or bacteria. ? Imaging tests, such as an MRI or CT scan. In rare cases, a bone biopsy may be done to rule out more serious types of fungal sinus disease. How is this treated? Treatment for sinusitis depends on the cause and whether your condition is chronic or acute.  If caused by a virus, your symptoms should go away on their own within 10 days. You may be given medicines to relieve symptoms. They include: ? Medicines that shrink swollen nasal passages (topical intranasal decongestants). ? Medicines that treat allergies (antihistamines). ? A spray that eases inflammation of the nostrils (topical intranasal corticosteroids). ? Rinses that help get rid of thick mucus in your nose (nasal saline washes).  If caused by bacteria, your health care provider may recommend waiting to see if your symptoms improve. Most bacterial infections will get better without antibiotic medicine. You may be given antibiotics if you have: ? A severe infection. ? A weak immune system.  If caused by narrow nasal passages or nasal polyps, you may need to have surgery. Follow these instructions at home: Medicines  Take, use, or apply over-the-counter and prescription medicines only as told by your health care provider. These may include nasal sprays.  If you were prescribed an antibiotic medicine, take it as told by your health care provider. Do not stop taking the antibiotic even if you start   to feel better. Hydrate and humidify  Drink enough fluid to keep your urine pale yellow. Staying hydrated will help to thin your mucus.  Use a cool mist humidifier to keep the humidity level in your home above 50%.  Inhale steam for 10-15 minutes, 3-4 times a day, or as told by your health care provider. You can do this in the bathroom while a hot shower is  running.  Limit your exposure to cool or dry air.   Rest  Rest as much as possible.  Sleep with your head raised (elevated).  Make sure you get enough sleep each night. General instructions  Apply a warm, moist washcloth to your face 3-4 times a day or as told by your health care provider. This will help with discomfort.  Wash your hands often with soap and water to reduce your exposure to germs. If soap and water are not available, use hand sanitizer.  Do not smoke. Avoid being around people who are smoking (secondhand smoke).  Keep all follow-up visits as told by your health care provider. This is important.   Contact a health care provider if:  You have a fever.  Your symptoms get worse.  Your symptoms do not improve within 10 days. Get help right away if:  You have a severe headache.  You have persistent vomiting.  You have severe pain or swelling around your face or eyes.  You have vision problems.  You develop confusion.  Your neck is stiff.  You have trouble breathing. Summary  Sinusitis is soreness and inflammation of your sinuses. Sinuses are hollow spaces in the bones around your face.  This condition is caused by nasal tissues that become inflamed or swollen. The swelling traps or blocks the flow of mucus. This allows bacteria, viruses, and fungi to grow, which leads to infection.  If you were prescribed an antibiotic medicine, take it as told by your health care provider. Do not stop taking the antibiotic even if you start to feel better.  Keep all follow-up visits as told by your health care provider. This is important. This information is not intended to replace advice given to you by your health care provider. Make sure you discuss any questions you have with your health care provider. Document Revised: 09/01/2017 Document Reviewed: 09/01/2017 [employer] Patient Education  2021 [employer]  
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
## 2020-05-09 ENCOUNTER — Encounter: Payer: Self-pay | Admitting: Family Medicine

## 2020-05-09 ENCOUNTER — Ambulatory Visit (INDEPENDENT_AMBULATORY_CARE_PROVIDER_SITE_OTHER): Payer: No Typology Code available for payment source

## 2020-05-09 ENCOUNTER — Ambulatory Visit: Payer: Self-pay

## 2020-05-09 ENCOUNTER — Ambulatory Visit (INDEPENDENT_AMBULATORY_CARE_PROVIDER_SITE_OTHER): Payer: No Typology Code available for payment source | Admitting: Family Medicine

## 2020-05-09 VITALS — BP 122/80 | HR 86 | Ht 64.0 in | Wt 184.0 lb

## 2020-05-09 DIAGNOSIS — M79602 Pain in left arm: Secondary | ICD-10-CM | POA: Diagnosis not present

## 2020-05-09 NOTE — Assessment & Plan Note (Signed)
Patient continues to have left arm pain.  On ultrasound today it does appear that patient may have some mild degenerative tearing of the rotator cuff noted.  Patient given different choices versus potentially injection or formal physical therapy.  Patient at this time would like to continue with the home exercises.  We discussed icing regimen.  We discussed the role if this starts having any weakness or worsening pain that advanced imaging would be warranted.  Patient is in agreement with plan will follow up again in 8 weeks

## 2020-05-09 NOTE — Patient Instructions (Addendum)
Xray today Good to see you  Do the exercises now daily and try to use the band a little tighter.  Ice 20 minutes 2 times daily. Usually after activity and before bed. Keep hands within peripheral vison  Voltaren gel 2 times a day as needed for pain.  Have an appointment in 8 weeks but if not better in 4 weeks please call me and we will consider MRI

## 2020-05-14 ENCOUNTER — Other Ambulatory Visit: Payer: Self-pay | Admitting: Internal Medicine

## 2020-05-14 DIAGNOSIS — I10 Essential (primary) hypertension: Secondary | ICD-10-CM

## 2020-05-29 ENCOUNTER — Telehealth (INDEPENDENT_AMBULATORY_CARE_PROVIDER_SITE_OTHER): Payer: No Typology Code available for payment source | Admitting: Family

## 2020-05-29 ENCOUNTER — Other Ambulatory Visit: Payer: Self-pay | Admitting: Family

## 2020-05-29 ENCOUNTER — Other Ambulatory Visit: Payer: Self-pay

## 2020-05-29 DIAGNOSIS — R55 Syncope and collapse: Secondary | ICD-10-CM

## 2020-05-29 DIAGNOSIS — J019 Acute sinusitis, unspecified: Secondary | ICD-10-CM

## 2020-05-29 MED ORDER — PREDNISONE 20 MG PO TABS
20.0000 mg | ORAL_TABLET | Freq: Every day | ORAL | 0 refills | Status: DC
Start: 2020-05-29 — End: 2020-06-08

## 2020-05-29 MED ORDER — LEVOFLOXACIN 500 MG PO TABS: 500.0000 mg | ORAL_TABLET | Freq: Every day | ORAL | 0 refills | Status: DC

## 2020-05-29 NOTE — Progress Notes (Signed)
Carolyn [name] is a 60 y.o. female with the following history as recorded in EpicCare:  Patient Active Problem List   Diagnosis Date Noted  . Acute non-recurrent maxillary sinusitis 05/08/2020  . Non-seasonal allergic rhinitis 05/08/2020  . Left arm pain 04/06/2020  . Neck pain 02/23/2020  . Generalized anxiety disorder with panic attacks 01/11/2020  . Nonallopathic lesion of lumbar region 01/03/2020  . Gastroparesis due to DM ([place]) 11/09/2019  . Intractable migraine without aura and with status migrainosus 09/09/2019  . Patellofemoral arthritis of right knee 05/05/2019  . Type 2 diabetes mellitus with complication, without long-term current use of insulin ([place]) 04/14/2019  . Greater trochanteric bursitis of left hip 02/22/2019  . De Quervain's tenosynovitis, left 07/01/2018  . Normocytic anemia 10/22/2017  . Anemia in other chronic diseases classified elsewhere 07/29/2017  . 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 Slow transit constipation 10/19/2015  . Hypertriglyceridemia 05/25/2015  . Routine general medical examination at a health care facility 05/25/2015  . Visit for screening mammogram 10/27/2014  . Left lumbar radiculitis 10/23/2013  . Obesity (BMI 30-39.9) 09/20/2013  . Decreased cardiac ejection fraction 09/17/2013  . Snoring 07/30/2013  . Type II diabetes mellitus with manifestations ([place]) 12/02/2012  . GERD (gastroesophageal reflux disease)   . Hyperlipidemia with target LDL less than 100   . Allergic rhinitis 07/22/2010  . Essential hypertension 05/25/2010    Current Outpatient Medications  Medication Sig Dispense Refill  . BD PEN NEEDLE MICRO U/F 32G X 6 MM MISC USE 1 DAILY 100 each 3  . blood glucose meter kit and supplies KIT Dispense based on patient and insurance preference. Use up to four times daily as directed. (FOR ICD-9 250.00, 250.01). 1 each 0  . Blood Glucose Monitoring Suppl  (ONETOUCH VERIO IQ SYSTEM) W/DEVICE KIT 1 Act by Does not apply route 3 (three) times daily. 2 kit 0  . clonazePAM (KLONOPIN) 1 MG tablet Take 1 tablet (1 mg total) by mouth at bedtime. 90 tablet 1  . cloNIDine (CATAPRES - DOSED IN MG/24 HR) 0.1 mg/24hr patch PLACE 1 PATCH (0.1 MG TOTAL) ONTO THE SKIN ONCE A WEEK 12 patch 0  . Diclofenac Sodium 2 % SOLN Place 2 g onto the skin 2 (two) times daily. 112 g 3  . EPINEPHrine 0.3 mg/0.3 mL IJ SOAJ injection INJECT 0.3 MLS INTO THE MUSCLE ONCE FOR 1 DOSE  0  . Fexofenadine HCl (ALLEGRA ALLERGY PO) Take by mouth.    . fluticasone (FLONASE) 50 MCG/ACT nasal spray Place 2 sprays into both nostrils daily. 16 g 6  . gabapentin (NEURONTIN) 100 MG capsule Take 2 capsules (200 mg total) by mouth at bedtime. 180 capsule 3  . glucose blood (ONE TOUCH ULTRA TEST) test strip Use to test blood sugar three times a week and prn if having symptoms of low blood sugar Dx: 250.92 90 day supply 100 each 11  . glucose blood (ONETOUCH VERIO) test strip Use TID 100 each 12  . indapamide (LOZOL) 1.25 MG tablet Take 1 tablet (1.25 mg total) by mouth daily. 90 tablet 0  . labetalol (NORMODYNE) 300 MG tablet Take 1 tablet (300 mg total) by mouth 2 (two) times daily. 180 tablet 2  . Lancets (ONETOUCH DELICA PLUS [id number]) MISC USE UP TO 4 TIMES DAILY AS DIRECTED  1  . levofloxacin (LEVAQUIN) 500 MG tablet Take 1 tablet (500 mg total) by mouth daily. 7 tablet 0  .  LINZESS 145 MCG CAPS capsule TAKE 1 CAPSULE BY MOUTH ONCE DAILY BEFORE BREAKFAST 90 capsule 1  . omega-3 acid ethyl esters (LOVAZA) 1 g capsule Take 2 capsules (2 g total) by mouth 2 (two) times daily. 360 capsule 1  . omeprazole (PRILOSEC) 40 MG capsule TAKE 1 CAPSULE BY MOUTH TWICE DAILY AT  8  AM  AND  10  PM 180 capsule 0  . ondansetron (ZOFRAN) 4 MG tablet Take 1 tablet (4 mg total) by mouth every 8 (eight) hours as needed for nausea or vomiting. 30 tablet 4  . predniSONE (DELTASONE) 20 MG tablet Take 1 tablet (20 mg  total) by mouth daily with breakfast. 5 tablet 0  . Rimegepant Sulfate (NURTEC) 75 MG TBDP Take 1 tablet by mouth daily as needed. 8 tablet 0  . RYBELSUS 14 MG TABS Take 1 tablet by mouth once daily 90 tablet 1  . simvastatin (ZOCOR) 40 MG tablet TAKE 1 TABLET BY MOUTH AT BEDTIME 90 tablet 1  . telmisartan (MICARDIS) 80 MG tablet Take 1 tablet (80 mg total) by mouth daily. 90 tablet 0  . tizanidine (ZANAFLEX) 2 MG capsule Take 1 capsule (2 mg total) by mouth 3 (three) times daily. 30 capsule 3   No current facility-administered medications for this visit.    Allergies: Invokana [canagliflozin], Codeine, Flexeril [cyclobenzaprine], Morphine and related, Reglan [metoclopramide], and Silicon  Past Medical History:  Diagnosis Date  . Allergy    seasonal  . Anemia    Anemia of chronic disease suspected  . Anxiety attack   . Arthritis   . Benign fundic gland polyps of stomach   . Dyslipidemia   . Exertional shortness of breath   . GERD (gastroesophageal reflux disease)   . Heart murmur   . Hyperlipidemia   . Hypertension   . Migraines    "probably weekly" (01/14/2013)  . Type II diabetes mellitus ([place])     Past Surgical History:  Procedure Laterality Date  . CHOLECYSTECTOMY  1990  . COLONOSCOPY    . ENDOMETRIAL ABLATION  ~ 2000-2002   "twice" (01/14/2013)  . LEFT HEART CATHETERIZATION WITH CORONARY ANGIOGRAM N/A 01/15/2013   Procedure: LEFT HEART CATHETERIZATION WITH CORONARY ANGIOGRAM;  Surgeon: Blane Ohara, MD;  Location: [facility] CATH LAB;  Service: Cardiovascular;  Laterality: N/A;  . LEFT OOPHORECTOMY Left ~ 2003  . ORIF TIBIA & FIBULA FRACTURES Left 2003  . VAGINAL HYSTERECTOMY  ~ 2003    Family History  Problem Relation Age of Onset  . Alzheimer's disease Mother   . Hypertension Mother   . Emphysema Father   . Thyroid cancer Sister   . Cancer Sister        Breast Cancer  . Diabetes Sister   . Breast cancer Sister   . Diabetes Sister   . Emphysema Sister   . Diabetes  Brother   . Colon cancer Neg Hx     Social History   Tobacco Use  . Smoking status: Never Smoker  . Smokeless tobacco: Never Used  Substance Use Topics  . Alcohol use: No    Alcohol/week: 0.0 standard drinks    Comment: socially    Subjective:   I connected with [name] on 05/29/20 at  3:20 PM EST by a video enabled telemedicine application and verified that I am speaking with the correct person using two identifiers.   I discussed the limitations of evaluation and management by telemedicine and the availability of in person appointments. The patient  expressed understanding and agreed to proceed. Provider in office/ patient is at home; provider and patient are only 2 people on video call.   Was treated for sinus infection by her PCP on 1/24- given 10 days of Augmentin and Medrol Dose pak; noted mild initial improvement but symptoms have persisted and ears "feel very full."  She also mentions that she fell last Thursday evening and hit her head. She does not remember losing consciousness and was able to get off the floor and go back to bed. She is having a dull persisting headache;       Objective:  There were no vitals filed for this visit.  General: Well developed, well nourished, in no acute distress  Head: Normocephalic and atraumatic  Lungs: Respirations unlabored;  Neurologic: Alert and oriented; speech intact; face symmetrical; moves all extremities well; CNII-XII intact without focal deficit   Assessment:  1. Acute sinusitis, recurrence not specified, unspecified location   2. Syncope, unspecified syncope type     Plan:  1. Rx for Levaquin 500 mg qd x 7 days, Prednisone 20 mg qd x 5 days; 2. STAT head CT ordered today; she will keep planned appt for in person visit with her PCP next week; unfortunately, our office does not have any availability for in person visits before that time. ER precautions discussed;  No follow-ups on file.  No orders of the defined  types were placed in this encounter.   Requested Prescriptions    No prescriptions requested or ordered in this encounter

## 2020-06-01 ENCOUNTER — Ambulatory Visit
Admission: RE | Admit: 2020-06-01 | Discharge: 2020-06-01 | Disposition: A | Discharge status: Home / Self Care | Payer: No Typology Code available for payment source | Source: Ambulatory Visit | Attending: Family | Admitting: Family

## 2020-06-01 ENCOUNTER — Other Ambulatory Visit: Payer: Self-pay

## 2020-06-01 DIAGNOSIS — R55 Syncope and collapse: Secondary | ICD-10-CM

## 2020-06-05 ENCOUNTER — Encounter: Payer: Self-pay | Admitting: Cardiology

## 2020-06-05 ENCOUNTER — Telehealth: Payer: Self-pay | Admitting: Cardiovascular Disease

## 2020-06-05 ENCOUNTER — Other Ambulatory Visit: Payer: Self-pay

## 2020-06-05 ENCOUNTER — Telehealth: Payer: Self-pay | Admitting: Internal Medicine

## 2020-06-05 ENCOUNTER — Ambulatory Visit (INDEPENDENT_AMBULATORY_CARE_PROVIDER_SITE_OTHER): Payer: No Typology Code available for payment source | Admitting: Cardiology

## 2020-06-05 VITALS — BP 146/92 | HR 91 | Ht 64.0 in | Wt 188.2 lb

## 2020-06-05 DIAGNOSIS — R079 Chest pain, unspecified: Secondary | ICD-10-CM | POA: Diagnosis not present

## 2020-06-05 NOTE — Telephone Encounter (Signed)
Pt c/o of Chest Pain: STAT if CP now or developed within 24 hours  1. Are you having CP right now? yes  2. Are you experiencing any other symptoms (ex. SOB, nausea, vomiting, sweating)? no  3. How long have you been experiencing CP? Just started last night  4. Is your CP continuous or coming and going? continuous   5. Have you taken Nitroglycerin? No   Patient states she slept late and thinks she may have had an aniety attack last night. She states she started having chest pain, but is not having any other symptoms.  ?

## 2020-06-05 NOTE — Telephone Encounter (Signed)
Patient call sent straight into triage. Patient complaining of chest pain that has kept her up all night. Patient stated she was getting ready for bed and felt funny and had chest pain last night. Patient stated at the time she is still having chest pain. Informed patient that if she is having active chest pain she needs to go to the ED. Patient stated she is not going to the ED. Patient would like an appointment. Informed patient the next available appointment with Dr. Johnsie Cancel is later this week. Unable to continue to tell patient about any other appointment, due to patient complaining that she cannot believe Dr. Johnsie Cancel does not have anything available until then. Patient stated she was going to call her PCP. Tried to explain to the patient if her chest pain is due to her heart, that the sooner she gets checked out the better, which would call for an ER visit.

## 2020-06-05 NOTE — Telephone Encounter (Signed)
Noted  

## 2020-06-05 NOTE — Telephone Encounter (Signed)
Have her see DOD today

## 2020-06-05 NOTE — Progress Notes (Signed)
Electrophysiology Office Note   Date:  06/05/2020   ID:  Carolyn [name], Carolyn [name] Feb 16, 1961, MRN [id number]  PCP:  [name], MD  Cardiologist:  Johnsie Cancel Primary Electrophysiologist:   Meredith Leeds, MD    Chief Complaint: chest pain   History of Present Illness: Carolyn [name] is a 60 y.o. female who is being seen today for the evaluation of chest pain at the request of [name], MD. Presenting today for electrophysiology evaluation.  She has a history of hyperlipidemia, hypertension, type 2 diabetes.  She had a left heart catheterization in 2014 that showed no evidence of coronary artery disease.  Echo in 2019 showed an ejection fraction of 50 to 55%.  Today, she denies symptoms of palpitations,  orthopnea, PND, lower extremity edema, claudication, dizziness, presyncope, syncope, bleeding, or neurologic sequela. The patient is tolerating medications without difficulties.  She woke this morning at approximately 7 AM with chest pain in the left side of her chest.  The chest pain was nonradiating.  She felt well yesterday when she went to sleep.  She does wear a clonidine patch and did not wear it over the weekend.  The chest pain lasted until about 9:30 in the morning when she got out of bed.  She states that she stayed in bed to see if her pain would improve.  When she got up and moved around her house, her pain went away.  She is able to walk into the clinic today without chest pain.   Past Medical History:  Diagnosis Date  . Allergy    seasonal  . Anemia    Anemia of chronic disease suspected  . Anxiety attack   . Arthritis   . Benign fundic gland polyps of stomach   . Dyslipidemia   . Exertional shortness of breath   . GERD (gastroesophageal reflux disease)   . Heart murmur   . Hyperlipidemia   . Hypertension   . Migraines    "probably weekly" (01/14/2013)  . Type II diabetes mellitus ([place])    Past Surgical History:  Procedure Laterality Date  .  CHOLECYSTECTOMY  1990  . COLONOSCOPY    . ENDOMETRIAL ABLATION  ~ 2000-2002   "twice" (01/14/2013)  . LEFT HEART CATHETERIZATION WITH CORONARY ANGIOGRAM N/A 01/15/2013   Procedure: LEFT HEART CATHETERIZATION WITH CORONARY ANGIOGRAM;  Surgeon: Blane Ohara, MD;  Location: [facility] CATH LAB;  Service: Cardiovascular;  Laterality: N/A;  . LEFT OOPHORECTOMY Left ~ 2003  . ORIF TIBIA & FIBULA FRACTURES Left 2003  . VAGINAL HYSTERECTOMY  ~ 2003     Current Outpatient Medications  Medication Sig Dispense Refill  . BD PEN NEEDLE MICRO U/F 32G X 6 MM MISC USE 1 DAILY 100 each 3  . blood glucose meter kit and supplies KIT Dispense based on patient and insurance preference. Use up to four times daily as directed. (FOR ICD-9 250.00, 250.01). 1 each 0  . Blood Glucose Monitoring Suppl (ONETOUCH VERIO IQ SYSTEM) W/DEVICE KIT 1 Act by Does not apply route 3 (three) times daily. 2 kit 0  . clonazePAM (KLONOPIN) 1 MG tablet Take 1 tablet (1 mg total) by mouth at bedtime. 90 tablet 1  . cloNIDine (CATAPRES - DOSED IN MG/24 HR) 0.1 mg/24hr patch PLACE 1 PATCH (0.1 MG TOTAL) ONTO THE SKIN ONCE A WEEK 12 patch 0  . Diclofenac Sodium 2 % SOLN Place 2 g onto the skin 2 (two) times daily. 112 g 3  . EPINEPHrine 0.3  mg/0.3 mL IJ SOAJ injection INJECT 0.3 MLS INTO THE MUSCLE ONCE FOR 1 DOSE  0  . Fexofenadine HCl (ALLEGRA ALLERGY PO) Take by mouth.    . fluticasone (FLONASE) 50 MCG/ACT nasal spray Place 2 sprays into both nostrils daily. 16 g 6  . gabapentin (NEURONTIN) 100 MG capsule Take 2 capsules (200 mg total) by mouth at bedtime. 180 capsule 3  . glucose blood (ONE TOUCH ULTRA TEST) test strip Use to test blood sugar three times a week and prn if having symptoms of low blood sugar Dx: 250.92 90 day supply 100 each 11  . glucose blood (ONETOUCH VERIO) test strip Use TID 100 each 12  . indapamide (LOZOL) 1.25 MG tablet Take 1 tablet (1.25 mg total) by mouth daily. 90 tablet 0  . labetalol (NORMODYNE) 300 MG tablet  Take 1 tablet (300 mg total) by mouth 2 (two) times daily. 180 tablet 2  . Lancets (ONETOUCH DELICA PLUS [id number]) MISC USE UP TO 4 TIMES DAILY AS DIRECTED  1  . levofloxacin (LEVAQUIN) 500 MG tablet Take 1 tablet (500 mg total) by mouth daily. 7 tablet 0  . LINZESS 145 MCG CAPS capsule TAKE 1 CAPSULE BY MOUTH ONCE DAILY BEFORE BREAKFAST 90 capsule 1  . omega-3 acid ethyl esters (LOVAZA) 1 g capsule Take 2 capsules (2 g total) by mouth 2 (two) times daily. 360 capsule 1  . omeprazole (PRILOSEC) 40 MG capsule TAKE 1 CAPSULE BY MOUTH TWICE DAILY AT  8  AM  AND  10  PM 180 capsule 0  . ondansetron (ZOFRAN) 4 MG tablet Take 1 tablet (4 mg total) by mouth every 8 (eight) hours as needed for nausea or vomiting. 30 tablet 4  . predniSONE (DELTASONE) 20 MG tablet Take 1 tablet (20 mg total) by mouth daily with breakfast. 5 tablet 0  . Rimegepant Sulfate (NURTEC) 75 MG TBDP Take 1 tablet by mouth daily as needed. 8 tablet 0  . RYBELSUS 14 MG TABS Take 1 tablet by mouth once daily 90 tablet 1  . simvastatin (ZOCOR) 40 MG tablet TAKE 1 TABLET BY MOUTH AT BEDTIME 90 tablet 1  . telmisartan (MICARDIS) 80 MG tablet Take 1 tablet (80 mg total) by mouth daily. 90 tablet 0  . tizanidine (ZANAFLEX) 2 MG capsule Take 1 capsule (2 mg total) by mouth 3 (three) times daily. 30 capsule 3   No current facility-administered medications for this visit.    Allergies:   Invokana [canagliflozin], Codeine, Flexeril [cyclobenzaprine], Morphine and related, Reglan [metoclopramide], and Silicon   Social History:  The patient  reports that she has never smoked. She has never used smokeless tobacco. She reports that she does not drink alcohol and does not use drugs.   Family History:  The patient's family history includes Alzheimer's disease in her mother; Breast cancer in her sister; Cancer in her sister; Diabetes in her brother, sister, and sister; Emphysema in her father and sister; Hypertension in her mother; Thyroid cancer  in her sister.    ROS:  Please see the history of present illness.   Otherwise, review of systems is positive for none.   All other systems are reviewed and negative.    PHYSICAL EXAM: VS:  BP (!) 146/92   Pulse 91   Ht _0  (1.626 m)   Wt 188 lb 3.2 oz (85.4 kg)   SpO2 96%   BMI 32.30 kg/m  , BMI Body mass index is 32.3 kg/m. GEN: Well nourished, well developed,  in no acute distress  HEENT: normal  Neck: no JVD, carotid bruits, or masses Cardiac: RRR; no murmurs, rubs, or gallops,no edema  Respiratory:  clear to auscultation bilaterally, normal work of breathing GI: soft, nontender, nondistended, + BS MS: no deformity or atrophy, chest pain reproducible to left chest palpation Skin: warm and dry Neuro:  Strength and sensation are intact Psych: euthymic mood, full affect  EKG:  EKG is ordered today. Personal review of the ekg ordered shows sinus rhythm, rate 91  Recent Labs: 09/09/2019: TSH 1.73 11/09/2019: ALT 22; BUN 12; Creat 0.90; Potassium 3.9; Sodium 136 01/11/2020: Hemoglobin 10.3; Platelets 321.0    Lipid Panel     Component Value Date/Time   CHOL 169 09/09/2019 1554   TRIG 443.0 (H) 01/11/2020 1658   HDL 32.10 (L) 09/09/2019 1554   CHOLHDL 5 09/09/2019 1554   VLDL 79.2 (H) 09/09/2019 1554   LDLCALC 68 07/30/2013 1122   LDLDIRECT 72.0 09/09/2019 1554     Wt Readings from Last 3 Encounters:  06/05/20 188 lb 3.2 oz (85.4 kg)  05/09/20 184 lb (83.5 kg)  05/08/20 184 lb (83.5 kg)      Other studies Reviewed: Additional studies/ records that were reviewed today include: TTE 08/12/19  Review of the above records today demonstrates:  1. Left ventricular ejection fraction, by estimation, is 55 to 60%. The  left ventricle has normal function. The left ventricle has no regional  wall motion abnormalities. There is mild left ventricular hypertrophy.  Left ventricular diastolic parameters  are consistent with Grade I diastolic dysfunction (impaired relaxation).   2. Right ventricular systolic function is normal. The right ventricular  size is normal.  3. The mitral valve is grossly normal. Trivial mitral valve  regurgitation.  4. The aortic valve is tricuspid. Aortic valve regurgitation is not  visualized.  5. The inferior vena cava is normal in size with greater than 50%  respiratory variability, suggesting right atrial pressure of 3 mmHg.    ASSESSMENT AND PLAN:  1.  Chest pain: Chest pain occurred when she woke up from sleep this morning around 7 AM.  It continued until 9:30 AM when she got out of bed.  She was able to walk around her house and into the clinic without chest pain.  Her chest is also sensitive to palpation.  At this point, this is likely noncardiac chest pain.  No further work-up at this time.  2.  Hypertension: Mildly elevated today.  She missed her clonidine patch over the weekend and has just replaced it.  3.  Hyperlipidemia: Continue statin and Vascepa per primary cardiology.  Case discussed with primary cardiology  Current medicines are reviewed at length with the patient today.   The patient does not have concerns regarding her medicines.  The following changes were made today:  none  Labs/ tests ordered today include:  Orders Placed This Encounter  Procedures  . EKG 12-Lead   Signed,  Meredith Leeds, MD  06/05/2020 12:06 PM     [address] [place] [address] [phone] (office) [phone] (fax)

## 2020-06-05 NOTE — Telephone Encounter (Signed)
Advised to call EMS [phone] now. Patient understood and decided to go to the cardiologist.

## 2020-06-05 NOTE — Telephone Encounter (Signed)
Patient will see DOD, Dr. Curt Bears, today at 11:45 am.

## 2020-06-05 NOTE — Telephone Encounter (Signed)
Patient calling complaining of chest pain, states she wanted [name] to check her out instead of going to the ER. She also called cardiology this morning and they recommended she goes to the ER. Transferred to team health for further evaluation.

## 2020-06-08 ENCOUNTER — Other Ambulatory Visit: Payer: Self-pay

## 2020-06-08 ENCOUNTER — Encounter: Payer: Self-pay | Admitting: Internal Medicine

## 2020-06-08 ENCOUNTER — Ambulatory Visit (INDEPENDENT_AMBULATORY_CARE_PROVIDER_SITE_OTHER): Payer: No Typology Code available for payment source | Admitting: Internal Medicine

## 2020-06-08 VITALS — BP 136/88 | HR 101 | Temp 98.1°F | Resp 16 | Ht 64.0 in | Wt 186.0 lb

## 2020-06-08 DIAGNOSIS — E519 Thiamine deficiency, unspecified: Secondary | ICD-10-CM

## 2020-06-08 DIAGNOSIS — D538 Other specified nutritional anemias: Secondary | ICD-10-CM | POA: Diagnosis not present

## 2020-06-08 DIAGNOSIS — I10 Essential (primary) hypertension: Secondary | ICD-10-CM | POA: Diagnosis not present

## 2020-06-08 DIAGNOSIS — D638 Anemia in other chronic diseases classified elsewhere: Secondary | ICD-10-CM

## 2020-06-08 DIAGNOSIS — E118 Type 2 diabetes mellitus with unspecified complications: Secondary | ICD-10-CM | POA: Diagnosis not present

## 2020-06-08 DIAGNOSIS — E781 Pure hyperglyceridemia: Secondary | ICD-10-CM

## 2020-06-08 LAB — BASIC METABOLIC PANEL
BUN: 20 mg/dL (ref 6–23)
CO2: 28 mEq/L (ref 19–32)
Calcium: 9.9 mg/dL (ref 8.4–10.5)
Chloride: 101 mEq/L (ref 96–112)
Creatinine, Ser: 0.97 mg/dL (ref 0.40–1.20)
GFR: 64.5 mL/min (ref >60.00)
Glucose, Bld: 134 mg/dL — ABNORMAL HIGH (ref 70–99)
Potassium: 3.6 mEq/L (ref 3.5–5.1)
Sodium: 138 mEq/L (ref 135–145)

## 2020-06-08 LAB — CBC WITH DIFFERENTIAL/PLATELET
Basophils Absolute: 0.1 10*3/uL (ref 0.0–0.1)
Basophils Relative: 1.2 % (ref 0.0–3.0)
Eosinophils Absolute: 0.2 10*3/uL (ref 0.0–0.7)
Eosinophils Relative: 2.3 % (ref 0.0–5.0)
HCT: 30.8 % — ABNORMAL LOW (ref 36.0–46.0)
Hemoglobin: 10.6 g/dL — ABNORMAL LOW (ref 12.0–15.0)
Lymphocytes Relative: 35.3 % (ref 12.0–46.0)
Lymphs Abs: 3 10*3/uL (ref 0.7–4.0)
MCHC: 34.2 g/dL (ref 30.0–36.0)
MCV: 85.7 fl (ref 78.0–100.0)
Monocytes Absolute: 0.5 10*3/uL (ref 0.1–1.0)
Monocytes Relative: 5.7 % (ref 3.0–12.0)
Neutro Abs: 4.8 10*3/uL (ref 1.4–7.7)
Neutrophils Relative %: 55.5 % (ref 43.0–77.0)
Platelets: 319 10*3/uL (ref 150.0–400.0)
RBC: 3.6 Mil/uL — ABNORMAL LOW (ref 3.87–5.11)
RDW: 14 % (ref 11.5–15.5)
WBC: 8.6 10*3/uL (ref 4.0–10.5)

## 2020-06-08 LAB — HEMOGLOBIN A1C: Hgb A1c MFr Bld: 6.8 % — ABNORMAL HIGH (ref 4.6–6.5)

## 2020-06-08 LAB — FERRITIN: Ferritin: 104.7 ng/mL (ref 10.0–291.0)

## 2020-06-08 LAB — TRIGLYCERIDES: Triglycerides: 496 mg/dL — ABNORMAL HIGH (ref 0.0–149.0)

## 2020-06-08 LAB — TSH: TSH: 2.21 u[IU]/mL (ref 0.35–4.50)

## 2020-06-08 LAB — IRON: Iron: 50 ug/dL (ref 42–145)

## 2020-06-08 MED ORDER — OMEGA-3-ACID ETHYL ESTERS 1 G PO CAPS: 2.0000 g | ORAL_CAPSULE | Freq: Two times a day (BID) | ORAL | 1 refills | Status: DC

## 2020-06-08 NOTE — Progress Notes (Signed)
Subjective:  Patient ID: Carolyn [name], female    DOB: 01-Oct-1960  Age: 60 y.o. MRN: [id number]  CC: Anemia, Hypertension, and Diabetes  This visit occurred during the SARS-CoV-2 public health emergency.  Safety protocols were in place, including screening questions prior to the visit, additional usage of staff PPE, and extensive cleaning of exam room while observing appropriate contact time as indicated for disinfecting solutions.    HPI Carolyn [name] presents for f/up - She has felt well recently, offers no complaints.  Outpatient Medications Prior to Visit  Medication Sig Dispense Refill  . BD PEN NEEDLE MICRO U/F 32G X 6 MM MISC USE 1 DAILY 100 each 3  . blood glucose meter kit and supplies KIT Dispense based on patient and insurance preference. Use up to four times daily as directed. (FOR ICD-9 250.00, 250.01). 1 each 0  . Blood Glucose Monitoring Suppl (ONETOUCH VERIO IQ SYSTEM) W/DEVICE KIT 1 Act by Does not apply route 3 (three) times daily. 2 kit 0  . clonazePAM (KLONOPIN) 1 MG tablet Take 1 tablet (1 mg total) by mouth at bedtime. 90 tablet 1  . cloNIDine (CATAPRES - DOSED IN MG/24 HR) 0.1 mg/24hr patch PLACE 1 PATCH (0.1 MG TOTAL) ONTO THE SKIN ONCE A WEEK 12 patch 0  . Diclofenac Sodium 2 % SOLN Place 2 g onto the skin 2 (two) times daily. 112 g 3  . EPINEPHrine 0.3 mg/0.3 mL IJ SOAJ injection INJECT 0.3 MLS INTO THE MUSCLE ONCE FOR 1 DOSE  0  . Fexofenadine HCl (ALLEGRA ALLERGY PO) Take by mouth.    . fluticasone (FLONASE) 50 MCG/ACT nasal spray Place 2 sprays into both nostrils daily. 16 g 6  . gabapentin (NEURONTIN) 100 MG capsule Take 2 capsules (200 mg total) by mouth at bedtime. 180 capsule 3  . glucose blood (ONE TOUCH ULTRA TEST) test strip Use to test blood sugar three times a week and prn if having symptoms of low blood sugar Dx: 250.92 90 day supply 100 each 11  . glucose blood (ONETOUCH VERIO) test strip Use TID 100 each 12  . indapamide (LOZOL) 1.25 MG tablet  Take 1 tablet (1.25 mg total) by mouth daily. 90 tablet 0  . labetalol (NORMODYNE) 300 MG tablet Take 1 tablet (300 mg total) by mouth 2 (two) times daily. 180 tablet 2  . Lancets (ONETOUCH DELICA PLUS [id number]) MISC USE UP TO 4 TIMES DAILY AS DIRECTED  1  . LINZESS 145 MCG CAPS capsule TAKE 1 CAPSULE BY MOUTH ONCE DAILY BEFORE BREAKFAST 90 capsule 1  . omeprazole (PRILOSEC) 40 MG capsule TAKE 1 CAPSULE BY MOUTH TWICE DAILY AT  8  AM  AND  10  PM 180 capsule 0  . ondansetron (ZOFRAN) 4 MG tablet Take 1 tablet (4 mg total) by mouth every 8 (eight) hours as needed for nausea or vomiting. 30 tablet 4  . Rimegepant Sulfate (NURTEC) 75 MG TBDP Take 1 tablet by mouth daily as needed. 8 tablet 0  . RYBELSUS 14 MG TABS Take 1 tablet by mouth once daily 90 tablet 1  . simvastatin (ZOCOR) 40 MG tablet TAKE 1 TABLET BY MOUTH AT BEDTIME 90 tablet 1  . telmisartan (MICARDIS) 80 MG tablet Take 1 tablet (80 mg total) by mouth daily. 90 tablet 0  . tizanidine (ZANAFLEX) 2 MG capsule Take 1 capsule (2 mg total) by mouth 3 (three) times daily. 30 capsule 3  . levofloxacin (LEVAQUIN) 500 MG tablet Take 1 tablet (  500 mg total) by mouth daily. 7 tablet 0  . omega-3 acid ethyl esters (LOVAZA) 1 g capsule Take 2 capsules (2 g total) by mouth 2 (two) times daily. 360 capsule 1  . predniSONE (DELTASONE) 20 MG tablet Take 1 tablet (20 mg total) by mouth daily with breakfast. 5 tablet 0   No facility-administered medications prior to visit.    ROS Review of Systems  Constitutional: Negative for appetite change, diaphoresis and fatigue.  HENT: Negative.   Eyes: Negative.   Respiratory: Negative for cough, chest tightness, shortness of breath and wheezing.   Cardiovascular: Negative for chest pain, palpitations and leg swelling.  Gastrointestinal: Negative for abdominal pain, constipation, diarrhea, nausea and vomiting.  Endocrine: Negative.   Genitourinary: Negative.  Negative for difficulty urinating.   Musculoskeletal: Negative for arthralgias and myalgias.  Skin: Negative.  Negative for color change.  Neurological: Negative.  Negative for dizziness, weakness, light-headedness and headaches.  Hematological: Negative for adenopathy. Does not bruise/bleed easily.  Psychiatric/Behavioral: Negative.     Objective:  BP 136/88   Pulse (!) 101   Temp 98.1 F (36.7 C) (Oral)   Resp 16   Ht '5\' 4"'  (1.626 m)   Wt 186 lb (84.4 kg)   SpO2 95%   BMI 31.93 kg/m   BP Readings from Last 3 Encounters:  06/08/20 136/88  06/05/20 (!) 146/92  05/09/20 122/80    Wt Readings from Last 3 Encounters:  06/08/20 186 lb (84.4 kg)  06/05/20 188 lb 3.2 oz (85.4 kg)  05/09/20 184 lb (83.5 kg)    Physical Exam Vitals reviewed.  Constitutional:      Appearance: Normal appearance.  HENT:     Nose: Nose normal.     Mouth/Throat:     Mouth: Mucous membranes are moist.  Eyes:     General: No scleral icterus.    Conjunctiva/sclera: Conjunctivae normal.  Cardiovascular:     Rate and Rhythm: Normal rate and regular rhythm.     Heart sounds: No murmur heard.   Pulmonary:     Effort: Pulmonary effort is normal.     Breath sounds: No stridor. No wheezing, rhonchi or rales.  Abdominal:     General: Abdomen is flat.     Palpations: There is no mass.     Tenderness: There is no abdominal tenderness. There is no guarding.     Hernia: No hernia is present.  Musculoskeletal:        General: Normal range of motion.     Cervical back: Neck supple.     Right lower leg: No edema.     Left lower leg: No edema.  Lymphadenopathy:     Cervical: No cervical adenopathy.  Skin:    General: Skin is warm and dry.  Neurological:     General: No focal deficit present.     Mental Status: She is alert.  Psychiatric:        Mood and Affect: Mood normal.        Behavior: Behavior normal.     Lab Results  Component Value Date   WBC 8.6 06/08/2020   HGB 10.6 (L) 06/08/2020   HCT 30.8 (L) 06/08/2020   PLT  319.0 06/08/2020   GLUCOSE 134 (H) 06/08/2020   CHOL 169 09/09/2019   TRIG (H) 06/08/2020    496.0 Triglyceride is over 400; calculations on Lipids are invalid.   HDL 32.10 (L) 09/09/2019   LDLDIRECT 72.0 09/09/2019   LDLCALC 68 07/30/2013   ALT 22  11/09/2019   AST 16 11/09/2019   NA 138 06/08/2020   K 3.6 06/08/2020   CL 101 06/08/2020   CREATININE 0.97 06/08/2020   BUN 20 06/08/2020   CO2 28 06/08/2020   TSH 2.21 06/08/2020   INR 1.04 01/14/2013   HGBA1C 6.8 (H) 06/08/2020   MICROALBUR 24.9 (H) 09/09/2019    CT Head Wo Contrast  Result Date: 06/01/2020 CLINICAL DATA:  Syncope, fall EXAM: CT HEAD WITHOUT CONTRAST TECHNIQUE: Contiguous axial images were obtained from the base of the skull through the vertex without intravenous contrast. COMPARISON:  05/31/2016 FINDINGS: Brain: No acute intracranial abnormality. Specifically, no hemorrhage, hydrocephalus, mass lesion, acute infarction, or significant intracranial injury. Vascular: No hyperdense vessel or unexpected calcification. Skull: No acute calvarial abnormality. Sinuses/Orbits: No acute findings Other: None IMPRESSION: No acute intracranial abnormality. Electronically Signed   By: Rolm Baptise M.D.   On: 06/01/2020 11:49    Assessment & Plan:   Aryahna was seen today for anemia, hypertension and diabetes.  Diagnoses and all orders for this visit:  Anemia due to acquired thiamine deficiency- Her H/H are stable. I will monitor her B1 level. -     CBC with Differential/Platelet; Future -     Vitamin B1; Future -     Vitamin B1 -     CBC with Differential/Platelet  Anemia in other chronic diseases classified elsewhere- Her H/H are stable. I will monitor for secondary causes of anemia. -     CBC with Differential/Platelet; Future -     Haptoglobin; Future -     Lactate dehydrogenase; Future -     Iron; Future -     Ferritin; Future -     Ferritin -     Iron -     Lactate dehydrogenase -     Haptoglobin -     CBC with  Differential/Platelet  Type 2 diabetes mellitus with complication, without long-term current use of insulin ([place])- Her blood sugar is adequately well controlled. -     Basic metabolic panel; Future -     Hemoglobin A1c; Future -     Hemoglobin A1c -     Basic metabolic panel  Hypertriglyceridemia- Her trigs remain elevated. She will improve her lifestyle modifications. -     Triglycerides; Future -     Triglycerides -     omega-3 acid ethyl esters (LOVAZA) 1 g capsule; Take 2 capsules (2 g total) by mouth 2 (two) times daily.  Essential hypertension- Her BP is adequately well controlled. -     TSH; Future -     TSH   I have discontinued [facility]'s predniSONE and levofloxacin. I am also having her maintain her glucose blood, glucose blood, OneTouch Verio IQ System, blood glucose meter kit and supplies, EPINEPHrine, OneTouch Delica Plus [id number], BD Pen Needle Micro U/F, Fexofenadine HCl (ALLEGRA ALLERGY PO), Diclofenac Sodium, Nurtec, fluticasone, telmisartan, ondansetron, labetalol, gabapentin, tizanidine, clonazePAM, omeprazole, Rybelsus, Linzess, simvastatin, indapamide, cloNIDine, and omega-3 acid ethyl esters.  Meds ordered this encounter  Medications  . omega-3 acid ethyl esters (LOVAZA) 1 g capsule    Sig: Take 2 capsules (2 g total) by mouth 2 (two) times daily.    Dispense:  360 capsule    Refill:  1     Follow-up: Return in about 3 months (around 09/05/2020).  Scarlette Calico, MD

## 2020-06-08 NOTE — Patient Instructions (Signed)
[employer] medicine (25th ed., pp. [phone]). [place], [place]: Elsevier.">  Anemia  Anemia is a condition in which there is not enough red blood cells or hemoglobin in the blood. Hemoglobin is a substance in red blood cells that carries oxygen. When you do not have enough red blood cells or hemoglobin (are anemic), your body cannot get enough oxygen and your organs may not work properly. As a result, you may feel very tired or have other problems. What are the causes? Common causes of anemia include:  Excessive bleeding. Anemia can be caused by excessive bleeding inside or outside the body, including bleeding from the intestines or from heavy menstrual periods in females.  Poor nutrition.  Long-lasting (chronic) kidney, thyroid, and liver disease.  Bone marrow disorders, spleen problems, and blood disorders.  Cancer and treatments for cancer.  HIV (human immunodeficiency virus) and AIDS (acquired immunodeficiency syndrome).  Infections, medicines, and autoimmune disorders that destroy red blood cells. What are the signs or symptoms? Symptoms of this condition include:  Minor weakness.  Dizziness.  Headache, or difficulties concentrating and sleeping.  Heartbeats that feel irregular or faster than normal (palpitations).  Shortness of breath, especially with exercise.  Pale skin, lips, and nails, or cold hands and feet.  Indigestion and nausea. Symptoms may occur suddenly or develop slowly. If your anemia is mild, you may not have symptoms. How is this diagnosed? This condition is diagnosed based on blood tests, your medical history, and a physical exam. In some cases, a test may be needed in which cells are removed from the soft tissue inside of a bone and looked at under a microscope (bone marrow biopsy). Your health care provider may also check your stool (feces) for blood and may do additional testing to look for the cause of your bleeding. Other tests may  include:  Imaging tests, such as a CT scan or MRI.  A procedure to see inside your esophagus and stomach (endoscopy).  A procedure to see inside your colon and rectum (colonoscopy). How is this treated? Treatment for this condition depends on the cause. If you continue to lose a lot of blood, you may need to be treated at a hospital. Treatment may include:  Taking supplements of iron, vitamin Q68, or folic acid.  Taking a hormone medicine (erythropoietin) that can help to stimulate red blood cell growth.  Having a blood transfusion. This may be needed if you lose a lot of blood.  Making changes to your diet.  Having surgery to remove your spleen. Follow these instructions at home:  Take over-the-counter and prescription medicines only as told by your health care provider.  Take supplements only as told by your health care provider.  Follow any diet instructions that you were given by your health care provider.  Keep all follow-up visits as told by your health care provider. This is important. Contact a health care provider if:  You develop new bleeding anywhere in the body. Get help right away if:  You are very weak.  You are short of breath.  You have pain in your abdomen or chest.  You are dizzy or feel faint.  You have trouble concentrating.  You have bloody stools, black stools, or tarry stools.  You vomit repeatedly or you vomit up blood. These symptoms may represent a serious problem that is an emergency. Do not wait to see if the symptoms will go away. Get medical help right away. Call your local emergency services (911 in the [place] Do not  drive yourself to the hospital. Summary  Anemia is a condition in which you do not have enough red blood cells or enough of a substance in your red blood cells that carries oxygen (hemoglobin).  Symptoms may occur suddenly or develop slowly.  If your anemia is mild, you may not have symptoms.  This condition is  diagnosed with blood tests, a medical history, and a physical exam. Other tests may be needed.  Treatment for this condition depends on the cause of the anemia. This information is not intended to replace advice given to you by your health care provider. Make sure you discuss any questions you have with your health care provider. Document Revised: 03/09/2019 Document Reviewed: 03/09/2019 Elsevier Patient Education  2021 [employer]  

## 2020-06-13 LAB — LACTATE DEHYDROGENASE: LDH: 99 U/L — ABNORMAL LOW (ref 120–250)

## 2020-06-13 LAB — HAPTOGLOBIN: Haptoglobin: 141 mg/dL (ref 43–212)

## 2020-06-13 LAB — VITAMIN B1: Vitamin B1 (Thiamine): 12 nmol/L (ref 8–30)

## 2020-06-27 ENCOUNTER — Other Ambulatory Visit: Payer: Self-pay | Admitting: Internal Medicine

## 2020-06-27 DIAGNOSIS — K2100 Gastro-esophageal reflux disease with esophagitis, without bleeding: Secondary | ICD-10-CM
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
## 2020-07-04 ENCOUNTER — Ambulatory Visit (INDEPENDENT_AMBULATORY_CARE_PROVIDER_SITE_OTHER): Payer: No Typology Code available for payment source

## 2020-07-04 ENCOUNTER — Ambulatory Visit (INDEPENDENT_AMBULATORY_CARE_PROVIDER_SITE_OTHER): Payer: No Typology Code available for payment source | Admitting: Family Medicine

## 2020-07-04 ENCOUNTER — Other Ambulatory Visit: Payer: Self-pay

## 2020-07-04 ENCOUNTER — Encounter: Payer: Self-pay | Admitting: Family Medicine

## 2020-07-04 ENCOUNTER — Ambulatory Visit: Payer: Self-pay

## 2020-07-04 VITALS — BP 128/92 | Ht 64.0 in | Wt 190.0 lb

## 2020-07-04 DIAGNOSIS — M79602 Pain in left arm: Secondary | ICD-10-CM

## 2020-07-04 DIAGNOSIS — M898X6 Other specified disorders of bone, lower leg: Secondary | ICD-10-CM

## 2020-07-04 DIAGNOSIS — M542 Cervicalgia: Secondary | ICD-10-CM | POA: Diagnosis not present

## 2020-07-04 DIAGNOSIS — S8012XA Contusion of left lower leg, initial encounter: Secondary | ICD-10-CM | POA: Insufficient documentation

## 2020-07-04 DIAGNOSIS — M7552 Bursitis of left shoulder: Secondary | ICD-10-CM

## 2020-07-04 NOTE — Assessment & Plan Note (Signed)
Seems to be more of a contusion of the tibia.  Do not see any cortical irregularity.  Patient does does have an IM rod in this area.  Due to that we will get x-rays to further evaluate.  Discussed topical anti-inflammatories and likely another 2 to 4 weeks till completely pain-free.  Patient is able to ambulate without any significant difficulty.

## 2020-07-04 NOTE — Assessment & Plan Note (Signed)
Patient does have bursitis noted of the shoulder.  We discussed icing regimen and home exercises.  Discussed which activities to do which wants to avoid.  Patient is to increase activity slowly.  Discussed icing regimen.  Follow-up with me again in 6 weeks.  Continue to have pain consider injection to help diagnostically and hopefully therapeutically.

## 2020-07-04 NOTE — Assessment & Plan Note (Signed)
Neck pain seems to be somewhat worse.  Seems to be more in the occipital area.  Patient does have known degenerative disc disease of the cervical spine in multiple different areas.  This could also be giving her some of the shoulder pain.  Ultrasound of the shoulder though does show patient does have some degenerative changes of the supraspinatus with a reactive bursitis.  Patient would like to continue with the conservative therapy though of the home exercises, topical anti-inflammatories and icing regimen.  Patient will see me again in 6 to 8 weeks.  At that time I would like to consider the possibility of injection in the shoulder to help [place] with diagnostic before we go to advanced imaging with MRIs.

## 2020-07-04 NOTE — Patient Instructions (Signed)
Shoulder still has some swelling Continue exercises  Neck- 2 tennis balls in a tube sock Lay on them for 5 min  L tib/fib xray today  Arnica lotion  Coop pillow  See me again in 6-7 weeks-If shoulder and neck are still bothersome we will try shoulder injection

## 2020-07-16 ENCOUNTER — Encounter: Payer: Self-pay | Admitting: Internal Medicine

## 2020-07-17 ENCOUNTER — Other Ambulatory Visit: Payer: Self-pay | Admitting: Internal Medicine

## 2020-07-17 DIAGNOSIS — I10 Essential (primary) hypertension: Secondary | ICD-10-CM

## 2020-07-17 MED ORDER — CLONIDINE HCL 0.1 MG PO TABS: 0.1000 mg | ORAL_TABLET | Freq: Three times a day (TID) | ORAL | 1 refills | Status: DC

## 2020-07-21 ENCOUNTER — Other Ambulatory Visit: Payer: Self-pay | Admitting: Internal Medicine

## 2020-07-21 DIAGNOSIS — I10 Essential (primary) hypertension: Secondary | ICD-10-CM

## 2020-07-21 MED ORDER — CLONIDINE HCL 0.1 MG PO TABS: 0.1000 mg | ORAL_TABLET | Freq: Three times a day (TID) | ORAL | 1 refills | Status: DC

## 2020-08-04 ENCOUNTER — Other Ambulatory Visit: Payer: Self-pay | Admitting: Internal Medicine

## 2020-08-04 DIAGNOSIS — I10 Essential (primary) hypertension: Secondary | ICD-10-CM

## 2020-08-18 ENCOUNTER — Ambulatory Visit: Payer: No Typology Code available for payment source | Admitting: Family Medicine

## 2020-08-28 LAB — HM DIABETES EYE EXAM

## 2020-08-30 ENCOUNTER — Other Ambulatory Visit: Payer: Self-pay | Admitting: Internal Medicine

## 2020-08-30 DIAGNOSIS — E118 Type 2 diabetes mellitus with unspecified complications: Secondary | ICD-10-CM

## 2020-08-30 DIAGNOSIS — I10 Essential (primary) hypertension: Secondary | ICD-10-CM

## 2020-09-06 ENCOUNTER — Other Ambulatory Visit: Payer: Self-pay | Admitting: Internal Medicine

## 2020-09-06 DIAGNOSIS — E118 Type 2 diabetes mellitus with unspecified complications: Secondary | ICD-10-CM

## 2020-09-11 ENCOUNTER — Other Ambulatory Visit: Payer: Self-pay | Admitting: Cardiovascular Disease

## 2020-09-13 ENCOUNTER — Encounter: Payer: Self-pay | Admitting: Adult Health

## 2020-09-13 ENCOUNTER — Telehealth (INDEPENDENT_AMBULATORY_CARE_PROVIDER_SITE_OTHER): Payer: No Typology Code available for payment source | Admitting: Adult Health

## 2020-09-13 VITALS — Temp 98.1°F | Ht 64.0 in | Wt 190.0 lb

## 2020-09-13 DIAGNOSIS — R059 Cough, unspecified: Secondary | ICD-10-CM

## 2020-09-13 MED ORDER — METHYLPREDNISOLONE 4 MG PO TBPK: ORAL_TABLET | ORAL | 0 refills | Status: DC

## 2020-09-13 MED ORDER — BENZONATATE 200 MG PO CAPS: 200.0000 mg | ORAL_CAPSULE | Freq: Two times a day (BID) | ORAL | 0 refills | Status: DC | PRN

## 2020-09-13 NOTE — Progress Notes (Signed)
Virtual Visit via Video Note  I connected with Carolyn [name] on 09/13/20 at  4:00 PM EDT by a video enabled telemedicine application and verified that I am speaking with the correct person using two identifiers.  Location patient: home Location provider:work or home office Persons participating in the virtual visit: patient, provider  I discussed the limitations of evaluation and management by telemedicine and the availability of in person appointments. The patient expressed understanding and agreed to proceed.   HPI: 60 year old female who is being evaluated today for an acute issue of a cough.  Cough started roughly 24 hours ago.  Cough is constant and associated with mild shortness of breath and wheezing.  Reports that she was cleaning yesterday and was using bleach and unventilated area and believes that this is what aggravated her lungs.  No known history of asthma   ROS: See pertinent positives and negatives per HPI.  Past Medical History:  Diagnosis Date  . Allergy    seasonal  . Anemia    Anemia of chronic disease suspected  . Anxiety attack   . Arthritis   . Benign fundic gland polyps of stomach   . Dyslipidemia   . Exertional shortness of breath   . GERD (gastroesophageal reflux disease)   . Heart murmur   . Hyperlipidemia   . Hypertension   . Migraines    "probably weekly" (01/14/2013)  . Type II diabetes mellitus ([place])     Past Surgical History:  Procedure Laterality Date  . CHOLECYSTECTOMY  1990  . COLONOSCOPY    . ENDOMETRIAL ABLATION  ~ 2000-2002   "twice" (01/14/2013)  . LEFT HEART CATHETERIZATION WITH CORONARY ANGIOGRAM N/A 01/15/2013   Procedure: LEFT HEART CATHETERIZATION WITH CORONARY ANGIOGRAM;  Surgeon: Blane Ohara, MD;  Location: [facility] CATH LAB;  Service: Cardiovascular;  Laterality: N/A;  . LEFT OOPHORECTOMY Left ~ 2003  . ORIF TIBIA & FIBULA FRACTURES Left 2003  . VAGINAL HYSTERECTOMY  ~ 2003    Family History  Problem Relation Age of Onset   . Alzheimer's disease Mother   . Hypertension Mother   . Emphysema Father   . Thyroid cancer Sister   . Cancer Sister        Breast Cancer  . Diabetes Sister   . Breast cancer Sister   . Diabetes Sister   . Emphysema Sister   . Diabetes Brother   . Colon cancer Neg Hx        Current Outpatient Medications:  .  BD PEN NEEDLE MICRO U/F 32G X 6 MM MISC, USE 1 DAILY, Disp: 100 each, Rfl: 3 .  benzonatate (TESSALON) 200 MG capsule, Take 1 capsule (200 mg total) by mouth 2 (two) times daily as needed for cough., Disp: 20 capsule, Rfl: 0 .  blood glucose meter kit and supplies KIT, Dispense based on patient and insurance preference. Use up to four times daily as directed. (FOR ICD-9 250.00, 250.01)., Disp: 1 each, Rfl: 0 .  Blood Glucose Monitoring Suppl (ONETOUCH VERIO IQ SYSTEM) W/DEVICE KIT, 1 Act by Does not apply route 3 (three) times daily., Disp: 2 kit, Rfl: 0 .  clonazePAM (KLONOPIN) 1 MG tablet, Take 1 tablet (1 mg total) by mouth at bedtime., Disp: 90 tablet, Rfl: 1 .  cloNIDine (CATAPRES) 0.1 MG tablet, Take 1 tablet (0.1 mg total) by mouth 3 (three) times daily., Disp: 270 tablet, Rfl: 1 .  Diclofenac Sodium 2 % SOLN, Place 2 g onto the skin 2 (two) times daily., Disp:  112 g, Rfl: 3 .  EPINEPHrine 0.3 mg/0.3 mL IJ SOAJ injection, INJECT 0.3 MLS INTO THE MUSCLE ONCE FOR 1 DOSE, Disp: , Rfl: 0 .  Fexofenadine HCl (ALLEGRA ALLERGY PO), Take by mouth., Disp: , Rfl:  .  fluticasone (FLONASE) 50 MCG/ACT nasal spray, Place 2 sprays into both nostrils daily., Disp: 16 g, Rfl: 6 .  gabapentin (NEURONTIN) 100 MG capsule, Take 2 capsules (200 mg total) by mouth at bedtime., Disp: 180 capsule, Rfl: 3 .  glucose blood (ONE TOUCH ULTRA TEST) test strip, Use to test blood sugar three times a week and prn if having symptoms of low blood sugar Dx: 250.92 90 day supply, Disp: 100 each, Rfl: 11 .  glucose blood (ONETOUCH VERIO) test strip, Use TID, Disp: 100 each, Rfl: 12 .  indapamide (LOZOL)  1.25 MG tablet, Take 1 tablet by mouth once daily, Disp: 90 tablet, Rfl: 0 .  labetalol (NORMODYNE) 300 MG tablet, Take 1 tablet by mouth twice daily, Disp: 180 tablet, Rfl: 0 .  Lancets (ONETOUCH DELICA PLUS [id number]) MISC, USE UP TO 4 TIMES DAILY AS DIRECTED, Disp: , Rfl: 1 .  LINZESS 145 MCG CAPS capsule, TAKE 1 CAPSULE BY MOUTH ONCE DAILY BEFORE BREAKFAST, Disp: 90 capsule, Rfl: 1 .  methylPREDNISolone (MEDROL DOSEPAK) 4 MG TBPK tablet, Take as directed, Disp: 21 tablet, Rfl: 0 .  omega-3 acid ethyl esters (LOVAZA) 1 g capsule, Take 2 capsules (2 g total) by mouth 2 (two) times daily., Disp: 360 capsule, Rfl: 1 .  omeprazole (PRILOSEC) 40 MG capsule, TAKE 1 CAPSULE BY MOUTH TWICE DAILY AT  8  AM  AND  10  PM, Disp: 180 capsule, Rfl: 0 .  ondansetron (ZOFRAN) 4 MG tablet, Take 1 tablet (4 mg total) by mouth every 8 (eight) hours as needed for nausea or vomiting., Disp: 30 tablet, Rfl: 4 .  Rimegepant Sulfate (NURTEC) 75 MG TBDP, Take 1 tablet by mouth daily as needed., Disp: 8 tablet, Rfl: 0 .  RYBELSUS 14 MG TABS, Take 1 tablet by mouth once daily, Disp: 90 tablet, Rfl: 0 .  simvastatin (ZOCOR) 40 MG tablet, TAKE 1 TABLET BY MOUTH AT BEDTIME, Disp: 90 tablet, Rfl: 1 .  telmisartan (MICARDIS) 80 MG tablet, Take 1 tablet by mouth once daily, Disp: 90 tablet, Rfl: 1 .  tizanidine (ZANAFLEX) 2 MG capsule, Take 1 capsule (2 mg total) by mouth 3 (three) times daily., Disp: 30 capsule, Rfl: 3  EXAM:  VITALS per patient if applicable:  GENERAL: alert, oriented, appears well and in no acute distress  HEENT: atraumatic, conjunttiva clear, no obvious abnormalities on inspection of external nose and ears  NECK: normal movements of the head and neck  LUNGS: on inspection no signs of respiratory distress, breathing rate appears normal, no obvious gross SOB, gasping. Dry cough and wheezing present  CV: no obvious cyanosis  MS: moves all visible extremities without noticeable  abnormality  PSYCH/NEURO: pleasant and cooperative, no obvious depression or anxiety, speech and thought processing grossly intact  ASSESSMENT AND PLAN:  Discussed the following assessment and plan:  Cough - Plan: methylPREDNISolone (MEDROL DOSEPAK) 4 MG TBPK tablet, benzonatate (TESSALON) 200 MG capsule     I discussed the assessment and treatment plan with the patient. The patient was provided an opportunity to ask questions and all were answered. The patient agreed with the plan and demonstrated an understanding of the instructions.   The patient was advised to call back or seek an in-person evaluation if  the symptoms worsen or if the condition fails to improve as anticipated.   Dorothyann Peng, NP
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
## 2020-09-25 ENCOUNTER — Ambulatory Visit: Payer: No Typology Code available for payment source | Admitting: Family Medicine

## 2020-09-27 ENCOUNTER — Other Ambulatory Visit: Payer: Self-pay | Admitting: Internal Medicine

## 2020-09-27 DIAGNOSIS — K5904 Chronic idiopathic constipation: Secondary | ICD-10-CM
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
## 2020-09-28 ENCOUNTER — Encounter: Payer: Self-pay | Admitting: Family Medicine

## 2020-09-28 ENCOUNTER — Ambulatory Visit (INDEPENDENT_AMBULATORY_CARE_PROVIDER_SITE_OTHER): Payer: No Typology Code available for payment source | Admitting: Family Medicine

## 2020-09-28 ENCOUNTER — Other Ambulatory Visit: Payer: Self-pay

## 2020-09-28 ENCOUNTER — Ambulatory Visit: Payer: Self-pay

## 2020-09-28 VITALS — BP 124/92 | HR 88 | Ht 64.0 in | Wt 186.0 lb

## 2020-09-28 DIAGNOSIS — M25512 Pain in left shoulder: Secondary | ICD-10-CM | POA: Diagnosis not present

## 2020-09-28 DIAGNOSIS — M542 Cervicalgia: Secondary | ICD-10-CM | POA: Diagnosis not present

## 2020-09-28 DIAGNOSIS — G8929 Other chronic pain: Secondary | ICD-10-CM | POA: Diagnosis not present

## 2020-09-28 NOTE — Assessment & Plan Note (Signed)
Chronic problem and stable.  Does respond well to osteopathic manipulation.  We will continue to consider to do that but patient is doing well and can follow-up with me in 2 to 3 months.  No change in management.  No change in medications.

## 2020-09-28 NOTE — Patient Instructions (Signed)
Great to see you See me in 2-3 months 

## 2020-10-02 NOTE — Progress Notes (Signed)
Evaluation Performed:  Follow-up visit  Date:  10/02/2020   ID:  Carolyn [name], DOB 02/26/1961, MRN [id number]  PCP:  [name], MD  Cardiologist:  Odelia Gage Electrophysiologist:  None   Chief Complaint:  HTN  History of Present Illness:    60 y.o. with a History of HTN, OSA, Anemia. Normal cath in 2014 Last echo 08/12/19 EF 55-60% trivial MR . 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 She has worked from home for [employer] for 3 years so is not phased By the [place] for chronic neck pain responds to chiropractic manipulation    Some stress as one of her son's is moving to [place] to work for [employer] BP up a bit Compliant with meds    Past Medical History:  Diagnosis Date   Allergy    seasonal   Anemia    Anemia of chronic disease suspected   Anxiety attack    Arthritis    Benign fundic gland polyps of stomach    Dyslipidemia    Exertional shortness of breath    GERD (gastroesophageal reflux disease)    Heart murmur    Hyperlipidemia    Hypertension    Migraines    "probably weekly" (01/14/2013)   Type II diabetes mellitus ([place])    Past Surgical History:  Procedure Laterality Date   CHOLECYSTECTOMY  1990   COLONOSCOPY     ENDOMETRIAL ABLATION  ~ 2000-2002   "twice" (01/14/2013)   LEFT HEART CATHETERIZATION WITH CORONARY ANGIOGRAM N/A 01/15/2013   Procedure: LEFT HEART CATHETERIZATION WITH CORONARY ANGIOGRAM;  Surgeon: Blane Ohara, MD;  Location: [facility] CATH LAB;  Service: Cardiovascular;  Laterality: N/A;   LEFT OOPHORECTOMY Left ~ 2003   ORIF TIBIA & FIBULA FRACTURES Left 2003   VAGINAL HYSTERECTOMY  ~ 2003     No outpatient medications have been marked as taking for the 10/06/20 encounter (Appointment)  with Josue Hector, MD.     Allergies:   Invokana [canagliflozin], Codeine, Flexeril [cyclobenzaprine], Morphine and related, Reglan [metoclopramide], and Silicon   Social History   Tobacco Use   Smoking status: Never   Smokeless tobacco: Never  Vaping Use   Vaping Use: Never used  Substance Use Topics   Alcohol use: No    Alcohol/week: 0.0 standard drinks    Comment: socially   Drug use: No     Family Hx: The patient's family history includes Alzheimer's disease in her mother; Breast cancer in her sister; Cancer in her sister; Diabetes in her brother, sister, and sister; Emphysema in her father and sister; Hypertension in her mother; Thyroid cancer in her sister. There is no history of Colon cancer.  ROS:   Please see the history of present illness.     All other systems reviewed and are negative.   Prior CV studies:   The following studies were reviewed today:  Echo 08/04/17 Study Conclusions   - Left ventricle: The cavity size was normal. Wall thickness was   increased in a pattern of moderate LVH. Systolic function was   normal. The estimated ejection fraction was in the range of 50%   to 55%. Images were inadequate for LV  wall motion assessment.   Doppler parameters are consistent with abnormal left ventricular   relaxation (grade 1 diastolic dysfunction). The E/e&' ratio is   between 8-15, suggesting indeterminate LV filling pressure. - Aortic valve: Trileaflet. Sclerosis without stenosis. There was   no regurgitation. - Mitral valve: Calcified annulus. Mildly thickened leaflets .   There was trivial regurgitation. - Left atrium: The atrium was normal in size. - Tricuspid valve: There was trivial regurgitation. - Pulmonary arteries: PA peak pressure: 16 mm Hg (S). - Inferior vena cava: The vessel was normal in size. The   respirophasic diameter changes were in the normal range (= 50%),   consistent with normal central venous pressure.   Impressions:   -  Compared to a prior study in 2017, the LVEF is lower at 50-55%.  Labs/Other Tests and Data Reviewed:    EKG:   06/05/20 SR rate 91 normal   Recent Labs: 11/09/2019: ALT 22 06/08/2020: BUN 20; Creatinine, Ser 0.97; Hemoglobin 10.6; Platelets 319.0; Potassium 3.6; Sodium 138; TSH 2.21   Recent Lipid Panel Lab Results  Component Value Date/Time   CHOL 169 09/09/2019 03:54 PM   TRIG (H) 06/08/2020 08:54 AM    496.0 Triglyceride is over 400; calculations on Lipids are invalid.   HDL 32.10 (L) 09/09/2019 03:54 PM   CHOLHDL 5 09/09/2019 03:54 PM   LDLCALC 68 07/30/2013 11:22 AM   LDLDIRECT 72.0 09/09/2019 03:54 PM    Wt Readings from Last 3 Encounters:  09/28/20 84.4 kg  09/13/20 86.2 kg  07/04/20 86.2 kg     Objective:    Vital Signs:  There were no vitals taken for this visit.   Affect appropriate Overweight black female  HEENT: normal Neck supple with no adenopathy JVP normal no bruits no thyromegaly Lungs clear with no wheezing and good diaphragmatic motion Heart:  S1/S2 no murmur, no rub, gallop or click PMI normal Abdomen: benighn, BS positve, no tenderness, no AAA no bruit.  No HSM or HJR Distal pulses intact with no bruits No edema Neuro non-focal Skin warm and dry No muscular weakness  ASSESSMENT & PLAN:    HTN:  f/U primary Dr Ronnald Ramp consider increasing clonidine if lifestyle changes don't help PT/OT:  Has seen [name] has seen him for a myriad of neck and arm/leg pains   DM:  Discussed low carb diet.  Target hemoglobin A1c is 6.5 or less.  Continue current medications. HLD:  On statin and vascepa f/u Dr Ronnald Ramp LDL at goal 81 mg/dL f/u coronary calcium score to risk stratify  COVID-19 Education: The signs and symptoms of COVID-19 were discussed with the patient and how to seek care for testing (follow up with PCP or arrange E-visit).  The importance of social distancing was discussed today.    Medication Adjustments/Labs and Tests Ordered: Current  medicines are reviewed at length with the patient today.  Concerns regarding medicines are outlined above.  Tests Ordered:  Coronary calcium score   Medication Changes: No orders of the defined types were placed in this encounter.   Disposition:  Follow up  in a year   Signed, Jenkins Rouge, MD  10/02/2020 6:42 PM    [facility]

## 2020-10-03 ENCOUNTER — Other Ambulatory Visit: Payer: Self-pay | Admitting: Internal Medicine

## 2020-10-03 DIAGNOSIS — K2100 Gastro-esophageal reflux disease with esophagitis, without bleeding: Secondary | ICD-10-CM

## 2020-10-06 ENCOUNTER — Ambulatory Visit (INDEPENDENT_AMBULATORY_CARE_PROVIDER_SITE_OTHER): Payer: No Typology Code available for payment source | Admitting: Cardiovascular Disease

## 2020-10-06 ENCOUNTER — Other Ambulatory Visit: Payer: Self-pay

## 2020-10-06 ENCOUNTER — Encounter: Payer: Self-pay | Admitting: Cardiovascular Disease

## 2020-10-06 VITALS — BP 140/94 | HR 98 | Ht 64.0 in | Wt 186.0 lb

## 2020-10-06 DIAGNOSIS — E785 Hyperlipidemia, unspecified: Secondary | ICD-10-CM

## 2020-10-06 DIAGNOSIS — I10 Essential (primary) hypertension: Secondary | ICD-10-CM | POA: Diagnosis not present

## 2020-10-06 NOTE — Patient Instructions (Addendum)
Medication Instructions:   *If you need a refill on your cardiac medications before your next appointment, please call your pharmacy*  Lab Work: If you have labs (blood work) drawn today and your tests are completely normal, you will receive your results only by: [place] (if you have MyChart) OR A paper copy in the mail If you have any lab test that is abnormal or we need to change your treatment, we will call you to review the results.  Testing/Procedures: Cardiac CT scanning for Calcium Score, (CAT scanning), is a noninvasive, special x-ray that produces cross-sectional images of the body using x-rays and a computer. CT scans help physicians diagnose and treat medical conditions. For some CT exams, a contrast material is used to enhance visibility in the area of the body being studied. CT scans provide greater clarity and reveal more details than regular x-ray exams.  Follow-Up: At [facility], you and your health needs are our priority.  As part of our continuing mission to provide you with exceptional heart care, we have created designated Provider Care Teams.  These Care Teams include your primary Cardiologist (physician) and Advanced Practice Providers (APPs -  Physician Assistants and Nurse Practitioners) who all work together to provide you with the care you need, when you need it.  We recommend signing up for the patient portal called "MyChart".  Sign up information is provided on this After Visit Summary.  MyChart is used to connect with patients for Virtual Visits (Telemedicine).  Patients are able to view lab/test results, encounter notes, upcoming appointments, etc.  Non-urgent messages can be sent to your provider as well.   To learn more about what you can do with MyChart, go to [email or web address].    Your next appointment:   12 month(s)  The format for your next appointment:   In Person  Provider:   You may see Dr. Johnsie Cancel or one of the following Advanced  Practice Providers on your designated Care Team:   Kathyrn Drown, NP

## 2020-10-19 ENCOUNTER — Other Ambulatory Visit: Payer: Self-pay | Admitting: Internal Medicine

## 2020-10-19 DIAGNOSIS — Z1231 Encounter for screening mammogram for malignant neoplasm of breast: Secondary | ICD-10-CM

## 2020-10-26 ENCOUNTER — Encounter: Payer: Self-pay | Admitting: Family Medicine

## 2020-10-26 ENCOUNTER — Ambulatory Visit (INDEPENDENT_AMBULATORY_CARE_PROVIDER_SITE_OTHER): Payer: No Typology Code available for payment source | Admitting: Family Medicine

## 2020-10-26 ENCOUNTER — Other Ambulatory Visit: Payer: Self-pay

## 2020-10-26 VITALS — BP 124/94 | HR 102 | Wt 180.0 lb

## 2020-10-26 DIAGNOSIS — M9908 Segmental and somatic dysfunction of rib cage: Secondary | ICD-10-CM | POA: Diagnosis not present

## 2020-10-26 DIAGNOSIS — M999 Biomechanical lesion, unspecified: Secondary | ICD-10-CM | POA: Diagnosis not present

## 2020-10-26 DIAGNOSIS — M9903 Segmental and somatic dysfunction of lumbar region: Secondary | ICD-10-CM | POA: Diagnosis not present

## 2020-10-26 DIAGNOSIS — M5416 Radiculopathy, lumbar region: Secondary | ICD-10-CM

## 2020-10-26 DIAGNOSIS — M9902 Segmental and somatic dysfunction of thoracic region: Secondary | ICD-10-CM

## 2020-10-26 DIAGNOSIS — M1711 Unilateral primary osteoarthritis, right knee: Secondary | ICD-10-CM | POA: Diagnosis not present

## 2020-10-26 DIAGNOSIS — M9901 Segmental and somatic dysfunction of cervical region: Secondary | ICD-10-CM | POA: Diagnosis not present
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
## 2020-10-26 NOTE — Patient Instructions (Signed)
Pes Anserine exercises Pennsaid 2 x a day Safe travels to [place] See me as scheduled

## 2020-10-26 NOTE — Assessment & Plan Note (Signed)
Chronic problem with exacerbation.  Radiation still seems to be more on the left side even though pain seems to be on the right side.  Discussed icing regimen and home exercises.  Does have the Zanaflex for breakthrough pain.  Has had a Medrol Dosepak previously as well.  We will continue to monitor.  Does respond to manipulation and hopefully will continue to do so.  Follow-up again in 6 to 8 weeks

## 2020-10-26 NOTE — Assessment & Plan Note (Signed)
Patient given injection and tolerated the procedure well.  Discussed icing regimen and home exercises, and the patient could be candidate for possible viscosupplementation.  Consider the possibility of bracing.  Follow-up again 6 to 8 weeks.  Topical anti-inflammatories have been prescribed previously.

## 2020-10-26 NOTE — Assessment & Plan Note (Signed)

## 2020-10-30 ENCOUNTER — Other Ambulatory Visit: Payer: Self-pay

## 2020-11-03 ENCOUNTER — Other Ambulatory Visit: Payer: Self-pay | Admitting: Internal Medicine

## 2020-11-03 DIAGNOSIS — E785 Hyperlipidemia, unspecified: Secondary | ICD-10-CM

## 2020-11-07 ENCOUNTER — Ambulatory Visit (INDEPENDENT_AMBULATORY_CARE_PROVIDER_SITE_OTHER)
Admission: RE | Admit: 2020-11-07 | Discharge: 2020-11-07 | Disposition: A | Discharge status: Home / Self Care | Payer: Self-pay | Source: Ambulatory Visit | Attending: Cardiovascular Disease | Admitting: Cardiovascular Disease

## 2020-11-07 ENCOUNTER — Other Ambulatory Visit: Payer: Self-pay

## 2020-11-07 DIAGNOSIS — E785 Hyperlipidemia, unspecified: Secondary | ICD-10-CM

## 2020-11-07 DIAGNOSIS — I10 Essential (primary) hypertension: Secondary | ICD-10-CM

## 2020-11-08 ENCOUNTER — Telehealth: Payer: Self-pay

## 2020-11-08 DIAGNOSIS — R9431 Abnormal electrocardiogram [ECG] [EKG]: Secondary | ICD-10-CM

## 2020-11-08 NOTE — Telephone Encounter (Signed)
-----   Message from Josue Hector, MD sent at 11/07/2020  4:46 PM EDT ----- Calcium score quite high 98 th percentile  Have her f/u with lexiscan myovue

## 2020-11-08 NOTE — Telephone Encounter (Signed)
The patient has been notified of the result and verbalized understanding.  All questions (if any) were answered. Michaelyn Barter, RN 11/08/2020 1:42 PM   Will order lexiscan and send instructions through mychart.

## 2020-11-09 ENCOUNTER — Telehealth (HOSPITAL_COMMUNITY): Payer: Self-pay

## 2020-11-09 NOTE — Telephone Encounter (Signed)
Spoke with the patient, detailed instructions given. She stated that she would be here for her test. Asked to call back with any questions. S. EMTP 

## 2020-11-14 ENCOUNTER — Other Ambulatory Visit: Payer: Self-pay | Admitting: Internal Medicine

## 2020-11-14 ENCOUNTER — Other Ambulatory Visit: Payer: Self-pay

## 2020-11-14 ENCOUNTER — Ambulatory Visit (HOSPITAL_COMMUNITY): Payer: No Typology Code available for payment source | Attending: Cardiovascular Disease

## 2020-11-14 DIAGNOSIS — R9431 Abnormal electrocardiogram [ECG] [EKG]: Secondary | ICD-10-CM | POA: Diagnosis present

## 2020-11-14 DIAGNOSIS — I10 Essential (primary) hypertension: Secondary | ICD-10-CM

## 2020-11-14 DIAGNOSIS — E118 Type 2 diabetes mellitus with unspecified complications: Secondary | ICD-10-CM

## 2020-11-14 LAB — MYOCARDIAL PERFUSION IMAGING
LV dias vol: 156 mL (ref 46–106)
LV sys vol: 103 mL
SDS: 0
SRS: 0
SSS: 0
TID: 1

## 2020-11-14 MED ORDER — TECHNETIUM TC 99M TETROFOSMIN IV KIT
10.3000 | PACK | Freq: Once | INTRAVENOUS | Status: AC | PRN
  Administered 2020-11-14: 10.3 via INTRAVENOUS
  Filled 2020-11-14: qty 11

## 2020-11-14 MED ORDER — AMINOPHYLLINE 25 MG/ML IV SOLN
75.0000 mg | Freq: Once | INTRAVENOUS | Status: AC
  Administered 2020-11-14: 75 mg via INTRAVENOUS

## 2020-11-14 MED ORDER — TECHNETIUM TC 99M TETROFOSMIN IV KIT
32.7000 | PACK | Freq: Once | INTRAVENOUS | Status: AC | PRN
  Administered 2020-11-14: 32.7 via INTRAVENOUS
  Filled 2020-11-14: qty 33

## 2020-11-14 MED ORDER — REGADENOSON 0.4 MG/5ML IV SOLN
0.4000 mg | Freq: Once | INTRAVENOUS | Status: AC
  Administered 2020-11-14: 0.4 mg via INTRAVENOUS

## 2020-11-14 NOTE — Telephone Encounter (Signed)
pt has called in reference to her Rybelsus '14MG'$  tabs. Pt states she [place] her medication at the airport and is wanting to know if you can send in a rx refill for the above medication. She said she is completely out and needs her medication.

## 2020-11-15 ENCOUNTER — Telehealth: Payer: Self-pay

## 2020-11-15 DIAGNOSIS — R9430 Abnormal result of cardiovascular function study, unspecified: Secondary | ICD-10-CM

## 2020-11-15 NOTE — Progress Notes (Signed)
Patient Care Team: Etta Grandchild, MD as PCP - General (Internal Medicine)  DIAGNOSIS:    ICD-10-CM   1. Normocytic anemia  D64.9 CBC with Differential ([facility] Only)    Retic Panel      CHIEF COMPLIANT: Follow-up of normocytic anemia  INTERVAL HISTORY: Carolyn [name] is a 60 y.o. with above-mentioned history of normocytic anemia. Labs on 06/08/20 showed Hg 10.6, HCT 30.8, platelets 319. She presents to the clinic today to review recent labs.  She has had some cardiac issues lately and is working with cardiology.  She expects to have an echocardiogram soon.  She does feel slightly short of breath but denies any fatigue lightheadedness dizziness.  ALLERGIES:  is allergic to invokana [canagliflozin], codeine, flexeril [cyclobenzaprine], morphine and related, reglan [metoclopramide], and silicon.  MEDICATIONS:  Current Outpatient Medications  Medication Sig Dispense Refill   BD PEN NEEDLE MICRO U/F 32G X 6 MM MISC USE 1 DAILY 100 each 3   benzonatate (TESSALON) 200 MG capsule Take 1 capsule (200 mg total) by mouth 2 (two) times daily as needed for cough. 20 capsule 0   blood glucose meter kit and supplies KIT Dispense based on patient and insurance preference. Use up to four times daily as directed. (FOR ICD-9 250.00, 250.01). 1 each 0   Blood Glucose Monitoring Suppl (ONETOUCH VERIO IQ SYSTEM) W/DEVICE KIT 1 Act by Does not apply route 3 (three) times daily. 2 kit 0   clonazePAM (KLONOPIN) 1 MG tablet Take 1 tablet (1 mg total) by mouth at bedtime. 90 tablet 1   cloNIDine (CATAPRES) 0.1 MG tablet Take 1 tablet (0.1 mg total) by mouth 3 (three) times daily. 270 tablet 1   Diclofenac Sodium 2 % SOLN Place 2 g onto the skin 2 (two) times daily. 112 g 3   EPINEPHrine 0.3 mg/0.3 mL IJ SOAJ injection INJECT 0.3 MLS INTO THE MUSCLE ONCE FOR 1 DOSE  0   Fexofenadine HCl (ALLEGRA ALLERGY PO) Take by mouth.     fluticasone (FLONASE) 50 MCG/ACT nasal spray Place 2 sprays into both nostrils  daily. 16 g 6   gabapentin (NEURONTIN) 100 MG capsule Take 2 capsules (200 mg total) by mouth at bedtime. 180 capsule 3   glucose blood (ONE TOUCH ULTRA TEST) test strip Use to test blood sugar three times a week and prn if having symptoms of low blood sugar Dx: 250.92 90 day supply 100 each 11   indapamide (LOZOL) 1.25 MG tablet Take 1 tablet by mouth once daily 90 tablet 0   labetalol (NORMODYNE) 300 MG tablet Take 1 tablet by mouth twice daily 180 tablet 0   Lancets (ONETOUCH DELICA PLUS LANCET33G) MISC USE UP TO 4 TIMES DAILY AS DIRECTED  1   LINZESS 145 MCG CAPS capsule TAKE 1 CAPSULE BY MOUTH ONCE DAILY BEFORE BREAKFAST 90 capsule 0   methylPREDNISolone (MEDROL DOSEPAK) 4 MG TBPK tablet Take as directed 21 tablet 0   omega-3 acid ethyl esters (LOVAZA) 1 g capsule Take 2 capsules (2 g total) by mouth 2 (two) times daily. 360 capsule 1   omeprazole (PRILOSEC) 40 MG capsule TAKE 1 CAPSULE BY MOUTH TWICE DAILY AT  8  AM  AND  10  PM 180 capsule 0   ondansetron (ZOFRAN) 4 MG tablet Take 1 tablet (4 mg total) by mouth every 8 (eight) hours as needed for nausea or vomiting. 30 tablet 4   Rimegepant Sulfate (NURTEC) 75 MG TBDP Take 1 tablet by mouth  daily as needed. 8 tablet 0   RYBELSUS 14 MG TABS Take 1 tablet by mouth once daily 90 tablet 0   simvastatin (ZOCOR) 40 MG tablet TAKE 1 TABLET BY MOUTH AT BEDTIME 90 tablet 0   telmisartan (MICARDIS) 80 MG tablet Take 1 tablet by mouth once daily 90 tablet 1   tizanidine (ZANAFLEX) 2 MG capsule Take 1 capsule (2 mg total) by mouth 3 (three) times daily. 30 capsule 3   No current facility-administered medications for this visit.    PHYSICAL EXAMINATION: ECOG PERFORMANCE STATUS: 1 - Symptomatic but completely ambulatory  Vitals:   11/16/20 1354  BP: (!) 148/89  Pulse: 78  Resp: 18  Temp: (!) 97.3 F (36.3 C)  SpO2: 100%   Filed Weights   11/16/20 1354  Weight: 184 lb 11.2 oz (83.8 kg)    LABORATORY DATA:  I have reviewed the data as  listed CMP Latest Ref Rng & Units 06/08/2020 11/09/2019 09/09/2019  Glucose 70 - 99 mg/dL 134(H) 85 98  BUN 6 - 23 mg/dL $Remove'20 12 19  'NkEhYPB$ Creatinine 0.40 - 1.20 mg/dL 0.97 0.90 1.12  Sodium 135 - 145 mEq/L 138 136 136  Potassium 3.5 - 5.1 mEq/L 3.6 3.9 3.5  Chloride 96 - 112 mEq/L 101 98 101  CO2 19 - 32 mEq/L $Remove'28 27 26  'AKGkuSN$ Calcium 8.4 - 10.5 mg/dL 9.9 10.3 9.6  Total Protein 6.1 - 8.1 g/dL - 7.9 7.8  Total Bilirubin 0.2 - 1.2 mg/dL - 0.4 0.4  Alkaline Phos 39 - 117 U/L - - 71  AST 10 - 35 U/L - 16 17  ALT 6 - 29 U/L - 22 23    Lab Results  Component Value Date   WBC 7.8 11/16/2020   HGB 10.5 (L) 11/16/2020   HCT 31.5 (L) 11/16/2020   MCV 85.6 11/16/2020   PLT 300 11/16/2020   NEUTROABS 4.4 11/16/2020    ASSESSMENT & PLAN:  Normocytic anemia Normocytic anemia hemoglobin 10.5 on 10/27/2017 Lab review: 11/17/2018: Hemoglobin 9.9, MCV 89.2 rest of the CBC is normal 11/10/2019: Hemoglobin 10.4, MCV 86.1 the rest of the CBC is normal 11/17/2019: Hemoglobin 10.5, MCV 88.6 06/08/2020: Hemoglobin 10.6, TSH 2.21, ferritin 104.7, LDH 99 haptoglobin 141, creatinine 0.97 11/16/2020: Hemoglobin 10.5, MCV 85.6   Felt to be related to anemia of chronic disease, possibly related to diabetes and hypertension Extensive work-up previously did not reveal any problems with iron, L79, folic acid and micronutrients Bone marrow biopsy 10/27/2017: Trilineage hematopoiesis, no dyspoiesis.  Cytogenetics were normal   She has 2 sons and 1 son lives in [place] and other sons in [place].  She travels to both this places.  If the hemoglobin stays consistently below 10 then we will consider giving erythropoietin stimulating agents. Plan: Watchful monitoring and follow-up in 1 year     Orders Placed This Encounter  Procedures   CBC with Differential ([facility] Only)    Standing Status:   Future    Standing Expiration Date:   11/16/2021   Retic Panel    Standing Status:   Future    Standing Expiration  Date:   11/16/2021    The patient has a good understanding of the overall plan. she agrees with it. she will call with any problems that may develop before the next visit here.  Total time spent: 20 mins including face to face time and time spent for planning, charting and coordination of care  Rulon Eisenmenger, MD, MPH 11/16/2020  I, Thana Ates, am acting as scribe for Dr. Nicholas Lose.  I have reviewed the above documentation for accuracy and completeness, and I agree with the above.

## 2020-11-15 NOTE — Telephone Encounter (Signed)
Patient aware of results. Will place order for echo.

## 2020-11-15 NOTE — Telephone Encounter (Signed)
-----   Message from Josue Hector, MD sent at 11/14/2020  6:07 PM EDT ----- Normal perfusion but EF estimated at 34% was normal on echo last year ? Accuracy Needs f/u echo to assess with history of DCM

## 2020-11-16 ENCOUNTER — Other Ambulatory Visit: Payer: Self-pay

## 2020-11-16 ENCOUNTER — Inpatient Hospital Stay: Payer: No Typology Code available for payment source

## 2020-11-16 ENCOUNTER — Inpatient Hospital Stay
Payer: No Typology Code available for payment source | Attending: Hematology and Oncology | Admitting: Hematology and Oncology

## 2020-11-16 DIAGNOSIS — D649 Anemia, unspecified: Secondary | ICD-10-CM | POA: Insufficient documentation

## 2020-11-16 DIAGNOSIS — Z79899 Other long term (current) drug therapy: Secondary | ICD-10-CM | POA: Diagnosis not present

## 2020-11-16 DIAGNOSIS — D638 Anemia in other chronic diseases classified elsewhere: Secondary | ICD-10-CM

## 2020-11-16 LAB — IRON AND TIBC
Iron: 55 ug/dL (ref 41–142)
Saturation Ratios: 18 % — ABNORMAL LOW (ref 21–57)
TIBC: 300 ug/dL (ref 236–444)
UIBC: 245 ug/dL (ref 120–384)

## 2020-11-16 LAB — CBC WITH DIFFERENTIAL (CANCER CENTER ONLY)
Abs Immature Granulocytes: 0.02 10*3/uL (ref 0.00–0.07)
Basophils Absolute: 0.1 10*3/uL (ref 0.0–0.1)
Basophils Relative: 1 %
Eosinophils Absolute: 0.2 10*3/uL (ref 0.0–0.5)
Eosinophils Relative: 3 %
HCT: 31.5 % — ABNORMAL LOW (ref 36.0–46.0)
Hemoglobin: 10.5 g/dL — ABNORMAL LOW (ref 12.0–15.0)
Immature Granulocytes: 0 %
Lymphocytes Relative: 34 %
Lymphs Abs: 2.7 10*3/uL (ref 0.7–4.0)
MCH: 28.5 pg (ref 26.0–34.0)
MCHC: 33.3 g/dL (ref 30.0–36.0)
MCV: 85.6 fL (ref 80.0–100.0)
Monocytes Absolute: 0.4 10*3/uL (ref 0.1–1.0)
Monocytes Relative: 5 %
Neutro Abs: 4.4 10*3/uL (ref 1.7–7.7)
Neutrophils Relative %: 57 %
Platelet Count: 300 10*3/uL (ref 150–400)
RBC: 3.68 MIL/uL — ABNORMAL LOW (ref 3.87–5.11)
RDW: 13.5 % (ref 11.5–15.5)
WBC Count: 7.8 10*3/uL (ref 4.0–10.5)
nRBC: 0 % (ref 0.0–0.2)

## 2020-11-16 LAB — RETICULOCYTES
Immature Retic Fract: 18.1 % — ABNORMAL HIGH (ref 2.3–15.9)
RBC.: 3.59 MIL/uL — ABNORMAL LOW (ref 3.87–5.11)
Retic Count, Absolute: 61.7 10*3/uL (ref 19.0–186.0)
Retic Ct Pct: 1.7 % (ref 0.4–3.1)

## 2020-11-16 LAB — FERRITIN: Ferritin: 132 ng/mL (ref 11–307)

## 2020-11-16 NOTE — Assessment & Plan Note (Signed)
Normocytic anemia hemoglobin 10.5 on 10/27/2017 Lab review: 11/17/2018: Hemoglobin 9.9, MCV 89.2 rest of the CBC is normal 11/10/2019: Hemoglobin 10.4, MCV 86.1 the rest of the CBC is normal 11/17/2019: Hemoglobin 10.5, MCV 88.6 06/08/2020: Hemoglobin 10.6, TSH 2.21, ferritin 104.7, LDH 99 haptoglobin 141, creatinine 0.97  Felt to be related to anemia of chronic disease, possibly related to diabetes and hypertension Extensive work-up previously did not reveal any problems with iron, X41, folic acid and micronutrients Bone marrow biopsy 10/27/2017: Trilineage hematopoiesis, no dyspoiesis. Cytogenetics were normal   If the hemoglobinstays consistentlybelow 10 then we will consider giving erythropoietin stimulating agents. Plan: Watchful monitoringand follow-up in 1 year

## 2020-11-23 ENCOUNTER — Other Ambulatory Visit: Payer: Self-pay

## 2020-11-23 ENCOUNTER — Telehealth: Payer: Self-pay

## 2020-11-23 ENCOUNTER — Ambulatory Visit (HOSPITAL_COMMUNITY): Payer: No Typology Code available for payment source | Attending: Cardiology

## 2020-11-23 DIAGNOSIS — R9430 Abnormal result of cardiovascular function study, unspecified: Secondary | ICD-10-CM | POA: Insufficient documentation

## 2020-11-23 DIAGNOSIS — Z79899 Other long term (current) drug therapy: Secondary | ICD-10-CM

## 2020-11-23 LAB — ECHOCARDIOGRAM COMPLETE
Area-P 1/2: 3.06 cm2
Calc EF: 38.3 %
MV M vel: 5.75 m/s
MV Peak grad: 132.3 mmHg
Radius: 0.5 cm
S' Lateral: 4.4 cm
Single Plane A2C EF: 34 %
Single Plane A4C EF: 40.9 %

## 2020-11-23 MED ORDER — SPIRONOLACTONE 25 MG PO TABS: 25.0000 mg | ORAL_TABLET | Freq: Every day | ORAL | 3 refills | Status: DC

## 2020-11-23 MED ORDER — ENTRESTO 24-26 MG PO TABS: 1.0000 | ORAL_TABLET | Freq: Two times a day (BID) | ORAL | 11 refills | Status: DC

## 2020-11-23 NOTE — Telephone Encounter (Signed)
Left message for patient to call back  

## 2020-11-23 NOTE — Telephone Encounter (Signed)
Patient returning call.

## 2020-11-23 NOTE — Telephone Encounter (Signed)
Patient called back. Informed patient of results, medication changes, and future appointments with lab, pharm D, and Dr. Johnsie Cancel. Patient verbalized understanding.

## 2020-11-23 NOTE — Telephone Encounter (Signed)
-----   Message from Josue Hector, MD sent at 11/23/2020 12:57 PM EDT ----- EF is down moderately Nuclear study normal perfusion new diagnosis non ischemic DCM. Stop indapamide and micardis. Start aldactone 25 mg and entresto low dose BMET in a week and f/u Pharm D 3 weeks to titrate meds F/U with me in 3 months

## 2020-11-29 NOTE — Telephone Encounter (Signed)
Called patient. Patient complaining of having pressure on her chest in the mornings and having pain in her shoulders. This does not happen when she is up walking around and being activity. Patient denies any other symptoms related to her heart. Dr. Johnsie Cancel stated that her stress test was fine, no blockages noted and had the low EF, but medications were adjusted and to continue her medications. Patient stated she is not having any issues with her new medications. Patient stated she did get nauseated on the first dose, but was fine after that. Patient is concerned about flying. Encouraged patient that it is okay to fly, but to make sure she gets up and walks every couple of hours to help with blood flow. Patient complained about GI issues, asked patient to follow up with PCP for this. Patient complained of pain in her leg that last only for 3 seconds and is warm and then goes away. Encouraged patient to call her PCP about this as well. Patient will have lab work tomorrow for medications she has started and will see pharmacy on 12/15/20 for follow up. Patient verbalized understanding.

## 2020-11-30 ENCOUNTER — Other Ambulatory Visit: Payer: Self-pay

## 2020-11-30 ENCOUNTER — Other Ambulatory Visit: Payer: No Typology Code available for payment source

## 2020-11-30 DIAGNOSIS — R9430 Abnormal result of cardiovascular function study, unspecified: Secondary | ICD-10-CM

## 2020-11-30 DIAGNOSIS — Z79899 Other long term (current) drug therapy: Secondary | ICD-10-CM

## 2020-11-30 LAB — BASIC METABOLIC PANEL
BUN/Creatinine Ratio: 12 (ref 9–23)
BUN: 10 mg/dL (ref 6–24)
CO2: 20 mmol/L (ref 20–29)
Calcium: 10 mg/dL (ref 8.7–10.2)
Chloride: 100 mmol/L (ref 96–106)
Creatinine, Ser: 0.86 mg/dL (ref 0.57–1.00)
Glucose: 127 mg/dL — ABNORMAL HIGH (ref 65–99)
Potassium: 4.5 mmol/L (ref 3.5–5.2)
Sodium: 138 mmol/L (ref 134–144)
eGFR: 78 mL/min/{1.73_m2} (ref >59)

## 2020-11-30 MED ORDER — LABETALOL HCL 300 MG PO TABS: 300.0000 mg | ORAL_TABLET | Freq: Two times a day (BID) | ORAL | 3 refills | Status: DC

## 2020-12-05 NOTE — Progress Notes (Signed)
[name] [name] Phone: [phone] Subjective:   Carolyn [name], am serving as a scribe for Dr. Hulan Saas.  I'm seeing this patient by the request  of:  Carolyn Lima, MD  CC: Left shoulder pain  [id number]  Carolyn [name] is a 60 y.o. female coming in with complaint of back and neck pain. OMT 10/26/2020. Patient states that her back is doing well, but her L arm is still bothering her, pain is located front of shoulder radiates down the bicep, describes pain as aching. Patient wanted to talk about her new heart condition and if she is still able to get OMT.  Reviewing patient's chart it does appear that patient was diagnosed with more of diastolic cardiomyopathy.  Patient does have a decrease in ejection fraction to 35%.  Patient states that the left shoulder comes and goes.  Does not know if it is associated with her heart.  Some mild radiation towards her neck from time to time.  Does not know if it she can have it with certain movements or if it is only with walking.  Patient denies any worsening shortness of breath.  Medications patient has been prescribed: None          Past Medical History:  Diagnosis Date   Allergy    seasonal   Anemia    Anemia of chronic disease suspected   Anxiety attack    Arthritis    Benign fundic gland polyps of stomach    Dyslipidemia    Exertional shortness of breath    GERD (gastroesophageal reflux disease)    Heart murmur    Hyperlipidemia    Hypertension    Migraines    "probably weekly" (01/14/2013)   Type II diabetes mellitus (HCC)     Allergies  Allergen Reactions   Invokana [Canagliflozin] Other (See Comments)    YEAST INFECTIONS   Codeine Nausea And Vomiting   Flexeril [Cyclobenzaprine]     sedation   Morphine And Related Itching   Reglan [Metoclopramide] Other (See Comments)    "paralyzes me"   Silicon     In watch bands     Review of  Systems:  No headache, visual changes, nausea, vomiting, diarrhea, constipation, dizziness, abdominal pain, skin rash, fevers, chills, night sweats, weight loss, swollen lymph nodes, body aches, joint swelling, chest pain, shortness of breath, mood changes. POSITIVE muscle aches  Objective  Blood pressure 130/84, pulse 84, height '5\' 4"'$  (1.626 m), weight 185 lb (83.9 kg).   General: No apparent distress alert and oriented x3 mood and affect normal, dressed appropriately.  HEENT: Pupils equal, extraocular movements intact  Respiratory: Patient's speak in full sentences and does not appear short of breath  Cardiovascular: No lower extremity edema, non tender, no erythema  Neuro: Cranial nerves II through XII are intact, neurovascularly intact in all extremities with 2+ DTRs and 2+ pulses.  Gait normal with good balance and coordination.  Left shoulder exam does have a positive impingement with Neer and Hawkins.  Rotator cuff strength though does appear to be intact.  Patient does have some mild limited internal and external range of motion of 2 to 5 degrees.  Patient's neck does show some tightness.  No true radicular symptoms.   [id number]; 15 additional minutes spent for Therapeutic exercises as stated in above notes.  This included exercises focusing on stretching, strengthening, with significant focus on eccentric aspects.   Long term goals  include an improvement in range of motion, strength, endurance as well as avoiding reinjury. Patient's frequency would include in 1-2 times a day, 3-5 times a week for a duration of 6-12 weeks. Shoulder Exercises that included:  Basic scapular stabilization to include adduction and depression of scapula Scaption, focusing on proper movement and good control Internal and External rotation utilizing a theraband, with elbow tucked at side entire time Rows with theraband    Proper technique shown and discussed handout in great detail with ATC.  All questions were  discussed and answered.         Assessment and Plan:          The above documentation has been reviewed and is accurate and complete Lyndal Pulley, DO       Note: This dictation was prepared with Dragon dictation along with smaller phrase technology. Any transcriptional errors that result from this process are unintentional.

## 2020-12-06 ENCOUNTER — Ambulatory Visit (INDEPENDENT_AMBULATORY_CARE_PROVIDER_SITE_OTHER): Payer: No Typology Code available for payment source | Admitting: Family Medicine

## 2020-12-06 ENCOUNTER — Ambulatory Visit (INDEPENDENT_AMBULATORY_CARE_PROVIDER_SITE_OTHER): Payer: No Typology Code available for payment source

## 2020-12-06 ENCOUNTER — Other Ambulatory Visit: Payer: Self-pay

## 2020-12-06 VITALS — BP 130/84 | HR 84 | Ht 64.0 in | Wt 185.0 lb

## 2020-12-06 DIAGNOSIS — M25512 Pain in left shoulder: Secondary | ICD-10-CM

## 2020-12-06 DIAGNOSIS — M7552 Bursitis of left shoulder: Secondary | ICD-10-CM

## 2020-12-06 NOTE — Patient Instructions (Signed)
Good to see you  Get x ray on your way out Continue with baby aspirin Follow up with cardiologist If worsening chest pain with radiating pain down the arm or up the neck seek medical attention immediately Exercises given  See me again in 6-8 weeks for follow up of left shoulder

## 2020-12-06 NOTE — Assessment & Plan Note (Signed)
Patient has had a history of the chronic bursitis of the left shoulder.  Discussed with patient that I do think that a possible injection could be helpful.  Patient has not been doing the exercises regularly and we will get x-ray to further evaluate.  Discussed with patient also having some the recent work-up of the cardiac issues of signs and symptoms and when to seek medical attention.  Patient planned.  Patient will follow up with her cardiologist as scheduled and as well.  Follow-up with me again for the shoulder in 4 to 6 weeks.  Can consider the possibility of starting manipulation at that point as well if needed.  Total time is speaking with patient today, describing some of the cardiology aspect, as well as reviewing patient's cardiology notes greater than 34 minutes.

## 2020-12-14 ENCOUNTER — Ambulatory Visit
Admission: RE | Admit: 2020-12-14 | Discharge: 2020-12-14 | Disposition: A | Discharge status: Home / Self Care | Payer: No Typology Code available for payment source | Source: Ambulatory Visit | Attending: Internal Medicine | Admitting: Internal Medicine

## 2020-12-14 DIAGNOSIS — Z1231 Encounter for screening mammogram for malignant neoplasm of breast: Secondary | ICD-10-CM

## 2020-12-15 ENCOUNTER — Telehealth: Payer: Self-pay

## 2020-12-15 ENCOUNTER — Ambulatory Visit (INDEPENDENT_AMBULATORY_CARE_PROVIDER_SITE_OTHER): Payer: No Typology Code available for payment source | Admitting: Pharmacist

## 2020-12-15 ENCOUNTER — Other Ambulatory Visit: Payer: Self-pay

## 2020-12-15 VITALS — BP 148/94 | HR 82 | Wt 184.0 lb

## 2020-12-15 DIAGNOSIS — E781 Pure hyperglyceridemia: Secondary | ICD-10-CM | POA: Diagnosis not present

## 2020-12-15 DIAGNOSIS — R931 Abnormal findings on diagnostic imaging of heart and coronary circulation: Secondary | ICD-10-CM | POA: Diagnosis not present

## 2020-12-15 DIAGNOSIS — I10 Essential (primary) hypertension: Secondary | ICD-10-CM

## 2020-12-15 MED ORDER — ENTRESTO 49-51 MG PO TABS: 1.0000 | ORAL_TABLET | Freq: Two times a day (BID) | ORAL | 11 refills | Status: DC

## 2020-12-15 MED ORDER — OMEGA-3-ACID ETHYL ESTERS 1 G PO CAPS: 2.0000 g | ORAL_CAPSULE | Freq: Two times a day (BID) | ORAL | 0 refills | Status: DC

## 2020-12-15 NOTE — Telephone Encounter (Signed)
**Note De-Identified  Obfuscation** Entresto PA started through covermymeds. Key: [id number]

## 2020-12-15 NOTE — Telephone Encounter (Signed)
Left voicemail, call to discuss PREP program referral.

## 2020-12-15 NOTE — Telephone Encounter (Signed)
Returned call, wants to participate in PREP, closer to [employer]. Next classes will start in October, will contact her later this month once schedule is set; she inquired about ways to help lower her BP before classes start, advised exercise, reducing NA, decreasing/eliminating processed foods, keeping added sugars to 24gms a day, sent her DASH diet info via email
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
## 2020-12-15 NOTE — Patient Instructions (Addendum)
Please check your blood pressure at home once a day Please record readings and bring them with you along with your meter  Increase your Entresto to 49/'51mg'$  twice a day. Continue labetalol '300mg'$  twice a day and spironolactone '25mg'$  daily.  Call me at [phone] with any questions

## 2020-12-20 ENCOUNTER — Other Ambulatory Visit: Payer: Self-pay

## 2020-12-20 ENCOUNTER — Ambulatory Visit (INDEPENDENT_AMBULATORY_CARE_PROVIDER_SITE_OTHER): Payer: No Typology Code available for payment source | Admitting: Pharmacist

## 2020-12-20 DIAGNOSIS — R931 Abnormal findings on diagnostic imaging of heart and coronary circulation: Secondary | ICD-10-CM | POA: Diagnosis not present

## 2020-12-20 DIAGNOSIS — I10 Essential (primary) hypertension: Secondary | ICD-10-CM

## 2020-12-20 DIAGNOSIS — E118 Type 2 diabetes mellitus with unspecified complications: Secondary | ICD-10-CM

## 2020-12-20 DIAGNOSIS — E785 Hyperlipidemia, unspecified: Secondary | ICD-10-CM | POA: Diagnosis not present

## 2020-12-20 LAB — BASIC METABOLIC PANEL
BUN/Creatinine Ratio: 13 (ref 9–23)
BUN: 13 mg/dL (ref 6–24)
CO2: 23 mmol/L (ref 20–29)
Calcium: 10.2 mg/dL (ref 8.7–10.2)
Chloride: 101 mmol/L (ref 96–106)
Creatinine, Ser: 1.01 mg/dL — ABNORMAL HIGH (ref 0.57–1.00)
Glucose: 100 mg/dL — ABNORMAL HIGH (ref 65–99)
Potassium: 4 mmol/L (ref 3.5–5.2)
Sodium: 140 mmol/L (ref 134–144)
eGFR: 65 mL/min/{1.73_m2} (ref >59)
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
## 2020-12-20 NOTE — Patient Instructions (Signed)
I will call you with your lab results. The plan will be to increase Entresto to 97/'103mg'$    Call me at [phone] with any questions  Taco seasoning recipe 2 tablespoons ancho chili powder 1 tablespoon plus 1 teaspoon onion powder 1 tablespoon cumin 2 teaspoons paprika 2 teaspoon garlic powder 1 teaspoon ground oregano (or 1  teaspoon flakes)  teaspoon cayenne pepper (or pinch)  Your blood pressure reading consists of two numbers. Ideally, blood pressure should be  below 120/80. The first ("top") number is called the systolic pressure. It measures the  pressure in your arteries as your heart beats. The second ("bottom") number is called the diastolic pressure. It measures the pressure in your arteries as the heart relaxes between beats.  The benefits of getting your blood pressure under control are enormous. A 10-point  reduction in systolic blood pressure can reduce your risk of stroke by 27% and heart failure by 28%  Your blood pressure goal is <130/80  To check your pressure at home you will need to:  1. Sit up in a chair, with feet flat on the floor and back supported. Do not cross your ankles or legs. 2. Rest your left arm so that the cuff is about heart level. If the cuff goes on your upper arm,  then just relax the arm on the table, arm of the chair or your lap. If you have a wrist cuff, we  suggest relaxing your wrist against your chest (think of it as Pledging the Flag with the  wrong arm).  3. Place the cuff snugly around your arm, about 1 inch above the crook of your elbow. The  cords should be inside the groove of your elbow.  4. Sit quietly, with the cuff in place, for about 5 minutes. After that 5 minutes press the power  button to start a reading. 5. Do not talk or move while the reading is taking place.  6. Record your readings on a sheet of paper. Although most cuffs have a memory, it is often  easier to see a pattern developing when the numbers are all in  front of you.  7. You can repeat the reading after 1-3 minutes if it is recommended  Make sure your bladder is empty and you have not had caffeine or tobacco within the last 30 min  Always bring your blood pressure log with you to your appointments. If you have not brought your monitor in to be double checked for accuracy, please bring it to your next appointment.  You can find a list of validated (accurate) blood pressure cuffs at PopPath.it  Healthy Diet  SALT  What is the big deal with sodium? Why the need to limit our intake? What is the connection to  blood pressure? And what is the difference between salt and sodium? Sodium attracts water. Think about it. When you eat an overly salty snack, you tend to become  thirsty and need more water. If you have too much sodium in your bloodstream, your body will  then pull water into the bloodstream as well, trying to correct the imbalance. When you have  more volume in the bloodstream, your blood pressure goes up. Your heart must work harder to  pump the extra volume, and the increase in pressure can wear out blood vessels faster. You may  also notice bloating and weight gain. Hypertension is one of the leading risk factors for heart  disease. By limiting sodium intake throughout life you are  helping decrease your risk of heart  disease later on.   Salt is made up of two minerals. Sodium and chloride. A teaspoon of salt contains about 40%  sodium and 60% chloride. One teaspoon of salt has 2,300 mg of sodium. While the current  USDA guideline states you should consume no more than 2,300 mg per day, both they and the  American Heart Association recommend that you limit this to 1,500 mg to stay healthy. Sea salt  or [place]ney pink salt may have a slightly different taste, but they still have almost the same  percent of sodium per teaspoon. So feel free to use them instead of table salt, but don't use  more.  A common myth is that if you  don't add salt to your food, you are following a low sodium diet.  However, 75% of the sodium consumed in the American diet is from processed foods, NOT the  salt shaker. We all know that chips and crackers are high in sodium, but there are many other  foods that we may not think of when limiting our sodium. Below are the "salty six" foods that  the American Heart Association wants you to be aware of. 1. Cold cuts - even the healthy sliced [place] can have over 1,000 mg of sodium per slice.   Compare different brands to see which has less sodium if you eat these regularly 2. Pizza - depending on your toppings, a slice of pizza can have up to 760 mg of   sodium. Put more veggies on it or just have a slice with a side salad and still enjoy. 3. Soup - yes even that old home remedy of chicken soup is loaded with sodium. Look   for low sodium versions. Or add a bunch of frozen veggies when heating it, this will   give you less sodium per serving 4. Breads - they may not taste salty, but a single slice of bread can have up to 230 mg of   sodium. Toast for breakfast, a sandwich at lunch and a dinner roll can quickly add up   to over 900 mg in just one day.  5. Chicken - some fresh or frozen chicken is injected with a sodium solution before it   reaches the store. A 4 oz serving should have no more than 100 mg sodium. And   watch for breaded frozen chicken nuggets, strips and tenders. They may seem like a   quick and easy "healthy" meal, but they have high amounts of sodium as well. 6. Burritos/tacos - just 2 teaspoons of taco seasoning can have over 400 mg sodium.   Try making your own with equal parts of cumin, oregano, chili powder and garlic powder.   SUGAR  Sugar is a huge problem in the modern day diet. Sugar is a HUGE contributor to heart disease, diabetes, high triglyceride levels, fatty liver diease and obesity. Sugar is hidden in almost all packaged foods/beverages. It adds no nutritional  benefit to your body and can cause major harm. Added sugar is extra sugar that is added beyond what is naturally found. The American Heart Association recommends limiting added sugars to no more than 25g for women and 36 grams for men per day.  There are many names for sugar maltose, sucrose (names ending in "ose"), high fructose corn syrup, molasses, cane sugar, corn sweetener, raw sugar, syrup, honey or fruit juice concentrate.   One of the best ways to limit your added  sugars is to stop drinking sweetened beverages such as soda, sweet tea, fruit juice or fancy coffee's. There is 65g of added sugars in one 20oz bottle of Coke!! That is equal to 6 donuts.   Pay attention and read all nutrition facts labels. Below is an examples of a nutrition facts label. The #1 is showing you the total sugars where the # 2 is showing you the added sugars. This one severing has almost the max amount of added sugars per day!  Watch out for items that say "low fat" or "no added sugar" as these products are typically very high in sugar. The food industry uses these terms to fool you into thinking they are healthy.  For more information on the dangers of sugar watch WHY Sugar is as Bad as Alcohol (Fructose, The Liver Toxin) on YouTube.    EXERCISE  Exercise can help lower your blood pressure ~5 points systolic (top #) and 8 points diastolic (bottom #)  Exercise is good. We've all heard that. In an ideal world, we would all have time and resources to  get plenty of it. When you are active your heart pumps more efficiently and you will feel better.  Multiple studies show that even walking regularly has benefits that include living a longer life.  The American Heart Association recommends 90-150 minutes per week of exercise (30 minutes  per day most days of the week). You can do this in any increment you wish. Nine or more  10-minute walks count. So does an hour-long exercise class. Break the time apart into what  will  work in your life. Some of the best things you can do include walking briskly, jogging, cycling or  swimming laps. Not everyone is ready to "exercise." Sometimes we need to start with just getting active. Here  are some easy ways to be more active throughout the day:  Take the stairs instead of the elevator  Go for a 10-15 minute walk during your lunch break (find a friend to make it more enjoyable)  When shopping, park at the back of the parking lot  If you take public transportation, get off one stop early and walk the extra distance  Pace around while making phone calls (most of [place] are not attached to phone cords any longer!) Check with your doctor if you aren't sure what your limitations may be. Always remember to drink plenty of water when doing any type of exercise. Don't feel like a failure if you're not getting the 90-150 minutes per week. If you started by being  a couch potato, then just a 10-minute walk each day is a huge improvement. Start with little  victories and work your way up.   Healthy Eating Tips  When looking to improve your eating habits, whether to lose weight, lower blood pressure or just be healthier, it helps to know what a serving size is.   Grains 1 slice of bread,  bagel,  cup pasta or rice  Vegetables 1 cup fresh or raw vegetables,  cup cooked or canned Fruits 1 piece of medium sized fruit,  cup canned,   Meats/Proteins  cup dried       1 oz meat, 1 egg,  cup cooked beans, nuts or seeds  Dairy        Fats Individual yogurt container, 1 cup (8oz)    1 teaspoon margarine/butter or vegetable  milk or milk alternative, 1 slice of cheese  oil; 1 tablespoon mayonnaise or salad dressing                  Plan ahead: make a menu of the meals for a week then create a grocery list to go with  that menu. Consider meals that easily stretch into a night of leftovers, such as stews or  casseroles. Or consider making two of your favorite meal and  put one in the freezer or fridge for  another night.  When you get home from the grocery store wash and prepare your vegetables and fruits.  Then when you need them they are ready to go.  Tips for going to the grocery store:  [place] store or generic brands  Check the weekly ad from your store on-line or in their in-store flyer  Look at the unit price on the shelf tag to compare/contrast the costs of different items  Buy fruits/vegetables in season  Carrots, bananas and apples are low-cost, naturally healthy items  If meats or frozen vegetables are on sale, buy some extras and put in your freezer  Limit buying prepared or "ready to eat" items, even if they are pre-made salads or fruit snacks  Do not shop when you're hungry  Foods at eye level tend to be more expensive. Look on the high and low shelves for deals.  Consider shopping at the farmer's market for fresh foods in season.  Choose canned tuna or salmon instead of fresh  Avoid the cookie and chip aisles (these are expensive, high in calories and low in  nutritional value) Healthy food preparations:  If you can't get lean hamburger, be sure to drain the fat when cooking  Steam, saut (in olive oil), grill or bake foods  Experiment with different seasonings to avoid adding salt to your foods. Kosher salt, sea salt and Rosebud salt are all still salt and should be avoided         Aim to have one 12 hour fast each day. This means no eating after dinner until breakfast. For example, if you eat dinner around 6 PM then you would not eat anything until 6 AM the next day. This is a great way to help lower your insulin levels, lose weight and reduce your blood pressure.  Resources: American Heart Association - InstantFinish.fi Go to the Healthy Living tab to get more information American Diabetes Association - www.diabetes.org You don't have to be diabetic - check out the Food and Fitness tab  DASH diet - https://wilson-eaton.com/ Health  topics - or just search on their home page for DASH Quit for Life - www.cancer.org Follow the Stay Healthy tab to learn more about smoking cessatio

## 2020-12-21 ENCOUNTER — Telehealth: Payer: Self-pay | Admitting: Pharmacist

## 2020-12-21 ENCOUNTER — Other Ambulatory Visit: Payer: Self-pay | Admitting: Pharmacist

## 2020-12-21 MED ORDER — ENTRESTO 97-103 MG PO TABS: 1.0000 | ORAL_TABLET | Freq: Two times a day (BID) | ORAL | 11 refills | Status: DC

## 2020-12-21 MED ORDER — OZEMPIC (0.25 OR 0.5 MG/DOSE) 2 MG/1.5ML ~~LOC~~ SOPN: 0.2500 mg | PEN_INJECTOR | SUBCUTANEOUS | 0 refills | Status: DC

## 2020-12-21 NOTE — Telephone Encounter (Signed)
Labs stable from baseline. Will increase Entresto to 97/'103mg'$  BID

## 2020-12-26 ENCOUNTER — Telehealth: Payer: Self-pay | Admitting: Internal Medicine

## 2020-12-26 DIAGNOSIS — Z0279 Encounter for issue of other medical certificate: Secondary | ICD-10-CM

## 2020-12-26 NOTE — Telephone Encounter (Signed)
Form has been completed, placed of PCPs desk for review and signature.

## 2020-12-26 NOTE — Telephone Encounter (Signed)
Type of form received- FMLA  Form placed in- Provider mailbox  Additional instructions from the patient - fax, notify by phone when complete   Things to remember: [place] office: If form received in person, remind patient that forms take 7-10 business days CMA should attach charge sheet and put on Supervisor's desk

## 2020-12-26 NOTE — Telephone Encounter (Signed)
Forms have been signed and faxed.  Copy sent to scan.  Copy given to charge. Copy filed with CMA  Pt informed and original sent to her via mail.

## 2020-12-29 DIAGNOSIS — Z0279 Encounter for issue of other medical certificate: Secondary | ICD-10-CM

## 2020-12-29 MED ORDER — ICOSAPENT ETHYL 1 G PO CAPS: 2.0000 g | ORAL_CAPSULE | Freq: Two times a day (BID) | ORAL | 3 refills | Status: DC

## 2021-01-01 ENCOUNTER — Ambulatory Visit (INDEPENDENT_AMBULATORY_CARE_PROVIDER_SITE_OTHER): Payer: No Typology Code available for payment source | Admitting: Pharmacist

## 2021-01-01 ENCOUNTER — Ambulatory Visit: Payer: No Typology Code available for payment source

## 2021-01-01 ENCOUNTER — Telehealth: Payer: Self-pay | Admitting: Cardiovascular Disease

## 2021-01-01 ENCOUNTER — Other Ambulatory Visit: Payer: Self-pay

## 2021-01-01 VITALS — BP 124/86 | HR 87 | Wt 177.0 lb

## 2021-01-01 DIAGNOSIS — E118 Type 2 diabetes mellitus with unspecified complications: Secondary | ICD-10-CM | POA: Diagnosis not present

## 2021-01-01 DIAGNOSIS — Z794 Long term (current) use of insulin: Secondary | ICD-10-CM

## 2021-01-01 DIAGNOSIS — I10 Essential (primary) hypertension: Secondary | ICD-10-CM | POA: Diagnosis not present

## 2021-01-01 LAB — BASIC METABOLIC PANEL
BUN/Creatinine Ratio: 15 (ref 9–23)
BUN: 16 mg/dL (ref 6–24)
CO2: 23 mmol/L (ref 20–29)
Calcium: 9.2 mg/dL (ref 8.7–10.2)
Chloride: 102 mmol/L (ref 96–106)
Creatinine, Ser: 1.04 mg/dL — ABNORMAL HIGH (ref 0.57–1.00)
Glucose: 262 mg/dL — ABNORMAL HIGH (ref 65–99)
Potassium: 4.4 mmol/L (ref 3.5–5.2)
Sodium: 137 mmol/L (ref 134–144)
eGFR: 62 mL/min/{1.73_m2} (ref >59)

## 2021-01-01 MED ORDER — ISOSORBIDE DINITRATE 20 MG PO TABS: 20.0000 mg | ORAL_TABLET | Freq: Three times a day (TID) | ORAL | 3 refills | Status: DC

## 2021-01-01 MED ORDER — BD PEN NEEDLE MICRO U/F 32G X 6 MM MISC: 1.0000 [IU] | 3 refills | Status: AC

## 2021-01-01 MED ORDER — HYDRALAZINE HCL 25 MG PO TABS: 25.0000 mg | ORAL_TABLET | Freq: Three times a day (TID) | ORAL | 3 refills | Status: DC
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
## 2021-01-01 NOTE — Telephone Encounter (Signed)
[facility] received FMLA paper work from [place], payment from patient. Placed form in Dr. Kyla Balzarine box for completion. 01/01/21  [facility]LM

## 2021-01-01 NOTE — Patient Instructions (Addendum)
You may eat up to 2300mg  of sodium per day  Decrease clonidine to 0.1mg  twice a day  Start taking hydralazine 25mg  three times a day and isosorbide dinitrate 20mg  three times a day  STOP Rybelses the day before you start Ozempic 0.5mg  WEEKLY  Eat, Drink, and Be Healthy: The [employer] Guide to [employer]

## 2021-01-03 ENCOUNTER — Telehealth: Payer: Self-pay

## 2021-01-03 NOTE — Telephone Encounter (Addendum)
Patient recently had a lexiscan on 11/14/20. Patient stated her chest pain was a lot worse on Sunday. Patient stated she felt extremely tired and just felt "weird" in general, but now it just feels a little sore on the right side towards the center of her chest. Patient stated after using the bathroom on Sunday her chest pain felt a little better. Patient is wondering if it is GI related. Patient stated her last BP was 124/86. Patient stated she has lost 9 pounds and not sure if that is a good thing. Patient is eating better and trying to stay away from salt and fatty foods. Will forward to Dr. Johnsie Cancel for advisement.

## 2021-01-03 NOTE — Telephone Encounter (Signed)
-----   Message from Josue Hector, MD sent at 01/01/2021  1:26 PM EDT ----- She had a normal cath in 2014 Can order a lexiscan myovue study and f/u with PA  ----- Message ----- From: Ramond Dial, RPH-CPP Sent: 01/01/2021  11:35 AM EDT To: Josue Hector, MD  Please review -  last night she had an episode where the center of her chest hurt, she had pain in both her right and left shoulder. States she tried to "walk it off." She had driven her son to [place] to the airport earlier that day. States today she is sore and feel weak. States she's not sure if it was gas, anxiety or her heart. Breathing was fine. BP that night was high 146/102. She eventually fell asleep.

## 2021-01-03 NOTE — Telephone Encounter (Signed)
**Note De-Identified  Obfuscation** Emilia Beck Key: [id number] - PA Case ID: [id number] - Rx #: [id number] Outcome: Approved on September 2 ENTRESTO TAB 49-51MG  is approved through 12/15/2021. Your patient may now fill this prescription and it will be covered. Drug: [name] 49-51MG  tablets Form: [facility] Electronic Prior Authorization Form (2017 NCPDP) Original Claim Info 75  I have notified [facility] of this approval

## 2021-01-04 NOTE — Telephone Encounter (Signed)
Called patient to let her know Dr. Kyla Balzarine recommendations. Per Dr. Johnsie Cancel, pains don't sound cardiac, continue current meds, and myoview was non ischemic. Patient verbalized understanding.

## 2021-01-05 ENCOUNTER — Telehealth: Payer: Self-pay | Admitting: Pharmacist

## 2021-01-05 DIAGNOSIS — E781 Pure hyperglyceridemia: Secondary | ICD-10-CM

## 2021-01-05 MED ORDER — FENOFIBRATE 145 MG PO TABS: 145.0000 mg | ORAL_TABLET | Freq: Every day | ORAL | 5 refills | Status: DC

## 2021-01-05 MED ORDER — OMEGA-3-ACID ETHYL ESTERS 1 G PO CAPS: 2.0000 g | ORAL_CAPSULE | Freq: Two times a day (BID) | ORAL | 5 refills | Status: DC

## 2021-01-05 NOTE — Telephone Encounter (Signed)
Pt left voicemail stating her Vascepa is $100. Called pharmacy, they were running rx as generic, doesn't look like DAW was selected on rx. Copay for generic IPE is $100/1 month.  I advised them to change rx to brand, copay was $300 for 1 month, I provided them with a copay card which brought down cost to only $150/month and is still cost prohibitive.  Will have pt change back to Lovaza 2g BID due to cost. Will also add fenofibrate 145mg  daily due to TG ~400 already on high dose fish oil. Continue on simvastatin, recheck lipids in 2 months. Responded to pt in MyChart message.

## 2021-01-08 ENCOUNTER — Other Ambulatory Visit: Payer: Self-pay | Admitting: Internal Medicine

## 2021-01-08 ENCOUNTER — Telehealth: Payer: Self-pay | Admitting: Cardiovascular Disease

## 2021-01-08 DIAGNOSIS — K5904 Chronic idiopathic constipation: Secondary | ICD-10-CM

## 2021-01-08 NOTE — Addendum Note (Signed)
Addended by: ,  E on: 01/08/2021 07:39 AM   Modules accepted: Orders

## 2021-01-08 NOTE — Telephone Encounter (Signed)
Form completed by Dr. Johnsie Cancel and faxed to [facility] on 01/08/2021. Patient has been notified to pick up original paper work. JMM 01/08/2021

## 2021-01-10 ENCOUNTER — Other Ambulatory Visit: Payer: Self-pay

## 2021-01-10 ENCOUNTER — Telehealth: Payer: Self-pay | Admitting: Cardiovascular Disease

## 2021-01-10 NOTE — Telephone Encounter (Signed)
FMLA paper work needs this question answered by the doctor.  What would be the duration the patient would be out for non ischemic DCM and frequency within a years time frame?  Will forward to Dr. Johnsie Cancel for answer.

## 2021-01-10 NOTE — Telephone Encounter (Signed)
Called patient back. Patient wanted to let Melissa the pharmacist know that she took labetalol 300 mg by mouth TID instead of BID on accident. She stated her BP dropped to 119/68 and 118/65 during this time. She has since gone back to BID and today her BP is 130/88 and HR 91.

## 2021-01-10 NOTE — Telephone Encounter (Signed)
Helene Kelp with [employer] is following up regarding patient's FMLA paperwork. She states she would like clarification on question 7 and 8. Please return call to discuss.  Phone #: [phone]

## 2021-01-11 ENCOUNTER — Encounter: Payer: Self-pay | Admitting: Internal Medicine

## 2021-01-11 ENCOUNTER — Ambulatory Visit (INDEPENDENT_AMBULATORY_CARE_PROVIDER_SITE_OTHER): Payer: No Typology Code available for payment source | Admitting: Internal Medicine

## 2021-01-11 VITALS — BP 132/78 | HR 88 | Temp 98.1°F | Ht 64.0 in | Wt 180.0 lb

## 2021-01-11 DIAGNOSIS — E118 Type 2 diabetes mellitus with unspecified complications: Secondary | ICD-10-CM | POA: Diagnosis not present

## 2021-01-11 DIAGNOSIS — I10 Essential (primary) hypertension: Secondary | ICD-10-CM

## 2021-01-11 DIAGNOSIS — E781 Pure hyperglyceridemia: Secondary | ICD-10-CM | POA: Diagnosis not present

## 2021-01-11 DIAGNOSIS — Z0000 Encounter for general adult medical examination without abnormal findings: Secondary | ICD-10-CM

## 2021-01-11 DIAGNOSIS — D538 Other specified nutritional anemias: Secondary | ICD-10-CM | POA: Diagnosis not present

## 2021-01-11 DIAGNOSIS — Z23 Encounter for immunization: Secondary | ICD-10-CM

## 2021-01-11 DIAGNOSIS — E519 Thiamine deficiency, unspecified: Secondary | ICD-10-CM

## 2021-01-11 LAB — URINALYSIS, ROUTINE W REFLEX MICROSCOPIC
Bilirubin Urine: NEGATIVE
Hgb urine dipstick: NEGATIVE
Ketones, ur: NEGATIVE
Leukocytes,Ua: NEGATIVE
Nitrite: NEGATIVE
RBC / HPF: NONE SEEN (ref 0–?)
Specific Gravity, Urine: 1.02 (ref 1.000–1.030)
Total Protein, Urine: 100 — AB
Urine Glucose: NEGATIVE
Urobilinogen, UA: 0.2 (ref 0.0–1.0)
pH: 6 (ref 5.0–8.0)

## 2021-01-11 LAB — MICROALBUMIN / CREATININE URINE RATIO
Creatinine,U: 137.9 mg/dL
Microalb Creat Ratio: 40.7 mg/g — ABNORMAL HIGH (ref 0.0–30.0)
Microalb, Ur: 56.1 mg/dL — ABNORMAL HIGH (ref 0.0–1.9)

## 2021-01-11 LAB — LIPID PANEL
Cholesterol: 143 mg/dL (ref 0–200)
HDL: 31.4 mg/dL — ABNORMAL LOW (ref >39.00)
LDL Cholesterol: 77 mg/dL (ref 0–99)
NonHDL: 111.5
Total CHOL/HDL Ratio: 5
Triglycerides: 171 mg/dL — ABNORMAL HIGH (ref 0.0–149.0)
VLDL: 34.2 mg/dL (ref 0.0–40.0)

## 2021-01-11 LAB — HEMOGLOBIN A1C: Hgb A1c MFr Bld: 6.5 % (ref 4.6–6.5)

## 2021-01-11 NOTE — Progress Notes (Signed)
Subjective:  Patient ID: Carolyn [name], female    DOB: 1960/10/11  Age: 60 y.o. MRN: [id number]  CC: Hypertension, Diabetes, Anemia, and Hyperlipidemia  This visit occurred during the SARS-CoV-2 public health emergency.  Safety protocols were in place, including screening questions prior to the visit, additional usage of staff PPE, and extensive cleaning of exam room while observing appropriate contact time as indicated for disinfecting solutions.    HPI Carolyn [name] presents for f/up -   She complains of chronic fatigue and back pain.  She has had some weight gain.  She denies chest pain, shortness of breath, diaphoresis, palpitations, or edema.  Recent cardiac work-up revealed an ejection fraction of about 37%.  Outpatient Medications Prior to Visit  Medication Sig Dispense Refill   Ascorbic Acid (VITAMIN C) 1000 MG tablet Take 1,500 mg by mouth daily.     blood glucose meter kit and supplies KIT Dispense based on patient and insurance preference. Use up to four times daily as directed. (FOR ICD-9 250.00, 250.01). 1 each 0   Blood Glucose Monitoring Suppl (ONETOUCH VERIO IQ SYSTEM) W/DEVICE KIT 1 Act by Does not apply route 3 (three) times daily. 2 kit 0   calcium carbonate (OS-CAL - DOSED IN MG OF ELEMENTAL CALCIUM) 1250 (500 Ca) MG tablet Take 2 tablets by mouth daily with breakfast.     cholecalciferol (VITAMIN D3) 25 MCG (1000 UNIT) tablet Take 2,000 Units by mouth daily.     clonazePAM (KLONOPIN) 1 MG tablet Take 1 tablet (1 mg total) by mouth at bedtime. 90 tablet 1   cloNIDine (CATAPRES) 0.1 MG tablet Take 1 tablet (0.1 mg total) by mouth 2 (two) times daily. 270 tablet 1   Diclofenac Sodium 2 % SOLN Place 2 g onto the skin 2 (two) times daily. 112 g 3   EPINEPHrine 0.3 mg/0.3 mL IJ SOAJ injection INJECT 0.3 MLS INTO THE MUSCLE ONCE FOR 1 DOSE  0   fenofibrate (TRICOR) 145 MG tablet Take 1 tablet (145 mg total) by mouth daily. 30 tablet 5   Fexofenadine HCl (ALLEGRA ALLERGY  PO) Take by mouth.     Flaxseed, Linseed, (FLAX SEED OIL) 1000 MG CAPS Take by mouth.     fluticasone (FLONASE) 50 MCG/ACT nasal spray Place 2 sprays into both nostrils daily. 16 g 6   gabapentin (NEURONTIN) 100 MG capsule Take 2 capsules (200 mg total) by mouth at bedtime. 180 capsule 3   glucose blood (ONE TOUCH ULTRA TEST) test strip Use to test blood sugar three times a week and prn if having symptoms of low blood sugar Dx: 250.92 90 day supply 100 each 11   hydrALAZINE (APRESOLINE) 25 MG tablet Take 1 tablet (25 mg total) by mouth 3 (three) times daily. 270 tablet 3   Insulin Pen Needle (BD PEN NEEDLE MICRO U/F) 32G X 6 MM MISC Inject 1 Units into the skin once a week. 100 each 3   iron polysaccharides (NIFEREX) 150 MG capsule Take 150 mg by mouth daily.     isosorbide dinitrate (ISORDIL) 20 MG tablet Take 1 tablet (20 mg total) by mouth 3 (three) times daily. 90 tablet 3   labetalol (NORMODYNE) 300 MG tablet Take 1 tablet (300 mg total) by mouth 2 (two) times daily. 180 tablet 3   Lancets (ONETOUCH DELICA PLUS [id number]) MISC USE UP TO 4 TIMES DAILY AS DIRECTED  1   LINZESS 145 MCG CAPS capsule TAKE 1 CAPSULE BY MOUTH ONCE DAILY BEFORE BREAKFAST  90 capsule 1   Multiple Vitamins-Minerals (HAIR SKIN AND NAILS FORMULA) TABS Take by mouth.     omega-3 acid ethyl esters (LOVAZA) 1 g capsule Take 2 capsules (2 g total) by mouth 2 (two) times daily. 60 capsule 5   omeprazole (PRILOSEC) 40 MG capsule TAKE 1 CAPSULE BY MOUTH TWICE DAILY AT  8  AM  AND  10  PM 180 capsule 0   ondansetron (ZOFRAN) 4 MG tablet Take 1 tablet (4 mg total) by mouth every 8 (eight) hours as needed for nausea or vomiting. 30 tablet 4   pyridoxine (B-6) 100 MG tablet Take 100 mg by mouth daily.     Rimegepant Sulfate (NURTEC) 75 MG TBDP Take 1 tablet by mouth daily as needed. 8 tablet 0   sacubitril-valsartan (ENTRESTO) 97-103 MG Take 1 tablet by mouth 2 (two) times daily. 60 tablet 11   Semaglutide,0.25 or 0.5MG/DOS,  (OZEMPIC, 0.25 OR 0.5 MG/DOSE,) 2 MG/1.5ML SOPN Inject 0.25 mg into the skin once a week. 1.5 mL 0   simvastatin (ZOCOR) 40 MG tablet TAKE 1 TABLET BY MOUTH AT BEDTIME 90 tablet 0   spironolactone (ALDACTONE) 25 MG tablet Take 1 tablet (25 mg total) by mouth daily. 90 tablet 3   tizanidine (ZANAFLEX) 2 MG capsule Take 1 capsule (2 mg total) by mouth 3 (three) times daily. 30 capsule 3   zinc gluconate 50 MG tablet Take 100 mg by mouth daily.     No facility-administered medications prior to visit.    ROS Review of Systems  Constitutional:  Positive for fatigue. Negative for diaphoresis and unexpected weight change.  HENT: Negative.    Eyes: Negative.   Respiratory:  Negative for chest tightness, shortness of breath and wheezing.   Cardiovascular:  Negative for chest pain, palpitations and leg swelling.  Gastrointestinal:  Negative for abdominal pain, constipation, diarrhea, nausea and vomiting.  Endocrine: Negative.   Genitourinary: Negative.  Negative for difficulty urinating and dysuria.  Musculoskeletal:  Positive for back pain. Negative for arthralgias and myalgias.  Skin: Negative.   Neurological:  Negative for dizziness, weakness, light-headedness and headaches.  Hematological:  Negative for adenopathy. Does not bruise/bleed easily.  Psychiatric/Behavioral: Negative.     Objective:  BP 132/78 (BP Location: Right Arm, Patient Position: Sitting, Cuff Size: Large)   Pulse 88   Temp 98.1 F (36.7 C) (Oral)   Ht '5\' 4"'  (1.626 m)   Wt 180 lb (81.6 kg)   SpO2 95%   BMI 30.90 kg/m   BP Readings from Last 3 Encounters:  01/11/21 132/78  01/01/21 124/86  12/15/20 (!) 148/94    Wt Readings from Last 3 Encounters:  01/11/21 180 lb (81.6 kg)  01/01/21 177 lb (80.3 kg)  12/15/20 184 lb (83.5 kg)    Physical Exam Vitals reviewed.  HENT:     Nose: Nose normal.     Mouth/Throat:     Mouth: Mucous membranes are moist.  Eyes:     General: No scleral icterus.     Conjunctiva/sclera: Conjunctivae normal.  Cardiovascular:     Rate and Rhythm: Normal rate and regular rhythm.     Heart sounds: No murmur heard. Pulmonary:     Effort: Pulmonary effort is normal.     Breath sounds: No stridor. No wheezing, rhonchi or rales.  Abdominal:     General: Abdomen is protuberant. Bowel sounds are normal. There is no distension.     Palpations: Abdomen is soft. There is no hepatomegaly, splenomegaly or mass.  Tenderness: There is no abdominal tenderness. There is no guarding.  Musculoskeletal:        General: Normal range of motion.     Cervical back: Neck supple.     Right lower leg: No edema.     Left lower leg: No edema.  Lymphadenopathy:     Cervical: No cervical adenopathy.  Skin:    General: Skin is warm and dry.  Neurological:     General: No focal deficit present.     Mental Status: She is alert.  Psychiatric:        Mood and Affect: Mood normal.        Behavior: Behavior normal.    Lab Results  Component Value Date   WBC 7.8 11/16/2020   HGB 10.5 (L) 11/16/2020   HCT 31.5 (L) 11/16/2020   PLT 300 11/16/2020   GLUCOSE 262 (H) 01/01/2021   CHOL 143 01/11/2021   TRIG 171.0 (H) 01/11/2021   HDL 31.40 (L) 01/11/2021   LDLDIRECT 72.0 09/09/2019   LDLCALC 77 01/11/2021   ALT 22 11/09/2019   AST 16 11/09/2019   NA 137 01/01/2021   K 4.4 01/01/2021   CL 102 01/01/2021   CREATININE 1.04 (H) 01/01/2021   BUN 16 01/01/2021   CO2 23 01/01/2021   TSH 2.21 06/08/2020   INR 1.04 01/14/2013   HGBA1C 6.5 01/11/2021   MICROALBUR 56.1 (H) 01/11/2021    MM 3D SCREEN BREAST BILATERAL  Result Date: 12/18/2020 CLINICAL DATA:  Screening. EXAM: DIGITAL SCREENING BILATERAL MAMMOGRAM WITH TOMOSYNTHESIS AND CAD TECHNIQUE: Bilateral screening digital craniocaudal and mediolateral oblique mammograms were obtained. Bilateral screening digital breast tomosynthesis was performed. The images were evaluated with computer-aided detection. COMPARISON:  Previous  exam(s). ACR Breast Density Category b: There are scattered areas of fibroglandular density. FINDINGS: There are no findings suspicious for malignancy. IMPRESSION: No mammographic evidence of malignancy. A result letter of this screening mammogram will be mailed directly to the patient. RECOMMENDATION: Screening mammogram in one year. (Code:SM-B-01Y) BI-RADS CATEGORY  1: Negative. Electronically Signed   By: Lovey Newcomer M.D.   On: 12/18/2020 16:09    Assessment & Plan:   Carolyn [name] was seen today for hypertension, diabetes, anemia and hyperlipidemia.  Diagnoses and all orders for this visit:  Essential hypertension- Her blood pressure is adequately well controlled. -     Microalbumin / creatinine urine ratio; Future -     Urinalysis, Routine w reflex microscopic; Future -     Urinalysis, Routine w reflex microscopic -     Microalbumin / creatinine urine ratio  Type 2 diabetes mellitus with complication, without long-term current use of insulin ([place])- Her blood sugar is adequately well controlled. -     Hemoglobin A1c; Future -     Microalbumin / creatinine urine ratio; Future -     Urinalysis, Routine w reflex microscopic; Future -     Urinalysis, Routine w reflex microscopic -     Microalbumin / creatinine urine ratio -     Hemoglobin A1c  Hypertriglyceridemia- improvement noted. -     Cancel: Triglycerides; Future  Anemia due to acquired thiamine deficiency- I will monitor her B1 level. -     Vitamin B1; Future -     Vitamin B1  Routine general medical examination at a health care facility- Exam completed, labs reviewed, vaccines reviewed and updated, cancer screenings are up-to-date, patient education was given. -     Lipid panel; Future -     Lipid panel  Other orders -     Flu Vaccine QUAD 6+ mos PF IM (Fluarix Quad PF)  I am having Carolyn [name] maintain her glucose blood, OneTouch Verio IQ System, blood glucose meter kit and supplies, EPINEPHrine, OneTouch Delica Plus  [id number], Fexofenadine HCl (ALLEGRA ALLERGY PO), Diclofenac Sodium, Nurtec, fluticasone, ondansetron, gabapentin, tizanidine, clonazePAM, omeprazole, simvastatin, spironolactone, labetalol, cholecalciferol, pyridoxine, Flax Seed Oil, Hair Skin and Nails Formula, calcium carbonate, vitamin C, zinc gluconate, iron polysaccharides, Entresto, Ozempic (0.25 or 0.5 MG/DOSE), BD Pen Needle Micro U/F, hydrALAZINE, isosorbide dinitrate, cloNIDine, fenofibrate, omega-3 acid ethyl esters, and Linzess.  No orders of the defined types were placed in this encounter.    Follow-up: Return in about 6 months (around 07/11/2021).  Scarlette Calico, MD

## 2021-01-11 NOTE — Patient Instructions (Signed)
Health Maintenance, Female Adopting a healthy lifestyle and getting preventive care are important in promoting health and wellness. Ask your health care provider about: The right schedule for you to have regular tests and exams. Things you can do on your own to prevent diseases and keep yourself healthy. What should I know about diet, weight, and exercise? Eat a healthy diet  Eat a diet that includes plenty of vegetables, fruits, low-fat dairy products, and lean protein. Do not eat a lot of foods that are high in solid fats, added sugars, or sodium. Maintain a healthy weight Body mass index (BMI) is used to identify weight problems. It estimates body fat based on height and weight. Your health care provider can help determine your BMI and help you achieve or maintain a healthy weight. Get regular exercise Get regular exercise. This is one of the most important things you can do for your health. Most adults should: Exercise for at least 150 minutes each week. The exercise should increase your heart rate and make you sweat (moderate-intensity exercise). Do strengthening exercises at least twice a week. This is in addition to the moderate-intensity exercise. Spend less time sitting. Even light physical activity can be beneficial. Watch cholesterol and blood lipids Have your blood tested for lipids and cholesterol at 59 years of age, then have this test every 5 years. Have your cholesterol levels checked more often if: Your lipid or cholesterol levels are high. You are older than 59 years of age. You are at high risk for heart disease. What should I know about cancer screening? Depending on your health history and family history, you may need to have cancer screening at various ages. This may include screening for: Breast cancer. Cervical cancer. Colorectal cancer. Skin cancer. Lung cancer. What should I know about heart disease, diabetes, and high blood pressure? Blood pressure and heart  disease High blood pressure causes heart disease and increases the risk of stroke. This is more likely to develop in people who have high blood pressure readings, are of African descent, or are overweight. Have your blood pressure checked: Every 3-5 years if you are 18-39 years of age. Every year if you are 40 years old or older. Diabetes Have regular diabetes screenings. This checks your fasting blood sugar level. Have the screening done: Once every three years after age 40 if you are at a normal weight and have a low risk for diabetes. More often and at a younger age if you are overweight or have a high risk for diabetes. What should I know about preventing infection? Hepatitis B If you have a higher risk for hepatitis B, you should be screened for this virus. Talk with your health care provider to find out if you are at risk for hepatitis B infection. Hepatitis C Testing is recommended for: Everyone born from 1945 through 1965. Anyone with known risk factors for hepatitis C. Sexually transmitted infections (STIs) Get screened for STIs, including gonorrhea and chlamydia, if: You are sexually active and are younger than 59 years of age. You are older than 59 years of age and your health care provider tells you that you are at risk for this type of infection. Your sexual activity has changed since you were last screened, and you are at increased risk for chlamydia or gonorrhea. Ask your health care provider if you are at risk. Ask your health care provider about whether you are at high risk for HIV. Your health care provider may recommend a prescription medicine   to help prevent HIV infection. If you choose to take medicine to prevent HIV, you should first get tested for HIV. You should then be tested every 3 months for as long as you are taking the medicine. Pregnancy If you are about to stop having your period (premenopausal) and you may become pregnant, seek counseling before you get  pregnant. Take 400 to 800 micrograms (mcg) of folic acid every day if you become pregnant. Ask for birth control (contraception) if you want to prevent pregnancy. Osteoporosis and menopause Osteoporosis is a disease in which the bones lose minerals and strength with aging. This can result in bone fractures. If you are 65 years old or older, or if you are at risk for osteoporosis and fractures, ask your health care provider if you should: Be screened for bone loss. Take a calcium or vitamin D supplement to lower your risk of fractures. Be given hormone replacement therapy (HRT) to treat symptoms of menopause. Follow these instructions at home: Lifestyle Do not use any products that contain nicotine or tobacco, such as cigarettes, e-cigarettes, and chewing tobacco. If you need help quitting, ask your health care provider. Do not use street drugs. Do not share needles. Ask your health care provider for help if you need support or information about quitting drugs. Alcohol use Do not drink alcohol if: Your health care provider tells you not to drink. You are pregnant, may be pregnant, or are planning to become pregnant. If you drink alcohol: Limit how much you use to 0-1 drink a day. Limit intake if you are breastfeeding. Be aware of how much alcohol is in your drink. In the [place] one drink equals one 12 oz bottle of beer (355 mL), one 5 oz glass of wine (148 mL), or one 1 oz glass of hard liquor (44 mL). General instructions Schedule regular health, dental, and eye exams. Stay current with your vaccines. Tell your health care provider if: You often feel depressed. You have ever been abused or do not feel safe at home. Summary Adopting a healthy lifestyle and getting preventive care are important in promoting health and wellness. Follow your health care provider's instructions about healthy diet, exercising, and getting tested or screened for diseases. Follow your health care provider's  instructions on monitoring your cholesterol and blood pressure. This information is not intended to replace advice given to you by your health care provider. Make sure you discuss any questions you have with your health care provider. Document Revised: 06/09/2020 Document Reviewed: 03/25/2018 Elsevier Patient Education  2022 [employer]  

## 2021-01-14 LAB — VITAMIN B1: Vitamin B1 (Thiamine): 11 nmol/L (ref 8–30)

## 2021-01-15 ENCOUNTER — Ambulatory Visit (INDEPENDENT_AMBULATORY_CARE_PROVIDER_SITE_OTHER): Payer: No Typology Code available for payment source | Admitting: Pharmacist

## 2021-01-15 ENCOUNTER — Other Ambulatory Visit: Payer: Self-pay

## 2021-01-15 VITALS — BP 136/90 | HR 83 | Wt 179.0 lb

## 2021-01-15 DIAGNOSIS — E781 Pure hyperglyceridemia: Secondary | ICD-10-CM | POA: Diagnosis not present

## 2021-01-15 DIAGNOSIS — E785 Hyperlipidemia, unspecified: Secondary | ICD-10-CM | POA: Diagnosis not present

## 2021-01-15 DIAGNOSIS — I10 Essential (primary) hypertension: Secondary | ICD-10-CM | POA: Diagnosis not present

## 2021-01-15 DIAGNOSIS — E118 Type 2 diabetes mellitus with unspecified complications: Secondary | ICD-10-CM | POA: Diagnosis not present

## 2021-01-15 DIAGNOSIS — R931 Abnormal findings on diagnostic imaging of heart and coronary circulation: Secondary | ICD-10-CM

## 2021-01-15 MED ORDER — OZEMPIC (1 MG/DOSE) 4 MG/3ML ~~LOC~~ SOPN: 1.0000 mg | PEN_INJECTOR | SUBCUTANEOUS | 3 refills | Status: DC

## 2021-01-15 MED ORDER — HYDRALAZINE HCL 50 MG PO TABS: 50.0000 mg | ORAL_TABLET | Freq: Three times a day (TID) | ORAL | 3 refills | Status: DC
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
## 2021-01-15 NOTE — Patient Instructions (Addendum)
Decrease clonidine to 0.05 (1/2 tablet) twice a day Increase hydralazine to 50mg  three times a day Continue labetalol 300mg  twice a day, spironolactone 25mg  daily, isosorbide 20mg  three times a day  Please bring in a list of your blood pressures  Call me at [phone] with any questions

## 2021-01-15 NOTE — Telephone Encounter (Signed)
Corrected paper work and sent back to medical records.

## 2021-01-16 ENCOUNTER — Telehealth: Payer: Self-pay

## 2021-01-16 NOTE — Telephone Encounter (Signed)
Called to share next PREP class schedule for Carolyn [name]; she wants to attend M/W class starting 10/31 8-9:15a. Will call back mid month to set up assessment visit for week of 10/24.
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
## 2021-01-19 ENCOUNTER — Ambulatory Visit: Payer: No Typology Code available for payment source | Admitting: Family Medicine
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
## 2021-01-24 ENCOUNTER — Ambulatory Visit (INDEPENDENT_AMBULATORY_CARE_PROVIDER_SITE_OTHER): Payer: No Typology Code available for payment source | Admitting: Family Medicine

## 2021-01-24 ENCOUNTER — Other Ambulatory Visit: Payer: Self-pay

## 2021-01-24 ENCOUNTER — Encounter: Payer: Self-pay | Admitting: Family Medicine

## 2021-01-24 ENCOUNTER — Ambulatory Visit: Payer: Self-pay

## 2021-01-24 VITALS — BP 118/88 | HR 103 | Ht 64.0 in | Wt 178.0 lb

## 2021-01-24 DIAGNOSIS — M7552 Bursitis of left shoulder: Secondary | ICD-10-CM | POA: Diagnosis not present

## 2021-01-24 DIAGNOSIS — G8929 Other chronic pain: Secondary | ICD-10-CM

## 2021-01-24 DIAGNOSIS — M25512 Pain in left shoulder: Secondary | ICD-10-CM
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
## 2021-01-24 NOTE — Patient Instructions (Signed)
Be proud of weight loss Monitor shoulder Keep hands in peripheral vision  See me in 6-8 weeks and can consider injection

## 2021-01-24 NOTE — Assessment & Plan Note (Signed)
Patient seems to be making improvement with conservative therapy at this time.  Patient states that she is feeling approximately 40% better.  We did discuss the possibility of injection which patient declined.  Patient is to increase activity slowly over the course the next several weeks.  Discussed icing regimen.  Patient will keep increasing activity but if any worsening I do feel that we should consider the possibility of advanced imaging as well.  Patient will follow up with me again in 6 to 8 weeks to make sure she continues to make some improvement.

## 2021-01-25 ENCOUNTER — Ambulatory Visit (INDEPENDENT_AMBULATORY_CARE_PROVIDER_SITE_OTHER): Payer: No Typology Code available for payment source | Admitting: Pharmacist

## 2021-01-25 VITALS — BP 132/88 | Wt 178.0 lb

## 2021-01-25 DIAGNOSIS — R931 Abnormal findings on diagnostic imaging of heart and coronary circulation: Secondary | ICD-10-CM | POA: Diagnosis not present

## 2021-01-25 DIAGNOSIS — I10 Essential (primary) hypertension: Secondary | ICD-10-CM

## 2021-01-25 MED ORDER — CARVEDILOL 12.5 MG PO TABS: 12.5000 mg | ORAL_TABLET | Freq: Two times a day (BID) | ORAL | 3 refills | Status: DC

## 2021-01-25 NOTE — Patient Instructions (Addendum)
Start taking Ozempic 1mg  on 10/22   Start taking clonidine 0.05mg  (1/2 tablet) once a day at night for 5 days, then stop  STOP taking labetalol and START taking carvedilol 6.25mg  (1/2 of 12mg  tablet) twice a day.   Continue checking blood pressure once a day

## 2021-01-30 ENCOUNTER — Other Ambulatory Visit: Payer: Self-pay | Admitting: Internal Medicine

## 2021-01-30 DIAGNOSIS — K2100 Gastro-esophageal reflux disease with esophagitis, without bleeding: Secondary | ICD-10-CM

## 2021-02-05 ENCOUNTER — Telehealth: Payer: Self-pay

## 2021-02-05 NOTE — Telephone Encounter (Signed)
Called to set up PREP assessment visit; will come Thursday 10/27 after her 9am doctor appt.

## 2021-02-07 NOTE — Progress Notes (Signed)
Patient ID: CASIE STURGEON                 DOB: [birth year]                      MRN: [id number]     HPI: MAHLET JERGENS is a 60 y.o. female referred by Dr. Johnsie Cancel to pharmacy clinic for HF medication management. PMH is significant for HTN, OSA, anemia, DM and recent diagnosis of DCM. Most recent LVEF 35 to 40% on 11/23/20.   At last PharmD visit on 10/13, BPs at home were mostly within goal with most readings 110-120s/70s with a few in the 130's/80-90's. She reported feeling a bit tired at times and endorses a bit of dizziness that only lasts a few seconds. She also reported being less restrictive with sodium and continuing healthy eating habits. She mentioned recently having frequent nightmares but does not believe this is related to the medication. Clonidine was decreased to 0.05 mg qhs for 5 days and then discontinued. Labetalol was also discontinued and patient was started on carvedilol 6.25 mg BID. She has a prescription for 12.5 mg and was advised to cut in half. She has been on SGLT2i in the past and had yeast infection. Is prone to yeast infections.   Today, patient presents to HTN clinic. She checks her BP in the middle of the day and recently they range from 110-130s/80s with a few 140/90s. She has not taken her BP meds this morning. Weight has been stable at 175-179 lbs. She reports improvement in nightmares since lase visit and denies no changes in headaches and dizziness from baseline. She notes foot swelling that started a week ago that had resolved over two days after foot elevation. She has stopped restricting salt and targets 2300 mg of sodium/day. Since discontinuing clonidine and starting carvedilol she reports feeling more energized than normal. Her Bps at home range from She reports she has not started Ozempic 1 mg since she had an additional dose of overflow in her 0.5 mg pen which she took last Saturday. Pt is going out of town for Thanksgiving and her son is cooking her healthy  alternatives.  Current CHF meds: hydralazine 50 mg TID, isosorbide dinitrate 20 mg TID, spironolactone 25 mg daily, Entresto 97/103 mg BID, carvedilol 6.25 mg BID Other BP Meds: Previously tried: Invokana (yeast infection) BP goal: <130/80   Family History: The patient's family history includes Alzheimer's disease in her mother; Breast cancer in her sister; Cancer in her sister; Diabetes in her brother, sister, and sister; Emphysema in her father and sister; Hypertension in her mother; Thyroid cancer in her sister. There is no history of Colon cancer.  Home BP 136/90 HR 83, 143/89 HR 98, 138/88 HR 102, 134/86 HR 92, 115/77 HR 91, 119/75 HR 91, 118/88 HR 103  Weights range from 175-179 at home  Wt Readings from Last 3 Encounters:  02/08/21 177 lb 9.6 oz (80.6 kg)  01/25/21 178 lb (80.7 kg)  01/24/21 178 lb (80.7 kg)   BP Readings from Last 3 Encounters:  01/25/21 132/88  01/24/21 118/88  01/15/21 136/90   Pulse Readings from Last 3 Encounters:  02/08/21 94  01/24/21 (!) 103  01/15/21 83    Renal function: CrCl cannot be calculated (Patient's most recent lab result is older than the maximum 21 days allowed.).  Past Medical History:  Diagnosis Date   Allergy    seasonal   Anemia    Anemia  of chronic disease suspected   Anxiety attack    Arthritis    Benign fundic gland polyps of stomach    Dyslipidemia    Exertional shortness of breath    GERD (gastroesophageal reflux disease)    Heart murmur    Hyperlipidemia    Hypertension    Migraines    "probably weekly" (01/14/2013)   Type II diabetes mellitus ([place])     Current Outpatient Medications on File Prior to Visit  Medication Sig Dispense Refill   Ascorbic Acid (VITAMIN C) 1000 MG tablet Take 1,500 mg by mouth daily.     blood glucose meter kit and supplies KIT Dispense based on patient and insurance preference. Use up to four times daily as directed. (FOR ICD-9 250.00, 250.01). 1 each 0   Blood Glucose Monitoring  Suppl (ONETOUCH VERIO IQ SYSTEM) W/DEVICE KIT 1 Act by Does not apply route 3 (three) times daily. 2 kit 0   calcium carbonate (OS-CAL - DOSED IN MG OF ELEMENTAL CALCIUM) 1250 (500 Ca) MG tablet Take 2 tablets by mouth daily with breakfast.     carvedilol (COREG) 12.5 MG tablet Take 1 tablet (12.5 mg total) by mouth 2 (two) times daily. 180 tablet 3   cholecalciferol (VITAMIN D3) 25 MCG (1000 UNIT) tablet Take 2,000 Units by mouth daily.     clonazePAM (KLONOPIN) 1 MG tablet Take 1 tablet (1 mg total) by mouth at bedtime. 90 tablet 1   Diclofenac Sodium 2 % SOLN Place 2 g onto the skin 2 (two) times daily. 112 g 3   EPINEPHrine 0.3 mg/0.3 mL IJ SOAJ injection INJECT 0.3 MLS INTO THE MUSCLE ONCE FOR 1 DOSE  0   fenofibrate (TRICOR) 145 MG tablet Take 1 tablet (145 mg total) by mouth daily. 30 tablet 5   Fexofenadine HCl (ALLEGRA ALLERGY PO) Take by mouth.     Flaxseed, Linseed, (FLAX SEED OIL) 1000 MG CAPS Take by mouth.     fluticasone (FLONASE) 50 MCG/ACT nasal spray Place 2 sprays into both nostrils daily. 16 g 6   gabapentin (NEURONTIN) 100 MG capsule Take 2 capsules (200 mg total) by mouth at bedtime. 180 capsule 3   glucose blood (ONE TOUCH ULTRA TEST) test strip Use to test blood sugar three times a week and prn if having symptoms of low blood sugar Dx: 250.92 90 day supply 100 each 11   hydrALAZINE (APRESOLINE) 50 MG tablet Take 1 tablet (50 mg total) by mouth 3 (three) times daily. 270 tablet 3   Insulin Pen Needle (BD PEN NEEDLE MICRO U/F) 32G X 6 MM MISC Inject 1 Units into the skin once a week. 100 each 3   iron polysaccharides (NIFEREX) 150 MG capsule Take 150 mg by mouth daily.     isosorbide dinitrate (ISORDIL) 20 MG tablet Take 1 tablet (20 mg total) by mouth 3 (three) times daily. 90 tablet 3   Lancets (ONETOUCH DELICA PLUS [id number]) MISC USE UP TO 4 TIMES DAILY AS DIRECTED  1   LINZESS 145 MCG CAPS capsule TAKE 1 CAPSULE BY MOUTH ONCE DAILY BEFORE BREAKFAST 90 capsule 1    Multiple Vitamins-Minerals (HAIR SKIN AND NAILS FORMULA) TABS Take by mouth.     omega-3 acid ethyl esters (LOVAZA) 1 g capsule Take 2 capsules (2 g total) by mouth 2 (two) times daily. 60 capsule 5   omeprazole (PRILOSEC) 40 MG capsule TAKE 1 CAPSULE BY MOUTH TWICE DAILY AT  8AM  AND  10PM 180 capsule 1  ondansetron (ZOFRAN) 4 MG tablet Take 1 tablet (4 mg total) by mouth every 8 (eight) hours as needed for nausea or vomiting. 30 tablet 4   pyridoxine (B-6) 100 MG tablet Take 100 mg by mouth daily.     Rimegepant Sulfate (NURTEC) 75 MG TBDP Take 1 tablet by mouth daily as needed. 8 tablet 0   sacubitril-valsartan (ENTRESTO) 97-103 MG Take 1 tablet by mouth 2 (two) times daily. 60 tablet 11   Semaglutide, 1 MG/DOSE, (OZEMPIC, 1 MG/DOSE,) 4 MG/3ML SOPN Inject 1 mg into the skin once a week. 3 mL 3   simvastatin (ZOCOR) 40 MG tablet TAKE 1 TABLET BY MOUTH AT BEDTIME 90 tablet 0   spironolactone (ALDACTONE) 25 MG tablet Take 1 tablet (25 mg total) by mouth daily. 90 tablet 3   tizanidine (ZANAFLEX) 2 MG capsule Take 1 capsule (2 mg total) by mouth 3 (three) times daily. 30 capsule 3   zinc gluconate 50 MG tablet Take 100 mg by mouth daily.     No current facility-administered medications on file prior to visit.    Allergies  Allergen Reactions   Invokana [Canagliflozin] Other (See Comments)    YEAST INFECTIONS   Codeine Nausea And Vomiting   Flexeril [Cyclobenzaprine]     sedation   Morphine And Related Itching   Reglan [Metoclopramide] Other (See Comments)    "paralyzes me"   [place]     In watch bands     Assessment/Plan:  1. CHF -  BP is near goal of <130/80 in clinic today. Home BP has been near <130/80 with some at goal. Will increase carvedilol to 12.5 mg BID (advised patient to take full tablet). Continue hydralazine 50 mg TID, isosorbide dinitrate 20 mg TID, spironolactone 25 mg daily, Entresto 97/103 mg BID. Follow up in 2 weeks to assess BP and at that visit consider  increasing carvedilol to target dose of 25 mg BID.   2. DM / weight loss - Patient doing well on Ozempic 0.5 mg. She has picked up Ozempic 12m from the pharmacy and will start first shot on 10/29. Advised pt that nausea may occur with increase to 1 mg but this should lessen with subsequent administrations.  MCyd Silence Pharm D. Candidate  [facility] PFloridaD, BCPS, CPP [facility] [id number] [place] [address] Phone: [phone] Fax: [phone]

## 2021-02-08 ENCOUNTER — Ambulatory Visit (INDEPENDENT_AMBULATORY_CARE_PROVIDER_SITE_OTHER): Payer: No Typology Code available for payment source | Admitting: Pharmacist

## 2021-02-08 ENCOUNTER — Other Ambulatory Visit: Payer: Self-pay

## 2021-02-08 VITALS — BP 128/90 | HR 94 | Wt 177.6 lb

## 2021-02-08 DIAGNOSIS — I10 Essential (primary) hypertension: Secondary | ICD-10-CM | POA: Diagnosis not present

## 2021-02-08 NOTE — Patient Instructions (Addendum)
It was nice to see you today. Your BP in office today is 128/90.    Increase carvedilol to 12.5 mg (one full tablet) twice a day. On Saturday, increase Ozempic to 1mg  weekly.   CONTINUE hydralazine 50 mg three times a day, isosorbide dinitrate 20 mg three times a day, spironolactone 25 mg daily, Entresto 97/103 mg twice a day.   Continue monitoring your blood pressure one to two times a day at home and vary the times that you take it. Bring in your log to your next visit. Aim to consume less than 2300 milligrams of sodium per day.    Follow up in 2 weeks on November 10th to assess blood pressure.

## 2021-02-08 NOTE — Progress Notes (Signed)
[facility] PREP Evaluation  Patient Details  Name: Carolyn [name] MRN: [id number] Date of Birth: 1960/12/30 Age: 60 y.o. PCP: [name], MD  Vitals:   02/08/21 1116  BP: (!) 152/90  Pulse: 71  SpO2: 99%  Weight: 175 lb 9.6 oz (79.7 kg)     [facility] Eval - 02/08/21 1100       [facility] "PREP" Location   [facility] "PREP" Location [facility]      Referral    Referring Provider [place]    Reason for referral Inactivity;Hypertension;Diabetes    Program Start Date 02/12/21      Measurement   Waist Circumference 39.5 inches    Hip Circumference 39.5 inches    Body fat 36.4 percent      Information for Trainer   Goals --   Establish exercise routine, lose 10 pounds by end of 12 weeks, learn to eat better/nutritious meals   Current Exercise --   walking 15 minutes a day   Orthopedic Concerns --   s/p L tibial plateau fracture   Pertinent Medical History --   HTN, Cardiomyopathy, diabetes   Medications that affect exercise Beta blocker      Timed Up and Go (TUGS)   Timed Up and Go Low risk <9 seconds      Mobility and Daily Activities   I find it easy to walk up or down two or more flights of stairs. 4    I have no trouble taking out the trash. 4    I do housework such as vacuuming and dusting on my own without difficulty. 4    I can easily lift a gallon of milk (8lbs). 4    I can easily walk a mile. 4    I have no trouble reaching into high cupboards or reaching down to pick up something from the floor. 4    I do not have trouble doing out-door work such as Armed forces logistics/support/administrative officer, raking leaves, or gardening. 3      Mobility and Daily Activities   I feel younger than my age. 4    I feel independent. 4    I feel energetic. 2    I live an active life.  2    I feel strong. 2    I feel healthy. 2    I feel active as other people my age. 4      How fit and strong are you.   Fit and Strong Total Score 47            Past Medical History:  Diagnosis Date   Allergy     seasonal   Anemia    Anemia of chronic disease suspected   Anxiety attack    Arthritis    Benign fundic gland polyps of stomach    Dyslipidemia    Exertional shortness of breath    GERD (gastroesophageal reflux disease)    Heart murmur    Hyperlipidemia    Hypertension    Migraines    "probably weekly" (01/14/2013)   Type II diabetes mellitus ([place])    Past Surgical History:  Procedure Laterality Date   CHOLECYSTECTOMY  1990   COLONOSCOPY     ENDOMETRIAL ABLATION  ~ 2000-2002   "twice" (01/14/2013)   LEFT HEART CATHETERIZATION WITH CORONARY ANGIOGRAM N/A 01/15/2013   Procedure: LEFT HEART CATHETERIZATION WITH CORONARY ANGIOGRAM;  Surgeon: Blane Ohara, MD;  Location: [facility] CATH LAB;  Service: Cardiovascular;  Laterality: N/A;  LEFT OOPHORECTOMY Left ~ 2003   ORIF TIBIA & FIBULA FRACTURES Left 2003   VAGINAL HYSTERECTOMY  ~ 2003   Social History   Tobacco Use  Smoking Status Never  Smokeless Tobacco Never  To begin PREP Classes at [facility], every M/W 8-9:15am starting 02/12/21   B  02/08/2021, 11:20 AM

## 2021-02-09 ENCOUNTER — Encounter: Payer: Self-pay | Admitting: Hematology and Oncology

## 2021-02-11 ENCOUNTER — Other Ambulatory Visit: Payer: Self-pay | Admitting: Internal Medicine

## 2021-02-11 DIAGNOSIS — E785 Hyperlipidemia, unspecified: Secondary | ICD-10-CM

## 2021-02-12 NOTE — Progress Notes (Signed)
[facility] PREP Weekly Session  Patient Details  Name: Carolyn [name] MRN: [id number] Date of Birth: [birth year] Age: 60 y.o. PCP: [name], MD  There were no vitals filed for this visit.   [facility] Weekly seesion - 02/12/21 1100       [facility] "PREP" Location   [facility] "PREP" Location [facility]      Weekly Session   Topic Discussed Goal setting and welcome to the program   tour of facility   Classes attended to date 1           Able to complete 10-15 minutes light cardio; 5 minutes of stretching   B  02/12/2021, 11:27 AM
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
## 2021-02-19 NOTE — Progress Notes (Signed)
[facility] PREP Weekly Session  Patient Details  Name: Carolyn [name] MRN: [id number] Date of Birth: 31-May-1960 Age: 60 y.o. PCP: [name], MD  Vitals:   02/19/21 0945  Weight: 178 lb 6.4 oz (80.9 kg)     [facility] Weekly seesion - 02/19/21 0900       [facility] "PREP" Location   [facility] "PREP" Location [facility]      Weekly Session   Topic Discussed Importance of resistance training;Other ways to be active   [employer]uka app; cardio 150 min/wk; strength 2-3 times/wk, 20-40 minutes   Minutes exercised this week 340 minutes    Classes attended to date [place] 02/19/2021, 9:46 AM

## 2021-02-20 ENCOUNTER — Other Ambulatory Visit: Payer: Self-pay

## 2021-02-20 ENCOUNTER — Encounter: Payer: Self-pay | Admitting: Cardiovascular Disease

## 2021-02-20 ENCOUNTER — Ambulatory Visit (INDEPENDENT_AMBULATORY_CARE_PROVIDER_SITE_OTHER): Payer: No Typology Code available for payment source | Admitting: Cardiovascular Disease

## 2021-02-20 VITALS — BP 128/70 | HR 74 | Ht 64.0 in | Wt 180.0 lb

## 2021-02-20 DIAGNOSIS — I420 Dilated cardiomyopathy: Secondary | ICD-10-CM | POA: Diagnosis not present

## 2021-02-20 DIAGNOSIS — I10 Essential (primary) hypertension: Secondary | ICD-10-CM | POA: Diagnosis not present

## 2021-02-20 NOTE — Patient Instructions (Signed)
Medication Instructions:  *If you need a refill on your cardiac medications before your next appointment, please call your pharmacy*  Lab Work: If you have labs (blood work) drawn today and your tests are completely normal, you will receive your results only by: [place] (if you have MyChart) OR A paper copy in the mail If you have any lab test that is abnormal or we need to change your treatment, we will call you to review the results.  Testing/Procedures: None ordered today.  Follow-Up: At [facility], you and your health needs are our priority.  As part of our continuing mission to provide you with exceptional heart care, we have created designated Provider Care Teams.  These Care Teams include your primary Cardiologist (physician) and Advanced Practice Providers (APPs -  Physician Assistants and Nurse Practitioners) who all work together to provide you with the care you need, when you need it.  We recommend signing up for the patient portal called "MyChart".  Sign up information is provided on this After Visit Summary.  MyChart is used to connect with patients for Virtual Visits (Telemedicine).  Patients are able to view lab/test results, encounter notes, upcoming appointments, etc.  Non-urgent messages can be sent to your provider as well.   To learn more about what you can do with MyChart, go to [email or web address].    Your next appointment:   6 month(s)  The format for your next appointment:   In Person  Provider:   Jenkins Rouge, MD

## 2021-02-22 ENCOUNTER — Ambulatory Visit (INDEPENDENT_AMBULATORY_CARE_PROVIDER_SITE_OTHER): Payer: No Typology Code available for payment source | Admitting: Pharmacist

## 2021-02-22 ENCOUNTER — Other Ambulatory Visit: Payer: Self-pay

## 2021-02-22 VITALS — BP 124/90 | HR 93 | Wt 178.0 lb

## 2021-02-22 DIAGNOSIS — E118 Type 2 diabetes mellitus with unspecified complications: Secondary | ICD-10-CM

## 2021-02-22 DIAGNOSIS — I10 Essential (primary) hypertension: Secondary | ICD-10-CM | POA: Diagnosis not present

## 2021-02-22 DIAGNOSIS — R931 Abnormal findings on diagnostic imaging of heart and coronary circulation: Secondary | ICD-10-CM

## 2021-02-22 MED ORDER — CARVEDILOL 25 MG PO TABS: 25.0000 mg | ORAL_TABLET | Freq: Two times a day (BID) | ORAL | 3 refills | Status: DC

## 2021-02-22 NOTE — Patient Instructions (Addendum)
Please increase your carvedilol to 25mg  twice a day. You may take 2 of the 12.5mg  tablets twice a day until you run out.  Continue hydralazine 50 mg three times a day, isosorbide dinitrate 20 mg three times a day, spironolactone 25 mg daily, Entresto 97/103 mg twice a day.  Keep up the good work with your exercise!

## 2021-02-22 NOTE — Progress Notes (Signed)
Patient ID: Carolyn [name]                 DOB: 04-06-1961                      MRN: [id number]     HPI: Carolyn [name] is a 60 y.o. female referred by Dr. Johnsie Cancel to pharmacy clinic for HF medication management. PMH is significant for HTN, OSA, anemia, DM and recent diagnosis of DCM. Most recent LVEF 35 to 40% on 11/23/20.   At last PharmD visit on 10/13, BPs at home were mostly within goal with most readings 110-120s/70s with a few in the 130's/80-90's. She reported feeling a bit tired at times and endorses a bit of dizziness that only lasts a few seconds. She also reported being less restrictive with sodium and continuing healthy eating habits. She mentioned recently having frequent nightmares but does not believe this is related to the medication. Clonidine was decreased to 0.05 mg qhs for 5 days and then discontinued. Labetalol was also discontinued and patient was started on carvedilol 6.25 mg BID. At last visit carvedilol was increased to 12.79m BID. She has been on SGLT2i in the past and had yeast infection. Is prone to yeast infections.   Patient presents today for follow up. Denies dizziness, lightheadedness. Has mild swelling in foot occasionally that resolves with elevation of feet. Her home BP has been 120/70's per pt report. Hasn't taken meds this AM yet. She started Ozempic 125mthis week. Doing fine with it. Enjoying the [employer] prep class. Going to class 2 days a week and then to the gym extra days in the AM.  Current CHF meds: hydralazine 50 mg TID, isosorbide dinitrate 20 mg TID, spironolactone 25 mg daily, Entresto 97/103 mg BID, carvedilol 12.5 mg BID Other BP Meds: Previously tried: Invokana (yeast infection) BP goal: <130/80   Family History: The patient's family history includes Alzheimer's disease in her mother; Breast cancer in her sister; Cancer in her sister; Diabetes in her brother, sister, and sister; Emphysema in her father and sister; Hypertension in her mother; Thyroid  cancer in her sister. There is no history of Colon cancer.  Home BP: 120's/70's occasionally higher but comes down after she rests HR 90's  Exercise: [employer] prep class twice a week, going to gym extra   Wt Readings from Last 3 Encounters:  02/20/21 180 lb (81.6 kg)  02/19/21 178 lb 6.4 oz (80.9 kg)  02/08/21 175 lb 9.6 oz (79.7 kg)   BP Readings from Last 3 Encounters:  02/20/21 128/70  02/08/21 (!) 152/90  02/08/21 128/90   Pulse Readings from Last 3 Encounters:  02/20/21 74  02/08/21 71  02/08/21 94    Renal function: CrCl cannot be calculated (Patient's most recent lab result is older than the maximum 21 days allowed.).  Past Medical History:  Diagnosis Date   Allergy    seasonal   Anemia    Anemia of chronic disease suspected   Anxiety attack    Arthritis    Benign fundic gland polyps of stomach    Dyslipidemia    Exertional shortness of breath    GERD (gastroesophageal reflux disease)    Heart murmur    Hyperlipidemia    Hypertension    Migraines    "probably weekly" (01/14/2013)   [place] II diabetes mellitus [place]    Current Outpatient Medications on File Prior to Visit  Medication Sig Dispense Refill   Ascorbic Acid (  VITAMIN C) 1000 MG tablet Take 1,500 mg by mouth daily.     blood glucose meter kit and supplies KIT Dispense based on patient and insurance preference. Use up to four times daily as directed. (FOR ICD-9 250.00, 250.01). 1 each 0   Blood Glucose Monitoring Suppl (ONETOUCH VERIO IQ SYSTEM) W/DEVICE KIT 1 Act by Does not apply route 3 (three) times daily. 2 kit 0   calcium carbonate (OS-CAL - DOSED IN MG OF ELEMENTAL CALCIUM) 1250 (500 Ca) MG tablet Take 2 tablets by mouth daily with breakfast.     carvedilol (COREG) 12.5 MG tablet Take 1 tablet (12.5 mg total) by mouth 2 (two) times daily. 180 tablet 3   cholecalciferol (VITAMIN D3) 25 MCG (1000 UNIT) tablet Take 2,000 Units by mouth daily.     clonazePAM (KLONOPIN) 1 MG tablet Take 1 tablet (1 mg  total) by mouth at bedtime. 90 tablet 1   Diclofenac Sodium 2 % SOLN Place 2 g onto the skin 2 (two) times daily. 112 g 3   EPINEPHrine 0.3 mg/0.3 mL IJ SOAJ injection INJECT 0.3 MLS INTO THE MUSCLE ONCE FOR 1 DOSE  0   fenofibrate (TRICOR) 145 MG tablet Take 1 tablet (145 mg total) by mouth daily. 30 tablet 5   Fexofenadine HCl (ALLEGRA ALLERGY PO) Take by mouth.     Flaxseed, Linseed, (FLAX SEED OIL) 1000 MG CAPS Take by mouth.     fluticasone (FLONASE) 50 MCG/ACT nasal spray Place 2 sprays into both nostrils daily. 16 g 6   gabapentin (NEURONTIN) 100 MG capsule Take 2 capsules (200 mg total) by mouth at bedtime. 180 capsule 3   glucose blood (ONE TOUCH ULTRA TEST) test strip Use to test blood sugar three times a week and prn if having symptoms of low blood sugar Dx: 250.92 90 day supply 100 each 11   hydrALAZINE (APRESOLINE) 50 MG tablet Take 1 tablet (50 mg total) by mouth 3 (three) times daily. 270 tablet 3   Insulin Pen Needle (BD PEN NEEDLE MICRO U/F) 32G X 6 MM MISC Inject 1 Units into the skin once a week. 100 each 3   iron polysaccharides (NIFEREX) 150 MG capsule Take 150 mg by mouth daily.     isosorbide dinitrate (ISORDIL) 20 MG tablet Take 1 tablet (20 mg total) by mouth 3 (three) times daily. 90 tablet 3   Lancets (ONETOUCH DELICA PLUS [id number]) MISC USE UP TO 4 TIMES DAILY AS DIRECTED  1   LINZESS 145 MCG CAPS capsule TAKE 1 CAPSULE BY MOUTH ONCE DAILY BEFORE BREAKFAST 90 capsule 1   Multiple Vitamins-Minerals (HAIR SKIN AND NAILS FORMULA) TABS Take by mouth.     omega-3 acid ethyl esters (LOVAZA) 1 g capsule Take 2 capsules (2 g total) by mouth 2 (two) times daily. 60 capsule 5   omeprazole (PRILOSEC) 40 MG capsule TAKE 1 CAPSULE BY MOUTH TWICE DAILY AT  8AM  AND  10PM 180 capsule 1   ondansetron (ZOFRAN) 4 MG tablet Take 1 tablet (4 mg total) by mouth every 8 (eight) hours as needed for nausea or vomiting. 30 tablet 4   pyridoxine (B-6) 100 MG tablet Take 100 mg by mouth daily.      Rimegepant Sulfate (NURTEC) 75 MG TBDP Take 1 tablet by mouth daily as needed. 8 tablet 0   sacubitril-valsartan (ENTRESTO) 97-103 MG Take 1 tablet by mouth 2 (two) times daily. 60 tablet 11   Semaglutide, 1 MG/DOSE, (OZEMPIC, 1 MG/DOSE,) 4 MG/3ML SOPN  Inject 1 mg into the skin once a week. 3 mL 3   simvastatin (ZOCOR) 40 MG tablet TAKE 1 TABLET BY MOUTH AT BEDTIME 90 tablet 1   spironolactone (ALDACTONE) 25 MG tablet Take 1 tablet (25 mg total) by mouth daily. 90 tablet 3   tizanidine (ZANAFLEX) 2 MG capsule Take 1 capsule (2 mg total) by mouth 3 (three) times daily. 30 capsule 3   zinc gluconate 50 MG tablet Take 100 mg by mouth daily.     No current facility-administered medications on file prior to visit.    Allergies  Allergen Reactions   Invokana [Canagliflozin] Other (See Comments)    YEAST INFECTIONS   Codeine Nausea And Vomiting   Flexeril [Cyclobenzaprine]     sedation   Morphine And Related Itching   Reglan [Metoclopramide] Other (See Comments)    "paralyzes me"   Silicon     In watch bands     Assessment/Plan:  1. CHF -  BP is near goal of <130/80 in clinic today.  Will increase carvedilol to 25 mg BID. This will complete target dose GDMT. Continue hydralazine 50 mg TID, isosorbide dinitrate 20 mg TID, spironolactone 25 mg daily, Entresto 97/103 mg BID. Follow up in 3 weeks.   2. DM / weight loss - Patient doing well on Ozempic 1 mg. Continue for 3 more injections. At that point can consider increasing to 35m or staying at 1 mg. [facility] and really [facility].  MRamond Dial Pharm.D, BCPS, CPP [place] [id number] [place] [address] Phone: [phone] Fax: [phone]

## 2021-02-26 NOTE — Progress Notes (Signed)
[employer] PREP Weekly Session  Patient Details  Name: Carolyn [name] MRN: [id number] Date of Birth: 03-26-1961 Age: 60 y.o. PCP: [name], MD  Vitals:   02/26/21 0937  Weight: 177 lb 6.4 oz (80.5 kg)     [employer] Weekly seesion - 02/26/21 0900       [employer] "PREP" Location   [facility] "PREP" Location [facility]      Weekly Session   Topic Discussed Healthy eating tips    Minutes exercised this week 130 minutes    Classes attended to date [place] 02/26/2021, 9:38 AM

## 2021-03-05 NOTE — Progress Notes (Signed)
[employer] PREP Weekly Session  Patient Details  Name: Carolyn [name] MRN: [id number] Date of Birth: [birth year] Age: 60 y.o. PCP: [name], MD  Vitals:   03/05/21 1116  Weight: 177 lb 12.8 oz (80.6 kg)     [employer] Weekly seesion - 03/05/21 1100       [facility] "PREP" Location   [facility] "PREP" Location [place]      Weekly Session   Topic Discussed Health habits    Minutes exercised this week 300 minutes    Classes attended to date 7           Sugar Demo, honey label review  [place] 03/05/2021, 11:17 AM

## 2021-03-06 ENCOUNTER — Telehealth: Payer: Self-pay

## 2021-03-06 NOTE — Telephone Encounter (Signed)
PA started.  Key: [id number]

## 2021-03-12 NOTE — Progress Notes (Signed)
[employer] PREP Weekly Session  Patient Details  Name: Carolyn [name] MRN: [id number] Date of Birth: 09/04/1960 Age: 60 y.o. PCP: [name], MD  Vitals:   03/12/21 0928  Weight: 174 lb 6.4 oz (79.1 kg)     [facility] Weekly seesion - 03/12/21 0900       [facility] "PREP" Location   [facility] "PREP" Location [facility]      Weekly Session   Topic Discussed Restaurant Eating   salt demo   Minutes exercised this week 120 minutes    Classes attended to date [place] 03/12/2021, 9:29 AM

## 2021-03-14 ENCOUNTER — Other Ambulatory Visit: Payer: No Typology Code available for payment source
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
## 2021-03-15 ENCOUNTER — Ambulatory Visit (INDEPENDENT_AMBULATORY_CARE_PROVIDER_SITE_OTHER): Payer: No Typology Code available for payment source

## 2021-03-15 ENCOUNTER — Ambulatory Visit (INDEPENDENT_AMBULATORY_CARE_PROVIDER_SITE_OTHER): Payer: No Typology Code available for payment source | Admitting: Family Medicine

## 2021-03-15 ENCOUNTER — Other Ambulatory Visit: Payer: Self-pay

## 2021-03-15 VITALS — BP 126/82 | HR 85 | Ht 64.0 in | Wt 178.0 lb

## 2021-03-15 DIAGNOSIS — M1711 Unilateral primary osteoarthritis, right knee: Secondary | ICD-10-CM

## 2021-03-15 DIAGNOSIS — M7552 Bursitis of left shoulder: Secondary | ICD-10-CM

## 2021-03-15 NOTE — Assessment & Plan Note (Signed)
Patient has had the patellofemoral arthritis previously.  We will get x-rays to further evaluate.  Trace effusion felt today.  He likely did have subluxation and given a Tru lite pull brace.  Hopefully will be beneficial.  Follow-up with me again in 2 months.  If continuing give her trouble may need to consider the possibility of viscosupplementation

## 2021-03-15 NOTE — Assessment & Plan Note (Signed)
Patient has made improvement at this time.  Discussed icing regimen and home exercises discussed with patient if any worsening symptoms we can consider the possibility of injection.  Patient has declined and would like to continue to declined at the moment.  We also discussed the potential for imaging which patient also declines.  Feels that the physical therapy that she is doing has been helpful.  Follow-up with me again in 2 months

## 2021-03-15 NOTE — Patient Instructions (Signed)
Xray today Do prescribed exercises at least 3x a week No squats No lunges See you again in 2 months

## 2021-03-16 ENCOUNTER — Other Ambulatory Visit: Payer: No Typology Code available for payment source | Admitting: *Deleted

## 2021-03-16 ENCOUNTER — Ambulatory Visit (INDEPENDENT_AMBULATORY_CARE_PROVIDER_SITE_OTHER): Payer: No Typology Code available for payment source | Admitting: Pharmacist

## 2021-03-16 VITALS — BP 128/82 | HR 85

## 2021-03-16 DIAGNOSIS — I10 Essential (primary) hypertension: Secondary | ICD-10-CM | POA: Diagnosis not present

## 2021-03-16 DIAGNOSIS — E118 Type 2 diabetes mellitus with unspecified complications: Secondary | ICD-10-CM | POA: Diagnosis not present

## 2021-03-16 DIAGNOSIS — R931 Abnormal findings on diagnostic imaging of heart and coronary circulation: Secondary | ICD-10-CM

## 2021-03-16 DIAGNOSIS — E781 Pure hyperglyceridemia: Secondary | ICD-10-CM | POA: Diagnosis not present

## 2021-03-16 DIAGNOSIS — E785 Hyperlipidemia, unspecified: Secondary | ICD-10-CM | POA: Diagnosis not present

## 2021-03-16 LAB — HEPATIC FUNCTION PANEL
ALT: 18 IU/L (ref 0–32)
AST: 15 IU/L (ref 0–40)
Albumin: 4.8 g/dL (ref 3.8–4.9)
Alkaline Phosphatase: 39 IU/L — ABNORMAL LOW (ref 44–121)
Bilirubin Total: 0.3 mg/dL (ref 0.0–1.2)
Bilirubin, Direct: 0.14 mg/dL (ref 0.00–0.40)
Total Protein: 7.1 g/dL (ref 6.0–8.5)

## 2021-03-16 LAB — LIPID PANEL
Chol/HDL Ratio: 3.3 ratio (ref 0.0–4.4)
Cholesterol, Total: 144 mg/dL (ref 100–199)
HDL: 44 mg/dL (ref >39)
LDL Chol Calc (NIH): 84 mg/dL (ref 0–99)
Triglycerides: 83 mg/dL (ref 0–149)
VLDL Cholesterol Cal: 16 mg/dL (ref 5–40)

## 2021-03-16 LAB — LDL CHOLESTEROL, DIRECT: LDL Direct: 84 mg/dL (ref 0–99)

## 2021-03-16 NOTE — Patient Instructions (Addendum)
Continue hydralazine 50 mg three times a day, isosorbide dinitrate 20 mg three times a day, spironolactone 25 mg daily, Entresto 97/103 mg twice a day, carvedilol 25 mg twice a day.

## 2021-03-16 NOTE — Progress Notes (Signed)
Patient ID: AMIYRAH LAMERE                 DOB: [birth year]                      MRN: [id number]     HPI: Carolyn [name] is a 60 y.o. female referred by Dr. Johnsie Cancel to pharmacy clinic for HF medication management. PMH is significant for HTN, OSA, anemia, DM and recent diagnosis of DCM. Most recent LVEF 35 to 40% on 11/23/20.   At last PharmD visit on 10/13, BPs at home were mostly within goal with most readings 110-120s/70s with a few in the 130's/80-90's. She reported feeling a bit tired at times and endorses a bit of dizziness that only lasts a few seconds. She also reported being less restrictive with sodium and continuing healthy eating habits. She mentioned recently having frequent nightmares but does not believe this is related to the medication. Clonidine was decreased to 0.05 mg qhs for 5 days and then discontinued. Labetalol was also discontinued and patient was started on carvedilol 6.25 mg BID. At last visit carvedilol was increased to 25 mg BID. She has been on SGLT2i in the past and had yeast infection. Is prone to yeast infections. Her Rybelsus was changed to Ozempic.   Patient presents today for follow up. She admits just to a very little dizziness, lightheadedness. No swelling or SOB. Her home BP has been 117/78-120/80. Hasn't taken meds this AM yet. Has taken 2 doses of Ozempic 76m. Has 1 more box of the 116mat home. Doing fine with it. Enjoying the [employer] prep class. Going to class 2 days a week and then to the gym extra days in the AM. Sometimes misses the mid day dose of isosorbide and hydralazine.  Current CHF meds: hydralazine 50 mg TID, isosorbide dinitrate 20 mg TID, spironolactone 25 mg daily, Entresto 97/103 mg BID, carvedilol 25 mg BID Other BP Meds: Previously tried: Invokana (yeast infection) BP goal: <130/80   Family History: The patient's family history includes Alzheimer's disease in her mother; Breast cancer in her sister; Cancer in her sister; Diabetes in her brother,  sister, and sister; Emphysema in her father and sister; Hypertension in her mother; Thyroid cancer in her sister. There is no history of Colon cancer.  Home BP: 120/80 117/78  Exercise: [employer] class twice a week, going to gym extra   Wt Readings from Last 3 Encounters:  03/15/21 178 lb (80.7 kg)  03/12/21 174 lb 6.4 oz (79.1 kg)  03/05/21 177 lb 12.8 oz (80.6 kg)   BP Readings from Last 3 Encounters:  03/15/21 126/82  02/22/21 124/90  02/20/21 128/70   Pulse Readings from Last 3 Encounters:  03/15/21 85  02/22/21 93  02/20/21 74    Renal function: CrCl cannot be calculated (Patient's most recent lab result is older than the maximum 21 days allowed.).  Past Medical History:  Diagnosis Date   Allergy    seasonal   Anemia    Anemia of chronic disease suspected   Anxiety attack    Arthritis    Benign fundic gland polyps of stomach    Dyslipidemia    Exertional shortness of breath    GERD (gastroesophageal reflux disease)    Heart murmur    Hyperlipidemia    Hypertension    Migraines    "probably weekly" (01/14/2013)   [place] II diabetes mellitus [place]    Current Outpatient Medications on File  Prior to Visit  Medication Sig Dispense Refill   Ascorbic Acid (VITAMIN C) 1000 MG tablet Take 1,500 mg by mouth daily.     blood glucose meter kit and supplies KIT Dispense based on patient and insurance preference. Use up to four times daily as directed. (FOR ICD-9 250.00, 250.01). 1 each 0   Blood Glucose Monitoring Suppl (ONETOUCH VERIO IQ SYSTEM) W/DEVICE KIT 1 Act by Does not apply route 3 (three) times daily. 2 kit 0   calcium carbonate (OS-CAL - DOSED IN MG OF ELEMENTAL CALCIUM) 1250 (500 Ca) MG tablet Take 2 tablets by mouth daily with breakfast.     carvedilol (COREG) 25 MG tablet Take 1 tablet (25 mg total) by mouth 2 (two) times daily. 180 tablet 3   cholecalciferol (VITAMIN D3) 25 MCG (1000 UNIT) tablet Take 2,000 Units by mouth daily.     clonazePAM (KLONOPIN) 1 MG  tablet Take 1 tablet (1 mg total) by mouth at bedtime. 90 tablet 1   Diclofenac Sodium 2 % SOLN Place 2 g onto the skin 2 (two) times daily. 112 g 3   EPINEPHrine 0.3 mg/0.3 mL IJ SOAJ injection INJECT 0.3 MLS INTO THE MUSCLE ONCE FOR 1 DOSE  0   fenofibrate (TRICOR) 145 MG tablet Take 1 tablet (145 mg total) by mouth daily. 30 tablet 5   Fexofenadine HCl (ALLEGRA ALLERGY PO) Take by mouth.     Flaxseed, Linseed, (FLAX SEED OIL) 1000 MG CAPS Take by mouth.     fluticasone (FLONASE) 50 MCG/ACT nasal spray Place 2 sprays into both nostrils daily. 16 g 6   gabapentin (NEURONTIN) 100 MG capsule Take 2 capsules (200 mg total) by mouth at bedtime. 180 capsule 3   glucose blood (ONE TOUCH ULTRA TEST) test strip Use to test blood sugar three times a week and prn if having symptoms of low blood sugar Dx: 250.92 90 day supply 100 each 11   hydrALAZINE (APRESOLINE) 50 MG tablet Take 1 tablet (50 mg total) by mouth 3 (three) times daily. 270 tablet 3   Insulin Pen Needle (BD PEN NEEDLE MICRO U/F) 32G X 6 MM MISC Inject 1 Units into the skin once a week. 100 each 3   iron polysaccharides (NIFEREX) 150 MG capsule Take 150 mg by mouth daily.     isosorbide dinitrate (ISORDIL) 20 MG tablet Take 1 tablet (20 mg total) by mouth 3 (three) times daily. 90 tablet 3   Lancets (ONETOUCH DELICA PLUS [id number]) MISC USE UP TO 4 TIMES DAILY AS DIRECTED  1   LINZESS 145 MCG CAPS capsule TAKE 1 CAPSULE BY MOUTH ONCE DAILY BEFORE BREAKFAST 90 capsule 1   Multiple Vitamins-Minerals (HAIR SKIN AND NAILS FORMULA) TABS Take by mouth.     omega-3 acid ethyl esters (LOVAZA) 1 g capsule Take 2 capsules (2 g total) by mouth 2 (two) times daily. 60 capsule 5   omeprazole (PRILOSEC) 40 MG capsule TAKE 1 CAPSULE BY MOUTH TWICE DAILY AT  8AM  AND  10PM 180 capsule 1   ondansetron (ZOFRAN) 4 MG tablet Take 1 tablet (4 mg total) by mouth every 8 (eight) hours as needed for nausea or vomiting. 30 tablet 4   pyridoxine (B-6) 100 MG tablet  Take 100 mg by mouth daily.     Rimegepant Sulfate (NURTEC) 75 MG TBDP Take 1 tablet by mouth daily as needed. 8 tablet 0   sacubitril-valsartan (ENTRESTO) 97-103 MG Take 1 tablet by mouth 2 (two) times daily. 60 tablet  11   Semaglutide, 1 MG/DOSE, (OZEMPIC, 1 MG/DOSE,) 4 MG/3ML SOPN Inject 1 mg into the skin once a week. 3 mL 3   simvastatin (ZOCOR) 40 MG tablet TAKE 1 TABLET BY MOUTH AT BEDTIME 90 tablet 1   spironolactone (ALDACTONE) 25 MG tablet Take 1 tablet (25 mg total) by mouth daily. 90 tablet 3   tizanidine (ZANAFLEX) 2 MG capsule Take 1 capsule (2 mg total) by mouth 3 (three) times daily. 30 capsule 3   zinc gluconate 50 MG tablet Take 100 mg by mouth daily.     No current facility-administered medications on file prior to visit.    Allergies  Allergen Reactions   Invokana [Canagliflozin] Other (See Comments)    YEAST INFECTIONS   Codeine Nausea And Vomiting   Flexeril [Cyclobenzaprine]     sedation   Morphine And Related Itching   Reglan [Metoclopramide] Other (See Comments)    "paralyzes me"   Silicon     In watch bands     Assessment/Plan:  1. CHF -  BP is near goal of <130/80 in clinic today. She has not taken her medications yet today, I do not see any need to make medication changes today. Continue hydralazine 50 mg TID, isosorbide dinitrate 20 mg TID, spironolactone 25 mg daily, Entresto 97/103 mg BID and carvedilol 76m BID. Follow up in 4 weeks per pt request.   2. DM / weight loss - Patient doing well on Ozempic 1 mg. Continue for 6 more injections since she already purchased the second box. At that point can consider increasing to 267m or staying at 1 mg. [facility]  3. Hyperlipidemia- Rechecking lipid panel today since fenofibrate was added in Sept.    D , Pharm.D, BCPS, CPP [id number]. Ch[address]GrBaskerville[place] [id number]: [phone]: [phone]

## 2021-03-19 NOTE — Progress Notes (Signed)
[employer] PREP Weekly Session  Patient Details  Name: Carolyn [name] MRN: [id number] Date of Birth: February 28, 1961 Age: 60 y.o. PCP: [name], MD  Vitals:   03/19/21 0923  Weight: 177 lb 1.6 oz (80.3 kg)     [employer] Weekly seesion - 03/19/21 0900       [employer] "PREP" Location   [facility] "PREP" Location [facility]      Weekly Session   Topic Discussed Stress management and problem solving   Breathwork mudra and guided meditation   Minutes exercised this week 95 minutes    Classes attended to date [place] 03/19/2021, 9:24 AM

## 2021-03-26 NOTE — Progress Notes (Signed)
[employer] PREP Weekly Session  Patient Details  Name: Carolyn [name] MRN: [id number] Date of Birth: 12-13-1960 Age: 60 y.o. PCP: [name], MD  Vitals:   03/26/21 0920  Weight: 175 lb 12.8 oz (79.7 kg)     [employer] Weekly seesion - 03/26/21 0900       [facility] "PREP" Location   [facility] "PREP" Location [facility]      Weekly Session   Topic Discussed Expectations and non-scale victories   Halfway through program, review/revisit/reset goals   Minutes exercised this week 95 minutes    Classes attended to date [place] 03/26/2021, 9:21 AM

## 2021-04-17 ENCOUNTER — Encounter: Payer: Self-pay | Admitting: Pharmacist

## 2021-04-17 ENCOUNTER — Ambulatory Visit: Payer: No Typology Code available for payment source

## 2021-04-18 ENCOUNTER — Encounter: Payer: Self-pay | Admitting: Internal Medicine

## 2021-04-19 ENCOUNTER — Encounter: Payer: Self-pay | Admitting: Family Medicine

## 2021-04-19 ENCOUNTER — Other Ambulatory Visit: Payer: Self-pay

## 2021-04-19 ENCOUNTER — Telehealth (INDEPENDENT_AMBULATORY_CARE_PROVIDER_SITE_OTHER): Payer: No Typology Code available for payment source | Admitting: Family Medicine

## 2021-04-19 VITALS — Temp 98.8°F

## 2021-04-19 DIAGNOSIS — R059 Cough, unspecified: Secondary | ICD-10-CM

## 2021-04-19 MED ORDER — DOXYCYCLINE HYCLATE 100 MG PO TABS: 100.0000 mg | ORAL_TABLET | Freq: Two times a day (BID) | ORAL | 0 refills | Status: DC

## 2021-04-19 MED ORDER — BENZONATATE 100 MG PO CAPS: ORAL_CAPSULE | ORAL | 0 refills | Status: DC

## 2021-04-19 NOTE — Patient Instructions (Signed)
-  I sent the medication(s) we discussed to your pharmacy: Meds ordered this encounter  Medications   doxycycline (VIBRA-TABS) 100 MG tablet    Sig: Take 1 tablet (100 mg total) by mouth 2 (two) times daily.    Dispense:  14 tablet    Refill:  0   benzonatate (TESSALON PERLES) 100 MG capsule    Sig: 1-2 capsules up to twice daily as needed for cough    Dispense:  30 capsule    Refill:  0     I hope you are feeling better soon!  Seek in person care promptly if your symptoms worsen, new concerns arise or you are not improving with treatment.  It was nice to meet you today. I help [place] out with telemedicine visits on Tuesdays and Thursdays and am happy to help if you need a virtual follow up visit on those days. Otherwise, if you have any concerns or questions following this visit please schedule a follow up visit with your Primary Care office or seek care at a local urgent care clinic to avoid delays in care

## 2021-04-19 NOTE — Progress Notes (Signed)
Virtual Visit via Video Note  I connected with Carolyn [name]  on 04/19/21 at  1:30 PM EST by a video enabled telemedicine application and verified that I am speaking with the correct person using two identifiers.  Location patient: [place] Location provider:work or home office Persons participating in the virtual visit: patient, provider  I discussed the limitations and requested verbal permission for telemedicine visit. The patient expressed understanding and agreed to proceed.   HPI:  Acute telemedicine visit for flu like illness: -Onset: 9 days ago -went to [facility] and covid test was negative, reports they did not test  -Symptoms include: had fevers, nasal congestion, cough, fatigue, body aches, emesis x1 initially -several covid tests were negative -doing better now with resolution of fever, but still has a cough and some fatigue -Denies: SOB, SP, remaining fevers -Pertinent past medical history: see below, hx of pneumonia and worries about that as mucus she is coughing up has turned yellow  -Pertinent medication allergies: Allergies  Allergen Reactions   Invokana [Canagliflozin] Other (See Comments)    YEAST INFECTIONS   Codeine Nausea And Vomiting   Flexeril [Cyclobenzaprine]     sedation   Morphine And Related Itching   Reglan [Metoclopramide] Other (See Comments)    "paralyzes me"   Silicon     In watch bands  -COVID-19 vaccine status:  Immunization History  Administered Date(s) Administered   Influenza,inj,Quad PF,6+ Mos 02/22/2019, 01/11/2020, 01/11/2021   Moderna Sars-Covid-2 Vaccination 07/02/2019, 08/02/2019, 02/26/2020   Pneumococcal Conjugate-13 03/07/2020   Pneumococcal Polysaccharide-23 02/22/2019   Tdap 11/30/2012, 04/14/2019   Zoster Recombinat (Shingrix) 01/11/2020, 05/08/2020     ROS: See pertinent positives and negatives per HPI.  Past Medical History:  Diagnosis Date   Allergy    seasonal   Anemia    Anemia of chronic disease suspected   Anxiety attack     Arthritis    Benign fundic gland polyps of stomach    Dyslipidemia    Exertional shortness of breath    GERD (gastroesophageal reflux disease)    Heart murmur    Hyperlipidemia    Hypertension    Migraines    "probably weekly" (01/14/2013)   Type II diabetes mellitus ([place])     Past Surgical History:  Procedure Laterality Date   CHOLECYSTECTOMY  1990   COLONOSCOPY     ENDOMETRIAL ABLATION  ~ 2000-2002   "twice" (01/14/2013)   LEFT HEART CATHETERIZATION WITH CORONARY ANGIOGRAM N/A 01/15/2013   Procedure: LEFT HEART CATHETERIZATION WITH CORONARY ANGIOGRAM;  Surgeon: Blane Ohara, MD;  Location: [facility] CATH LAB;  Service: Cardiovascular;  Laterality: N/A;   LEFT OOPHORECTOMY Left ~ 2003   ORIF TIBIA & FIBULA FRACTURES Left 2003   VAGINAL HYSTERECTOMY  ~ 2003     Current Outpatient Medications:    Ascorbic Acid (VITAMIN C) 1000 MG tablet, Take 1,500 mg by mouth daily., Disp: , Rfl:    benzonatate (TESSALON PERLES) 100 MG capsule, 1-2 capsules up to twice daily as needed for cough, Disp: 30 capsule, Rfl: 0   blood glucose meter kit and supplies KIT, Dispense based on patient and insurance preference. Use up to four times daily as directed. (FOR ICD-9 250.00, 250.01)., Disp: 1 each, Rfl: 0   Blood Glucose Monitoring Suppl (ONETOUCH VERIO IQ SYSTEM) W/DEVICE KIT, 1 Act by Does not apply route 3 (three) times daily., Disp: 2 kit, Rfl: 0   calcium carbonate (OS-CAL - DOSED IN MG OF ELEMENTAL CALCIUM) 1250 (500 Ca) MG tablet, Take 2 tablets  by mouth daily with breakfast., Disp: , Rfl:    carvedilol (COREG) 25 MG tablet, Take 1 tablet (25 mg total) by mouth 2 (two) times daily., Disp: 180 tablet, Rfl: 3   cholecalciferol (VITAMIN D3) 25 MCG (1000 UNIT) tablet, Take 2,000 Units by mouth daily., Disp: , Rfl:    clonazePAM (KLONOPIN) 1 MG tablet, Take 1 tablet (1 mg total) by mouth at bedtime., Disp: 90 tablet, Rfl: 1   doxycycline (VIBRA-TABS) 100 MG tablet, Take 1 tablet (100 mg total) by mouth 2  (two) times daily., Disp: 14 tablet, Rfl: 0   EPINEPHrine 0.3 mg/0.3 mL IJ SOAJ injection, INJECT 0.3 MLS INTO THE MUSCLE ONCE FOR 1 DOSE, Disp: , Rfl: 0   fenofibrate (TRICOR) 145 MG tablet, Take 1 tablet (145 mg total) by mouth daily., Disp: 30 tablet, Rfl: 5   Flaxseed, Linseed, (FLAX SEED OIL) 1000 MG CAPS, Take by mouth., Disp: , Rfl:    glucose blood (ONE TOUCH ULTRA TEST) test strip, Use to test blood sugar three times a week and prn if having symptoms of low blood sugar Dx: 250.92 90 day supply, Disp: 100 each, Rfl: 11   hydrALAZINE (APRESOLINE) 50 MG tablet, Take 1 tablet (50 mg total) by mouth 3 (three) times daily., Disp: 270 tablet, Rfl: 3   Insulin Pen Needle (BD PEN NEEDLE MICRO U/F) 32G X 6 MM MISC, Inject 1 Units into the skin once a week., Disp: 100 each, Rfl: 3   iron polysaccharides (NIFEREX) 150 MG capsule, Take 150 mg by mouth daily., Disp: , Rfl:    isosorbide dinitrate (ISORDIL) 20 MG tablet, Take 1 tablet (20 mg total) by mouth 3 (three) times daily., Disp: 90 tablet, Rfl: 3   Lancets (ONETOUCH DELICA PLUS [id number]) MISC, USE UP TO 4 TIMES DAILY AS DIRECTED, Disp: , Rfl: 1   levocetirizine (XYZAL) 5 MG tablet, Take 5 mg by mouth every evening., Disp: , Rfl:    LINZESS 145 MCG CAPS capsule, TAKE 1 CAPSULE BY MOUTH ONCE DAILY BEFORE BREAKFAST, Disp: 90 capsule, Rfl: 1   Multiple Vitamins-Minerals (HAIR SKIN AND NAILS FORMULA) TABS, Take by mouth., Disp: , Rfl:    omega-3 acid ethyl esters (LOVAZA) 1 g capsule, Take 2 capsules (2 g total) by mouth 2 (two) times daily., Disp: 60 capsule, Rfl: 5   omeprazole (PRILOSEC) 40 MG capsule, TAKE 1 CAPSULE BY MOUTH TWICE DAILY AT  8AM  AND  10PM, Disp: 180 capsule, Rfl: 1   ondansetron (ZOFRAN) 4 MG tablet, Take 1 tablet (4 mg total) by mouth every 8 (eight) hours as needed for nausea or vomiting., Disp: 30 tablet, Rfl: 4   Rimegepant Sulfate (NURTEC) 75 MG TBDP, Take 1 tablet by mouth daily as needed., Disp: 8 tablet, Rfl: 0    sacubitril-valsartan (ENTRESTO) 97-103 MG, Take 1 tablet by mouth 2 (two) times daily., Disp: 60 tablet, Rfl: 11   Semaglutide, 1 MG/DOSE, (OZEMPIC, 1 MG/DOSE,) 4 MG/3ML SOPN, Inject 1 mg into the skin once a week., Disp: 3 mL, Rfl: 3   simvastatin (ZOCOR) 40 MG tablet, TAKE 1 TABLET BY MOUTH AT BEDTIME, Disp: 90 tablet, Rfl: 1   spironolactone (ALDACTONE) 25 MG tablet, Take 1 tablet (25 mg total) by mouth daily., Disp: 90 tablet, Rfl: 3   tizanidine (ZANAFLEX) 2 MG capsule, Take 1 capsule (2 mg total) by mouth 3 (three) times daily., Disp: 30 capsule, Rfl: 3   zinc gluconate 50 MG tablet, Take 100 mg by mouth daily., Disp: ,  Rfl:   EXAM:  VITALS per patient if applicable:  GENERAL: alert, oriented, appears well and in no acute distress  HEENT: atraumatic, conjunttiva clear, no obvious abnormalities on inspection of external nose and ears  NECK: normal movements of the head and neck  LUNGS: on inspection no signs of respiratory distress, breathing rate appears normal, no obvious gross SOB, gasping or wheezing  CV: no obvious cyanosis  MS: moves all visible extremities without noticeable abnormality  PSYCH/NEURO: pleasant and cooperative, no obvious depression or anxiety, speech and thought processing grossly intact  ASSESSMENT AND PLAN:  Discussed the following assessment and plan:  Cough, unspecified type  -we discussed possible serious and likely etiologies, options for evaluation and workup, limitations of telemedicine visit vs in person visit, treatment, treatment risks and precautions. Pt is agreeable to treatment via telemedicine at this moment. Likely influenza with postviral cough vs other.  She is concerned about possible developing pneumonia as coughed up some yellow mucus today. Opted for nasal saline, cough medication and did provide rx for doxy incase worsening or not continuing to improve over the next few days.   Advised to seek prompt virtual visit or in person care  if worsening, new symptoms arise, or if is not improving with treatment as expected per our conversation of expected course. Discussed options for follow up care. Did let this patient know that I do telemedicine on Tuesdays and Thursdays for [place] and those are the days I am logged into the system. Advised to schedule follow up visit with PCP, [place] virtual visits or [facility] if any further questions or concerns to avoid delays in care.   I discussed the assessment and treatment plan with the patient. The patient was provided an opportunity to ask questions and all were answered. The patient agreed with the plan and demonstrated an understanding of the instructions.     Lucretia Kern, DO

## 2021-04-23 ENCOUNTER — Encounter: Payer: Self-pay | Admitting: Family Medicine

## 2021-04-23 ENCOUNTER — Other Ambulatory Visit: Payer: Self-pay | Admitting: Internal Medicine

## 2021-04-23 NOTE — Progress Notes (Signed)
[employer] PREP Weekly Session  Patient Details  Name: LORAIN FETTES MRN: [id number] Date of Birth: 11-15-1960 Age: 61 y.o. PCP: [name], MD  Vitals:   04/23/21 0926  Weight: 172 lb 12.8 oz (78.4 kg)     [facility] Weekly seesion - 04/23/21 0900       [facility] "PREP" Location   [facility] "PREP" Location [facility]      Weekly Session   Topic Discussed Calorie breakdown   Food/nutrition label review; [employer] talk rescheduled for next week   Classes attended to date [place] 04/23/2021, 9:27 AM

## 2021-04-25 ENCOUNTER — Encounter: Payer: Self-pay | Admitting: Internal Medicine

## 2021-04-26 ENCOUNTER — Encounter: Payer: Self-pay | Admitting: Internal Medicine

## 2021-04-26 ENCOUNTER — Ambulatory Visit: Payer: No Typology Code available for payment source

## 2021-04-26 ENCOUNTER — Other Ambulatory Visit: Payer: Self-pay

## 2021-04-26 ENCOUNTER — Ambulatory Visit (INDEPENDENT_AMBULATORY_CARE_PROVIDER_SITE_OTHER): Payer: No Typology Code available for payment source

## 2021-04-26 ENCOUNTER — Ambulatory Visit: Payer: No Typology Code available for payment source | Admitting: Internal Medicine

## 2021-04-26 VITALS — BP 124/78 | HR 94 | Temp 98.2°F | Ht 64.0 in | Wt 179.0 lb

## 2021-04-26 DIAGNOSIS — R052 Subacute cough: Secondary | ICD-10-CM

## 2021-04-26 DIAGNOSIS — J4541 Moderate persistent asthma with (acute) exacerbation: Secondary | ICD-10-CM | POA: Diagnosis not present

## 2021-04-26 DIAGNOSIS — J45909 Unspecified asthma, uncomplicated: Secondary | ICD-10-CM

## 2021-04-26 HISTORY — DX: Unspecified asthma, uncomplicated: J45.909

## 2021-04-26 MED ORDER — BUDESONIDE-FORMOTEROL FUMARATE 80-4.5 MCG/ACT IN AERO: 2.0000 | INHALATION_SPRAY | Freq: Two times a day (BID) | RESPIRATORY_TRACT | 0 refills | Status: DC

## 2021-04-26 MED ORDER — METHYLPREDNISOLONE 4 MG PO TBPK: ORAL_TABLET | ORAL | 0 refills | Status: AC

## 2021-04-26 MED ORDER — HYDROCODONE BIT-HOMATROP MBR 5-1.5 MG/5ML PO SOLN: 5.0000 mL | Freq: Three times a day (TID) | ORAL | 0 refills | Status: AC | PRN

## 2021-04-26 NOTE — Progress Notes (Signed)
Subjective:  Patient ID: Carolyn [name], female    DOB: 12-07-1960  Age: 61 y.o. MRN: [id number]  CC: Cough (X 3 weeks, chest congestion)  This visit occurred during the SARS-CoV-2 public health emergency.  Safety protocols were in place, including screening questions prior to the visit, additional usage of staff PPE, and extensive cleaning of exam room while observing appropriate contact time as indicated for disinfecting solutions.    HPI Carolyn [name] presents for f/up -  She developed a sore throat about 3 weeks ago now she has a nonproductive cough with night sweats, shortness of breath, and wheezing.  She has been prescribed doxycycline and Tessalon Perles.  She tells me that Carolyn [name] are not controlling her cough.  Outpatient Medications Prior to Visit  Medication Sig Dispense Refill   Blood Glucose Monitoring Suppl (ONETOUCH VERIO IQ SYSTEM) W/DEVICE KIT 1 Act by Does not apply route 3 (three) times daily. 2 kit 0   calcium carbonate (OS-CAL - DOSED IN MG OF ELEMENTAL CALCIUM) 1250 (500 Ca) MG tablet Take 2 tablets by mouth daily with breakfast.     carvedilol (COREG) 25 MG tablet Take 1 tablet (25 mg total) by mouth 2 (two) times daily. 180 tablet 3   cholecalciferol (VITAMIN D3) 25 MCG (1000 UNIT) tablet Take 2,000 Units by mouth daily.     clonazePAM (KLONOPIN) 1 MG tablet Take 1 tablet (1 mg total) by mouth at bedtime. 90 tablet 1   EPINEPHrine 0.3 mg/0.3 mL IJ SOAJ injection INJECT 0.3 MLS INTO THE MUSCLE ONCE FOR 1 DOSE  0   fenofibrate (TRICOR) 145 MG tablet Take 1 tablet (145 mg total) by mouth daily. 30 tablet 5   Flaxseed, Linseed, (FLAX SEED OIL) 1000 MG CAPS Take by mouth.     glucose blood (ONE TOUCH ULTRA TEST) test strip Use to test blood sugar three times a week and prn if having symptoms of low blood sugar Dx: 250.92 90 day supply 100 each 11   hydrALAZINE (APRESOLINE) 50 MG tablet Take 1 tablet (50 mg total) by mouth 3 (three) times daily. 270 tablet 3    Insulin Pen Needle (BD PEN NEEDLE MICRO U/F) 32G X 6 MM MISC Inject 1 Units into the skin once a week. 100 each 3   iron polysaccharides (NIFEREX) 150 MG capsule Take 150 mg by mouth daily.     isosorbide dinitrate (ISORDIL) 20 MG tablet Take 1 tablet (20 mg total) by mouth 3 (three) times daily. 90 tablet 3   Lancets (ONETOUCH DELICA PLUS [id number]) MISC USE UP TO 4 TIMES DAILY AS DIRECTED  1   levocetirizine (XYZAL) 5 MG tablet Take 5 mg by mouth every evening.     LINZESS 145 MCG CAPS capsule TAKE 1 CAPSULE BY MOUTH ONCE DAILY BEFORE BREAKFAST 90 capsule 1   Multiple Vitamins-Minerals (HAIR SKIN AND NAILS FORMULA) TABS Take by mouth.     omega-3 acid ethyl esters (LOVAZA) 1 g capsule Take 2 capsules (2 g total) by mouth 2 (two) times daily. 60 capsule 5   omeprazole (PRILOSEC) 40 MG capsule TAKE 1 CAPSULE BY MOUTH TWICE DAILY AT  8AM  AND  10PM 180 capsule 1   sacubitril-valsartan (ENTRESTO) 97-103 MG Take 1 tablet by mouth 2 (two) times daily. 60 tablet 11   Semaglutide, 1 MG/DOSE, (OZEMPIC, 1 MG/DOSE,) 4 MG/3ML SOPN Inject 1 mg into the skin once a week. 3 mL 3   simvastatin (ZOCOR) 40 MG tablet TAKE 1 TABLET  BY MOUTH AT BEDTIME 90 tablet 1   spironolactone (ALDACTONE) 25 MG tablet Take 1 tablet (25 mg total) by mouth daily. 90 tablet 3   zinc gluconate 50 MG tablet Take 100 mg by mouth daily.     Ascorbic Acid (VITAMIN C) 1000 MG tablet Take 1,500 mg by mouth daily.     benzonatate (TESSALON PERLES) 100 MG capsule 1-2 capsules up to twice daily as needed for cough 30 capsule 0   blood glucose meter kit and supplies KIT Dispense based on patient and insurance preference. Use up to four times daily as directed. (FOR ICD-9 250.00, 250.01). 1 each 0   doxycycline (VIBRA-TABS) 100 MG tablet Take 1 tablet (100 mg total) by mouth 2 (two) times daily. 14 tablet 0   ondansetron (ZOFRAN) 4 MG tablet Take 1 tablet (4 mg total) by mouth every 8 (eight) hours as needed for nausea or vomiting. 30 tablet 4    Rimegepant Sulfate (NURTEC) 75 MG TBDP Take 1 tablet by mouth daily as needed. 8 tablet 0   tizanidine (ZANAFLEX) 2 MG capsule Take 1 capsule (2 mg total) by mouth 3 (three) times daily. 30 capsule 3   No facility-administered medications prior to visit.    ROS Review of Systems  Constitutional:  Negative for chills, diaphoresis, fatigue and fever.  HENT: Negative.  Negative for sore throat.   Respiratory:  Positive for cough, shortness of breath and wheezing. Negative for chest tightness.   Cardiovascular:  Negative for chest pain, palpitations and leg swelling.  Gastrointestinal:  Negative for abdominal pain, diarrhea, nausea and vomiting.  Genitourinary: Negative.   Musculoskeletal: Negative.   Skin: Negative.   Neurological: Negative.  Negative for dizziness, weakness and light-headedness.  Hematological:  Negative for adenopathy. Does not bruise/bleed easily.  Psychiatric/Behavioral: Negative.     Objective:  BP 124/78    Pulse 94    Temp 98.2 F (36.8 C) (Rectal)    Ht '5\' 4"'  (1.626 m)    Wt 179 lb (81.2 kg)    SpO2 96%    BMI 30.73 kg/m   BP Readings from Last 3 Encounters:  04/26/21 124/78  03/16/21 128/82  03/15/21 126/82    Wt Readings from Last 3 Encounters:  04/26/21 179 lb (81.2 kg)  04/23/21 172 lb 12.8 oz (78.4 kg)  03/26/21 175 lb 12.8 oz (79.7 kg)    Physical Exam Vitals reviewed.  Constitutional:      Appearance: She is not ill-appearing.  HENT:     Nose: Nose normal.     Mouth/Throat:     Mouth: Mucous membranes are moist.  Eyes:     General: No scleral icterus.    Conjunctiva/sclera: Conjunctivae normal.  Cardiovascular:     Rate and Rhythm: Normal rate and regular rhythm.     Heart sounds: No murmur heard. Pulmonary:     Effort: Pulmonary effort is normal.     Breath sounds: No stridor. No wheezing, rhonchi or rales.  Abdominal:     General: Abdomen is flat.     Palpations: There is no mass.     Tenderness: There is no abdominal  tenderness. There is no guarding.     Hernia: No hernia is present.  Musculoskeletal:     Cervical back: Neck supple.     Right lower leg: No edema.     Left lower leg: No edema.  Lymphadenopathy:     Cervical: No cervical adenopathy.  Skin:    General: Skin is warm  and dry.  Neurological:     General: No focal deficit present.     Mental Status: She is alert. Mental status is at baseline.  Psychiatric:        Mood and Affect: Mood normal.        Behavior: Behavior normal.    Lab Results  Component Value Date   WBC 7.8 04/26/2021   HGB 9.9 (L) 04/26/2021   HCT 29.5 (L) 04/26/2021   PLT 391.0 04/26/2021   GLUCOSE 262 (H) 01/01/2021   CHOL 144 03/16/2021   TRIG 83 03/16/2021   HDL 44 03/16/2021   LDLDIRECT 84 03/16/2021   LDLCALC 84 03/16/2021   ALT 18 03/16/2021   AST 15 03/16/2021   NA 137 01/01/2021   K 4.4 01/01/2021   CL 102 01/01/2021   CREATININE 1.04 (H) 01/01/2021   BUN 16 01/01/2021   CO2 23 01/01/2021   TSH 2.21 06/08/2020   INR 1.04 01/14/2013   HGBA1C 6.5 01/11/2021   MICROALBUR 56.1 (H) 01/11/2021    MM 3D SCREEN BREAST BILATERAL  Result Date: 12/18/2020 CLINICAL DATA:  Screening. EXAM: DIGITAL SCREENING BILATERAL MAMMOGRAM WITH TOMOSYNTHESIS AND CAD TECHNIQUE: Bilateral screening digital craniocaudal and mediolateral oblique mammograms were obtained. Bilateral screening digital breast tomosynthesis was performed. The images were evaluated with computer-aided detection. COMPARISON:  Previous exam(s). ACR Breast Density Category b: There are scattered areas of fibroglandular density. FINDINGS: There are no findings suspicious for malignancy. IMPRESSION: No mammographic evidence of malignancy. A result letter of this screening mammogram will be mailed directly to the patient. RECOMMENDATION: Screening mammogram in one year. (Code:SM-B-01Y) BI-RADS CATEGORY  1: Negative. Electronically Signed   By: Lovey Newcomer M.D.   On: 12/18/2020 16:09   DG Chest 2  View  Result Date: 04/26/2021 CLINICAL DATA:  Cough.  Wheezing. EXAM: CHEST - 2 VIEW COMPARISON:  Radiograph 02/08/2016, included portions from cardiac CT 11/07/2020 FINDINGS: Chronic cardiomegaly. Unchanged mediastinal contours. Aortic atherosclerosis. Mild vascular congestion without pulmonary edema. No pleural effusions. No focal airspace disease. No pneumothorax. No acute osseous abnormalities are seen. IMPRESSION: Chronic cardiomegaly with mild vascular congestion. Electronically Signed   By: Keith Rake M.D.   On: 04/26/2021 16:41     Assessment & Plan:   Sehar was seen today for cough.  Diagnoses and all orders for this visit:  Subacute cough- Her COVID antibody is slightly positive.  This may be contributing to her symptoms.  Her chest x-ray is negative for mass or infiltrate.  Will treat for acute asthmatic bronchitis with a course of methylprednisolone and a LABA/ICS inhaler. -     DG Chest 2 View; Future -     SARS-COV-2 IgG; Future -     CBC with Differential/Platelet; Future -     HYDROcodone bit-homatropine (HYCODAN) 5-1.5 MG/5ML syrup; Take 5 mLs by mouth every 8 (eight) hours as needed for up to 7 days for cough. -     CBC with Differential/Platelet -     SARS-COV-2 IgG  Moderate persistent asthmatic bronchitis with acute exacerbation -     methylPREDNISolone (MEDROL DOSEPAK) 4 MG TBPK tablet; TAKE AS DIRECTED -     budesonide-formoterol (SYMBICORT) 80-4.5 MCG/ACT inhaler; Inhale 2 puffs into the lungs 2 (two) times daily.   I have discontinued Marianne H. Leicht's blood glucose meter kit and supplies, Nurtec, ondansetron, tizanidine, vitamin C, doxycycline, and benzonatate. I am also having her start on HYDROcodone bit-homatropine, methylPREDNISolone, and budesonide-formoterol. Additionally, I am having her maintain her glucose blood,  OneTouch Verio IQ System, EPINEPHrine, OneTouch Delica Plus [id number], clonazePAM, spironolactone, cholecalciferol, Flax Seed Oil, Hair Skin  and Nails Formula, calcium carbonate, zinc gluconate, iron polysaccharides, Entresto, BD Pen Needle Micro U/F, isosorbide dinitrate, fenofibrate, omega-3 acid ethyl esters, Linzess, Ozempic (1 MG/DOSE), hydrALAZINE, omeprazole, simvastatin, carvedilol, and levocetirizine.  Meds ordered this encounter  Medications   HYDROcodone bit-homatropine (HYCODAN) 5-1.5 MG/5ML syrup    Sig: Take 5 mLs by mouth every 8 (eight) hours as needed for up to 7 days for cough.    Dispense:  120 mL    Refill:  0   methylPREDNISolone (MEDROL DOSEPAK) 4 MG TBPK tablet    Sig: TAKE AS DIRECTED    Dispense:  21 tablet    Refill:  0   budesonide-formoterol (SYMBICORT) 80-4.5 MCG/ACT inhaler    Sig: Inhale 2 puffs into the lungs 2 (two) times daily.    Dispense:  3 each    Refill:  0     Follow-up: Return in about 3 weeks (around 05/17/2021).  Scarlette Calico, MD

## 2021-04-26 NOTE — Patient Instructions (Signed)

## 2021-04-27 LAB — CBC WITH DIFFERENTIAL/PLATELET
Basophils Absolute: 0.1 10*3/uL (ref 0.0–0.1)
Basophils Relative: 1.4 % (ref 0.0–3.0)
Eosinophils Absolute: 0.2 10*3/uL (ref 0.0–0.7)
Eosinophils Relative: 2.5 % (ref 0.0–5.0)
HCT: 29.5 % — ABNORMAL LOW (ref 36.0–46.0)
Hemoglobin: 9.9 g/dL — ABNORMAL LOW (ref 12.0–15.0)
Lymphocytes Relative: 42.6 % (ref 12.0–46.0)
Lymphs Abs: 3.3 10*3/uL (ref 0.7–4.0)
MCHC: 33.7 g/dL (ref 30.0–36.0)
MCV: 89 fl (ref 78.0–100.0)
Monocytes Absolute: 0.6 10*3/uL (ref 0.1–1.0)
Monocytes Relative: 7.8 % (ref 3.0–12.0)
Neutro Abs: 3.6 10*3/uL (ref 1.4–7.7)
Neutrophils Relative %: 45.7 % (ref 43.0–77.0)
Platelets: 391 10*3/uL (ref 150.0–400.0)
RBC: 3.31 Mil/uL — ABNORMAL LOW (ref 3.87–5.11)
RDW: 14.1 % (ref 11.5–15.5)
WBC: 7.8 10*3/uL (ref 4.0–10.5)

## 2021-04-27 LAB — SARS-COV-2 IGG: SARS-COV-2 IgG: 18.8

## 2021-04-30 NOTE — Progress Notes (Signed)
[facility] PREP Weekly Session  Patient Details  Name: Carolyn [name] MRN: [id number] Date of Birth: Aug 18, 1960 Age: 61 y.o. PCP: [name], MD  Vitals:   04/30/21 0904  Weight: 173 lb 12.8 oz (78.8 kg)     [facility] Weekly seesion - 04/30/21 0900       [facility] "PREP" Location   [facility] "PREP" Location [facility]      Weekly Session   Topic Discussed Hitting roadblocks   [employer] membership talk; review of goals and activity plan for next 3 months   Minutes exercised this week 90 minutes    Classes attended to date [place] 04/30/2021, 9:05 AM

## 2021-05-03 ENCOUNTER — Ambulatory Visit (INDEPENDENT_AMBULATORY_CARE_PROVIDER_SITE_OTHER): Payer: No Typology Code available for payment source | Admitting: Pharmacist

## 2021-05-03 ENCOUNTER — Other Ambulatory Visit: Payer: Self-pay

## 2021-05-03 VITALS — BP 140/78 | HR 84 | Wt 176.0 lb

## 2021-05-03 DIAGNOSIS — R931 Abnormal findings on diagnostic imaging of heart and coronary circulation: Secondary | ICD-10-CM | POA: Diagnosis not present

## 2021-05-03 DIAGNOSIS — E781 Pure hyperglyceridemia: Secondary | ICD-10-CM

## 2021-05-03 DIAGNOSIS — E118 Type 2 diabetes mellitus with unspecified complications: Secondary | ICD-10-CM

## 2021-05-03 DIAGNOSIS — I10 Essential (primary) hypertension: Secondary | ICD-10-CM

## 2021-05-03 MED ORDER — OZEMPIC (2 MG/DOSE) 8 MG/3ML ~~LOC~~ SOPN: 2.0000 mg | PEN_INJECTOR | SUBCUTANEOUS | 11 refills | Status: DC

## 2021-05-03 NOTE — Progress Notes (Signed)
Patient ID: [name]                 DOB: Sep 25, 1960                      MRN: [id number]     HPI: Carolyn [name] is a 61 y.o. female referred by Dr. Johnsie Cancel to pharmacy clinic for HF medication management. PMH is significant for HTN, OSA, anemia, DM and recent diagnosis of DCM. Most recent LVEF 35 to 40% on 11/23/20.   At previous pharmD visits patient was slowly titrated off of clonidine and her CDMT was optimized. She was enrolled in the [facility] prep class. Her Rybelsus was changed to Ozempic. At last visit, her home blood pressures reported were at goal and now medication changes were made. She has been on SGLT2i in the past and had yeast infection. Is prone to yeast infections.    Patient presents today for follow up. She denies any current dizziness, lightheadedness, headache, blurred vision, SOB or swelling. She is recovering from a possible COVID infection. Has a lingering cough. Per patient her blood pressure has been about 124/80 at home. A little higher today in clinic. Has not taken her BP medications yet. She is concerned that she drinks a lot of water but does not go to the bathroom that often. Her weight has been stable at 171lb since Christmas.  Doing well on Ozempic 45m weekly. Ready to increase to 217mweekly. She takes the Ozempic on Friday nights. This helps her avoid any nausea.   [facility]. Has about 2 classes left and then plans to join the [employer] to continue to work out.   Current CHF meds: hydralazine 50 mg TID, isosorbide dinitrate 20 mg TID, spironolactone 25 mg daily, Entresto 97/103 mg BID, carvedilol 25 mg BID Other BP Meds: Previously tried: Invokana (yeast infection) BP goal: <130/80   Family History: The patient's family history includes Alzheimer's disease in her mother; Breast cancer in her sister; Cancer in her sister; Diabetes in her brother, sister, and sister; Emphysema in her father and sister; Hypertension in her mother; Thyroid  cancer in her sister. There is no history of Colon cancer.  Home BP: 124/80  Exercise: [employer] prep class twice a week, going to gym extra treadmill, leg presses, chest press  Diet: breakfast: apple Lunch: salad (oil and vinegar dressing)  Dinner: protein and green beans Snack: Tortilla chips with lime, carrot sticks  Wt Readings from Last 3 Encounters:  04/30/21 173 lb 12.8 oz (78.8 kg)  04/26/21 179 lb (81.2 kg)  04/23/21 172 lb 12.8 oz (78.4 kg)   BP Readings from Last 3 Encounters:  04/26/21 124/78  03/16/21 128/82  03/15/21 126/82   Pulse Readings from Last 3 Encounters:  04/26/21 94  03/16/21 85  03/15/21 85    Renal function: CrCl cannot be calculated (Patient's most recent lab result is older than the maximum 21 days allowed.).  Past Medical History:  Diagnosis Date   Allergy    seasonal   Anemia    Anemia of chronic disease suspected   Anxiety attack    Arthritis    Asthmatic bronchitis 04/26/2021   Benign fundic gland polyps of stomach    Dyslipidemia    Exertional shortness of breath    GERD (gastroesophageal reflux disease)    Heart murmur    Hyperlipidemia    Hypertension    Migraines    "probably weekly" (01/14/2013)  Type II diabetes mellitus ([place])     Current Outpatient Medications on File Prior to Visit  Medication Sig Dispense Refill   Blood Glucose Monitoring Suppl (ONETOUCH VERIO IQ SYSTEM) W/DEVICE KIT 1 Act by Does not apply route 3 (three) times daily. 2 kit 0   budesonide-formoterol (SYMBICORT) 80-4.5 MCG/ACT inhaler Inhale 2 puffs into the lungs 2 (two) times daily. 3 each 0   calcium carbonate (OS-CAL - DOSED IN MG OF ELEMENTAL CALCIUM) 1250 (500 Ca) MG tablet Take 2 tablets by mouth daily with breakfast.     carvedilol (COREG) 25 MG tablet Take 1 tablet (25 mg total) by mouth 2 (two) times daily. 180 tablet 3   cholecalciferol (VITAMIN D3) 25 MCG (1000 UNIT) tablet Take 2,000 Units by mouth daily.     clonazePAM (KLONOPIN) 1 MG tablet  Take 1 tablet (1 mg total) by mouth at bedtime. 90 tablet 1   EPINEPHrine 0.3 mg/0.3 mL IJ SOAJ injection INJECT 0.3 MLS INTO THE MUSCLE ONCE FOR 1 DOSE  0   fenofibrate (TRICOR) 145 MG tablet Take 1 tablet (145 mg total) by mouth daily. 30 tablet 5   Flaxseed, Linseed, (FLAX SEED OIL) 1000 MG CAPS Take by mouth.     glucose blood (ONE TOUCH ULTRA TEST) test strip Use to test blood sugar three times a week and prn if having symptoms of low blood sugar Dx: 250.92 90 day supply 100 each 11   hydrALAZINE (APRESOLINE) 50 MG tablet Take 1 tablet (50 mg total) by mouth 3 (three) times daily. 270 tablet 3   HYDROcodone bit-homatropine (HYCODAN) 5-1.5 MG/5ML syrup Take 5 mLs by mouth every 8 (eight) hours as needed for up to 7 days for cough. 120 mL 0   Insulin Pen Needle (BD PEN NEEDLE MICRO U/F) 32G X 6 MM MISC Inject 1 Units into the skin once a week. 100 each 3   iron polysaccharides (NIFEREX) 150 MG capsule Take 150 mg by mouth daily.     isosorbide dinitrate (ISORDIL) 20 MG tablet Take 1 tablet (20 mg total) by mouth 3 (three) times daily. 90 tablet 3   Lancets (ONETOUCH DELICA PLUS [id number]) MISC USE UP TO 4 TIMES DAILY AS DIRECTED  1   levocetirizine (XYZAL) 5 MG tablet Take 5 mg by mouth every evening.     LINZESS 145 MCG CAPS capsule TAKE 1 CAPSULE BY MOUTH ONCE DAILY BEFORE BREAKFAST 90 capsule 1   Multiple Vitamins-Minerals (HAIR SKIN AND NAILS FORMULA) TABS Take by mouth.     omega-3 acid ethyl esters (LOVAZA) 1 g capsule Take 2 capsules (2 g total) by mouth 2 (two) times daily. 60 capsule 5   omeprazole (PRILOSEC) 40 MG capsule TAKE 1 CAPSULE BY MOUTH TWICE DAILY AT  8AM  AND  10PM 180 capsule 1   sacubitril-valsartan (ENTRESTO) 97-103 MG Take 1 tablet by mouth 2 (two) times daily. 60 tablet 11   Semaglutide, 1 MG/DOSE, (OZEMPIC, 1 MG/DOSE,) 4 MG/3ML SOPN Inject 1 mg into the skin once a week. 3 mL 3   simvastatin (ZOCOR) 40 MG tablet TAKE 1 TABLET BY MOUTH AT BEDTIME 90 tablet 1    spironolactone (ALDACTONE) 25 MG tablet Take 1 tablet (25 mg total) by mouth daily. 90 tablet 3   zinc gluconate 50 MG tablet Take 100 mg by mouth daily.     No current facility-administered medications on file prior to visit.    Allergies  Allergen Reactions   Invokana [Canagliflozin] Other (See Comments)  YEAST INFECTIONS   Codeine Nausea And Vomiting   Flexeril [Cyclobenzaprine]     sedation   Morphine And Related Itching   Reglan [Metoclopramide] Other (See Comments)    "paralyzes me"   Sil[place]     In watch bands     Assessment/Plan:  1. CHF -  BP is above goal of <130/80 in clinic today. She has not taken her medications yet today. She reports her BP is at goal at home. I have asked her to bring her log with her to next appointment. No medications changes today. Continue hydralazine 50 mg TID, isosorbide dinitrate 20 mg TID, spironolactone 25 mg daily, Entresto 97/103 mg BID and carvedilol 45m BID. Follow up in 5 weeks per pt request.   2. DM / weight loss - Patient doing well on Ozempic 1 mg. Increase to Ozempic 255mweekly. [facility] and really enjoying [facility]. [facility] after classes are over. Hard to tell how much weight she has lost since she has been on some form on GLP-1 for several years. She has lost about 10lb since starting with me.  3. Hyperlipidemia- TG are much improved to 83. LDL-C 84. Continue simvastatin 4062mfenofibrate 145 and Lovaza 2g BID. Patient has a positive calcium score of 375 (98th percentile). I would consider being more aggressive with an LDL-C goal of <70 and ApoB <80. I will discuss with patient at her next visit. Would recommend switching to high intensity statin like rosuvastatin 20 or 41m14mily.  MeliRamond Dialarm.D, BCPS, CPP [id number] [id number]: ([phone]: [phone]

## 2021-05-03 NOTE — Patient Instructions (Addendum)
Continue hydralazine 50 mg three times a day, isosorbide dinitrate 20 mg three times a day, spironolactone 25 mg daily, Entresto 97/103 mg twice a day, carvedilol 25 mg twice a day

## 2021-05-06 ENCOUNTER — Other Ambulatory Visit: Payer: Self-pay | Admitting: Cardiovascular Disease

## 2021-05-07 NOTE — Progress Notes (Signed)
[facility] PREP Weekly Session  Patient Details  Name: Carolyn [name] MRN: [id number] Date of Birth: 12-06-1960 Age: 61 y.o. PCP: [name], MD  Vitals:   05/07/21 0847  Weight: 172 lb 6.4 oz (78.2 kg)     [facility] Weekly seesion - 05/07/21 0800       [facility] "PREP" Location   [facility] "PREP" Location [facility]      Weekly Session   Topic Discussed Other   Fit testing completed, how fit and strong are you now survey completed, final assessment visit scheduled for Wednesday, to compete goals and activity plan for next 90 days and PREP class survey.   Minutes exercised this week 90 minutes    Classes attended to date [place] 05/07/2021, 8:51 AM

## 2021-05-09 NOTE — Progress Notes (Signed)
[facility] PREP Evaluation  Patient Details  Name: Carolyn [name] MRN: [id number] Date of Birth: 12/20/1960 Age: 61 y.o. PCP: [name], MD  Vitals:   05/09/21 0913  BP: 120/70  Pulse: 95  SpO2: 96%  Weight: 173 lb (78.5 kg)     [facility] Eval - 05/09/21 0900       [facility] "PREP" Location   [facility] "PREP" Location [facility]      Referral    Referring Provider Nishan    Program Start Date 05/09/21   Program end date: 05/09/2021     Measurement   Waist Circumference 39 inches    Hip Circumference 39 inches    Body fat 35.8 percent      Mobility and Daily Activities   I find it easy to walk up or down two or more flights of stairs. 4    I have no trouble taking out the trash. 4    I do housework such as vacuuming and dusting on my own without difficulty. 4    I can easily lift a gallon of milk (8lbs). 4    I can easily walk a mile. 4    I have no trouble reaching into high cupboards or reaching down to pick up something from the floor. 4    I do not have trouble doing out-door work such as Armed forces logistics/support/administrative officer, raking leaves, or gardening. 3      Mobility and Daily Activities   I feel younger than my age. 4    I feel independent. 4    I feel energetic. 3    I live an active life.  3    I feel strong. 3    I feel healthy. 2    I feel active as other people my age. 3      How fit and strong are you.   Fit and Strong Total Score 49            Past Medical History:  Diagnosis Date   Allergy    seasonal   Anemia    Anemia of chronic disease suspected   Anxiety attack    Arthritis    Asthmatic bronchitis 04/26/2021   Benign fundic gland polyps of stomach    Dyslipidemia    Exertional shortness of breath    GERD (gastroesophageal reflux disease)    Heart murmur    Hyperlipidemia    Hypertension    Migraines    "probably weekly" (01/14/2013)   Type II diabetes mellitus ([place])    Past Surgical History:  Procedure Laterality Date   CHOLECYSTECTOMY  1990    COLONOSCOPY     ENDOMETRIAL ABLATION  ~ 2000-2002   "twice" (01/14/2013)   LEFT HEART CATHETERIZATION WITH CORONARY ANGIOGRAM N/A 01/15/2013   Procedure: LEFT HEART CATHETERIZATION WITH CORONARY ANGIOGRAM;  Surgeon: Blane Ohara, MD;  Location: [facility] CATH LAB;  Service: Cardiovascular;  Laterality: N/A;   LEFT OOPHORECTOMY Left ~ 2003   ORIF TIBIA & FIBULA FRACTURES Left 2003   VAGINAL HYSTERECTOMY  ~ 2003   Social History   Tobacco Use  Smoking Status Never  Smokeless Tobacco Never  Education sessions completed: 9 Workout sessions completed: 10 How fit and strong survey: 02/08/21: 47   05/09/21: 49 BP 10/27: 152/90 05/09/21: 120/70   B  05/09/2021, 9:15 AM
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
## 2021-05-17 ENCOUNTER — Ambulatory Visit: Payer: Self-pay

## 2021-05-17 ENCOUNTER — Other Ambulatory Visit: Payer: Self-pay

## 2021-05-17 ENCOUNTER — Encounter: Payer: Self-pay | Admitting: Family Medicine

## 2021-05-17 ENCOUNTER — Ambulatory Visit (INDEPENDENT_AMBULATORY_CARE_PROVIDER_SITE_OTHER): Payer: No Typology Code available for payment source | Admitting: Family Medicine

## 2021-05-17 VITALS — BP 120/80 | HR 85 | Ht 64.0 in | Wt 174.0 lb

## 2021-05-17 DIAGNOSIS — M25512 Pain in left shoulder: Secondary | ICD-10-CM

## 2021-05-17 DIAGNOSIS — M1711 Unilateral primary osteoarthritis, right knee: Secondary | ICD-10-CM | POA: Diagnosis not present

## 2021-05-17 DIAGNOSIS — G8929 Other chronic pain: Secondary | ICD-10-CM

## 2021-05-17 NOTE — Assessment & Plan Note (Signed)
Chronic problem with exacerbation.  Given another injection.  Could be candidate for possible viscosupplementation.  Discussed bracing and home exercises.  Patient is doing remarkable losing weight.  Continue this at the moment and follow-up with me again in 6 to 8 weeks

## 2021-05-17 NOTE — Patient Instructions (Addendum)
Injected knee today Ice 20 min 2x a day Will get gel approved for R knee See me in 6 weeks

## 2021-05-29 ENCOUNTER — Telehealth: Payer: Self-pay

## 2021-05-29 NOTE — Telephone Encounter (Signed)
FMLA fors received.  LVM for pt to discuss the reason for need. On the form it list "illness" for a date of absence on 05/14/21. Pt was seen by PCP on 1/12 for cough. CMA needs clarification on whats needed for form completion.

## 2021-05-29 NOTE — Telephone Encounter (Signed)
Pt has called back stating that she was still dealing with lingering cough on 1/30.  I have filled out forms and placed on PCPs desk for review and signature.   Pt has been informed that PCP is out of the office this week but I will fax the forms asap once signed.

## 2021-05-31 ENCOUNTER — Inpatient Hospital Stay (HOSPITAL_COMMUNITY)
Admission: EM | Admit: 2021-05-31 | Discharge: 2021-06-02 | DRG: 287 | Disposition: A | Discharge status: Home / Self Care | Payer: No Typology Code available for payment source | Attending: Cardiology | Admitting: Cardiology

## 2021-05-31 ENCOUNTER — Emergency Department (HOSPITAL_COMMUNITY): Payer: No Typology Code available for payment source

## 2021-05-31 ENCOUNTER — Encounter (HOSPITAL_COMMUNITY): Payer: Self-pay | Admitting: Emergency Medicine

## 2021-05-31 DIAGNOSIS — Z7951 Long term (current) use of inhaled steroids: Secondary | ICD-10-CM | POA: Diagnosis not present

## 2021-05-31 DIAGNOSIS — Z8249 Family history of ischemic heart disease and other diseases of the circulatory system: Secondary | ICD-10-CM

## 2021-05-31 DIAGNOSIS — Z7985 Long-term (current) use of injectable non-insulin antidiabetic drugs: Secondary | ICD-10-CM

## 2021-05-31 DIAGNOSIS — Z820 Family history of epilepsy and other diseases of the nervous system: Secondary | ICD-10-CM

## 2021-05-31 DIAGNOSIS — Z825 Family history of asthma and other chronic lower respiratory diseases: Secondary | ICD-10-CM

## 2021-05-31 DIAGNOSIS — I214 Non-ST elevation (NSTEMI) myocardial infarction: Secondary | ICD-10-CM | POA: Diagnosis present

## 2021-05-31 DIAGNOSIS — Z808 Family history of malignant neoplasm of other organs or systems: Secondary | ICD-10-CM

## 2021-05-31 DIAGNOSIS — F411 Generalized anxiety disorder: Secondary | ICD-10-CM

## 2021-05-31 DIAGNOSIS — I428 Other cardiomyopathies: Secondary | ICD-10-CM | POA: Diagnosis present

## 2021-05-31 DIAGNOSIS — Z79899 Other long term (current) drug therapy: Secondary | ICD-10-CM | POA: Diagnosis not present

## 2021-05-31 DIAGNOSIS — E118 Type 2 diabetes mellitus with unspecified complications: Secondary | ICD-10-CM | POA: Diagnosis not present

## 2021-05-31 DIAGNOSIS — Z833 Family history of diabetes mellitus: Secondary | ICD-10-CM

## 2021-05-31 DIAGNOSIS — Z803 Family history of malignant neoplasm of breast: Secondary | ICD-10-CM

## 2021-05-31 DIAGNOSIS — I5022 Chronic systolic (congestive) heart failure: Secondary | ICD-10-CM | POA: Diagnosis present

## 2021-05-31 DIAGNOSIS — Z87440 Personal history of urinary (tract) infections: Secondary | ICD-10-CM | POA: Diagnosis not present

## 2021-05-31 DIAGNOSIS — I110 Hypertensive heart disease with heart failure: Secondary | ICD-10-CM | POA: Diagnosis present

## 2021-05-31 DIAGNOSIS — Z20822 Contact with and (suspected) exposure to covid-19: Secondary | ICD-10-CM | POA: Diagnosis present

## 2021-05-31 DIAGNOSIS — E785 Hyperlipidemia, unspecified: Secondary | ICD-10-CM | POA: Diagnosis not present

## 2021-05-31 DIAGNOSIS — I10 Essential (primary) hypertension: Secondary | ICD-10-CM | POA: Diagnosis present

## 2021-05-31 DIAGNOSIS — E119 Type 2 diabetes mellitus without complications: Secondary | ICD-10-CM | POA: Diagnosis present

## 2021-05-31 DIAGNOSIS — F410 Panic disorder [episodic paroxysmal anxiety] without agoraphobia: Secondary | ICD-10-CM

## 2021-05-31 DIAGNOSIS — I5181 Takotsubo syndrome: Principal | ICD-10-CM | POA: Diagnosis present

## 2021-05-31 DIAGNOSIS — E7800 Pure hypercholesterolemia, unspecified: Secondary | ICD-10-CM | POA: Diagnosis present

## 2021-05-31 DIAGNOSIS — I420 Dilated cardiomyopathy: Secondary | ICD-10-CM | POA: Diagnosis not present

## 2021-05-31 DIAGNOSIS — R931 Abnormal findings on diagnostic imaging of heart and coronary circulation: Secondary | ICD-10-CM | POA: Diagnosis present

## 2021-05-31 LAB — CBC
HCT: 36.2 % (ref 36.0–46.0)
Hemoglobin: 12 g/dL (ref 12.0–15.0)
MCH: 30.3 pg (ref 26.0–34.0)
MCHC: 33.1 g/dL (ref 30.0–36.0)
MCV: 91.4 fL (ref 80.0–100.0)
Platelets: 435 10*3/uL — ABNORMAL HIGH (ref 150–400)
RBC: 3.96 MIL/uL (ref 3.87–5.11)
RDW: 12.8 % (ref 11.5–15.5)
WBC: 9.4 10*3/uL (ref 4.0–10.5)
nRBC: 0 % (ref 0.0–0.2)

## 2021-05-31 LAB — I-STAT BETA HCG BLOOD, ED (MC, WL, AP ONLY): I-stat hCG, quantitative: <5 m[IU]/mL (ref <5)

## 2021-05-31 LAB — RESP PANEL BY RT-PCR (FLU A&B, COVID) ARPGX2
Influenza A by PCR: NEGATIVE
Influenza B by PCR: NEGATIVE
SARS Coronavirus 2 by RT PCR: NEGATIVE

## 2021-05-31 LAB — BASIC METABOLIC PANEL
Anion gap: 11 (ref 5–15)
BUN: 18 mg/dL (ref 6–20)
CO2: 21 mmol/L — ABNORMAL LOW (ref 22–32)
Calcium: 10.2 mg/dL (ref 8.9–10.3)
Chloride: 106 mmol/L (ref 98–111)
Creatinine, Ser: 0.98 mg/dL (ref 0.44–1.00)
GFR, Estimated: >60 mL/min (ref >60)
Glucose, Bld: 88 mg/dL (ref 70–99)
Potassium: 3.8 mmol/L (ref 3.5–5.1)
Sodium: 138 mmol/L (ref 135–145)

## 2021-05-31 LAB — HEMOGLOBIN A1C
Hgb A1c MFr Bld: 5.6 % (ref 4.8–5.6)
Mean Plasma Glucose: 114.02 mg/dL

## 2021-05-31 LAB — HIV ANTIBODY (ROUTINE TESTING W REFLEX): HIV Screen 4th Generation wRfx: NONREACTIVE

## 2021-05-31 LAB — GLUCOSE, CAPILLARY: Glucose-Capillary: 152 mg/dL — ABNORMAL HIGH (ref 70–99)

## 2021-05-31 LAB — TROPONIN I (HIGH SENSITIVITY)
Troponin I (High Sensitivity): 53 ng/L — ABNORMAL HIGH (ref <18)
Troponin I (High Sensitivity): 133 ng/L (ref <18)
Troponin I (High Sensitivity): 279 ng/L (ref <18)

## 2021-05-31 MED ORDER — POLYSACCHARIDE IRON COMPLEX 150 MG PO CAPS
150.0000 mg | ORAL_CAPSULE | Freq: Every day | ORAL | Status: DC
  Administered 2021-06-01 – 2021-06-02 (×2): 150 mg via ORAL
  Filled 2021-05-31 (×2): qty 1

## 2021-05-31 MED ORDER — ASPIRIN 81 MG PO CHEW
81.0000 mg | CHEWABLE_TABLET | ORAL | Status: AC
  Administered 2021-06-01: 81 mg via ORAL
  Filled 2021-05-31: qty 1

## 2021-05-31 MED ORDER — ONDANSETRON HCL 4 MG/2ML IJ SOLN
4.0000 mg | Freq: Four times a day (QID) | INTRAMUSCULAR | Status: DC | PRN
  Filled 2021-05-31: qty 2

## 2021-05-31 MED ORDER — ASPIRIN 81 MG PO CHEW
324.0000 mg | CHEWABLE_TABLET | Freq: Once | ORAL | Status: AC
Start: 2021-05-31 — End: 2021-05-31
  Administered 2021-05-31: 324 mg via ORAL
  Filled 2021-05-31: qty 4

## 2021-05-31 MED ORDER — ALPRAZOLAM 0.25 MG PO TABS
0.2500 mg | ORAL_TABLET | Freq: Two times a day (BID) | ORAL | Status: DC | PRN
Start: 2021-05-31 — End: 2021-06-02

## 2021-05-31 MED ORDER — SPIRONOLACTONE 25 MG PO TABS
25.0000 mg | ORAL_TABLET | Freq: Every day | ORAL | Status: DC
  Administered 2021-06-01 – 2021-06-02 (×2): 25 mg via ORAL
  Filled 2021-05-31 (×2): qty 1

## 2021-05-31 MED ORDER — HYDRALAZINE HCL 50 MG PO TABS
50.0000 mg | ORAL_TABLET | Freq: Three times a day (TID) | ORAL | Status: DC
  Administered 2021-05-31 – 2021-06-02 (×4): 50 mg via ORAL
  Filled 2021-05-31 (×4): qty 1

## 2021-05-31 MED ORDER — LORATADINE 10 MG PO TABS
5.0000 mg | ORAL_TABLET | Freq: Every evening | ORAL | Status: DC
  Administered 2021-06-01: 5 mg via ORAL
  Filled 2021-05-31: qty 1

## 2021-05-31 MED ORDER — SACUBITRIL-VALSARTAN 97-103 MG PO TABS
1.0000 | ORAL_TABLET | Freq: Two times a day (BID) | ORAL | Status: DC
  Administered 2021-05-31 – 2021-06-02 (×4): 1 via ORAL
  Filled 2021-05-31 (×5): qty 1

## 2021-05-31 MED ORDER — ZOLPIDEM TARTRATE 5 MG PO TABS: 5.0000 mg | ORAL_TABLET | Freq: Every evening | ORAL | Status: DC | PRN

## 2021-05-31 MED ORDER — PANTOPRAZOLE SODIUM 40 MG PO TBEC
40.0000 mg | DELAYED_RELEASE_TABLET | Freq: Every day | ORAL | Status: DC
  Administered 2021-06-01 – 2021-06-02 (×2): 40 mg via ORAL
  Filled 2021-05-31 (×2): qty 1

## 2021-05-31 MED ORDER — LINACLOTIDE 145 MCG PO CAPS
145.0000 ug | ORAL_CAPSULE | Freq: Every day | ORAL | Status: DC
  Filled 2021-05-31 (×2): qty 1

## 2021-05-31 MED ORDER — CLONAZEPAM 0.5 MG PO TABS
1.0000 mg | ORAL_TABLET | Freq: Every day | ORAL | Status: DC
Start: 2021-06-01 — End: 2021-06-02
  Administered 2021-05-31: 1 mg via ORAL
  Filled 2021-05-31 (×2): qty 2

## 2021-05-31 MED ORDER — ASPIRIN 81 MG PO CHEW
81.0000 mg | CHEWABLE_TABLET | Freq: Once | ORAL | Status: AC
  Administered 2021-05-31: 81 mg via ORAL
  Filled 2021-05-31: qty 1

## 2021-05-31 MED ORDER — FENOFIBRATE 160 MG PO TABS
160.0000 mg | ORAL_TABLET | Freq: Every day | ORAL | Status: DC
  Administered 2021-06-01 – 2021-06-02 (×2): 160 mg via ORAL
  Filled 2021-05-31 (×2): qty 1

## 2021-05-31 MED ORDER — CARVEDILOL 25 MG PO TABS
25.0000 mg | ORAL_TABLET | Freq: Two times a day (BID) | ORAL | Status: DC
  Administered 2021-06-01 – 2021-06-02 (×3): 25 mg via ORAL
  Filled 2021-05-31 (×3): qty 1

## 2021-05-31 MED ORDER — ACETAMINOPHEN 325 MG PO TABS: 650.0000 mg | ORAL_TABLET | ORAL | Status: DC | PRN

## 2021-05-31 MED ORDER — ZINC SULFATE 220 (50 ZN) MG PO CAPS
220.0000 mg | ORAL_CAPSULE | Freq: Every day | ORAL | Status: DC
  Administered 2021-06-01 – 2021-06-02 (×2): 220 mg via ORAL
  Filled 2021-05-31 (×2): qty 1

## 2021-05-31 MED ORDER — OMEGA-3-ACID ETHYL ESTERS 1 G PO CAPS
2.0000 g | ORAL_CAPSULE | Freq: Two times a day (BID) | ORAL | Status: DC
  Administered 2021-05-31 – 2021-06-02 (×4): 2 g via ORAL
  Filled 2021-05-31 (×4): qty 2

## 2021-05-31 MED ORDER — HEPARIN (PORCINE) 25000 UT/250ML-% IV SOLN
1350.0000 [IU]/h | INTRAVENOUS | Status: DC
  Administered 2021-05-31: 900 [IU]/h via INTRAVENOUS
  Filled 2021-05-31: qty 250

## 2021-05-31 MED ORDER — SODIUM CHLORIDE 0.9 % WEIGHT BASED INFUSION: 1.0000 mL/kg/h | INTRAVENOUS | Status: DC

## 2021-05-31 MED ORDER — MOMETASONE FURO-FORMOTEROL FUM 100-5 MCG/ACT IN AERO
2.0000 | INHALATION_SPRAY | Freq: Two times a day (BID) | RESPIRATORY_TRACT | Status: DC
  Administered 2021-06-01: 2 via RESPIRATORY_TRACT
  Filled 2021-05-31 (×2): qty 8.8

## 2021-05-31 MED ORDER — SIMVASTATIN 20 MG PO TABS
40.0000 mg | ORAL_TABLET | Freq: Every day | ORAL | Status: DC
  Administered 2021-05-31 – 2021-06-01 (×2): 40 mg via ORAL
  Filled 2021-05-31 (×2): qty 2

## 2021-05-31 MED ORDER — SODIUM CHLORIDE 0.9% FLUSH
3.0000 mL | Freq: Two times a day (BID) | INTRAVENOUS | Status: DC
  Administered 2021-06-01: 3 mL via INTRAVENOUS

## 2021-05-31 MED ORDER — ISOSORBIDE DINITRATE 10 MG PO TABS
20.0000 mg | ORAL_TABLET | Freq: Three times a day (TID) | ORAL | Status: DC
  Administered 2021-05-31 – 2021-06-02 (×4): 20 mg via ORAL
  Filled 2021-05-31 (×4): qty 2

## 2021-05-31 MED ORDER — SODIUM CHLORIDE 0.9 % WEIGHT BASED INFUSION
3.0000 mL/kg/h | INTRAVENOUS | Status: AC
  Administered 2021-05-31: 3 mL/kg/h via INTRAVENOUS

## 2021-05-31 MED ORDER — CARVEDILOL 12.5 MG PO TABS
25.0000 mg | ORAL_TABLET | Freq: Two times a day (BID) | ORAL | Status: DC
Start: 2021-05-31 — End: 2021-05-31

## 2021-05-31 MED ORDER — HEPARIN BOLUS VIA INFUSION
4000.0000 [IU] | Freq: Once | INTRAVENOUS | Status: AC
  Administered 2021-05-31: 4000 [IU] via INTRAVENOUS
  Filled 2021-05-31: qty 4000

## 2021-05-31 MED ORDER — NITROGLYCERIN 0.4 MG SL SUBL
0.4000 mg | SUBLINGUAL_TABLET | SUBLINGUAL | Status: DC | PRN
Start: 2021-05-31 — End: 2021-06-02

## 2021-05-31 NOTE — ED Provider Notes (Signed)
[facility] EMERGENCY DEPARTMENT Provider Note   CSN: [id number] Arrival date & time: 05/31/21  1202     History  Chief Complaint  Patient presents with   Anxiety    Carolyn [name] is a 61 y.o. female.  61 yo F with a chief complaints of feeling like she is having a panic attack.  The patient tells me that she was at work and she was having a conversation with a coworker and suddenly felt unwell.  Says that she had tingling in her hands around her mouth and her teeth in her feet.  They called 911 and brought her here.  She states that last for about 10 or 15 minutes or so and then resolved.  She now feels completely normal.  She denies any chest pain or pressure with this denied any difficulty breathing.   Anxiety      Home Medications Prior to Admission medications   Medication Sig Start Date End Date Taking? Authorizing Provider  Blood Glucose Monitoring Suppl (ONETOUCH VERIO IQ SYSTEM) W/DEVICE KIT 1 Act by Does not apply route 3 (three) times daily. 04/06/14   [name], MD  budesonide-formoterol (SYMBICORT) 80-4.5 MCG/ACT inhaler Inhale 2 puffs into the lungs 2 (two) times daily. 04/26/21   [name], MD  calcium carbonate (OS-CAL - DOSED IN MG OF ELEMENTAL CALCIUM) 1250 (500 Ca) MG tablet Take 2 tablets by mouth daily with breakfast.    [provider]  carvedilol (COREG) 25 MG tablet Take 1 tablet (25 mg total) by mouth 2 (two) times daily. 02/22/21   Josue Hector, MD  cholecalciferol (VITAMIN D3) 25 MCG (1000 UNIT) tablet Take 2,000 Units by mouth daily.    [provider]  clonazePAM (KLONOPIN) 1 MG tablet Take 1 tablet (1 mg total) by mouth at bedtime. 01/11/20   [name], MD  EPINEPHrine 0.3 mg/0.3 mL IJ SOAJ injection INJECT 0.3 MLS INTO THE MUSCLE ONCE FOR 1 DOSE 11/03/17   [provider]  fenofibrate (TRICOR) 145 MG tablet Take 1 tablet (145 mg total) by mouth daily. 01/05/21   Josue Hector, MD   Flaxseed, Linseed, (FLAX SEED OIL) 1000 MG CAPS Take by mouth.    [provider]  glucose blood (ONE TOUCH ULTRA TEST) test strip Use to test blood sugar three times a week and prn if having symptoms of low blood sugar Dx: 250.92 90 day supply 05/28/13   Norins, Heinz Knuckles, MD  hydrALAZINE (APRESOLINE) 50 MG tablet Take 1 tablet (50 mg total) by mouth 3 (three) times daily. 01/15/21   Josue Hector, MD  Insulin Pen Needle (BD PEN NEEDLE MICRO U/F) 32G X 6 MM MISC Inject 1 Units into the skin once a week. 01/01/21   Josue Hector, MD  iron polysaccharides (NIFEREX) 150 MG capsule Take 150 mg by mouth daily.    [provider]  isosorbide dinitrate (ISORDIL) 20 MG tablet Take 1 tablet (20 mg total) by mouth 3 (three) times daily. 05/08/21   Josue Hector, MD  Lancets (ONETOUCH DELICA PLUS [id number]) [place] USE UP TO 4 TIMES DAILY AS DIRECTED 02/04/18   [provider]  levocetirizine (XYZAL) 5 MG tablet Take 5 mg by mouth every evening.    [provider]  LINZESS 145 MCG CAPS capsule TAKE 1 CAPSULE BY MOUTH ONCE DAILY BEFORE BREAKFAST 01/08/21   [name], MD  Multiple Vitamins-Minerals (HAIR SKIN AND NAILS FORMULA) TABS Take by  mouth.    [provider]  omega-3 acid ethyl esters (LOVAZA) 1 g capsule Take 2 capsules (2 g total) by mouth 2 (two) times daily. 01/05/21   Josue Hector, MD  omeprazole (PRILOSEC) 40 MG capsule TAKE 1 CAPSULE BY MOUTH TWICE DAILY AT  8AM  AND  10PM 01/30/21   [name], MD  sacubitril-valsartan (ENTRESTO) 97-103 MG Take 1 tablet by mouth 2 (two) times daily. 12/21/20   Josue Hector, MD  Semaglutide, 2 MG/DOSE, (OZEMPIC, 2 MG/DOSE,) 8 MG/3ML SOPN Inject 2 mg into the skin once a week. 05/03/21   Josue Hector, MD  simvastatin (ZOCOR) 40 MG tablet TAKE 1 TABLET BY MOUTH AT BEDTIME 02/11/21   [name], MD  spironolactone (ALDACTONE) 25 MG tablet Take 1 tablet (25 mg total) by mouth daily. 11/23/20   Josue Hector, MD  zinc gluconate 50 MG tablet Take 100 mg by mouth daily.    [provider]      Allergies    Invokana [canagliflozin], Codeine, Flexeril [cyclobenzaprine], Morphine and related, Reglan [metoclopramide], and Silicon    Review of Systems   Review of Systems  Physical Exam Updated Vital Signs BP 139/90 (BP Location: Right Arm)    Pulse 92    Temp 98.4 F (36.9 C) (Oral)    Resp 17    SpO2 98%  Physical Exam Vitals and nursing note reviewed.  Constitutional:      General: She is not in acute distress.    Appearance: She is well-developed. She is not diaphoretic.  HENT:     Head: Normocephalic and atraumatic.  Eyes:     Pupils: Pupils are equal, round, and reactive to light.  Cardiovascular:     Rate and Rhythm: Normal rate and regular rhythm.     Heart sounds: No murmur heard.   No friction rub. No gallop.  Pulmonary:     Effort: Pulmonary effort is normal.     Breath sounds: No wheezing or rales.  Abdominal:     General: There is no distension.     Palpations: Abdomen is soft.     Tenderness: There is no abdominal tenderness.  Musculoskeletal:        General: No tenderness.     Cervical back: Normal range of motion and neck supple.  Skin:    General: Skin is warm and dry.  Neurological:     Mental Status: She is alert and oriented to person, place, and time.  Psychiatric:        Behavior: Behavior normal.    ED Results / Procedures / Treatments   Labs (all labs ordered are listed, but only abnormal results are displayed) Labs Reviewed  BASIC METABOLIC PANEL - Abnormal; Notable for the following components:      Result Value   CO2 21 (*)    All other components within normal limits  CBC - Abnormal; Notable for the following components:   Platelets 435 (*)    All other components within normal limits  GLUCOSE, CAPILLARY - Abnormal; Notable for the following components:   Glucose-Capillary 152 (*)    All other components within normal limits   TROPONIN I (HIGH SENSITIVITY) - Abnormal; Notable for the following components:   Troponin I (High Sensitivity) 53 (*)    All other components within normal limits  TROPONIN I (HIGH SENSITIVITY) - Abnormal; Notable for the following components:   Troponin I (High Sensitivity) 133 (*)    All  other components within normal limits  RESP PANEL BY RT-PCR (FLU A&B, COVID) ARPGX2  HEPARIN LEVEL (UNFRACTIONATED)  CBC  HIV ANTIBODY (ROUTINE TESTING W REFLEX)  COMPREHENSIVE METABOLIC PANEL  TSH  HEMOGLOBIN A1C  LIPID PANEL  I-STAT BETA HCG BLOOD, ED ([facility], [facility], [facility] ONLY)  TROPONIN I (HIGH SENSITIVITY)  TROPONIN I (HIGH SENSITIVITY)    EKG EKG Interpretation  Date/Time:  Thursday May 31 2021 12:18:58 EST Ventricular Rate:  104 PR Interval:  178 QRS Duration: 78 QT Interval:  342 QTC Calculation: 449 R Axis:   22 Text Interpretation: Sinus tachycardia with occasional Premature ventricular complexes Right atrial enlargement Cannot rule out Anterior infarct , age undetermined Abnormal ECG When compared with ECG of 15-Jan-2013 05:39, PREVIOUS ECG IS PRESENT Since last tracing rate faster Otherwise no significant change Confirmed by Deno Etienne [phone]) on 05/31/2021 5:43:20 PM  Radiology DG Chest 2 View  Result Date: 05/31/2021 CLINICAL DATA:  Chest pain EXAM: CHEST - 2 VIEW COMPARISON:  Chest radiograph dated April 26, 2021 FINDINGS: The heart is mildly enlarged. Both lungs are clear. The visualized skeletal structures are unremarkable. IMPRESSION: No active cardiopulmonary disease. Electronically Signed   By: Keane Police D.O.   On: 05/31/2021 13:20    Procedures Procedures    Medications Ordered in ED Medications  heparin ADULT infusion 100 units/mL (25000 units/268m) (900 Units/hr Intravenous New Bag/Given 05/31/21 1905)  sodium chloride flush (NS) 0.9 % injection 3 mL (has no administration in time range)  aspirin chewable tablet 81 mg (has no administration in time range)   nitroGLYCERIN (NITROSTAT) SL tablet 0.4 mg (has no administration in time range)  acetaminophen (TYLENOL) tablet 650 mg (has no administration in time range)  ondansetron (ZOFRAN) injection 4 mg (has no administration in time range)  zolpidem (AMBIEN) tablet 5 mg (has no administration in time range)  ALPRAZolam (XANAX) tablet 0.25 mg (has no administration in time range)  aspirin chewable tablet 324 mg (324 mg Oral Given 05/31/21 1906)  heparin bolus via infusion 4,000 Units (4,000 Units Intravenous Bolus from Bag 05/31/21 1906)    ED Course/ Medical Decision Making/ A&P                           Medical Decision Making Amount and/or Complexity of Data Reviewed Labs: ordered.  Risk OTC drugs. Prescription drug management. Decision regarding hospitalization.   Patient is a 61y.o. female with a cc of a sensation like she was panicking.  This occurred while she was at work.  Occurred at rest.  Sounds like panic based on history with pathognomonic tingling around the mouth and in the teeth however the patient's initial troponin was in the 50s and repeat was 133. Give asa, start on heparin.  Discussed with Cards.    CRITICAL CARE Performed by: DCecilio Asper  Total critical care time: 35 minutes  Critical care time was exclusive of separately billable procedures and treating other patients.  Critical care was necessary to treat or prevent imminent or life-threatening deterioration.  Critical care was time spent personally by me on the following activities: development of treatment plan with patient and/or surrogate as well as nursing, discussions with consultants, evaluation of patient's response to treatment, examination of patient, obtaining history from patient or surrogate, ordering and performing treatments and interventions, ordering and review of laboratory studies, ordering and review of radiographic studies, pulse oximetry and re-evaluation of patient's  condition.  The patients results and  plan were reviewed and discussed.   Any x-rays performed were independently reviewed by myself.   Differential diagnosis were considered with the presenting HPI.  Medications  heparin ADULT infusion 100 units/mL (25000 units/26m) (900 Units/hr Intravenous New Bag/Given 05/31/21 1905)  sodium chloride flush (NS) 0.9 % injection 3 mL (has no administration in time range)  aspirin chewable tablet 81 mg (has no administration in time range)  nitroGLYCERIN (NITROSTAT) SL tablet 0.4 mg (has no administration in time range)  acetaminophen (TYLENOL) tablet 650 mg (has no administration in time range)  ondansetron (ZOFRAN) injection 4 mg (has no administration in time range)  zolpidem (AMBIEN) tablet 5 mg (has no administration in time range)  ALPRAZolam (XANAX) tablet 0.25 mg (has no administration in time range)  aspirin chewable tablet 324 mg (324 mg Oral Given 05/31/21 1906)  heparin bolus via infusion 4,000 Units (4,000 Units Intravenous Bolus from Bag 05/31/21 1906)    Vitals:   05/31/21 1557 05/31/21 1742 05/31/21 1830 05/31/21 2115  BP: (!) 153/98 (!) 156/99 (!) 157/96 139/90  Pulse: 95 94 89 92  Resp: '18 13 15 17  ' Temp: 98.4 F (36.9 C)   98.4 F (36.9 C)  TempSrc:    Oral  SpO2: 100% 100% 97% 98%    Final diagnoses:  NSTEMI (non-ST elevated myocardial infarction) ([facility]    Admission/ observation were discussed with the admitting physician, patient and/or family and they are comfortable with the plan.          Final Clinical Impression(s) / ED Diagnoses Final diagnoses:  NSTEMI (non-ST elevated myocardial infarction) ([facility]    Rx / DBruinOrders ED Discharge Orders     None         FDeno Etienne DO 05/31/21 2224

## 2021-05-31 NOTE — ED Triage Notes (Addendum)
Patient BIB [facility] with complaint of extremity tingling and chest pain that occurred after an episode of hyperventilation and anxiety when she received some bad news. Patient is alert, oriented, and in no apparent distress at this time.  BP 169/111, history of hypertension for which patient did not take her medications today

## 2021-05-31 NOTE — Progress Notes (Signed)
ANTICOAGULATION CONSULT NOTE - Initial Consult  Pharmacy Consult for heparin Indication: chest pain/ACS  Allergies  Allergen Reactions   Invokana [Canagliflozin] Other (See Comments)    YEAST INFECTIONS   Codeine Nausea And Vomiting   Flexeril [Cyclobenzaprine]     sedation   Morphine And Related Itching   Reglan [Metoclopramide] Other (See Comments)    "paralyzes me"   Silicon     In watch bands    Patient Measurements:   Heparin Dosing Weight: 71 kg   Vital Signs: Temp: 98.4 F (36.9 C) (02/16 1557) Temp Source: Oral (02/16 1345) BP: 156/99 (02/16 1742) Pulse Rate: 94 (02/16 1742)  Labs: Recent Labs    05/31/21 1309 05/31/21 1444  HGB 12.0  --   HCT 36.2  --   PLT 435*  --   CREATININE 0.98  --   TROPONINIHS 53* 133*    CrCl cannot be calculated (Unknown ideal weight.).   Medical History: Past Medical History:  Diagnosis Date   Allergy    seasonal   Anemia    Anemia of chronic disease suspected   Anxiety attack    Arthritis    Asthmatic bronchitis 04/26/2021   Benign fundic gland polyps of stomach    Dyslipidemia    Exertional shortness of breath    GERD (gastroesophageal reflux disease)    Heart murmur    Hyperlipidemia    Hypertension    Migraines    "probably weekly" (01/14/2013)   Type II diabetes mellitus ([facility])     Medications:  (Not in a hospital admission)   Assessment: 15 YOF who presented with chest pain to start IV heparin for ACS.   H/H wnl. Plt elevated.   Goal of Therapy:  Heparin level 0.3-0.7 units/ml Monitor platelets by anticoagulation protocol: Yes   Plan:  -Heparin 4000 units IV bolus followed by heparin infusion at 900 units/hr -F/u 6 hr HL -Monitor daily HL, CBC and s/s of bleeding   Albertina Parr, PharmD., BCCCP Clinical Pharmacist Please refer to [facility] for unit-specific pharmacist
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
## 2021-05-31 NOTE — ED Provider Triage Note (Signed)
Emergency Medicine Provider Triage Evaluation Note  Carolyn [name] , a 61 y.o. female  was evaluated in triage.  Pt complains of chest pain.  She states that earlier today she received some bad news and had an episode of feeling extremely anxious with hyperventilation and was unable to calm down.  She states that soon after that she developed left-sided chest pain that radiated into her arm with some associated shortness of breath.  She denies any history of ACS, states that she has a history of hypertension and has not taken her medication today.  Upon my evaluation, patient states that she is feeling some better however is still having some chest pain.  Review of Systems  Positive: Chest pain, shortness of breath Negative: Fevers, chills  Physical Exam  BP (!) 153/109 (BP Location: Left Arm)    Pulse (!) 103    Temp 98.6 F (37 C) (Oral)    Resp 19    SpO2 98%  Gen:   Awake, no distress   Resp:  Normal effort  MSK:   Moves extremities without difficulty  Other:    Medical Decision Making  Medically screening exam initiated at 12:52 PM.  Appropriate orders placed.  Carolyn [name] was informed that the remainder of the evaluation will be completed by another provider, this initial triage assessment does not replace that evaluation, and the importance of remaining in the ED until their evaluation is complete.     Bud Face, PA-C 05/31/21 1254

## 2021-06-01 ENCOUNTER — Other Ambulatory Visit: Payer: Self-pay

## 2021-06-01 ENCOUNTER — Encounter (HOSPITAL_COMMUNITY)
Admission: EM | Disposition: A | Discharge status: Home / Self Care | Payer: Self-pay | Source: Home / Self Care | Attending: Cardiology

## 2021-06-01 ENCOUNTER — Inpatient Hospital Stay (HOSPITAL_COMMUNITY): Payer: No Typology Code available for payment source

## 2021-06-01 DIAGNOSIS — E118 Type 2 diabetes mellitus with unspecified complications: Secondary | ICD-10-CM

## 2021-06-01 DIAGNOSIS — I420 Dilated cardiomyopathy: Secondary | ICD-10-CM

## 2021-06-01 DIAGNOSIS — I5181 Takotsubo syndrome: Principal | ICD-10-CM

## 2021-06-01 DIAGNOSIS — E785 Hyperlipidemia, unspecified: Secondary | ICD-10-CM

## 2021-06-01 DIAGNOSIS — I214 Non-ST elevation (NSTEMI) myocardial infarction: Secondary | ICD-10-CM

## 2021-06-01 DIAGNOSIS — I10 Essential (primary) hypertension: Secondary | ICD-10-CM

## 2021-06-01 HISTORY — PX: LEFT HEART CATH AND CORONARY ANGIOGRAPHY: CATH118249

## 2021-06-01 LAB — COMPREHENSIVE METABOLIC PANEL
ALT: 16 U/L (ref 0–44)
AST: 17 U/L (ref 15–41)
Albumin: 3.8 g/dL (ref 3.5–5.0)
Alkaline Phosphatase: 31 U/L — ABNORMAL LOW (ref 38–126)
Anion gap: 9 (ref 5–15)
BUN: 18 mg/dL (ref 6–20)
CO2: 21 mmol/L — ABNORMAL LOW (ref 22–32)
Calcium: 9.3 mg/dL (ref 8.9–10.3)
Chloride: 108 mmol/L (ref 98–111)
Creatinine, Ser: 0.92 mg/dL (ref 0.44–1.00)
GFR, Estimated: >60 mL/min (ref >60)
Glucose, Bld: 99 mg/dL (ref 70–99)
Potassium: 3.3 mmol/L — ABNORMAL LOW (ref 3.5–5.1)
Sodium: 138 mmol/L (ref 135–145)
Total Bilirubin: 0.5 mg/dL (ref 0.3–1.2)
Total Protein: 6.5 g/dL (ref 6.5–8.1)

## 2021-06-01 LAB — LIPID PANEL
Cholesterol: 148 mg/dL (ref 0–200)
HDL: 42 mg/dL (ref >40)
LDL Cholesterol: 78 mg/dL (ref 0–99)
Total CHOL/HDL Ratio: 3.5 RATIO
Triglycerides: 139 mg/dL (ref <150)
VLDL: 28 mg/dL (ref 0–40)

## 2021-06-01 LAB — CBC
HCT: 32.5 % — ABNORMAL LOW (ref 36.0–46.0)
Hemoglobin: 10.9 g/dL — ABNORMAL LOW (ref 12.0–15.0)
MCH: 30.6 pg (ref 26.0–34.0)
MCHC: 33.5 g/dL (ref 30.0–36.0)
MCV: 91.3 fL (ref 80.0–100.0)
Platelets: 348 10*3/uL (ref 150–400)
RBC: 3.56 MIL/uL — ABNORMAL LOW (ref 3.87–5.11)
RDW: 12.8 % (ref 11.5–15.5)
WBC: 9.2 10*3/uL (ref 4.0–10.5)
nRBC: 0 % (ref 0.0–0.2)

## 2021-06-01 LAB — HEPARIN LEVEL (UNFRACTIONATED)
Heparin Unfractionated: <0.1 IU/mL — ABNORMAL LOW (ref 0.30–0.70)
Heparin Unfractionated: 0.11 IU/mL — ABNORMAL LOW (ref 0.30–0.70)

## 2021-06-01 LAB — ECHOCARDIOGRAM COMPLETE
AV Peak grad: 9.3 mmHg
Ao pk vel: 1.53 m/s
MV M vel: 5.24 m/s
MV Peak grad: 109.8 mmHg
S' Lateral: 5.3 cm
Single Plane A4C EF: 22.5 %
Weight: 2786.61 oz

## 2021-06-01 LAB — TSH: TSH: 1.818 u[IU]/mL (ref 0.350–4.500)

## 2021-06-01 SURGERY — LEFT HEART CATH AND CORONARY ANGIOGRAPHY
Anesthesia: LOCAL

## 2021-06-01 MED ORDER — EPINEPHRINE 0.3 MG/0.3ML IJ SOAJ: 0.3000 mg | INTRAMUSCULAR | 0 refills | Status: DC | PRN

## 2021-06-01 MED ORDER — VERAPAMIL HCL 2.5 MG/ML IV SOLN
INTRAVENOUS | Status: DC | PRN
  Administered 2021-06-01: 10 mL via INTRA_ARTERIAL

## 2021-06-01 MED ORDER — HEPARIN BOLUS VIA INFUSION
2000.0000 [IU] | Freq: Once | INTRAVENOUS | Status: AC
  Administered 2021-06-01: 2000 [IU] via INTRAVENOUS
  Filled 2021-06-01: qty 2000

## 2021-06-01 MED ORDER — FENTANYL CITRATE (PF) 100 MCG/2ML IJ SOLN
INTRAMUSCULAR | Status: AC
  Filled 2021-06-01: qty 2

## 2021-06-01 MED ORDER — ONDANSETRON HCL 4 MG/2ML IJ SOLN
4.0000 mg | Freq: Four times a day (QID) | INTRAMUSCULAR | Status: DC | PRN
Start: 2021-06-01 — End: 2021-06-02
  Administered 2021-06-01: 4 mg via INTRAVENOUS

## 2021-06-01 MED ORDER — HYDRALAZINE HCL 20 MG/ML IJ SOLN: 10.0000 mg | INTRAMUSCULAR | Status: AC | PRN

## 2021-06-01 MED ORDER — SODIUM CHLORIDE 0.9 % IV SOLN: 250.0000 mL | INTRAVENOUS | Status: DC | PRN

## 2021-06-01 MED ORDER — ASPIRIN 81 MG PO CHEW
81.0000 mg | CHEWABLE_TABLET | Freq: Every day | ORAL | Status: DC
  Administered 2021-06-02: 81 mg via ORAL
  Filled 2021-06-01: qty 1

## 2021-06-01 MED ORDER — LIDOCAINE HCL (PF) 1 % IJ SOLN
INTRAMUSCULAR | Status: AC
  Filled 2021-06-01: qty 30

## 2021-06-01 MED ORDER — ACETAMINOPHEN 325 MG PO TABS: 650.0000 mg | ORAL_TABLET | ORAL | Status: DC | PRN

## 2021-06-01 MED ORDER — SODIUM CHLORIDE 0.9% FLUSH: 3.0000 mL | INTRAVENOUS | Status: DC | PRN

## 2021-06-01 MED ORDER — LABETALOL HCL 5 MG/ML IV SOLN: 10.0000 mg | INTRAVENOUS | Status: AC | PRN

## 2021-06-01 MED ORDER — HEPARIN (PORCINE) IN NACL 1000-0.9 UT/500ML-% IV SOLN
INTRAVENOUS | Status: DC | PRN
  Administered 2021-06-01 (×2): 500 mL

## 2021-06-01 MED ORDER — HEPARIN SODIUM (PORCINE) 1000 UNIT/ML IJ SOLN
INTRAMUSCULAR | Status: DC | PRN
  Administered 2021-06-01: 4000 [IU] via INTRAVENOUS

## 2021-06-01 MED ORDER — LIDOCAINE HCL (PF) 1 % IJ SOLN
INTRAMUSCULAR | Status: DC | PRN
  Administered 2021-06-01: 2 mL

## 2021-06-01 MED ORDER — VERAPAMIL HCL 2.5 MG/ML IV SOLN
INTRAVENOUS | Status: AC
  Filled 2021-06-01: qty 2

## 2021-06-01 MED ORDER — CLONAZEPAM 1 MG PO TABS: 1.0000 mg | ORAL_TABLET | Freq: Every day | ORAL | 1 refills | Status: DC

## 2021-06-01 MED ORDER — FENTANYL CITRATE (PF) 100 MCG/2ML IJ SOLN
INTRAMUSCULAR | Status: DC | PRN
  Administered 2021-06-01: 25 ug via INTRAVENOUS

## 2021-06-01 MED ORDER — SODIUM CHLORIDE 0.9 % IV SOLN: INTRAVENOUS | Status: AC

## 2021-06-01 MED ORDER — SODIUM CHLORIDE 0.9% FLUSH
3.0000 mL | Freq: Two times a day (BID) | INTRAVENOUS | Status: DC
  Administered 2021-06-02: 3 mL via INTRAVENOUS

## 2021-06-01 MED ORDER — HEPARIN (PORCINE) IN NACL 1000-0.9 UT/500ML-% IV SOLN
INTRAVENOUS | Status: AC
  Filled 2021-06-01: qty 1000

## 2021-06-01 MED ORDER — MIDAZOLAM HCL 2 MG/2ML IJ SOLN
INTRAMUSCULAR | Status: DC | PRN
Start: 2021-06-01 — End: 2021-06-01
  Administered 2021-06-01: 1 mg via INTRAVENOUS

## 2021-06-01 MED ORDER — IOHEXOL 350 MG/ML SOLN
INTRAVENOUS | Status: DC | PRN
  Administered 2021-06-01: 65 mL

## 2021-06-01 MED ORDER — MIDAZOLAM HCL 2 MG/2ML IJ SOLN
INTRAMUSCULAR | Status: AC
  Filled 2021-06-01: qty 2

## 2021-06-01 MED ORDER — HEPARIN SODIUM (PORCINE) 5000 UNIT/ML IJ SOLN
5000.0000 [IU] | Freq: Three times a day (TID) | INTRAMUSCULAR | Status: DC
  Administered 2021-06-01 – 2021-06-02 (×2): 5000 [IU] via SUBCUTANEOUS
  Filled 2021-06-01 (×2): qty 1

## 2021-06-01 MED ORDER — HEPARIN SODIUM (PORCINE) 1000 UNIT/ML IJ SOLN
INTRAMUSCULAR | Status: AC
  Filled 2021-06-01: qty 10

## 2021-06-01 SURGICAL SUPPLY — 10 items
CATH 5FR JL3.5 JR4 ANG PIG MP (CATHETERS) ×1 IMPLANT
DEVICE RAD COMP TR BAND LRG (VASCULAR PRODUCTS) ×1 IMPLANT
GLIDESHEATH SLEND A-KIT 6F 22G (SHEATH) ×2 IMPLANT
GUIDEWIRE INQWIRE 1.5J.035X260 (WIRE) IMPLANT
INQWIRE 1.5J .035X260CM (WIRE) ×2
KIT HEART LEFT (KITS) ×2 IMPLANT
PACK CARDIAC CATHETERIZATION (CUSTOM PROCEDURE TRAY) ×2 IMPLANT
SHEATH PROBE COVER 6X72 (BAG) ×1 IMPLANT
TRANSDUCER W/STOPCOCK (MISCELLANEOUS) ×2 IMPLANT
TUBING CIL FLEX 10 FLL-RA (TUBING) ×2 IMPLANT

## 2021-06-01 NOTE — Progress Notes (Signed)
[place] for heparin Indication: chest pain/ACS  Allergies  Allergen Reactions   Invokana [Canagliflozin] Other (See Comments)    YEAST INFECTIONS   Codeine Nausea And Vomiting   Flexeril [Cyclobenzaprine]     sedation   Morphine And Related Itching   Reglan [Metoclopramide] Other (See Comments)    "paralyzes me"   Silicon     In watch bands    Patient Measurements: Weight: 79 kg (174 lb 2.6 oz) Heparin Dosing Weight: 71 kg   Vital Signs: Temp: 98.2 F (36.8 C) (02/17 0718) Temp Source: Oral (02/17 0718) BP: 142/92 (02/17 0718) Pulse Rate: 89 (02/17 0718)  Labs: Recent Labs    05/31/21 1309 05/31/21 1444 05/31/21 2226 06/01/21 0131 06/01/21 0927  HGB 12.0  --   --  10.9*  --   HCT 36.2  --   --  32.5*  --   PLT 435*  --   --  348  --   HEPARINUNFRC  --   --   --  <0.10* 0.11*  CREATININE 0.98  --   --  0.92  --   TROPONINIHS 53* 133* 279*  --   --      Estimated Creatinine Clearance: 66.9 mL/min (by C-G formula based on SCr of 0.92 mg/dL).    Assessment: 89 YOF who presented with chest pain to start IV heparin for ACS. Pt not on AC PTA.  Heparin level remains subtherapeutic at 0.11, cath planned in a few hours so will defer bolus.  Goal of Therapy:  Heparin level 0.3-0.7 units/ml Monitor platelets by anticoagulation protocol: Yes   Plan:  Increase heparin to 1350 units/h Follow-up after cath  Arrie Senate, PharmD, BCPS, [facility] Clinical Pharmacist [phone] Please check AMION for all [facility] Pharmacy numbers 06/01/2021

## 2021-06-01 NOTE — Progress Notes (Signed)
[place] for heparin Indication: chest pain/ACS  Allergies  Allergen Reactions   Invokana [Canagliflozin] Other (See Comments)    YEAST INFECTIONS   Codeine Nausea And Vomiting   Flexeril [Cyclobenzaprine]     sedation   Morphine And Related Itching   Reglan [Metoclopramide] Other (See Comments)    "paralyzes me"   Silicon     In watch bands    Patient Measurements: Weight: 79 kg (174 lb 2.6 oz) Heparin Dosing Weight: 71 kg   Vital Signs: Temp: 98.4 F (36.9 C) (02/16 2115) Temp Source: Oral (02/16 2115) BP: 139/90 (02/16 2115) Pulse Rate: 92 (02/16 2115)  Labs: Recent Labs    05/31/21 1309 05/31/21 1444 05/31/21 2226 06/01/21 0131  HGB 12.0  --   --  10.9*  HCT 36.2  --   --  32.5*  PLT 435*  --   --  348  HEPARINUNFRC  --   --   --  <0.10*  CREATININE 0.98  --   --   --   TROPONINIHS 53* 133* 279*  --      Estimated Creatinine Clearance: 62.8 mL/min (by C-G formula based on SCr of 0.98 mg/dL).    Assessment: 45 YOF who presented with chest pain to start IV heparin for ACS.   H/H wnl. Plt elevated.  Initial heparin level < 0.10  Goal of Therapy:  Heparin level 0.3-0.7 units/ml Monitor platelets by anticoagulation protocol: Yes   Plan:  Heparin 2000 units iv bolus 1 Increase heparin drip to 1100 units /hr Heparin level in 6 hours  Thank you Anette Guarneri, PharmD   Please refer to [facility] for unit-specific pharmacist

## 2021-06-01 NOTE — Interval H&P Note (Signed)
Cath Lab Visit (complete for each Cath Lab visit)  Clinical Evaluation Leading to the Procedure:   ACS: Yes.    Non-ACS:    Anginal Classification: CCS III  Anti-ischemic medical therapy: Minimal Therapy (1 class of medications)  Non-Invasive Test Results: No non-invasive testing performed  Prior CABG: No previous CABG      History and Physical Interval Note:  06/01/2021 12:31 PM  Carolyn [name]  has presented today for surgery, with the diagnosis of NSTEMI.  The various methods of treatment have been discussed with the patient and family. After consideration of risks, benefits and other options for treatment, the patient has consented to  Procedure(s): LEFT HEART CATH AND CORONARY ANGIOGRAPHY (N/A) as a surgical intervention.  The patient's history has been reviewed, patient examined, no change in status, stable for surgery.  I have reviewed the patient's chart and labs.  Questions were answered to the patient's satisfaction.     Belva Crome III

## 2021-06-01 NOTE — Discharge Summary (Addendum)
Discharge Summary    Patient ID: Carolyn [name] MRN: [id number]; DOB: [birth year]  Admit date: 05/31/2021 Discharge date: 06/02/2021  PCP:  [name], MD   [facility] Providers Cardiologist:  Jenkins Rouge, MD     Discharge Diagnoses    Principal Problem:   NSTEMI (non-ST elevated myocardial infarction) [facility]) Active Problems:   Essential hypertension   Hyperlipidemia with target LDL less than 100   Decreased cardiac ejection fraction   Type 2 diabetes mellitus with complication, without long-term current use of insulin (HCC)   Acute systolic CHF  Diagnostic Studies/Procedures    Left Heart Cath 06/01/2021     There is mild to moderate left ventricular systolic dysfunction.   LV end diastolic pressure is normal.   The left ventricular ejection fraction is 35-45% by visual estimate.   CONCLUSIONS: Decreased LV function with wall motion abnormality and estimated EF 30 to 40%.  LVEDP is 14 mmHg. Right dominant coronary anatomy Normal coronary arteries   RECOMMENDATIONS:   Elevated cardiac markers of uncertain etiology.  Possible demand ischemia related to significant blood pressure elevation in the setting of cardiomyopathy.  Rule out stress cardiomyopathy of atypical variety. Diagnostic Dominance: Right   Echo 06/01/21 1. Left ventricular ejection fraction, by estimation, is 25 to 30%. The  left ventricle has severely decreased function. The left ventricle  demonstrates global hypokinesis. The left ventricular internal cavity size  was mildly dilated. There is mild  eccentric left ventricular hypertrophy. Left ventricular diastolic  parameters are consistent with Grade I diastolic dysfunction (impaired  relaxation). There is severe hypokinesis of the left ventricular,  mid-apical inferolateral wall, inferior wall and  inferoseptal wall. There is moderate global hypokinesis, with  disproportionately severe hypokinesis in the mid-distal inferior wall and   neighboring inferoseptal and inferolateral wall segments. The regional  distribution is not entirely typical for usual  coronary distribution, but favors right coronary artery stenosis.   2. Right ventricular systolic function is normal. The right ventricular  size is normal. Tricuspid regurgitation signal is inadequate for assessing  PA pressure.   3. Left atrial size was mild to moderately dilated.   4. The mitral valve is normal in structure. Mild mitral valve  regurgitation.   5. The aortic valve is tricuspid. There is mild calcification of the  aortic valve. There is mild thickening of the aortic valve. Aortic valve  regurgitation is not visualized. Aortic valve sclerosis is present, with  no evidence of aortic valve stenosis.   6. The inferior vena cava is normal in size with greater than 50%  respiratory variability, suggesting right atrial pressure of 3 mmHg.   Comparison(s): Prior images reviewed side by side. The left ventricular  function is worsened. The left ventricular wall motion abnormality is new.   History of Present Illness     Carolyn [name] is a 61 y.o. 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  She was seen by our pharmacy team in January 2023 during which time she appeared to be doing well from a cardiovascular standpoint. She was continued on hydralazine 50 mg 3 times daily, isosorbide dinitrate 20 mg 3 times daily, spironolactone 25 mg daily, Entresto 97-103 twice daily, carvedilol 25 mg twice daily she was also doing well on her Ozempic for both for diabetes and weight loss. No medication changes were made at that time.  She was doing well over the last several months, and on 2/15 she went to the [facility] to exercise and did very well. She noted  on 2/16 that  she started to experience a slow onset of left-sided chest discomfort.  She described the discomfort as a burning/tingling sensation which then moved towards the left side under her breast.  She did not have any shortness of breath at the time.  But she did admit to having some tingling around her hands and mouth.  This sensation lasted about 15 minutes prior to resolution.  Her friend called EMS who brought her into the ED.   She did well in the ED. She had 1 episode of chest discomfort, but not as profound as her episode at home.  Her work-up in the ED showed her troponin trending upwards 53 > 133 and she was started on heparin drip.   Hospital Course     Consultants: None    CAD:  - Suspicious for angina pectoris.  Mild increase in cardiac enzymes.  Echo with apparent new regional wall motion abnormalities and drop in LVEF. Underwent Left Heart Cath on 2/17 that showed clean coronary arteries. - Continue ASA, statin and BB  Chronic systolic CHF/NICM with possible stress cardiomyopathy:  - Appears well compensated clinically and LVEDP only 12 mmHg, despite recent drop in EF.   -Only logical explanation is an episode of stress cardiomyopathy.  The trigger is uncertain, but she had been bench pressing 140 pounds at the gym, more than ever before.  - She is on comprehensive medical therapy with maximum dose of Entresto and carvedilol. Continue spironolactone and hydralazine/nitrates.  Not on SGLT2 inhibitor due to history of urinary tract infections with this agent.   - She appears clinically euvolemic.   Discharge weight 171lb.  - Anticipate recovery of LV systolic function back to her previous baseline; repeat echo after several weeks.  HTN:  - Controlled on current medications.   HLP:  - Continue simvastatin - 06/01/2021: Cholesterol 148; HDL 42; LDL Cholesterol 78; Triglycerides 139; VLDL 28   DM:  - Excellent control hemoglobin A1c 5.6%.Taking Ozempic for her diabetes and she is intent  on becoming fitter and losing weight.    Recommend avoiding lifting weights altogether at the gym for 2 or 3 weeks and avoid bench pressing or lifting extremely high weights indefinitely (prefer weight limit that allows her to perform 10-12 reps while still breathing, no straining).   Pay particular attention to sodium restriction and daily weight monitoring over the next couple of weeks.    Did the patient have an acute coronary syndrome (MI, NSTEMI, STEMI, etc) this admission?:  No                               Did the patient have a percutaneous coronary intervention (stent / angioplasty)?:  No.    Patient was seen and examined by Dr. Lovena Le . Deemed stable for discharge.   Arranged outpatient follow up with Ermalinda Barrios, PA-C on 06/12/2021.   Discharge Vitals Blood pressure 139/85, pulse 91, temperature 98.3 F (36.8 C), temperature source Oral, resp. rate 19, weight 77.7 kg, SpO2 98 %.  Filed Weights   05/31/21 2300 06/01/21 0400 06/02/21 0423  Weight: 79 kg 79 kg 77.7 kg   Physical Exam Constitutional:      Appearance: Normal appearance. She is obese.  HENT:     Head: Normocephalic and atraumatic.  Eyes:     Extraocular Movements: Extraocular movements intact.     Pupils: Pupils are equal, round, and reactive to light.  Cardiovascular:     Rate and Rhythm: Normal rate and regular rhythm.     Comments: Right radial cath site without hematoma  Pulmonary:     Effort: Pulmonary effort is normal.     Breath sounds: Normal breath sounds.  Abdominal:     General: Abdomen is flat. Bowel sounds are normal.     Palpations: Abdomen is soft.  Musculoskeletal:        General: Normal range of motion.     Cervical back: Normal range of motion and neck supple.  Skin:    General: Skin is warm and dry.  Neurological:     General: No focal deficit present.     Mental Status: She is alert and oriented to person, place, and time.  Psychiatric:        Mood and Affect: Mood normal.         Behavior: Behavior normal.    Labs & Radiologic Studies    CBC Recent Labs    06/01/21 0131 06/02/21 0434  WBC 9.2 6.9  HGB 10.9* 10.2*  HCT 32.5* 31.5*  MCV 91.3 92.4  PLT 348 983   Basic Metabolic Panel Recent Labs    05/31/21 1309 06/01/21 0131  NA 138 138  K 3.8 3.3*  CL 106 108  CO2 21* 21*  GLUCOSE 88 99  BUN 18 18  CREATININE 0.98 0.92  CALCIUM 10.2 9.3   Liver Function Tests Recent Labs    06/01/21 0131  AST 17  ALT 16  ALKPHOS 31*  BILITOT 0.5  PROT 6.5  ALBUMIN 3.8    High Sensitivity Troponin:   Recent Labs  Lab 05/31/21 1309 05/31/21 1444 05/31/21 2226  TROPONINIHS 53* 133* 279*     Hemoglobin A1C Recent Labs    05/31/21 2226  HGBA1C 5.6   Fasting Lipid Panel Recent Labs    06/01/21 0131  CHOL 148  HDL 42  LDLCALC 78  TRIG 139  CHOLHDL 3.5   Thyroid Function Tests Recent Labs    05/31/21 2226  TSH 1.818   _____________  DG Chest 2 View  Result Date: 05/31/2021 CLINICAL DATA:  Chest pain EXAM: CHEST - 2 VIEW COMPARISON:  Chest radiograph dated April 26, 2021 FINDINGS: The heart is mildly enlarged. Both lungs are clear. The visualized skeletal structures are unremarkable. IMPRESSION: No active cardiopulmonary disease. Electronically Signed   By: Keane Police D.O.   On: 05/31/2021 13:20   CARDIAC CATHETERIZATION  Result Date: 06/01/2021   There is mild to moderate left ventricular systolic dysfunction.   LV end diastolic pressure is normal.   The left ventricular ejection fraction is 35-45% by visual estimate. CONCLUSIONS: Decreased LV function with wall motion abnormality and estimated EF 30 to 40%.  LVEDP is 14 mmHg. Right dominant coronary anatomy Normal coronary arteries RECOMMENDATIONS: Elevated cardiac markers of uncertain etiology.  Possible demand ischemia related to significant blood pressure elevation in the setting of cardiomyopathy.  Rule out stress cardiomyopathy of atypical variety.   ECHOCARDIOGRAM  COMPLETE  Result Date: 06/01/2021    ECHOCARDIOGRAM REPORT   Patient Name:   Carolyn [name] Date of Exam: 06/01/2021 Medical Rec #:  [id number]       Height:       64.0 in Accession #:    [id number]      Weight:  174.2 lb Date of Birth:  [birth year]       BSA:          1.845 m Patient Age:    54 years        BP:           142/92 mmHg Patient Gender: F               HR:           83 bpm. Exam Location:  Inpatient Procedure: 2D Echo Indications:    Nstemi  History:        Patient has prior history of Echocardiogram examinations, most                 recent 11/23/2020. Risk Factors:Diabetes and Hypertension.  Sonographer:    Jefferey Pica Referring Phys: [id number] [place]  1. Left ventricular ejection fraction, by estimation, is 25 to 30%. The left ventricle has severely decreased function. The left ventricle demonstrates global hypokinesis. The left ventricular internal cavity size was mildly dilated. There is mild eccentric left ventricular hypertrophy. Left ventricular diastolic parameters are consistent with Grade I diastolic dysfunction (impaired relaxation). There is severe hypokinesis of the left ventricular, mid-apical inferolateral wall, inferior wall and inferoseptal wall. There is moderate global hypokinesis, with disproportionately severe hypokinesis in the mid-distal inferior wall and neighboring inferoseptal and inferolateral wall segments. The regional distribution is not entirely typical for usual coronary distribution, but favors right coronary artery stenosis.  2. Right ventricular systolic function is normal. The right ventricular size is normal. Tricuspid regurgitation signal is inadequate for assessing PA pressure.  3. Left atrial size was mild to moderately dilated.  4. The mitral valve is normal in structure. Mild mitral valve regurgitation.  5. The aortic valve is tricuspid. There is mild calcification of the aortic valve. There is mild thickening of the aortic valve. Aortic  valve regurgitation is not visualized. Aortic valve sclerosis is present, with no evidence of aortic valve stenosis.  6. The inferior vena cava is normal in size with greater than 50% respiratory variability, suggesting right atrial pressure of 3 mmHg. Comparison(s): Prior images reviewed side by side. The left ventricular function is worsened. The left ventricular wall motion abnormality is new. FINDINGS  Left Ventricle: Left ventricular ejection fraction, by estimation, is 25 to 30%. The left ventricle has severely decreased function. The left ventricle demonstrates global hypokinesis. Severe hypokinesis of the left ventricular, mid-apical inferolateral  wall, inferior wall and inferoseptal wall. The left ventricular internal cavity size was mildly dilated. There is mild eccentric left ventricular hypertrophy. Left ventricular diastolic parameters are consistent with Grade I diastolic dysfunction (impaired relaxation). Normal left ventricular filling pressure.  LV Wall Scoring: There is moderate global hypokinesis, with disproportionately severe hypokinesis in the mid-distal inferior wall and neighboring inferoseptal and inferolateral wall segments. The regional distribution is not entirely typical for usual coronary distribution, but favors right coronary artery stenosis. Right Ventricle: The right ventricular size is normal. No increase in right ventricular wall thickness. Right ventricular systolic function is normal. Tricuspid regurgitation signal is inadequate for assessing PA pressure. The tricuspid regurgitant velocity is 1.81 m/s, and with an assumed right atrial pressure of 3 mmHg, the estimated right ventricular systolic pressure is 54.4 mmHg. Left Atrium: Left atrial size was mild to moderately dilated. Right Atrium: Right atrial size was normal in size. Pericardium: There is no evidence of pericardial effusion. Mitral Valve: The mitral valve is normal in structure. Mild mitral valve regurgitation.  Tricuspid Valve: The tricuspid valve is normal in structure. Tricuspid valve regurgitation is trivial. Aortic Valve: The aortic valve is tricuspid. There is mild calcification of the aortic valve. There is mild thickening of the aortic valve. Aortic valve regurgitation is not visualized. Aortic valve sclerosis is present, with no evidence of aortic valve stenosis. Aortic valve peak gradient measures 9.3 mmHg. Pulmonic Valve: The pulmonic valve was normal in structure. Pulmonic valve regurgitation is mild. Aorta: The aortic root and ascending aorta are structurally normal, with no evidence of dilitation. Venous: The inferior vena cava is normal in size with greater than 50% respiratory variability, suggesting right atrial pressure of 3 mmHg. IAS/Shunts: No atrial level shunt detected by color flow Doppler.  LEFT VENTRICLE PLAX 2D LVIDd:         5.90 cm      Diastology LVIDs:         5.30 cm      LV e' medial:  4.35 cm/s LV PW:         1.30 cm      LV e' lateral: 11.00 cm/s LV IVS:        1.30 cm  LV Volumes (MOD) LV vol d, MOD A4C: 138.0 ml LV vol s, MOD A4C: 107.0 ml LV SV MOD A4C:     138.0 ml RIGHT VENTRICLE             IVC RV Basal diam:  2.90 cm     IVC diam: 2.00 cm RV S prime:     18.50 cm/s TAPSE (M-mode): 2.6 cm LEFT ATRIUM             Index        RIGHT ATRIUM           Index LA diam:        2.70 cm 1.46 cm/m   RA Area:     15.90 cm LA Vol (A2C):   81.8 ml 44.34 ml/m  RA Volume:   40.80 ml  22.12 ml/m LA Vol (A4C):   47.4 ml 25.70 ml/m LA Biplane Vol: 63.9 ml 34.64 ml/m  AORTIC VALVE              PULMONIC VALVE AV Vmax:      152.50 cm/s PV Vmax:       0.65 m/s AV Peak Grad: 9.3 mmHg    PV Peak grad:  1.7 mmHg LVOT Vmax:    108.00 cm/s LVOT Vmean:   63.600 cm/s LVOT VTI:     0.179 m  AORTA Ao Root diam: 3.80 cm Ao Asc diam:  3.70 cm MR Peak grad: 109.8 mmHg  TRICUSPID VALVE MR Vmax:      524.00 cm/s TR Peak grad:   13.1 mmHg                           TR Vmax:        181.00 cm/s                             SHUNTS                           Systemic VTI: 0.18 m Dani Gobble Croitoru MD Electronically signed by Sanda Klein MD Signature Date/Time: 06/01/2021/12:56:54 PM    Final    [place] LIMITED JOINT SPACE STRUCTURES UP LEFT(NO LINKED CHARGES)  Result Date:  06/01/2021 No images saved   Disposition   Pt is being discharged home today in good condition.  Follow-up Plans & Appointments     Follow-up Information     Imogene Burn, PA-C Follow up on 06/12/2021.   Specialty: Cardiology Why: Appointment at 8:15 AM, please arrive 15 minutes prior to appointment. Contact information: [address] [place] [address] [phone]                Discharge Instructions     Diet - low sodium heart healthy   Complete by: As directed    Discharge instructions   Complete by: As directed    No driving for 48 hours. No lifting over 5 lbs for 1 week. No sexual activity for 1 week. You may return to work on 06/05/21. Keep procedure site clean & dry. If you notice increased pain, swelling, bleeding or pus, call/return!  You may shower, but no soaking baths/hot tubs/pools for 1 week.   Recommend avoiding lifting weights altogether at the gym for 2 or 3 weeks and avoid bench pressing or lifting extremely high weights indefinitely (prefer weight limit that allows her to perform 10-12 reps while still breathing, no straining).  Pay particular attention to sodium restriction and daily weight monitoring over the next couple of weeks.   Increase activity slowly   Complete by: As directed        Discharge Medications   Allergies as of 06/02/2021       Reactions   Invokana [canagliflozin] Other (See Comments)   YEAST INFECTIONS   Codeine Nausea And Vomiting   Doxycycline    GI upset, made feel "weird"   Flexeril [cyclobenzaprine]    sedation   Morphine And Related Itching   Reglan [metoclopramide] Other (See Comments)   "paralyzes me"   Silicon    In watch bands         Medication List     TAKE these medications    acetaminophen 500 MG tablet Commonly known as: TYLENOL Take 1,000 mg by mouth every 6 (six) hours as needed for mild pain or headache.   aspirin EC 81 MG tablet Take 81 mg by mouth daily. Swallow whole.   BD Pen Needle Micro U/F 32G X 6 MM Misc Generic drug: Insulin Pen Needle Inject 1 Units into the skin once a week.   budesonide-formoterol 80-4.5 MCG/ACT inhaler Commonly known as: SYMBICORT Inhale 2 puffs into the lungs 2 (two) times daily.   calcium carbonate 1250 (500 Ca) MG tablet Commonly known as: OS-CAL - dosed in mg of elemental calcium Take 2 tablets by mouth daily with breakfast.   carvedilol 25 MG tablet Commonly known as: COREG Take 1 tablet (25 mg total) by mouth 2 (two) times daily.   cholecalciferol 25 MCG (1000 UNIT) tablet Commonly known as: VITAMIN D3 Take 2,000 Units by mouth daily.   clonazePAM 1 MG tablet Commonly known as: KLONOPIN Take 1 tablet (1 mg total) by mouth at bedtime.   Entresto 97-103 MG Generic drug: sacubitril-valsartan Take 1 tablet by mouth 2 (two) times daily.   EPINEPHrine 0.3 mg/0.3 mL Soaj injection Commonly known as: EPI-PEN Inject 0.3 mg into the muscle as needed for anaphylaxis.   fenofibrate 145 MG tablet Commonly known as: Tricor Take 1 tablet (145 mg total) by mouth daily.   fluticasone 50 MCG/ACT nasal spray Commonly known as: FLONASE Place 1 spray into both nostrils daily as needed for allergies or rhinitis.  glucose blood test strip Commonly known as: ONE TOUCH ULTRA TEST Use to test blood sugar three times a week and prn if having symptoms of low blood sugar Dx: 250.92 90 day supply   Hair Skin and Nails Formula Tabs Take 1 tablet by mouth daily.   hydrALAZINE 50 MG tablet Commonly known as: APRESOLINE Take 1 tablet (50 mg total) by mouth 3 (three) times daily.   iron polysaccharides 150 MG capsule Commonly known as: NIFEREX Take 150 mg by mouth  daily.   isosorbide dinitrate 20 MG tablet Commonly known as: ISORDIL Take 1 tablet (20 mg total) by mouth 3 (three) times daily.   levocetirizine 5 MG tablet Commonly known as: XYZAL Take 5 mg by mouth every evening.   Linzess 145 MCG Caps capsule Generic drug: linaclotide TAKE 1 CAPSULE BY MOUTH ONCE DAILY BEFORE BREAKFAST What changed: See the new instructions.   omega-3 acid ethyl esters 1 g capsule Commonly known as: Lovaza Take 2 capsules (2 g total) by mouth 2 (two) times daily.   omeprazole 40 MG capsule Commonly known as: PRILOSEC TAKE 1 CAPSULE BY MOUTH TWICE DAILY AT  8AM  AND  10PM What changed:  how much to take how to take this when to take this additional instructions   OneTouch Verio IQ System w/Device Kit 1 Act by Does not apply route 3 (three) times daily.   Ozempic (2 MG/DOSE) 8 MG/3ML Sopn Generic drug: Semaglutide (2 MG/DOSE) Inject 2 mg into the skin once a week. What changed: additional instructions   simvastatin 40 MG tablet Commonly known as: ZOCOR TAKE 1 TABLET BY MOUTH AT BEDTIME   spironolactone 25 MG tablet Commonly known as: ALDACTONE Take 1 tablet (25 mg total) by mouth daily.   zinc gluconate 50 MG tablet Take 50 mg by mouth daily.           Outstanding Labs/Studies     Duration of Discharge Encounter   Greater than 30 minutes including physician time.  [place], [place] 06/02/2021, 11:05 AM   Cardiology Attending  Patient seen and examined. Agree with the findings as noted above. She is at baseline with regard to her dyspnea. She is stable for DC home. I have examined the patient and concur with what is documented above. Agree with plan as outlined above as well.  Carleene Overlie ,MD

## 2021-06-01 NOTE — CV Procedure (Signed)
Normal coronary arteries without obstructive disease. Normal LVEDP at 13 mmHg.  EF is decreased in the range of 30 to 40%.

## 2021-06-01 NOTE — Progress Notes (Signed)
Echocardiogram 2D Echocardiogram has been performed.  Carolyn [name] 06/01/2021, 9:07 AM

## 2021-06-01 NOTE — Progress Notes (Signed)
Mobility Specialist Progress Note:   06/01/21 1200  Mobility  Activity Ambulated independently in hallway  Level of Assistance Independent  Assistive Device None  Distance Ambulated (ft) 950 ft  Activity Response Tolerated well  $Mobility charge 1 Mobility   Pt asx during ambulation. Back in chair, eager for procedure today.   Nelta Numbers [facility] Phone: [phone] Office Phone: [phone]

## 2021-06-01 NOTE — Progress Notes (Addendum)
Progress Note  Patient Name: Carolyn [name] Date of Encounter: 06/01/2021  [facility] Cardiologist: Jenkins Rouge, MD   Subjective   Denies angina or chest pain.  Just completed cardiac catheterization which shows no evidence of coronary stenoses.  No problems at radial access site (TR band is still on).  LVEDP was normal at 12 mmHg.  Inpatient Medications    Scheduled Meds:  [MAR Hold] carvedilol  25 mg Oral BID   [MAR Hold] clonazePAM  1 mg Oral QHS   [MAR Hold] fenofibrate  160 mg Oral Daily   [MAR Hold] hydrALAZINE  50 mg Oral TID   [MAR Hold] iron polysaccharides  150 mg Oral Daily   [MAR Hold] isosorbide dinitrate  20 mg Oral TID   [MAR Hold] linaclotide  145 mcg Oral QAC breakfast   [MAR Hold] loratadine  5 mg Oral QPM   [MAR Hold] mometasone-formoterol  2 puff Inhalation BID   [MAR Hold] omega-3 acid ethyl esters  2 g Oral BID   [MAR Hold] pantoprazole  40 mg Oral Daily   [MAR Hold] sacubitril-valsartan  1 tablet Oral BID   [MAR Hold] simvastatin  40 mg Oral QHS   [MAR Hold] sodium chloride flush  3 mL Intravenous Q12H   [MAR Hold] spironolactone  25 mg Oral Daily   [MAR Hold] zinc sulfate  220 mg Oral Daily   Continuous Infusions:  sodium chloride     sodium chloride 1 mL/kg/hr (06/01/21 0100)   heparin Stopped (06/01/21 1205)   PRN Meds: sodium chloride, [MAR Hold] acetaminophen, [MAR Hold] ALPRAZolam, fentaNYL, Heparin (Porcine) in NaCl, heparin sodium (porcine), lidocaine (PF), midazolam, [MAR Hold] nitroGLYCERIN, [MAR Hold] ondansetron (ZOFRAN) IV, Radial Cocktail/Verapamil only, sodium chloride flush, [MAR Hold] zolpidem   Vital Signs    Vitals:   06/01/21 0400 06/01/21 0718 06/01/21 0739 06/01/21 1113  BP: 122/85 (!) 142/92  126/81  Pulse: 95 89  90  Resp: 15 16  15   Temp: 98.1 F (36.7 C) 98.2 F (36.8 C)  98.3 F (36.8 C)  TempSrc: Oral Oral  Oral  SpO2: 98% 94% 99% 98%  Weight: 79 kg       Intake/Output Summary (Last 24 hours) at  06/01/2021 1254 Last data filed at 06/01/2021 0700 Gross per 24 hour  Intake 558.62 ml  Output --  Net 558.62 ml   Last 3 Weights 06/01/2021 05/31/2021 05/17/2021  Weight (lbs) 174 lb 2.6 oz 174 lb 2.6 oz 174 lb  Weight (kg) 79 kg 79 kg 78.926 kg      Telemetry    Sinus rhythm- Personally Reviewed  ECG    Sinus tachycardia with occasional PVCs, poor R wave progression in the precordial leads, no acute repolarization changes- Personally Reviewed  Physical Exam  Comfortable lying flat in bed.  No bleeding at right radial access site with TR band in place GEN: No acute distress.   Neck: No JVD Cardiac: RRR, no murmurs, rubs, or gallops.  Respiratory: Clear to auscultation bilaterally. GI: Soft, nontender, non-distended  MS: No edema; No deformity. Neuro:  Nonfocal  Psych: Normal affect   Labs    High Sensitivity Troponin:   Recent Labs  Lab 05/31/21 1309 05/31/21 1444 05/31/21 2226  TROPONINIHS 53* 133* 279*     Chemistry Recent Labs  Lab 05/31/21 1309 06/01/21 0131  NA 138 138  K 3.8 3.3*  CL 106 108  CO2 21* 21*  GLUCOSE 88 99  BUN 18 18  CREATININE 0.98 0.92  CALCIUM 10.2 9.3  PROT  --  6.5  ALBUMIN  --  3.8  AST  --  17  ALT  --  16  ALKPHOS  --  31*  BILITOT  --  0.5  GFRNONAA >60 >60  ANIONGAP 11 9    Lipids  Recent Labs  Lab 06/01/21 0131  CHOL 148  TRIG 139  HDL 42  LDLCALC 78  CHOLHDL 3.5    Hematology Recent Labs  Lab 05/31/21 1309 06/01/21 0131  WBC 9.4 9.2  RBC 3.96 3.56*  HGB 12.0 10.9*  HCT 36.2 32.5*  MCV 91.4 91.3  MCH 30.3 30.6  MCHC 33.1 33.5  RDW 12.8 12.8  PLT 435* 348   Thyroid  Recent Labs  Lab 05/31/21 2226  TSH 1.818    BNPNo results for input(s): BNP, PROBNP in the last 168 hours.  DDimer No results for input(s): DDIMER in the last 168 hours.   Radiology    DG Chest 2 View  Result Date: 05/31/2021 CLINICAL DATA:  Chest pain EXAM: CHEST - 2 VIEW COMPARISON:  Chest radiograph dated April 26, 2021  FINDINGS: The heart is mildly enlarged. Both lungs are clear. The visualized skeletal structures are unremarkable. IMPRESSION: No active cardiopulmonary disease. Electronically Signed   By: Keane Police D.O.   On: 05/31/2021 13:20    Cardiac Studies   ECHO 06/01/2021  Left ventricular ejection fraction, by estimation, is 25 to 30%. The left ventricle has severely decreased function. The left ventricle demonstrates global hypokinesis. The left ventricular internal cavity size was mildly dilated. There is mild eccentric left ventricular hypertrophy. Left ventricular diastolic parameters are consistent with Grade I diastolic dysfunction (impaired relaxation). There is severe hypokinesis of the left ventricular, mid-apical inferolateral wall, inferior wall and inferoseptal wall. There is moderate global hypokinesis, with disproportionately severe hypokinesis in the mid-distal inferior wall and neighboring inferoseptal and inferolateral wall segments. The regional distribution is not entirely typical for usual coronary distribution. Right ventricular systolic function is normal. The right ventricular size is normal. Tricuspid regurgitation signal is inadequate for assessing PA pressure. Left atrial size was mild to moderately dilated. The mitral valve is normal in structure. Mild mitral valve regurgitation. The aortic valve is tricuspid. There is mild calcification of the aortic valve. There is mild thickening of the aortic valve. Aortic valve regurgitation is not visualized. Aortic valve sclerosis is present, with no evidence of aortic valve stenosis. The inferior vena cava is normal in size with greater than 50% respiratory variability, suggesting right atrial pressure of 3 mmHg.  Patient Profile     61 y.o. 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  Mild increase in cardiac enzymes.  Echo with apparent new regional wall motion abnormalities and drop in LVEF, but with clean coronary arteries.  Only logical explanation is an episode of stress cardiomyopathy.  The trigger is uncertain, but she had been bench pressing 140 pounds at the gym, more than ever before. CHF: Appears well compensated clinically and LVEDP only 12 mmHg, despite recent drop in EF.  She is on comprehensive medical therapy with Entresto and carvedilol and maximum doses, spironolactone and hydralazine/nitrates.  Not on SGLT2 inhibitor  due to history of urinary tract infections with this agent.  She appears clinically euvolemic.   Anticipate recovery of LV systolic function back to her previous baseline; repeat echo after several weeks. HTN: Controlled. HLP: On simvastatin.  Target LDL less than 70.  Recent was 78.  She does not have significant CAD or PAD.  I am not sure that we need to be more aggressive with lipid lowering.  Will defer to primary cardiologist at follow-up visit. DM: Excellent control hemoglobin A1c 5.6%.Taking Ozempic for her diabetes and she is intent on becoming failure and losing weight.   Discharge home after bedrest post cath.   Resume previous heart failure medications.   Recommend avoiding lifting weights altogether at the gym for 2 or 3 weeks and avoid bench pressing or lifting extremely high weights indefinitely (prefer weight limit that allows her to perform 10-12 reps while still breathing, no straining).   Pay particular attention to sodium restriction and daily weight monitoring over the next couple of weeks.   Repeat echo in 4-6 weeks.  For questions or updates, please contact [place] Please  consult [email or web address] for contact info under        Signed, Sanda Klein, MD  06/01/2021, 12:54 PM

## 2021-06-01 NOTE — TOC Progression Note (Signed)
Transition of Care [facility]) - Progression Note    Patient Details  Name: Carolyn [name] MRN: [id number] Date of Birth: November 08, 1960  Transition of Care [facility] CM/SW Contact  Zenon Mayo, RN Phone Number: 06/01/2021, 4:54 PM  Clinical Narrative:    from home, NSTEMI, echo done, for heart cath. Hep drip. TOC will continue to follow for dc needs.        Expected Discharge Plan and Services                                                 Social Determinants of Health (SDOH) Interventions    Readmission Risk Interventions No flowsheet data found.

## 2021-06-02 LAB — CBC
HCT: 31.5 % — ABNORMAL LOW (ref 36.0–46.0)
Hemoglobin: 10.2 g/dL — ABNORMAL LOW (ref 12.0–15.0)
MCH: 29.9 pg (ref 26.0–34.0)
MCHC: 32.4 g/dL (ref 30.0–36.0)
MCV: 92.4 fL (ref 80.0–100.0)
Platelets: 340 10*3/uL (ref 150–400)
RBC: 3.41 MIL/uL — ABNORMAL LOW (ref 3.87–5.11)
RDW: 12.9 % (ref 11.5–15.5)
WBC: 6.9 10*3/uL (ref 4.0–10.5)
nRBC: 0 % (ref 0.0–0.2)

## 2021-06-02 NOTE — Progress Notes (Signed)
Removed patient's IV and applied a splint to right wrist prevent bending of wrist post cath. SWOT RN

## 2021-06-04 ENCOUNTER — Encounter (HOSPITAL_COMMUNITY): Payer: Self-pay | Admitting: Interventional Cardiology

## 2021-06-04 DIAGNOSIS — Z0279 Encounter for issue of other medical certificate: Secondary | ICD-10-CM
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
## 2021-06-04 NOTE — Telephone Encounter (Signed)
Forms have been signed and faxed back  Copy sent to the pt Copy given to charge Original filed with CMA

## 2021-06-07 ENCOUNTER — Ambulatory Visit (INDEPENDENT_AMBULATORY_CARE_PROVIDER_SITE_OTHER): Payer: No Typology Code available for payment source | Admitting: Pharmacist

## 2021-06-07 ENCOUNTER — Other Ambulatory Visit: Payer: Self-pay

## 2021-06-07 VITALS — BP 116/74 | HR 84 | Wt 167.0 lb

## 2021-06-07 DIAGNOSIS — R931 Abnormal findings on diagnostic imaging of heart and coronary circulation: Secondary | ICD-10-CM | POA: Diagnosis not present

## 2021-06-07 DIAGNOSIS — E118 Type 2 diabetes mellitus with unspecified complications: Secondary | ICD-10-CM | POA: Diagnosis not present

## 2021-06-07 MED ORDER — ROSUVASTATIN CALCIUM 20 MG PO TABS: 20.0000 mg | ORAL_TABLET | Freq: Every day | ORAL | 3 refills | Status: DC

## 2021-06-07 NOTE — Patient Instructions (Addendum)
Continue hydralazine 50 mg three times a day, isosorbide dinitrate 20 mg three times a day, spironolactone 25 mg daily, Entresto 97/103 mg twice a day, carvedilol 25 mg twice a day  STOP simvastatin and start rosuvastatin 20mg  daily  We will check lipid panel again at next visit

## 2021-06-07 NOTE — Progress Notes (Signed)
Patient ID: MALAYIA SPIZZIRRI                 DOB: January 29, 1961                      MRN: [id number]     HPI: Carolyn [name] is a 61 y.o. female referred by Dr. Johnsie Cancel to pharmacy clinic for HF medication management. PMH is significant for HTN, OSA, anemia, DM and recent diagnosis of DCM. Most recent LVEF 35 to 40% on 11/23/20.   At previous pharmD visits patient was slowly titrated off of clonidine and her CDMT was optimized. She was enrolled in the [facility] prep class. Her Rybelsus was changed to Ozempic. At last visit, her home blood pressures reported were at goal and now medication changes were made. She has been on SGLT2i in the past and had yeast infection. Is prone to yeast infections. She was seen in the ED on 2/18 with chest pain and elevated troponin. Cath was clean. Thought to be stress cardiomyopathy.  Patient presents today for follow up. She denies any current dizziness, lightheadedness, headache, blurred vision, SOB or swelling. She brings in a list of blood pressures. Other than the blood pressure the day she was admitted to the hospital, they are well controlled. Wants to get back to the gym. Has been walking at her house and using 3lb weights since they told her she could not go to the gym for 3 weeks.  Doing well on Ozempic 76m weekly. She takes the Ozempic on Saturday nights. This helps her avoid any nausea. Is down 7lb since the beginning of Feb and A1C is well controlled.   Current CHF meds: hydralazine 50 mg TID, isosorbide dinitrate 20 mg TID, spironolactone 25 mg daily, Entresto 97/103 mg BID, carvedilol 25 mg BID Other BP Meds: Previously tried: Invokana (yeast infection) BP goal: <130/80   Family History: The patient's family history includes Alzheimer's disease in her mother; Breast cancer in her sister; Cancer in her sister; Diabetes in her brother, sister, and sister; Emphysema in her father and sister; Hypertension in her mother; Thyroid cancer in her sister. There is no  history of Colon cancer.  Home BP: 110/80, 134/74, 104/68, 115/73, 109/73, 121/79, 119/75 (HR 87, 83, 97, 90, 103, 111, 98, 91)  Exercise: going to gym treadmill, leg presses, chest press, crunches  Diet: breakfast: apple Lunch: salad (oil and vinegar dressing)  Dinner: protein and green beans Snack: Tortilla chips with lime, carrot sticks  Wt Readings from Last 3 Encounters:  06/02/21 171 lb 6.4 oz (77.7 kg)  05/17/21 174 lb (78.9 kg)  05/09/21 173 lb (78.5 kg)   BP Readings from Last 3 Encounters:  06/02/21 139/85  05/17/21 120/80  05/09/21 120/70   Pulse Readings from Last 3 Encounters:  06/02/21 91  05/17/21 85  05/09/21 95    Renal function: Estimated Creatinine Clearance: 66.4 mL/min (by C-G formula based on SCr of 0.92 mg/dL).  Past Medical History:  Diagnosis Date   Allergy    seasonal   Anemia    Anemia of chronic disease suspected   Anxiety attack    Arthritis    Asthmatic bronchitis 04/26/2021   Benign fundic gland polyps of stomach    Dyslipidemia    Exertional shortness of breath    GERD (gastroesophageal reflux disease)    Heart murmur    Hyperlipidemia    Hypertension    Migraines    "probably weekly" (01/14/2013)  Type II diabetes mellitus ([place])     Current Outpatient Medications on File Prior to Visit  Medication Sig Dispense Refill   acetaminophen (TYLENOL) 500 MG tablet Take 1,000 mg by mouth every 6 (six) hours as needed for mild pain or headache.     aspirin EC 81 MG tablet Take 81 mg by mouth daily. Swallow whole.     Blood Glucose Monitoring Suppl (ONETOUCH VERIO IQ SYSTEM) W/DEVICE KIT 1 Act by Does not apply route 3 (three) times daily. 2 kit 0   budesonide-formoterol (SYMBICORT) 80-4.5 MCG/ACT inhaler Inhale 2 puffs into the lungs 2 (two) times daily. 3 each 0   calcium carbonate (OS-CAL - DOSED IN MG OF ELEMENTAL CALCIUM) 1250 (500 Ca) MG tablet Take 2 tablets by mouth daily with breakfast.     carvedilol (COREG) 25 MG tablet Take 1  tablet (25 mg total) by mouth 2 (two) times daily. 180 tablet 3   cholecalciferol (VITAMIN D3) 25 MCG (1000 UNIT) tablet Take 2,000 Units by mouth daily.     clonazePAM (KLONOPIN) 1 MG tablet Take 1 tablet (1 mg total) by mouth at bedtime. 90 tablet 1   EPINEPHrine 0.3 mg/0.3 mL IJ SOAJ injection Inject 0.3 mg into the muscle as needed for anaphylaxis. 1 each 0   fenofibrate (TRICOR) 145 MG tablet Take 1 tablet (145 mg total) by mouth daily. 30 tablet 5   fluticasone (FLONASE) 50 MCG/ACT nasal spray Place 1 spray into both nostrils daily as needed for allergies or rhinitis.     glucose blood (ONE TOUCH ULTRA TEST) test strip Use to test blood sugar three times a week and prn if having symptoms of low blood sugar Dx: 250.92 90 day supply 100 each 11   hydrALAZINE (APRESOLINE) 50 MG tablet Take 1 tablet (50 mg total) by mouth 3 (three) times daily. 270 tablet 3   Insulin Pen Needle (BD PEN NEEDLE MICRO U/F) 32G X 6 MM MISC Inject 1 Units into the skin once a week. 100 each 3   iron polysaccharides (NIFEREX) 150 MG capsule Take 150 mg by mouth daily.     isosorbide dinitrate (ISORDIL) 20 MG tablet Take 1 tablet (20 mg total) by mouth 3 (three) times daily. 270 tablet 3   levocetirizine (XYZAL) 5 MG tablet Take 5 mg by mouth every evening.     LINZESS 145 MCG CAPS capsule TAKE 1 CAPSULE BY MOUTH ONCE DAILY BEFORE BREAKFAST (Patient taking differently: Take 145 mcg by mouth daily.) 90 capsule 1   Multiple Vitamins-Minerals (HAIR SKIN AND NAILS FORMULA) TABS Take 1 tablet by mouth daily.     omega-3 acid ethyl esters (LOVAZA) 1 g capsule Take 2 capsules (2 g total) by mouth 2 (two) times daily. 60 capsule 5   omeprazole (PRILOSEC) 40 MG capsule TAKE 1 CAPSULE BY MOUTH TWICE DAILY AT  8AM  AND  10PM (Patient taking differently: Take 40 mg by mouth in the morning and at bedtime.) 180 capsule 1   sacubitril-valsartan (ENTRESTO) 97-103 MG Take 1 tablet by mouth 2 (two) times daily. 60 tablet 11   Semaglutide,  2 MG/DOSE, (OZEMPIC, 2 MG/DOSE,) 8 MG/3ML SOPN Inject 2 mg into the skin once a week. (Patient taking differently: Inject 2 mg into the skin once a week. Friday's) 3 mL 11   simvastatin (ZOCOR) 40 MG tablet TAKE 1 TABLET BY MOUTH AT BEDTIME (Patient taking differently: Take 40 mg by mouth at bedtime.) 90 tablet 1   spironolactone (ALDACTONE) 25 MG tablet  Take 1 tablet (25 mg total) by mouth daily. 90 tablet 3   zinc gluconate 50 MG tablet Take 50 mg by mouth daily.     No current facility-administered medications on file prior to visit.    Allergies  Allergen Reactions   Invokana [Canagliflozin] Other (See Comments)    YEAST INFECTIONS   Codeine Nausea And Vomiting   Doxycycline     GI upset, made feel "weird"   Flexeril [Cyclobenzaprine]     sedation   Morphine And Related Itching   Reglan [Metoclopramide] Other (See Comments)    "paralyzes me"   Silicon     In watch bands     Assessment/Plan:  1. CHF -  BP is at goal of <130/80 in clinic today. Continue hydralazine 50 mg TID, isosorbide dinitrate 20 mg TID, spironolactone 25 mg daily, Entresto 97/103 mg BID and carvedilol 68m BID. Follow up in 2 months per pt request.   2. DM / weight loss - Patient doing well on Ozempic 2 mg. Has lost 17lb since she started following with me in Sept. Really enjoying working out. Upset she cannot lift heavy. Advised that she can still see effects with lighter weight and higher reps.  3. Hyperlipidemia- TG are much improved. LDL-C 78. Continue fenofibrate 145 and Lovaza 2g BID. Patient has a positive calcium score of 375 (98th percentile). Her LHC did show normal coronary arteries. But considering her risk factors (HTN, DM, high TG) would consider being more aggressive with an LDL-C goal of <70 and ApoB <80. I think she could benefit from a high intensity statin. Will change simvastatin to rosuvastatin 256mdaily. Recheck lipids in 2 months at next appointment.  MeRamond DialPharm.D, BCPS,  CPP [id number]. [address]GrPark Forest[place] [id number]: [phone]: [phone]

## 2021-06-12 ENCOUNTER — Other Ambulatory Visit: Payer: Self-pay

## 2021-06-12 ENCOUNTER — Ambulatory Visit (INDEPENDENT_AMBULATORY_CARE_PROVIDER_SITE_OTHER): Payer: No Typology Code available for payment source | Admitting: Physician Assistant

## 2021-06-12 VITALS — BP 130/88 | HR 93 | Ht 64.0 in | Wt 167.6 lb

## 2021-06-12 DIAGNOSIS — I10 Essential (primary) hypertension: Secondary | ICD-10-CM

## 2021-06-12 DIAGNOSIS — I2510 Atherosclerotic heart disease of native coronary artery without angina pectoris: Secondary | ICD-10-CM

## 2021-06-12 DIAGNOSIS — E785 Hyperlipidemia, unspecified: Secondary | ICD-10-CM | POA: Diagnosis not present

## 2021-06-12 DIAGNOSIS — I428 Other cardiomyopathies: Secondary | ICD-10-CM | POA: Diagnosis not present

## 2021-06-12 DIAGNOSIS — E118 Type 2 diabetes mellitus with unspecified complications: Secondary | ICD-10-CM

## 2021-06-12 DIAGNOSIS — Z794 Long term (current) use of insulin: Secondary | ICD-10-CM

## 2021-06-12 LAB — BASIC METABOLIC PANEL
BUN/Creatinine Ratio: 19 (ref 9–23)
BUN: 20 mg/dL (ref 6–24)
CO2: 23 mmol/L (ref 20–29)
Calcium: 10 mg/dL (ref 8.7–10.2)
Chloride: 106 mmol/L (ref 96–106)
Creatinine, Ser: 1.05 mg/dL — ABNORMAL HIGH (ref 0.57–1.00)
Glucose: 105 mg/dL — ABNORMAL HIGH (ref 70–99)
Potassium: 3.8 mmol/L (ref 3.5–5.2)
Sodium: 143 mmol/L (ref 134–144)
eGFR: 61 mL/min/{1.73_m2} (ref >59)

## 2021-06-12 NOTE — Patient Instructions (Addendum)
Medication Instructions:  Your physician recommends that you continue on your current medications as directed. Please refer to the Current Medication list given to you today.  *If you need a refill on your cardiac medications before your next appointment, please call your pharmacy*   Lab Work: TODAY:  BMET  If you have labs (blood work) drawn today and your tests are completely normal, you will receive your results only by: [place] (if you have MyChart) OR A paper copy in the mail If you have any lab test that is abnormal or we need to change your treatment, we will call you to review the results.   Testing/Procedures: Your physician has requested that you have an echocardiogram April 2023. Echocardiography is a painless test that uses sound waves to create images of your heart. It provides your doctor with information about the size and shape of your heart and how well your hearts chambers and valves are working. This procedure takes approximately one hour. There are no restrictions for this procedure.    Follow-Up: At [facility], you and your health needs are our priority.  As part of our continuing mission to provide you with exceptional heart care, we have created designated Provider Care Teams.  These Care Teams include your primary Cardiologist (physician) and Advanced Practice Providers (APPs -  Physician Assistants and Nurse Practitioners) who all work together to provide you with the care you need, when you need it.  We recommend signing up for the patient portal called "MyChart".  Sign up information is provided on this After Visit Summary.  MyChart is used to connect with patients for Virtual Visits (Telemedicine).  Patients are able to view lab/test results, encounter notes, upcoming appointments, etc.  Non-urgent messages can be sent to your provider as well.   To learn more about what you can do with MyChart, go to [email or web address].    Your next  appointment:   2 month(s)  The format for your next appointment:   In Person  Provider:   Jenkins Rouge, MD    Other Instructions No heavy lifting / straining at the [facility].

## 2021-06-13 ENCOUNTER — Encounter: Payer: Self-pay | Admitting: Internal Medicine

## 2021-06-15 ENCOUNTER — Other Ambulatory Visit: Payer: Self-pay | Admitting: Internal Medicine

## 2021-06-15 DIAGNOSIS — F411 Generalized anxiety disorder: Secondary | ICD-10-CM

## 2021-06-15 DIAGNOSIS — F410 Panic disorder [episodic paroxysmal anxiety] without agoraphobia: Secondary | ICD-10-CM

## 2021-06-15 MED ORDER — CLONAZEPAM 1 MG PO TABS: 1.0000 mg | ORAL_TABLET | Freq: Two times a day (BID) | ORAL | 0 refills | Status: DC | PRN
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
## 2021-06-28 ENCOUNTER — Ambulatory Visit (INDEPENDENT_AMBULATORY_CARE_PROVIDER_SITE_OTHER): Payer: No Typology Code available for payment source | Admitting: Family Medicine

## 2021-06-28 ENCOUNTER — Encounter: Payer: Self-pay | Admitting: Family Medicine

## 2021-06-28 ENCOUNTER — Other Ambulatory Visit: Payer: Self-pay

## 2021-06-28 DIAGNOSIS — M1711 Unilateral primary osteoarthritis, right knee: Secondary | ICD-10-CM

## 2021-06-28 NOTE — Assessment & Plan Note (Signed)
Viscosupplementation given today.  Patient failed all other conservative therapy.  Discussed icing regimen and home exercises.  Increase activity slowly.  Follow-up again in 6 to 8 weeks. ?

## 2021-06-28 NOTE — Patient Instructions (Signed)
Injected R knee today ?See me again in 6 weeks ?

## 2021-07-08 ENCOUNTER — Other Ambulatory Visit: Payer: Self-pay | Admitting: Cardiovascular Disease

## 2021-07-08 ENCOUNTER — Other Ambulatory Visit: Payer: Self-pay | Admitting: Internal Medicine

## 2021-07-08 DIAGNOSIS — K5904 Chronic idiopathic constipation: Secondary | ICD-10-CM

## 2021-07-12 ENCOUNTER — Encounter: Payer: Self-pay | Admitting: Internal Medicine

## 2021-07-12 ENCOUNTER — Encounter: Payer: Self-pay | Admitting: Cardiovascular Disease

## 2021-07-12 ENCOUNTER — Encounter: Payer: Self-pay | Admitting: Family Medicine

## 2021-07-15 ENCOUNTER — Other Ambulatory Visit: Payer: Self-pay | Admitting: Cardiovascular Disease

## 2021-07-24 ENCOUNTER — Other Ambulatory Visit: Payer: Self-pay | Admitting: Internal Medicine

## 2021-07-24 DIAGNOSIS — K2100 Gastro-esophageal reflux disease with esophagitis, without bleeding: Secondary | ICD-10-CM
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
## 2021-08-02 ENCOUNTER — Ambulatory Visit (HOSPITAL_COMMUNITY): Payer: No Typology Code available for payment source | Attending: Cardiology

## 2021-08-02 DIAGNOSIS — I2510 Atherosclerotic heart disease of native coronary artery without angina pectoris: Secondary | ICD-10-CM | POA: Diagnosis present

## 2021-08-02 DIAGNOSIS — Z794 Long term (current) use of insulin: Secondary | ICD-10-CM | POA: Insufficient documentation

## 2021-08-02 DIAGNOSIS — E118 Type 2 diabetes mellitus with unspecified complications: Secondary | ICD-10-CM | POA: Diagnosis present

## 2021-08-02 DIAGNOSIS — E785 Hyperlipidemia, unspecified: Secondary | ICD-10-CM | POA: Diagnosis present

## 2021-08-02 DIAGNOSIS — I428 Other cardiomyopathies: Secondary | ICD-10-CM | POA: Diagnosis not present

## 2021-08-02 DIAGNOSIS — I10 Essential (primary) hypertension: Secondary | ICD-10-CM

## 2021-08-02 LAB — ECHOCARDIOGRAM COMPLETE
Area-P 1/2: 4.26 cm2
S' Lateral: 3.9 cm
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
## 2021-08-09 ENCOUNTER — Ambulatory Visit (INDEPENDENT_AMBULATORY_CARE_PROVIDER_SITE_OTHER): Payer: No Typology Code available for payment source

## 2021-08-09 ENCOUNTER — Ambulatory Visit (INDEPENDENT_AMBULATORY_CARE_PROVIDER_SITE_OTHER): Payer: No Typology Code available for payment source | Admitting: Family Medicine

## 2021-08-09 VITALS — BP 126/82 | HR 82 | Ht 64.0 in | Wt 165.0 lb

## 2021-08-09 DIAGNOSIS — M25562 Pain in left knee: Secondary | ICD-10-CM

## 2021-08-09 DIAGNOSIS — M1712 Unilateral primary osteoarthritis, left knee: Secondary | ICD-10-CM | POA: Insufficient documentation

## 2021-08-09 NOTE — Patient Instructions (Addendum)
Wart remover with band aid for week and then use pumice stone ?Injection in left knee today ?See you again in 6 weeks ?

## 2021-08-09 NOTE — Assessment & Plan Note (Signed)
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  Was seen on the x-rays and does appear to be stable.  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  Did respond well to the injections on the contralateral side.  Was given an injection for this problem today.  We will consider the possibility of viscosupplementation.  Hopefully we do get to avoid any surgical intervention.  Follow-up again 6 weeks ?

## 2021-08-11 ENCOUNTER — Other Ambulatory Visit: Payer: Self-pay | Admitting: Cardiovascular Disease

## 2021-08-13 ENCOUNTER — Encounter: Payer: Self-pay | Admitting: Cardiovascular Disease

## 2021-08-13 ENCOUNTER — Ambulatory Visit (INDEPENDENT_AMBULATORY_CARE_PROVIDER_SITE_OTHER): Payer: No Typology Code available for payment source | Admitting: Cardiovascular Disease

## 2021-08-13 VITALS — BP 112/78 | HR 94 | Ht 64.0 in | Wt 162.0 lb

## 2021-08-13 DIAGNOSIS — I5181 Takotsubo syndrome: Secondary | ICD-10-CM

## 2021-08-13 DIAGNOSIS — I428 Other cardiomyopathies: Secondary | ICD-10-CM | POA: Diagnosis not present

## 2021-08-13 NOTE — Patient Instructions (Addendum)
Medication Instructions:  ?Your physician recommends that you continue on your current medications as directed. Please refer to the Current Medication list given to you today. ? ?*If you need a refill on your cardiac medications before your next appointment, please call your pharmacy* ? ?Lab Work: ?If you have labs (blood work) drawn today and your tests are completely normal, you will receive your results only by: ?MyChart Message (if you have MyChart) OR ?A paper copy in the mail ?If you have any lab test that is abnormal or we need to change your treatment, we will call you to review the results. ? ?Testing/Procedures: ?Your physician has requested that you have a cardiac MRI in June. Cardiac MRI uses a computer to create images of your heart as its beating, producing both still and moving pictures of your heart and major blood vessels. For further information please visit [email or web address] Please follow the instruction sheet given to you today for more information.  ? ?Follow-Up: ?At [facility], you and your health needs are our priority.  As part of our continuing mission to provide you with exceptional heart care, we have created designated Provider Care Teams.  These Care Teams include your primary Cardiologist (physician) and Advanced Practice Providers (APPs -  Physician Assistants and Nurse Practitioners) who all work together to provide you with the care you need, when you need it. ? ?We recommend signing up for the patient portal called "MyChart".  Sign up information is provided on this After Visit Summary.  MyChart is used to connect with patients for Virtual Visits (Telemedicine).  Patients are able to view lab/test results, encounter notes, upcoming appointments, etc.  Non-urgent messages can be sent to your provider as well.   ?To learn more about what you can do with MyChart, go to [email or web address].   ? ?Your next appointment:   ?3 month(s) ? ?The format for your next appointment:    ?In Person ? ?Provider:   ?Jenkins Rouge, MD { ? ? ?Important Information About Sugar ? ? ? ? ?  ?

## 2021-08-16 ENCOUNTER — Encounter: Payer: Self-pay | Admitting: Cardiovascular Disease

## 2021-08-16 ENCOUNTER — Ambulatory Visit (INDEPENDENT_AMBULATORY_CARE_PROVIDER_SITE_OTHER): Payer: No Typology Code available for payment source | Admitting: Pharmacist

## 2021-08-16 VITALS — BP 126/82 | HR 83 | Wt 164.0 lb

## 2021-08-16 DIAGNOSIS — E669 Obesity, unspecified: Secondary | ICD-10-CM | POA: Diagnosis not present

## 2021-08-16 DIAGNOSIS — E118 Type 2 diabetes mellitus with unspecified complications: Secondary | ICD-10-CM | POA: Diagnosis not present

## 2021-08-16 DIAGNOSIS — E785 Hyperlipidemia, unspecified: Secondary | ICD-10-CM | POA: Diagnosis not present

## 2021-08-16 DIAGNOSIS — I10 Essential (primary) hypertension: Secondary | ICD-10-CM

## 2021-08-16 DIAGNOSIS — R931 Abnormal findings on diagnostic imaging of heart and coronary circulation: Secondary | ICD-10-CM

## 2021-08-16 NOTE — Progress Notes (Signed)
Patient ID: Carolyn [name]                 DOB: 1960/07/08                      MRN: [id number] ? ? ? ? ?HPI: Carolyn [name] is a 61 y.o. female referred by Dr. Johnsie Cancel to pharmacy clinic for HF medication management. PMH is significant for HTN, OSA, anemia, DM and recent diagnosis of DCM. Most recent LVEF 50% (08/02/21) improved from 35 to 40%. ?  ?At previous pharmD visits patient was slowly titrated off of clonidine and her CDMT was optimized. She was enrolled in the [facility] prep class. Her Rybelsus was changed to Ozempic. At last visit, her home blood pressures reported were at goal and no HF medication changes were made. Her statin was changed from simvastatin to rosuvastatin for better LDL-C and ApoB reduction. She has been on SGLT2i in the past and had yeast infection. Is prone to yeast infections. She was seen in the ED on 2/18 with chest pain and elevated troponin. Cath was clean. Thought to be stress cardiomyopathy. Patient requested to see my every few months. Saw Dr. Johnsie Cancel on 08/13/21.  ? ?Patient presents today for follow up. She reports feeling good. Still working out at [employer]. Walks on treadmill and lifts weight (no more than 30lb). States her family members says she doesn't eat enough but she doesn't think so. On Ozempic 97m daily. No side effects. A1C very well controlled. Reports BP well controlled at home. Doing well on rosuvastatin.  ? ? ?Current CHF meds: hydralazine 50 mg TID, isosorbide dinitrate 20 mg TID, spironolactone 25 mg daily, Entresto 97/103 mg BID, carvedilol 25 mg BID ?Other BP Meds: ?Previously tried: Invokana (yeast infection) ?BP goal: <130/80 ?  ?Family History: The patient's family history includes Alzheimer's disease in her mother; Breast cancer in her sister; Cancer in her sister; Diabetes in her brother, sister, and sister; Emphysema in her father and sister; Hypertension in her mother; Thyroid cancer in her sister. There is no history of Colon cancer. ? ?Home BP: Forgot log  book ? ?Exercise: going to gym treadmill, leg presses, chest press, crunches ? ?Diet: breakfast: apple w/ PB ?Lunch: chicken salad w/ lettuce wrap (oil and vinegar dressing)  ?Dinner: protein and green beans ?Snack: jelly beans, pineapple ?Drink: water ? ?Wt Readings from Last 3 Encounters:  ?08/13/21 162 lb (73.5 kg)  ?08/09/21 165 lb (74.8 kg)  ?06/28/21 167 lb (75.8 kg)  ? ?BP Readings from Last 3 Encounters:  ?08/13/21 112/78  ?08/09/21 126/82  ?06/28/21 132/90  ? ?Pulse Readings from Last 3 Encounters:  ?08/13/21 94  ?08/09/21 82  ?06/12/21 93  ? ? ?Renal function: ?CrCl cannot be calculated (Patient's most recent lab result is older than the maximum 21 days allowed.). ? ?Past Medical History:  ?Diagnosis Date  ? Allergy   ? seasonal  ? Anemia   ? Anemia of chronic disease suspected  ? Anxiety attack   ? Arthritis   ? Asthmatic bronchitis 04/26/2021  ? Benign fundic gland polyps of stomach   ? Dyslipidemia   ? Exertional shortness of breath   ? GERD (gastroesophageal reflux disease)   ? Heart murmur   ? Hyperlipidemia   ? Hypertension   ? Migraines   ? "probably weekly" (01/14/2013)  ? Type II diabetes mellitus ([place]   ? ? ?Current Outpatient Medications on File Prior to Visit  ?Medication Sig Dispense  Refill  ? acetaminophen (TYLENOL) 500 MG tablet Take 1,000 mg by mouth every 6 (six) hours as needed for mild pain or headache.    ? aspirin EC 81 MG tablet Take 81 mg by mouth daily. Swallow whole.    ? Blood Glucose Monitoring Suppl (ONETOUCH VERIO IQ SYSTEM) W/DEVICE KIT 1 Act by Does not apply route 3 (three) times daily. 2 kit 0  ? budesonide-formoterol (SYMBICORT) 80-4.5 MCG/ACT inhaler Inhale 2 puffs into the lungs 2 (two) times daily. 3 each 0  ? calcium carbonate (OS-CAL - DOSED IN MG OF ELEMENTAL CALCIUM) 1250 (500 Ca) MG tablet Take 2 tablets by mouth daily with breakfast.    ? carvedilol (COREG) 25 MG tablet Take 1 tablet (25 mg total) by mouth 2 (two) times daily. 180 tablet 3  ? cholecalciferol  (VITAMIN D3) 25 MCG (1000 UNIT) tablet Take 2,000 Units by mouth daily.    ? clonazePAM (KLONOPIN) 1 MG tablet Take 1 tablet (1 mg total) by mouth 2 (two) times daily as needed for anxiety. 180 tablet 0  ? EPINEPHrine 0.3 mg/0.3 mL IJ SOAJ injection Inject 0.3 mg into the muscle as needed for anaphylaxis. 1 each 0  ? fenofibrate (TRICOR) 145 MG tablet Take 1 tablet by mouth once daily 30 tablet 6  ? fluticasone (FLONASE) 50 MCG/ACT nasal spray Place 1 spray into both nostrils daily as needed for allergies or rhinitis.    ? glucose blood (ONE TOUCH ULTRA TEST) test strip Use to test blood sugar three times a week and prn if having symptoms of low blood sugar Dx: 250.92 90 day supply 100 each 11  ? hydrALAZINE (APRESOLINE) 50 MG tablet Take 1 tablet (50 mg total) by mouth 3 (three) times daily. 270 tablet 3  ? Insulin Pen Needle (BD PEN NEEDLE MICRO U/F) 32G X 6 MM MISC Inject 1 Units into the skin once a week. 100 each 3  ? iron polysaccharides (NIFEREX) 150 MG capsule Take 150 mg by mouth daily.    ? isosorbide dinitrate (ISORDIL) 20 MG tablet Take 1 tablet (20 mg total) by mouth 3 (three) times daily. 270 tablet 3  ? levocetirizine (XYZAL) 5 MG tablet Take 5 mg by mouth every evening.    ? LINZESS 145 MCG CAPS capsule TAKE 1 CAPSULE BY MOUTH ONCE DAILY BEFORE BREAKFAST 90 capsule 1  ? Multiple Vitamins-Minerals (HAIR SKIN AND NAILS FORMULA) TABS Take 1 tablet by mouth daily.    ? omega-3 acid ethyl esters (LOVAZA) 1 g capsule Take 2 capsules by mouth twice daily 360 capsule 3  ? omeprazole (PRILOSEC) 40 MG capsule TAKE 1 CAPSULE BY MOUTH TWICE DAILY AT  8  AM  AND  10  PM 180 capsule 0  ? rosuvastatin (CRESTOR) 20 MG tablet Take 1 tablet (20 mg total) by mouth daily. 90 tablet 3  ? sacubitril-valsartan (ENTRESTO) 97-103 MG Take 1 tablet by mouth 2 (two) times daily. 60 tablet 11  ? Semaglutide, 2 MG/DOSE, (OZEMPIC, 2 MG/DOSE,) 8 MG/3ML SOPN Inject 2 mg into the skin once a week. (Patient taking differently: Inject  2 mg into the skin once a week. Friday's) 3 mL 11  ? spironolactone (ALDACTONE) 25 MG tablet Take 1 tablet (25 mg total) by mouth daily. 90 tablet 3  ? zinc gluconate 50 MG tablet Take 50 mg by mouth daily.    ? ?No current facility-administered medications on file prior to visit.  ? ? ?Allergies  ?Allergen Reactions  ? Invokana [Canagliflozin]  Other (See Comments)  ?  YEAST INFECTIONS  ? Codeine Nausea And Vomiting  ? Doxycycline   ?  GI upset, made feel "weird"  ? Flexeril [Cyclobenzaprine]   ?  sedation  ? Morphine And Related Itching  ? Reglan [Metoclopramide] Other (See Comments)  ?  "paralyzes me"  ? Silicon   ?  In watch bands  ? ? ? ?Assessment/Plan: ? ?1. CHF -  BP is at goal of <130/80 in clinic today. Continue hydralazine 50 mg TID, isosorbide dinitrate 20 mg TID, spironolactone 25 mg daily, Entresto 97/103 mg BID and carvedilol 28m BID. EF has improved to 50%. Follow up as needed. ?  ?2. DM / weight loss - Patient doing well on Ozempic 2 mg. Has lost about 17lb since she started following with me in Sept. Really enjoying working out.  ? ?3. Hyperlipidemia- TG are much improved. LDL-C 78. Continue fenofibrate 145 and Lovaza 2g BID. Patient has a positive calcium score of 375 (98th percentile). Her LHC did show normal coronary arteries. But considering her risk factors (HTN, DM, high TG) would consider being more aggressive with an LDL-C goal of <70 and ApoB <80. She was started on rosuvastatin 220mdaily at last appointment. Check LDL-C and ApoB today. ? ?Thank you, ? ?MeRamond DialPharm.D, BCPS, CPP ?[id number]. Ch709 West Golf StreetGrSergeant BluffNC [address]?Phone: [phone]: [phone]? ?

## 2021-08-16 NOTE — Telephone Encounter (Signed)
Patient will send FMLA paper work by fax to correct.  Fax number provided. ?

## 2021-08-16 NOTE — Patient Instructions (Addendum)
Continue hydralazine 50 mg three times a day, isosorbide dinitrate 20 mg three times a day, spironolactone 25 mg daily, Entresto 97/103 mg twice a day, carvedilol 25 mg twice a day ? ?I will comment on your lab work on [place] ? ?Call me at [phone] with any questions ?

## 2021-08-17 LAB — LIPID PANEL
Chol/HDL Ratio: 3.3 ratio (ref 0.0–4.4)
Cholesterol, Total: 127 mg/dL (ref 100–199)
HDL: 39 mg/dL — ABNORMAL LOW (ref >39)
LDL Chol Calc (NIH): 72 mg/dL (ref 0–99)
Triglycerides: 83 mg/dL (ref 0–149)
VLDL Cholesterol Cal: 16 mg/dL (ref 5–40)

## 2021-08-17 LAB — APOLIPOPROTEIN B: Apolipoprotein B: 68 mg/dL (ref <90)

## 2021-08-20 ENCOUNTER — Telehealth: Payer: Self-pay

## 2021-08-20 NOTE — Telephone Encounter (Addendum)
Patient requesting medication for claustrophobia when MRI is done in June.

## 2021-08-28 NOTE — Telephone Encounter (Signed)
Per Dr. Johnsie Cancel, Can give 2 mg ativan or 5 mg of versed prior to scan by IV.  ?
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
## 2021-09-26 ENCOUNTER — Encounter: Payer: Self-pay | Admitting: Family Medicine

## 2021-09-26 ENCOUNTER — Ambulatory Visit (INDEPENDENT_AMBULATORY_CARE_PROVIDER_SITE_OTHER): Payer: No Typology Code available for payment source | Admitting: Family Medicine

## 2021-09-26 DIAGNOSIS — M1712 Unilateral primary osteoarthritis, left knee: Secondary | ICD-10-CM | POA: Diagnosis not present

## 2021-09-26 NOTE — Patient Instructions (Addendum)
Stay active See you again in 2-3 months if needed

## 2021-09-26 NOTE — Assessment & Plan Note (Signed)
Discussed the possibility of viscosupplementation but with patient doing relatively well we will hold on that at the moment.  Discussed icing regimen and home exercises, which activities including which ones to avoid.  Increase activity slowly otherwise.  Follow-up with me again in 6 to 8 weeks and still at that time.  Discussed the possibility of either viscosupplementation or steroid injection if needed patient has lost weight and encouraged her to continue to do so.

## 2021-10-02 ENCOUNTER — Encounter: Payer: Self-pay | Admitting: Cardiovascular Disease

## 2021-10-02 ENCOUNTER — Telehealth (HOSPITAL_COMMUNITY): Payer: Self-pay | Admitting: *Deleted

## 2021-10-02 NOTE — Telephone Encounter (Signed)
Patient is having MRI tomorrow. Will send phone note so they are aware of instructions for medications for claustrophobia.

## 2021-10-02 NOTE — Telephone Encounter (Signed)
Called patient to let her know that we have called in Valium 5 mg 1 tablet by mouth to take one hour before MRI with zero refills. Patient stated her sister is driving her to and from her MRI test.

## 2021-10-02 NOTE — Telephone Encounter (Signed)
Patient is having MRI in [facility]. Merle says there is not a nurse to give patient anything IV to help with claustrophobia. Can we send something to her pharmacy?

## 2021-10-02 NOTE — Telephone Encounter (Signed)
See mychart message. Per Dr. Johnsie Cancel, call in Valium 5 mg to take one hour prior to MRI.

## 2021-10-02 NOTE — Telephone Encounter (Signed)
Reaching out to patient to offer assistance regarding upcoming cardiac imaging study; pt verbalizes understanding of appt date/time, parking situation and where to check in, and verified current allergies; name and call back number provided for further questions should they arise  Gordy Clement RN [facility] [phone] office [phone] cell  Patient reports claustrophobia and has a klonopin prescription. She states she feels that medication doesn't work and will call Dr. Kyla Balzarine office for other suggestions. Denies metal other than orthopedic work in her leg.

## 2021-10-02 NOTE — Telephone Encounter (Signed)
Attempted to call patient regarding upcoming cardiac MRI appointment. Left message on voicemail with name and callback number    RN Navigator Cardiac [facility] [phone] Office [phone] Cell  

## 2021-10-03 ENCOUNTER — Ambulatory Visit
Admission: RE | Admit: 2021-10-03 | Discharge: 2021-10-03 | Disposition: A | Discharge status: Home / Self Care | Payer: No Typology Code available for payment source | Source: Ambulatory Visit | Attending: Cardiovascular Disease | Admitting: Cardiovascular Disease

## 2021-10-03 DIAGNOSIS — I428 Other cardiomyopathies: Secondary | ICD-10-CM | POA: Diagnosis not present

## 2021-10-03 MED ORDER — GADOBUTROL 1 MMOL/ML IV SOLN
10.0000 mL | Freq: Once | INTRAVENOUS | Status: AC | PRN
  Administered 2021-10-03: 10 mL via INTRAVENOUS

## 2021-11-10 ENCOUNTER — Other Ambulatory Visit: Payer: Self-pay | Admitting: Cardiovascular Disease

## 2021-11-10 ENCOUNTER — Other Ambulatory Visit: Payer: Self-pay | Admitting: Internal Medicine

## 2021-11-10 DIAGNOSIS — K2100 Gastro-esophageal reflux disease with esophagitis, without bleeding: Secondary | ICD-10-CM

## 2021-11-16 ENCOUNTER — Inpatient Hospital Stay: Payer: No Typology Code available for payment source | Attending: Hematology and Oncology

## 2021-11-16 ENCOUNTER — Other Ambulatory Visit: Payer: Self-pay

## 2021-11-16 DIAGNOSIS — D649 Anemia, unspecified: Secondary | ICD-10-CM | POA: Diagnosis present

## 2021-11-16 LAB — CBC WITH DIFFERENTIAL (CANCER CENTER ONLY)
Abs Immature Granulocytes: 0.01 10*3/uL (ref 0.00–0.07)
Basophils Absolute: 0.1 10*3/uL (ref 0.0–0.1)
Basophils Relative: 1 %
Eosinophils Absolute: 0.1 10*3/uL (ref 0.0–0.5)
Eosinophils Relative: 3 %
HCT: 27.7 % — ABNORMAL LOW (ref 36.0–46.0)
Hemoglobin: 9.3 g/dL — ABNORMAL LOW (ref 12.0–15.0)
Immature Granulocytes: 0 %
Lymphocytes Relative: 45 %
Lymphs Abs: 2.1 10*3/uL (ref 0.7–4.0)
MCH: 30.8 pg (ref 26.0–34.0)
MCHC: 33.6 g/dL (ref 30.0–36.0)
MCV: 91.7 fL (ref 80.0–100.0)
Monocytes Absolute: 0.3 10*3/uL (ref 0.1–1.0)
Monocytes Relative: 6 %
Neutro Abs: 2.1 10*3/uL (ref 1.7–7.7)
Neutrophils Relative %: 45 %
Platelet Count: 258 10*3/uL (ref 150–400)
RBC: 3.02 MIL/uL — ABNORMAL LOW (ref 3.87–5.11)
RDW: 12.9 % (ref 11.5–15.5)
WBC Count: 4.6 10*3/uL (ref 4.0–10.5)
nRBC: 0 % (ref 0.0–0.2)

## 2021-11-16 LAB — RETIC PANEL
Immature Retic Fract: 13.6 % (ref 2.3–15.9)
RBC.: 3.08 MIL/uL — ABNORMAL LOW (ref 3.87–5.11)
Retic Count, Absolute: 46.2 10*3/uL (ref 19.0–186.0)
Retic Ct Pct: 1.5 % (ref 0.4–3.1)
Reticulocyte Hemoglobin: 32.6 pg (ref >27.9)

## 2021-11-20 ENCOUNTER — Telehealth: Payer: Self-pay | Admitting: Pharmacist

## 2021-11-20 NOTE — Telephone Encounter (Signed)
Ozempic prior authorization submitted for continuation of therapy.

## 2021-11-22 NOTE — Telephone Encounter (Signed)
Prior auth approved through 11/21/22.
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
## 2021-11-27 ENCOUNTER — Ambulatory Visit (INDEPENDENT_AMBULATORY_CARE_PROVIDER_SITE_OTHER): Payer: No Typology Code available for payment source | Admitting: Cardiovascular Disease

## 2021-11-27 ENCOUNTER — Encounter: Payer: Self-pay | Admitting: Cardiovascular Disease

## 2021-11-27 VITALS — BP 110/72 | HR 79 | Ht 64.0 in | Wt 154.6 lb

## 2021-11-27 DIAGNOSIS — E785 Hyperlipidemia, unspecified: Secondary | ICD-10-CM

## 2021-11-27 DIAGNOSIS — I420 Dilated cardiomyopathy: Secondary | ICD-10-CM | POA: Diagnosis not present

## 2021-11-27 DIAGNOSIS — I10 Essential (primary) hypertension: Secondary | ICD-10-CM

## 2021-11-27 NOTE — Patient Instructions (Signed)
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
## 2021-11-28 ENCOUNTER — Encounter: Payer: Self-pay | Admitting: Family Medicine

## 2021-11-28 ENCOUNTER — Ambulatory Visit (INDEPENDENT_AMBULATORY_CARE_PROVIDER_SITE_OTHER): Payer: No Typology Code available for payment source | Admitting: Family Medicine

## 2021-11-28 DIAGNOSIS — M1711 Unilateral primary osteoarthritis, right knee: Secondary | ICD-10-CM

## 2021-11-28 NOTE — Assessment & Plan Note (Signed)
Chronic problem with worsening symptoms.  Discussed icing regimen and home exercises, discussed which activities to do which ones to avoid, increase activity slowly.  Patient has responded well to viscous Supplementation in the past and may consider again.  Follow-up again in 2 to 3 months

## 2021-11-28 NOTE — Patient Instructions (Addendum)
Injected R knee today Stay active! Branch is hotter 1 min jog, 1 min walk: week 1; 2 min jog, 1 min walk: week 2 and so on See me again in 3 months

## 2021-12-08 ENCOUNTER — Other Ambulatory Visit: Payer: Self-pay | Admitting: Cardiovascular Disease

## 2021-12-08 ENCOUNTER — Other Ambulatory Visit: Payer: Self-pay | Admitting: Internal Medicine

## 2021-12-08 DIAGNOSIS — K5904 Chronic idiopathic constipation: Secondary | ICD-10-CM

## 2022-01-08 ENCOUNTER — Telehealth: Payer: Self-pay | Admitting: Family Medicine

## 2022-01-08 NOTE — Telephone Encounter (Signed)
Ice 20 minutes 2 times daily. Usually after activity and before bed. Can try voltaren gel 2 times daily as well

## 2022-01-08 NOTE — Telephone Encounter (Signed)
Patient called stating that she is having really bad hip pain. She is scheduled to see Dr Tamala Julian on Thursday (and I have her on the cancellation list) but she asked if there was anything she could do between now and then to help?

## 2022-01-08 NOTE — Telephone Encounter (Signed)
Sent patient MyChart message with information.  

## 2022-01-09 ENCOUNTER — Emergency Department (HOSPITAL_BASED_OUTPATIENT_CLINIC_OR_DEPARTMENT_OTHER)
Admission: EM | Admit: 2022-01-09 | Discharge: 2022-01-09 | Disposition: A | Discharge status: Home / Self Care | Payer: No Typology Code available for payment source | Attending: Emergency Medicine | Admitting: Emergency Medicine

## 2022-01-09 ENCOUNTER — Other Ambulatory Visit: Payer: Self-pay

## 2022-01-09 ENCOUNTER — Encounter (HOSPITAL_BASED_OUTPATIENT_CLINIC_OR_DEPARTMENT_OTHER): Payer: Self-pay | Admitting: Emergency Medicine

## 2022-01-09 ENCOUNTER — Emergency Department (HOSPITAL_BASED_OUTPATIENT_CLINIC_OR_DEPARTMENT_OTHER): Payer: No Typology Code available for payment source | Admitting: Radiology

## 2022-01-09 DIAGNOSIS — M5442 Lumbago with sciatica, left side: Secondary | ICD-10-CM | POA: Insufficient documentation

## 2022-01-09 DIAGNOSIS — Z794 Long term (current) use of insulin: Secondary | ICD-10-CM | POA: Insufficient documentation

## 2022-01-09 DIAGNOSIS — I10 Essential (primary) hypertension: Secondary | ICD-10-CM | POA: Diagnosis not present

## 2022-01-09 DIAGNOSIS — Z7982 Long term (current) use of aspirin: Secondary | ICD-10-CM | POA: Insufficient documentation

## 2022-01-09 DIAGNOSIS — Z79899 Other long term (current) drug therapy: Secondary | ICD-10-CM | POA: Diagnosis not present

## 2022-01-09 DIAGNOSIS — E1143 Type 2 diabetes mellitus with diabetic autonomic (poly)neuropathy: Secondary | ICD-10-CM | POA: Insufficient documentation

## 2022-01-09 DIAGNOSIS — M549 Dorsalgia, unspecified: Secondary | ICD-10-CM | POA: Diagnosis present

## 2022-01-09 MED ORDER — PREDNISONE 10 MG (21) PO TBPK: ORAL_TABLET | Freq: Every day | ORAL | 0 refills | Status: DC

## 2022-01-09 MED ORDER — OXYCODONE-ACETAMINOPHEN 5-325 MG PO TABS: 1.0000 | ORAL_TABLET | Freq: Four times a day (QID) | ORAL | 0 refills | Status: DC | PRN

## 2022-01-09 MED ORDER — LIDOCAINE 5 % EX PTCH: 1.0000 | MEDICATED_PATCH | CUTANEOUS | 0 refills | Status: DC

## 2022-01-09 MED ORDER — LIDOCAINE 5 % EX PTCH
1.0000 | MEDICATED_PATCH | CUTANEOUS | Status: DC
  Administered 2022-01-09: 1 via TRANSDERMAL
  Filled 2022-01-09: qty 1

## 2022-01-09 MED ORDER — OXYCODONE-ACETAMINOPHEN 5-325 MG PO TABS
1.0000 | ORAL_TABLET | Freq: Once | ORAL | Status: AC
  Administered 2022-01-09: 1 via ORAL
  Filled 2022-01-09: qty 1

## 2022-01-09 NOTE — ED Provider Notes (Cosign Needed Addendum)
[facility] EMERGENCY DEPT Provider Note   CSN: [id number] Arrival date & time: 01/09/22  1010     History  Chief Complaint  Patient presents with   Hip Pain    CORIE ALLIS is a 61 y.o. female history of hypertension, diabetes, gastroparesis per NSTEMI here for evaluation of back pain.  Began on Sunday.  Starts at her left lower back into her gluteus and goes down to her leg.  No numbness or weakness.  Did note that she recently started running up hills as an exercise program.  Walks about around park twice daily.  No fever, history of IVDU, bowel or bladder incontinence, saddle paresthesias.  No swelling to extremities.  No recent falls or injuries. Has appointment with  Dr. Gardenia Phlegm tomorrow for evaluation with sports medicine whom she follows for knee pain  HPI     Home Medications Prior to Admission medications   Medication Sig Start Date End Date Taking? Authorizing Provider  lidocaine (LIDODERM) 5 % Place 1 patch onto the skin daily. Remove & Discard patch within 12 hours or as directed by MD 01/09/22  Yes ,  A, PA-C  oxyCODONE-acetaminophen (PERCOCET/ROXICET) 5-325 MG tablet Take 1 tablet by mouth every 6 (six) hours as needed for severe pain. 01/09/22  Yes ,  A, PA-C  predniSONE (STERAPRED UNI-PAK 21 TAB) 10 MG (21) TBPK tablet Take by mouth daily. Take 6 tabs by mouth daily  for 1 days, then 5 tabs for 1 days, then 4 tabs for 1 days, then 3 tabs for 1 days, 2 tabs for 1 days, then 1 tab by mouth daily for 1 days 01/09/22  Yes ,  A, PA-C  acetaminophen (TYLENOL) 500 MG tablet Take 1,000 mg by mouth every 6 (six) hours as needed for mild pain or headache.    [provider]  aspirin EC 81 MG tablet Take 81 mg by mouth daily. Swallow whole.    [provider]  Blood Glucose Monitoring Suppl (ONETOUCH VERIO IQ SYSTEM) W/DEVICE KIT 1 Act by Does not apply route 3 (three) times daily. 04/06/14   [name], MD  budesonide-formoterol (SYMBICORT) 80-4.5 MCG/ACT inhaler Inhale 2 puffs into the lungs 2 (two) times daily. 04/26/21   [name], MD  calcium carbonate (OS-CAL - DOSED IN MG OF ELEMENTAL CALCIUM) 1250 (500 Ca) MG tablet Take 2 tablets by mouth daily with breakfast.    [provider]  carvedilol (COREG) 25 MG tablet Take 1 tablet by mouth twice daily 12/10/21   Josue Hector, MD  cholecalciferol (VITAMIN D3) 25 MCG (1000 UNIT) tablet Take 2,000 Units by mouth daily.    [provider]  clonazePAM (KLONOPIN) 1 MG tablet Take 1 tablet (1 mg total) by mouth 2 (two) times daily as needed for anxiety. 06/15/21   [name], MD  EPINEPHrine 0.3 mg/0.3 mL IJ SOAJ injection Inject 0.3 mg into the muscle as needed for anaphylaxis. 06/01/21   Margie Billet, PA-C  fenofibrate (TRICOR) 145 MG tablet Take 1 tablet by mouth once daily 07/10/21   Josue Hector, MD  fluticasone [facility]) 50 MCG/ACT nasal spray Place 1 spray into both nostrils daily as needed for allergies or rhinitis.    [provider]  glucose blood (ONE TOUCH ULTRA TEST) test strip Use to test blood sugar three times a week and prn if having symptoms of low blood sugar Dx: 250.92 90 day supply 05/28/13   Norins, Heinz Knuckles,  MD  hydrALAZINE (APRESOLINE) 50 MG tablet TAKE 1 TABLET BY MOUTH THREE TIMES DAILY 12/10/21   Josue Hector, MD  Insulin Pen Needle (BD PEN NEEDLE MICRO U/F) 32G X 6 MM MISC Inject 1 Units into the skin once a week. 01/01/21   Josue Hector, MD  iron polysaccharides (NIFEREX) 150 MG capsule Take 150 mg by mouth daily.    [provider]  isosorbide dinitrate (ISORDIL) 20 MG tablet Take 1 tablet (20 mg total) by mouth 3 (three) times daily. 05/08/21   Josue Hector, MD  levocetirizine (XYZAL) 5 MG tablet Take 5 mg by mouth every evening.    [provider]  LINZESS 145 MCG CAPS capsule TAKE 1 CAPSULE BY MOUTH ONCE DAILY BEFORE BREAKFAST 12/08/21   [name], MD  Multiple Vitamins-Minerals (HAIR SKIN AND NAILS FORMULA) TABS Take 1 tablet by mouth daily.    [provider]  omega-3 acid ethyl esters (LOVAZA) 1 g capsule Take 2 capsules by mouth twice daily 08/13/21   Josue Hector, MD  omeprazole (PRILOSEC) 40 MG capsule TAKE 1 CAPSULE BY MOUTH TWICE DAILY AT  8  AM  AND  10  PM 11/10/21   [name], MD  rosuvastatin (CRESTOR) 20 MG tablet Take 1 tablet (20 mg total) by mouth daily. 06/07/21   Josue Hector, MD  sacubitril-valsartan (ENTRESTO) 97-103 MG Take 1 tablet by mouth 2 (two) times daily. 12/21/20   Josue Hector, MD  Semaglutide, 2 MG/DOSE, (OZEMPIC, 2 MG/DOSE,) 8 MG/3ML SOPN Inject 2 mg into the skin once a week. Patient taking differently: Inject 2 mg into the skin once a week. Friday's 05/03/21   Josue Hector, MD  spironolactone (ALDACTONE) 25 MG tablet Take 1 tablet by mouth once daily 11/12/21   Josue Hector, MD  zinc gluconate 50 MG tablet Take 50 mg by mouth daily.    [provider]      Allergies    Invokana [canagliflozin], Codeine, Doxycycline, Flexeril [cyclobenzaprine], Morphine and related, Reglan [metoclopramide], and Silicon    Review of Systems   Review of Systems  Constitutional: Negative.   HENT: Negative.    Respiratory: Negative.    Cardiovascular: Negative.   Gastrointestinal: Negative.   Genitourinary: Negative.   Musculoskeletal:  Positive for back pain. Negative for arthralgias, gait problem, joint swelling, myalgias, neck pain and neck stiffness.  Skin: Negative.   Neurological: Negative.   All other systems reviewed and are negative.   Physical Exam Updated Vital Signs BP 135/81   Pulse 84   Temp 97.8 F (36.6 C)   Resp 16   Ht _0  (1.626 m)   Wt 67.6 kg   SpO2 100%   BMI 25.58 kg/m  Physical Exam Physical Exam  Constitutional: Pt appears well-developed and well-nourished. No distress.  HENT:  Head: Normocephalic and atraumatic.  Mouth/Throat: Oropharynx is  clear and moist. No oropharyngeal exudate.  Eyes: Conjunctivae are normal.  Neck: Normal range of motion. Neck supple.  Full ROM without pain  Cardiovascular: Normal rate, regular rhythm and intact distal pulses.   Pulmonary/Chest: Effort normal and breath sounds normal. No respiratory distress. Pt has no wheezes.  Abdominal: Soft. Pt exhibits no distension. There is no tenderness, rebound or guarding. No abd bruit or pulsatile mass Musculoskeletal:  Full range of motion of the T-spine and L-spine with flexion, hyperextension, and lateral flexion. No midline tenderness or stepoffs. No tenderness to palpation of the spinous processes  of the T-spine or L-spine. Mild tenderness to palpation of the paraspinous muscles of the L-spine on Left . Positive straight leg raise on left Shortening rotation of legs.  Pelvis stable, nontender palpation.  Tenderness to left piriformis.  No overlying skin changes to the left hip or left lower extremity.  No bony tenderness to femur, knee, tib-fib Neurological: Pt is alert. Marland Kitchen  Speech is clear and goal oriented, follows commands Equal strength bil Sensation normal to light and sharp touch Moves extremities without ataxia, coordination intact Normal gait Normal balance Skin: Skin is warm and dry. No rash noted or lesions noted. Pt is not diaphoretic. No erythema, ecchymosis,edema or warmth.  Psychiatric: Pt has a normal mood and affect. Behavior is normal.  Nursing note and vitals reviewed.  ED Results / Procedures / Treatments   Labs (all labs ordered are listed, but only abnormal results are displayed) Labs Reviewed - No data to display  EKG None  Radiology DG Hip Unilat W or Wo Pelvis 2-3 Views Left  Result Date: 01/09/2022 CLINICAL DATA:  Low back pain and left hip pain. EXAM: DG HIP (WITH OR WITHOUT PELVIS) 2-3V LEFT COMPARISON:  None Available. FINDINGS: There is no evidence of hip fracture or dislocation. There is no evidence of arthropathy or  other focal bone abnormality. IMPRESSION: Negative. Electronically Signed   By: Franchot Gallo M.D.   On: 01/09/2022 14:05   DG Lumbar Spine Complete  Result Date: 01/09/2022 CLINICAL DATA:  Low back pain EXAM: LUMBAR SPINE - COMPLETE 4+ VIEW COMPARISON:  None Available. FINDINGS: There is no evidence of lumbar spine fracture. Alignment is normal. Intervertebral disc spaces are maintained. Mild atherosclerotic aorta. IMPRESSION: Negative. Electronically Signed   By: Franchot Gallo M.D.   On: 01/09/2022 14:04    Procedures Procedures    Medications Ordered in ED Medications  lidocaine (LIDODERM) 5 % 1 patch (1 patch Transdermal Patch Applied 01/09/22 1547)  oxyCODONE-acetaminophen (PERCOCET/ROXICET) 5-325 MG per tablet 1 tablet (1 tablet Oral Given 01/09/22 1548)    ED Course/ Medical Decision Making/ A&P    61 year old here for evaluation of left lower back pain with extension into the leg began approximately 4 days ago.  Worse with movement.  No recent falls or injuries.  Did note that she follows with sports medicine and she has been increasing her exercise, walking around [employer] twice daily.  Does note that she runs up the hills to help with exercise.  Her pain is reproducible on exam to her left piriformis.  She has no bony tenderness to her lower extremity, no shortening rotation of legs.  Low suspicion for occult fracture, dislocation.  No overlying skin changes to suggest gout, septic joint, cellulitis.  Her compartments are soft.  No clinical evidence of VTE.  No history of IVDU, fever, bowel or bladder incontinence, saddle paresthesia.  I have low suspicion for acute neurosurgical emergency such as cauda equina, discitis, osteomyelitis with transverse myelitis, psoas abscess.  Suspect likely sciatica/piriformis syndrome as cause of her pain.  Will treat symptomatically.  Of note she is diabetic however states her A1c has been in the low 5's.  I discussed with patient she will  need close monitoring of her blood sugars.  If they become elevated she needs to stop the steroids.  She does have sports medicine follow-up tomorrow which I encouraged her to keep. Ambulatory NV intact here.  Imaging personally viewed and interpreted:  X-ray lumbar spine, left hip without acute fracture, dislocation, effusion  The patient has been appropriately medically screened and/or stabilized in the ED. I have low suspicion for any other emergent medical condition which would require further screening, evaluation or treatment in the ED or require inpatient management.  Patient is hemodynamically stable and in no acute distress.  Patient able to ambulate in department prior to ED.  Evaluation does not show acute pathology that would require ongoing or additional emergent interventions while in the emergency department or further inpatient treatment.  I have discussed the diagnosis with the patient and answered all questions.  Pain is been managed while in the emergency department and patient has no further complaints prior to discharge.  Patient is comfortable with plan discussed in room and is stable for discharge at this time.  I have discussed strict return precautions for returning to the emergency department.  Patient was encouraged to follow-up with PCP/specialist refer to at discharge.                           Medical Decision Making Amount and/or Complexity of Data Reviewed External Data Reviewed: radiology and notes. Radiology: ordered and independent interpretation performed. Decision-making details documented in ED Course.  Risk OTC drugs. Prescription drug management. Diagnosis or treatment significantly limited by social determinants of health.     Final Clinical Impression(s) / ED Diagnoses Final diagnoses:  Acute left-sided low back pain with left-sided sciatica    Rx / DC Orders ED Discharge Orders          Ordered    predniSONE (STERAPRED UNI-PAK 21 TAB) 10 MG  (21) TBPK tablet  Daily        01/09/22 1549    oxyCODONE-acetaminophen (PERCOCET/ROXICET) 5-325 MG tablet  Every 6 hours PRN        01/09/22 1549    lidocaine (LIDODERM) 5 %  Every 24 hours        01/09/22 1549                ,  A, PA-C 01/09/22 1551    Tegeler, Gwenyth Allegra, MD 01/10/22 2136

## 2022-01-09 NOTE — ED Triage Notes (Signed)
Pt arrived POV, ambulatory. Pt caox4. Pt c/o L lower back pain that goes down the hip and down the L leg. Pain started Saturday and has worsened and worsens on movement. Denies recent injury.

## 2022-01-09 NOTE — ED Notes (Signed)
Discharge instructions, follow up care, and prescriptions reviewed and explained, pt verbalized understanding. Pt caox4 and ambulatory on d/c.  

## 2022-01-09 NOTE — Discharge Instructions (Addendum)
Keep your follow-up appointment with Dr. Tamala Julian  Take the medications as prescribed.  Please use caution as the oxycodone is a narcotic prescription does have the addictive potential.  Do not drive or operate heavy machinery while taking this medication.  I have also prescribed you prednisone.  Please make sure to monitor your blood sugars at home.  If greater than 250 need to stop taking the steroids.  Return for new or worsening symptoms.
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
## 2022-01-10 ENCOUNTER — Ambulatory Visit (INDEPENDENT_AMBULATORY_CARE_PROVIDER_SITE_OTHER): Payer: No Typology Code available for payment source | Admitting: Family Medicine

## 2022-01-10 VITALS — BP 130/90 | HR 83 | Ht 64.0 in | Wt 152.0 lb

## 2022-01-10 DIAGNOSIS — M62830 Muscle spasm of back: Secondary | ICD-10-CM | POA: Insufficient documentation

## 2022-01-10 DIAGNOSIS — M2550 Pain in unspecified joint: Secondary | ICD-10-CM

## 2022-01-10 MED ORDER — PREDNISONE 20 MG PO TABS: 40.0000 mg | ORAL_TABLET | Freq: Every day | ORAL | 0 refills | Status: DC

## 2022-01-10 MED ORDER — TIZANIDINE HCL 2 MG PO TABS: 2.0000 mg | ORAL_TABLET | Freq: Every evening | ORAL | 0 refills | Status: DC | PRN

## 2022-01-10 MED ORDER — METHYLPREDNISOLONE ACETATE 80 MG/ML IJ SUSP
80.0000 mg | Freq: Once | INTRAMUSCULAR | Status: AC
  Administered 2022-01-10: 80 mg via INTRAMUSCULAR

## 2022-01-10 MED ORDER — KETOROLAC TROMETHAMINE 60 MG/2ML IM SOLN
60.0000 mg | Freq: Once | INTRAMUSCULAR | Status: AC
  Administered 2022-01-10: 60 mg via INTRAMUSCULAR

## 2022-01-10 NOTE — Assessment & Plan Note (Signed)
Patient has what appears to be more of a muscle spasm.  X-rays have been fairly unremarkable.  Patient has no true radicular symptoms but difficult to do secondary to some voluntary guarding.  Toradol and Depo-Medrol injections given today.  Short course of prednisone for 5 days, patient is a diabetic and knows to monitor blood sugar.  Last A1c that was 5.6.  Patient also given a small dose of Zanaflex to help with nighttime pain.  Follow-up again in 4 weeks

## 2022-01-10 NOTE — Patient Instructions (Addendum)
Prednisone '40mg'$  5 days Zanaflex '2mg'$  at night when needed

## 2022-01-15 ENCOUNTER — Ambulatory Visit (INDEPENDENT_AMBULATORY_CARE_PROVIDER_SITE_OTHER): Payer: No Typology Code available for payment source | Admitting: Internal Medicine

## 2022-01-15 ENCOUNTER — Encounter: Payer: Self-pay | Admitting: Internal Medicine

## 2022-01-15 VITALS — BP 136/82 | HR 89 | Temp 98.3°F | Ht 64.0 in | Wt 155.0 lb

## 2022-01-15 DIAGNOSIS — Z23 Encounter for immunization: Secondary | ICD-10-CM

## 2022-01-15 DIAGNOSIS — D538 Other specified nutritional anemias: Secondary | ICD-10-CM | POA: Diagnosis not present

## 2022-01-15 DIAGNOSIS — E118 Type 2 diabetes mellitus with unspecified complications: Secondary | ICD-10-CM | POA: Diagnosis not present

## 2022-01-15 DIAGNOSIS — D638 Anemia in other chronic diseases classified elsewhere: Secondary | ICD-10-CM | POA: Diagnosis not present

## 2022-01-15 DIAGNOSIS — F418 Other specified anxiety disorders: Secondary | ICD-10-CM

## 2022-01-15 DIAGNOSIS — Z0000 Encounter for general adult medical examination without abnormal findings: Secondary | ICD-10-CM

## 2022-01-15 DIAGNOSIS — F411 Generalized anxiety disorder: Secondary | ICD-10-CM

## 2022-01-15 DIAGNOSIS — Z1231 Encounter for screening mammogram for malignant neoplasm of breast: Secondary | ICD-10-CM

## 2022-01-15 DIAGNOSIS — I10 Essential (primary) hypertension: Secondary | ICD-10-CM

## 2022-01-15 DIAGNOSIS — E519 Thiamine deficiency, unspecified: Secondary | ICD-10-CM | POA: Diagnosis not present

## 2022-01-15 DIAGNOSIS — D520 Dietary folate deficiency anemia: Secondary | ICD-10-CM

## 2022-01-15 DIAGNOSIS — F410 Panic disorder [episodic paroxysmal anxiety] without agoraphobia: Secondary | ICD-10-CM

## 2022-01-15 DIAGNOSIS — F5105 Insomnia due to other mental disorder: Secondary | ICD-10-CM

## 2022-01-15 NOTE — Patient Instructions (Signed)

## 2022-01-15 NOTE — Progress Notes (Signed)
Subjective:  Patient ID: Carolyn [name], female    DOB: August 07, 1960  Age: 61 y.o. MRN: [id number]  CC: Annual Exam, Anemia, Hypertension, and Diabetes   HPI [name] presents for a CPX and f/up ---  She complains of anxiety and requests a refill of klonopin. She exercises and denies dizziness, lightheadedness, DOE, CP, or SOB.  Outpatient Medications Prior to Visit  Medication Sig Dispense Refill   acetaminophen (TYLENOL) 500 MG tablet Take 1,000 mg by mouth every 6 (six) hours as needed for mild pain or headache.     aspirin EC 81 MG tablet Take 81 mg by mouth daily. Swallow whole.     Blood Glucose Monitoring Suppl (ONETOUCH VERIO IQ SYSTEM) W/DEVICE KIT 1 Act by Does not apply route 3 (three) times daily. 2 kit 0   budesonide-formoterol (SYMBICORT) 80-4.5 MCG/ACT inhaler Inhale 2 puffs into the lungs 2 (two) times daily. 3 each 0   calcium carbonate (OS-CAL - DOSED IN MG OF ELEMENTAL CALCIUM) 1250 (500 Ca) MG tablet Take 2 tablets by mouth daily with breakfast.     carvedilol (COREG) 25 MG tablet Take 1 tablet by mouth twice daily 180 tablet 3   cholecalciferol (VITAMIN D3) 25 MCG (1000 UNIT) tablet Take 2,000 Units by mouth daily.     EPINEPHrine 0.3 mg/0.3 mL IJ SOAJ injection Inject 0.3 mg into the muscle as needed for anaphylaxis. 1 each 0   fenofibrate (TRICOR) 145 MG tablet Take 1 tablet by mouth once daily 30 tablet 6   fluticasone (FLONASE) 50 MCG/ACT nasal spray Place 1 spray into both nostrils daily as needed for allergies or rhinitis.     glucose blood (ONE TOUCH ULTRA TEST) test strip Use to test blood sugar three times a week and prn if having symptoms of low blood sugar Dx: 250.92 90 day supply 100 each 11   hydrALAZINE (APRESOLINE) 50 MG tablet TAKE 1 TABLET BY MOUTH THREE TIMES DAILY 270 tablet 3   Insulin Pen Needle (BD PEN NEEDLE MICRO U/F) 32G X 6 MM MISC Inject 1 Units into the skin once a week. 100 each 3   iron polysaccharides (NIFEREX) 150 MG capsule Take  150 mg by mouth daily.     isosorbide dinitrate (ISORDIL) 20 MG tablet Take 1 tablet (20 mg total) by mouth 3 (three) times daily. 270 tablet 3   levocetirizine (XYZAL) 5 MG tablet Take 5 mg by mouth every evening.     lidocaine (LIDODERM) 5 % Place 1 patch onto the skin daily. Remove & Discard patch within 12 hours or as directed by MD 30 patch 0   LINZESS 145 MCG CAPS capsule TAKE 1 CAPSULE BY MOUTH ONCE DAILY BEFORE BREAKFAST 90 capsule 0   Multiple Vitamins-Minerals (HAIR SKIN AND NAILS FORMULA) TABS Take 1 tablet by mouth daily.     omega-3 acid ethyl esters (LOVAZA) 1 g capsule Take 2 capsules by mouth twice daily 360 capsule 3   omeprazole (PRILOSEC) 40 MG capsule TAKE 1 CAPSULE BY MOUTH TWICE DAILY AT  8  AM  AND  10  PM 180 capsule 0   rosuvastatin (CRESTOR) 20 MG tablet Take 1 tablet (20 mg total) by mouth daily. 90 tablet 3   sacubitril-valsartan (ENTRESTO) 97-103 MG Take 1 tablet by mouth 2 (two) times daily. 60 tablet 11   Semaglutide, 2 MG/DOSE, (OZEMPIC, 2 MG/DOSE,) 8 MG/3ML SOPN Inject 2 mg into the skin once a week. (Patient taking differently: Inject 2 mg into the  skin once a week. Friday's) 3 mL 11   spironolactone (ALDACTONE) 25 MG tablet Take 1 tablet by mouth once daily 90 tablet 2   tiZANidine (ZANAFLEX) 2 MG tablet Take 1 tablet (2 mg total) by mouth at bedtime as needed for muscle spasms. 30 tablet 0   clonazePAM (KLONOPIN) 1 MG tablet Take 1 tablet (1 mg total) by mouth 2 (two) times daily as needed for anxiety. 180 tablet 0   oxyCODONE-acetaminophen (PERCOCET/ROXICET) 5-325 MG tablet Take 1 tablet by mouth every 6 (six) hours as needed for severe pain. 15 tablet 0   predniSONE (DELTASONE) 20 MG tablet Take 2 tablets (40 mg total) by mouth daily with breakfast. 10 tablet 0   predniSONE (STERAPRED UNI-PAK 21 TAB) 10 MG (21) TBPK tablet Take by mouth daily. Take 6 tabs by mouth daily  for 1 days, then 5 tabs for 1 days, then 4 tabs for 1 days, then 3 tabs for 1 days, 2 tabs  for 1 days, then 1 tab by mouth daily for 1 days 21 tablet 0   zinc gluconate 50 MG tablet Take 50 mg by mouth daily.     No facility-administered medications prior to visit.    ROS Review of Systems  Constitutional:  Negative for chills, diaphoresis, fatigue and fever.  HENT: Negative.    Eyes: Negative.   Respiratory:  Negative for cough, chest tightness, shortness of breath and wheezing.   Cardiovascular:  Negative for chest pain and palpitations.  Gastrointestinal: Negative.  Negative for abdominal pain, constipation, diarrhea and vomiting.  Endocrine: Negative.   Genitourinary: Negative.   Musculoskeletal: Negative.  Negative for myalgias.  Skin: Negative.   Neurological: Negative.  Negative for dizziness, weakness, light-headedness and numbness.  Hematological:  Negative for adenopathy. Does not bruise/bleed easily.  Psychiatric/Behavioral:  Negative for dysphoric mood and sleep disturbance. The patient is nervous/anxious.     Objective:  BP 136/82 (BP Location: Left Arm, Patient Position: Sitting, Cuff Size: Large)   Pulse 89   Temp 98.3 F (36.8 C) (Oral)   Ht '5\' 4"'  (1.626 m)   Wt 155 lb (70.3 kg)   SpO2 90%   BMI 26.61 kg/m   BP Readings from Last 3 Encounters:  01/15/22 136/82  01/10/22 (!) 130/90  01/09/22 133/86    Wt Readings from Last 3 Encounters:  01/15/22 155 lb (70.3 kg)  01/10/22 152 lb (68.9 kg)  01/09/22 149 lb (67.6 kg)    Physical Exam Vitals reviewed.  Constitutional:      Appearance: Normal appearance.  HENT:     Mouth/Throat:     Mouth: Mucous membranes are moist.  Eyes:     General: No scleral icterus.    Conjunctiva/sclera: Conjunctivae normal.  Cardiovascular:     Rate and Rhythm: Normal rate and regular rhythm.     Heart sounds: No murmur heard. Pulmonary:     Effort: Pulmonary effort is normal.     Breath sounds: No stridor. No wheezing, rhonchi or rales.  Abdominal:     General: Abdomen is flat.     Palpations: There is  no mass.     Tenderness: There is no abdominal tenderness. There is no guarding or rebound.     Hernia: No hernia is present.  Musculoskeletal:        General: Normal range of motion.     Cervical back: Neck supple.     Right lower leg: No edema.     Left lower leg: No edema.  Lymphadenopathy:     Cervical: No cervical adenopathy.  Skin:    General: Skin is warm and dry.     Coloration: Skin is pale.  Neurological:     General: No focal deficit present.     Mental Status: She is alert. Mental status is at baseline.  Psychiatric:        Mood and Affect: Mood normal.        Behavior: Behavior normal.     Lab Results  Component Value Date   WBC 7.2 01/15/2022   HGB 10.5 (L) 01/15/2022   HCT 30.9 (L) 01/15/2022   PLT 287.0 01/15/2022   GLUCOSE 155 (H) 01/15/2022   CHOL 127 08/16/2021   TRIG 83 08/16/2021   HDL 39 (L) 08/16/2021   LDLDIRECT 84 03/16/2021   LDLCALC 72 08/16/2021   ALT 16 06/01/2021   AST 17 06/01/2021   NA 139 01/15/2022   K 4.3 01/15/2022   CL 105 01/15/2022   CREATININE 0.96 01/15/2022   BUN 22 01/15/2022   CO2 28 01/15/2022   TSH 0.63 01/15/2022   INR 1.04 01/14/2013   HGBA1C 5.9 01/15/2022   MICROALBUR 19.5 (H) 01/15/2022    DG Hip Unilat W or Wo Pelvis 2-3 Views Left  Result Date: 01/09/2022 CLINICAL DATA:  Low back pain and left hip pain. EXAM: DG HIP (WITH OR WITHOUT PELVIS) 2-3V LEFT COMPARISON:  None Available. FINDINGS: There is no evidence of hip fracture or dislocation. There is no evidence of arthropathy or other focal bone abnormality. IMPRESSION: Negative. Electronically Signed   By: Franchot Gallo M.D.   On: 01/09/2022 14:05   DG Lumbar Spine Complete  Result Date: 01/09/2022 CLINICAL DATA:  Low back pain EXAM: LUMBAR SPINE - COMPLETE 4+ VIEW COMPARISON:  None Available. FINDINGS: There is no evidence of lumbar spine fracture. Alignment is normal. Intervertebral disc spaces are maintained. Mild atherosclerotic aorta. IMPRESSION:  Negative. Electronically Signed   By: Franchot Gallo M.D.   On: 01/09/2022 14:04    Assessment & Plan:   Flora was seen today for annual exam, anemia, hypertension and diabetes.  Diagnoses and all orders for this visit:  Essential hypertension- Her BP is well controlled. -     Basic metabolic panel; Future -     TSH; Future -     Urinalysis, Routine w reflex microscopic; Future -     Urinalysis, Routine w reflex microscopic -     TSH -     Basic metabolic panel  Type II diabetes mellitus with manifestations ([place])- Her blood sugar is well controlled. -     Basic metabolic panel; Future -     Hemoglobin A1c; Future -     Microalbumin / creatinine urine ratio; Future -     Urinalysis, Routine w reflex microscopic; Future -     Urinalysis, Routine w reflex microscopic -     Microalbumin / creatinine urine ratio -     Hemoglobin A1c -     Basic metabolic panel  Anemia due to acquired thiamine deficiency -     CBC with Differential/Platelet; Future -     Zinc; Future -     Zinc -     CBC with Differential/Platelet  Anemia in other chronic diseases classified elsewhere -     IBC + Ferritin; Future -     Vitamin B12; Future -     CBC with Differential/Platelet; Future -     Folate; Future -  Vitamin B1; Future -     Zinc; Future -     Zinc -     Vitamin B1 -     Folate -     CBC with Differential/Platelet -     Vitamin B12 -     IBC + Ferritin  Generalized anxiety disorder with panic attacks -     clonazePAM (KLONOPIN) 1 MG tablet; Take 1 tablet (1 mg total) by mouth 2 (two) times daily as needed for anxiety.  Visit for screening mammogram -     MM DIGITAL SCREENING BILATERAL; Future  Routine general medical examination at a health care facility- Exam completed, labs reviewed, vaccines updated, cancer screenings addressed, pt ed was given.  Insomnia secondary to depression with anxiety  Dietary folate deficiency anemia -     folic acid (FOLVITE) 1 MG tablet; Take  1 tablet (1 mg total) by mouth daily.  Anemia due to zinc deficiency -     zinc gluconate 50 MG tablet; Take 1 tablet (50 mg total) by mouth daily.  Other orders -     Flu Vaccine QUAD 6+ mos PF IM (Fluarix Quad PF)   I have discontinued Rakhi H. Ogle's predniSONE, oxyCODONE-acetaminophen, and predniSONE. I have also changed her zinc gluconate. Additionally, I am having her start on folic acid. Lastly, I am having her maintain her glucose blood, OneTouch Verio IQ System, cholecalciferol, Hair Skin and Nails Formula, calcium carbonate, iron polysaccharides, Entresto, BD Pen Needle Micro U/F, levocetirizine, budesonide-formoterol, Ozempic (2 MG/DOSE), isosorbide dinitrate, aspirin EC, acetaminophen, fluticasone, EPINEPHrine, rosuvastatin, fenofibrate, omega-3 acid ethyl esters, omeprazole, spironolactone, carvedilol, Linzess, hydrALAZINE, lidocaine, tiZANidine, and clonazePAM.  Meds ordered this encounter  Medications   folic acid (FOLVITE) 1 MG tablet    Sig: Take 1 tablet (1 mg total) by mouth daily.    Dispense:  90 tablet    Refill:  1   clonazePAM (KLONOPIN) 1 MG tablet    Sig: Take 1 tablet (1 mg total) by mouth 2 (two) times daily as needed for anxiety.    Dispense:  180 tablet    Refill:  0   zinc gluconate 50 MG tablet    Sig: Take 1 tablet (50 mg total) by mouth daily.    Dispense:  90 tablet    Refill:  1     Follow-up: Return in about 6 months (around 07/17/2022).  Scarlette Calico, MD

## 2022-01-16 ENCOUNTER — Other Ambulatory Visit: Payer: Self-pay | Admitting: Internal Medicine

## 2022-01-16 DIAGNOSIS — D520 Dietary folate deficiency anemia: Secondary | ICD-10-CM | POA: Insufficient documentation

## 2022-01-16 DIAGNOSIS — F410 Panic disorder [episodic paroxysmal anxiety] without agoraphobia: Secondary | ICD-10-CM

## 2022-01-16 LAB — URINALYSIS, ROUTINE W REFLEX MICROSCOPIC
Bilirubin Urine: NEGATIVE
Hgb urine dipstick: NEGATIVE
Nitrite: NEGATIVE
Specific Gravity, Urine: 1.02 (ref 1.000–1.030)
Total Protein, Urine: 30 — AB
Urine Glucose: NEGATIVE
Urobilinogen, UA: 1 (ref 0.0–1.0)
pH: 6.5 (ref 5.0–8.0)

## 2022-01-16 LAB — IBC + FERRITIN
Ferritin: 129.6 ng/mL (ref 10.0–291.0)
Iron: 80 ug/dL (ref 42–145)
Saturation Ratios: 20.6 % (ref 20.0–50.0)
TIBC: 389.2 ug/dL (ref 250.0–450.0)
Transferrin: 278 mg/dL (ref 212.0–360.0)

## 2022-01-16 LAB — VITAMIN B12: Vitamin B-12: 256 pg/mL (ref 211–911)

## 2022-01-16 LAB — CBC WITH DIFFERENTIAL/PLATELET
Basophils Absolute: 0 10*3/uL (ref 0.0–0.1)
Basophils Relative: 0.4 % (ref 0.0–3.0)
Eosinophils Absolute: 0 10*3/uL (ref 0.0–0.7)
Eosinophils Relative: 0.1 % (ref 0.0–5.0)
HCT: 30.9 % — ABNORMAL LOW (ref 36.0–46.0)
Hemoglobin: 10.5 g/dL — ABNORMAL LOW (ref 12.0–15.0)
Lymphocytes Relative: 17 % (ref 12.0–46.0)
Lymphs Abs: 1.2 10*3/uL (ref 0.7–4.0)
MCHC: 34.1 g/dL (ref 30.0–36.0)
MCV: 91.6 fl (ref 78.0–100.0)
Monocytes Absolute: 0.3 10*3/uL (ref 0.1–1.0)
Monocytes Relative: 4 % (ref 3.0–12.0)
Neutro Abs: 5.6 10*3/uL (ref 1.4–7.7)
Neutrophils Relative %: 78.5 % — ABNORMAL HIGH (ref 43.0–77.0)
Platelets: 287 10*3/uL (ref 150.0–400.0)
RBC: 3.37 Mil/uL — ABNORMAL LOW (ref 3.87–5.11)
RDW: 13 % (ref 11.5–15.5)
WBC: 7.2 10*3/uL (ref 4.0–10.5)

## 2022-01-16 LAB — TSH: TSH: 0.63 u[IU]/mL (ref 0.35–5.50)

## 2022-01-16 LAB — BASIC METABOLIC PANEL
BUN: 22 mg/dL (ref 6–23)
CO2: 28 mEq/L (ref 19–32)
Calcium: 9.4 mg/dL (ref 8.4–10.5)
Chloride: 105 mEq/L (ref 96–112)
Creatinine, Ser: 0.96 mg/dL (ref 0.40–1.20)
GFR: 64.58 mL/min (ref >60.00)
Glucose, Bld: 155 mg/dL — ABNORMAL HIGH (ref 70–99)
Potassium: 4.3 mEq/L (ref 3.5–5.1)
Sodium: 139 mEq/L (ref 135–145)

## 2022-01-16 LAB — MICROALBUMIN / CREATININE URINE RATIO
Creatinine,U: 190.7 mg/dL
Microalb Creat Ratio: 10.2 mg/g (ref 0.0–30.0)
Microalb, Ur: 19.5 mg/dL — ABNORMAL HIGH (ref 0.0–1.9)

## 2022-01-16 LAB — FOLATE: Folate: 4 ng/mL — ABNORMAL LOW (ref >5.9)

## 2022-01-16 LAB — HEMOGLOBIN A1C: Hgb A1c MFr Bld: 5.9 % (ref 4.6–6.5)

## 2022-01-16 MED ORDER — FOLIC ACID 1 MG PO TABS: 1.0000 mg | ORAL_TABLET | Freq: Every day | ORAL | 1 refills | Status: DC

## 2022-01-17 MED ORDER — CLONAZEPAM 1 MG PO TABS: 1.0000 mg | ORAL_TABLET | Freq: Two times a day (BID) | ORAL | 0 refills | Status: DC | PRN

## 2022-01-18 LAB — ZINC: Zinc: 56 ug/dL — ABNORMAL LOW (ref 60–130)

## 2022-01-18 LAB — VITAMIN B1: Vitamin B1 (Thiamine): 8 nmol/L (ref 8–30)

## 2022-01-19 DIAGNOSIS — D538 Other specified nutritional anemias: Secondary | ICD-10-CM | POA: Insufficient documentation

## 2022-01-19 MED ORDER — ZINC GLUCONATE 50 MG PO TABS: 50.0000 mg | ORAL_TABLET | Freq: Every day | ORAL | 1 refills | Status: DC

## 2022-01-20 ENCOUNTER — Other Ambulatory Visit: Payer: Self-pay | Admitting: Cardiovascular Disease

## 2022-01-21 ENCOUNTER — Encounter: Payer: Self-pay | Admitting: Pharmacist

## 2022-01-21 ENCOUNTER — Encounter: Payer: Self-pay | Admitting: Cardiovascular Disease

## 2022-02-06 ENCOUNTER — Other Ambulatory Visit: Payer: Self-pay | Admitting: Internal Medicine

## 2022-02-06 ENCOUNTER — Other Ambulatory Visit: Payer: Self-pay | Admitting: Cardiovascular Disease

## 2022-02-06 ENCOUNTER — Encounter: Payer: Self-pay | Admitting: Cardiovascular Disease

## 2022-02-06 DIAGNOSIS — K2100 Gastro-esophageal reflux disease with esophagitis, without bleeding: Secondary | ICD-10-CM

## 2022-02-06 MED ORDER — FENOFIBRATE 145 MG PO TABS: 145.0000 mg | ORAL_TABLET | Freq: Every day | ORAL | 11 refills | Status: DC

## 2022-02-11 ENCOUNTER — Encounter: Payer: Self-pay | Admitting: Internal Medicine

## 2022-02-13 ENCOUNTER — Encounter: Payer: Self-pay | Admitting: Internal Medicine

## 2022-02-13 ENCOUNTER — Ambulatory Visit (INDEPENDENT_AMBULATORY_CARE_PROVIDER_SITE_OTHER): Payer: No Typology Code available for payment source | Admitting: Internal Medicine

## 2022-02-13 VITALS — BP 118/78 | HR 82 | Temp 98.2°F | Ht 64.0 in | Wt 149.0 lb

## 2022-02-13 DIAGNOSIS — N3000 Acute cystitis without hematuria: Secondary | ICD-10-CM | POA: Diagnosis not present

## 2022-02-13 DIAGNOSIS — R300 Dysuria: Secondary | ICD-10-CM | POA: Insufficient documentation

## 2022-02-13 LAB — URINALYSIS, ROUTINE W REFLEX MICROSCOPIC
Bilirubin Urine: NEGATIVE
Hgb urine dipstick: NEGATIVE
Nitrite: NEGATIVE
Specific Gravity, Urine: >=1.03 — AB (ref 1.000–1.030)
Total Protein, Urine: 100 — AB
Urine Glucose: NEGATIVE
Urobilinogen, UA: 0.2 (ref 0.0–1.0)
pH: 5.5 (ref 5.0–8.0)

## 2022-02-13 MED ORDER — FLUCONAZOLE 150 MG PO TABS: 150.0000 mg | ORAL_TABLET | Freq: Once | ORAL | 3 refills | Status: AC

## 2022-02-13 MED ORDER — NITROFURANTOIN MONOHYD MACRO 100 MG PO CAPS: 100.0000 mg | ORAL_CAPSULE | Freq: Two times a day (BID) | ORAL | 0 refills | Status: AC

## 2022-02-13 NOTE — Progress Notes (Unsigned)
Subjective:  Patient ID: Carolyn [name], female    DOB: February 10, 1961  Age: 61 y.o. MRN: [id number]  CC: Urinary Tract Infection   HPI Carolyn [name] presents for f/up -  She complains of a 3-day history of bladder pain, dysuria, and foul-smelling urine.  Outpatient Medications Prior to Visit  Medication Sig Dispense Refill   acetaminophen (TYLENOL) 500 MG tablet Take 1,000 mg by mouth every 6 (six) hours as needed for mild pain or headache.     aspirin EC 81 MG tablet Take 81 mg by mouth daily. Swallow whole.     Blood Glucose Monitoring Suppl (ONETOUCH VERIO IQ SYSTEM) W/DEVICE KIT 1 Act by Does not apply route 3 (three) times daily. 2 kit 0   budesonide-formoterol (SYMBICORT) 80-4.5 MCG/ACT inhaler Inhale 2 puffs into the lungs 2 (two) times daily. 3 each 0   calcium carbonate (OS-CAL - DOSED IN MG OF ELEMENTAL CALCIUM) 1250 (500 Ca) MG tablet Take 2 tablets by mouth daily with breakfast.     carvedilol (COREG) 25 MG tablet Take 1 tablet by mouth twice daily 180 tablet 3   cholecalciferol (VITAMIN D3) 25 MCG (1000 UNIT) tablet Take 2,000 Units by mouth daily.     clonazePAM (KLONOPIN) 1 MG tablet Take 1 tablet (1 mg total) by mouth 2 (two) times daily as needed for anxiety. 180 tablet 0   ENTRESTO 97-103 MG Take 1 tablet by mouth twice daily 60 tablet 8   EPINEPHrine 0.3 mg/0.3 mL IJ SOAJ injection Inject 0.3 mg into the muscle as needed for anaphylaxis. 1 each 0   fenofibrate (TRICOR) 145 MG tablet Take 1 tablet (145 mg total) by mouth daily. 30 tablet 11   fluticasone (FLONASE) 50 MCG/ACT nasal spray Place 1 spray into both nostrils daily as needed for allergies or rhinitis.     folic acid (FOLVITE) 1 MG tablet Take 1 tablet (1 mg total) by mouth daily. 90 tablet 1   glucose blood (ONE TOUCH ULTRA TEST) test strip Use to test blood sugar three times a week and prn if having symptoms of low blood sugar Dx: 250.92 90 day supply 100 each 11   hydrALAZINE (APRESOLINE) 50 MG tablet TAKE 1  TABLET BY MOUTH THREE TIMES DAILY 270 tablet 3   Insulin Pen Needle (BD PEN NEEDLE MICRO U/F) 32G X 6 MM MISC Inject 1 Units into the skin once a week. 100 each 3   iron polysaccharides (NIFEREX) 150 MG capsule Take 150 mg by mouth daily.     isosorbide dinitrate (ISORDIL) 20 MG tablet Take 1 tablet (20 mg total) by mouth 3 (three) times daily. 270 tablet 3   levocetirizine (XYZAL) 5 MG tablet Take 5 mg by mouth every evening.     lidocaine (LIDODERM) 5 % Place 1 patch onto the skin daily. Remove & Discard patch within 12 hours or as directed by MD 30 patch 0   LINZESS 145 MCG CAPS capsule TAKE 1 CAPSULE BY MOUTH ONCE DAILY BEFORE BREAKFAST 90 capsule 0   Multiple Vitamins-Minerals (HAIR SKIN AND NAILS FORMULA) TABS Take 1 tablet by mouth daily.     omega-3 acid ethyl esters (LOVAZA) 1 g capsule Take 2 capsules by mouth twice daily 360 capsule 3   omeprazole (PRILOSEC) 40 MG capsule TAKE 1 CAPSULE BY MOUTH TWICE DAILY AT  8  AM  AND  10  PM 180 capsule 0   rosuvastatin (CRESTOR) 20 MG tablet Take 1 tablet (20 mg total) by mouth  daily. 90 tablet 3   Semaglutide, 2 MG/DOSE, (OZEMPIC, 2 MG/DOSE,) 8 MG/3ML SOPN Inject 2 mg into the skin once a week. (Patient taking differently: Inject 2 mg into the skin once a week. Friday's) 3 mL 11   spironolactone (ALDACTONE) 25 MG tablet Take 1 tablet by mouth once daily 90 tablet 2   tiZANidine (ZANAFLEX) 2 MG tablet Take 1 tablet (2 mg total) by mouth at bedtime as needed for muscle spasms. 30 tablet 0   zinc gluconate 50 MG tablet Take 1 tablet (50 mg total) by mouth daily. 90 tablet 1   No facility-administered medications prior to visit.    ROS Review of Systems  Constitutional:  Negative for chills, diaphoresis, fatigue and fever.  HENT: Negative.    Eyes: Negative.   Respiratory:  Negative for cough, chest tightness, shortness of breath and wheezing.   Cardiovascular:  Negative for chest pain, palpitations and leg swelling.  Gastrointestinal:   Negative for abdominal pain and diarrhea.  Endocrine: Negative.   Genitourinary:  Positive for dysuria. Negative for decreased urine volume, difficulty urinating, flank pain, frequency, hematuria, pelvic pain, urgency, vaginal bleeding, vaginal discharge and vaginal pain.  Musculoskeletal: Negative.   Skin: Negative.   Allergic/Immunologic: Negative.   Neurological: Negative.   Hematological:  Negative for adenopathy. Does not bruise/bleed easily.  Psychiatric/Behavioral: Negative.      Objective:  BP 118/78 (BP Location: Right Arm, Patient Position: Sitting, Cuff Size: Large)   Pulse 82   Temp 98.2 F (36.8 C) (Oral)   Ht _0  (1.626 m)   Wt 149 lb (67.6 kg)   SpO2 93%   BMI 25.58 kg/m   BP Readings from Last 3 Encounters:  02/13/22 118/78  01/15/22 136/82  01/10/22 (!) 130/90    Wt Readings from Last 3 Encounters:  02/13/22 149 lb (67.6 kg)  01/15/22 155 lb (70.3 kg)  01/10/22 152 lb (68.9 kg)    Physical Exam Vitals reviewed.  Constitutional:      Appearance: She is not ill-appearing.  HENT:     Nose: Nose normal.     Mouth/Throat:     Mouth: Mucous membranes are moist.  Eyes:     General: No scleral icterus.    Conjunctiva/sclera: Conjunctivae normal.  Cardiovascular:     Rate and Rhythm: Normal rate and regular rhythm.     Heart sounds: No murmur heard. Pulmonary:     Effort: Pulmonary effort is normal.     Breath sounds: No stridor. No wheezing, rhonchi or rales.  Abdominal:     General: Abdomen is flat.     Palpations: There is no mass.     Tenderness: There is no abdominal tenderness. There is no guarding.     Hernia: No hernia is present.  Musculoskeletal:        General: Normal range of motion.     Cervical back: Neck supple.     Right lower leg: No edema.     Left lower leg: No edema.  Lymphadenopathy:     Cervical: No cervical adenopathy.  Skin:    General: Skin is warm and dry.  Neurological:     General: No focal deficit present.   Psychiatric:        Mood and Affect: Mood normal.        Behavior: Behavior normal.     Lab Results  Component Value Date   WBC 7.2 01/15/2022   HGB 10.5 (L) 01/15/2022   HCT 30.9 (L) 01/15/2022  PLT 287.0 01/15/2022   GLUCOSE 155 (H) 01/15/2022   CHOL 127 08/16/2021   TRIG 83 08/16/2021   HDL 39 (L) 08/16/2021   LDLDIRECT 84 03/16/2021   LDLCALC 72 08/16/2021   ALT 16 06/01/2021   AST 17 06/01/2021   NA 139 01/15/2022   K 4.3 01/15/2022   CL 105 01/15/2022   CREATININE 0.96 01/15/2022   BUN 22 01/15/2022   CO2 28 01/15/2022   TSH 0.63 01/15/2022   INR 1.04 01/14/2013   HGBA1C 5.9 01/15/2022   MICROALBUR 19.5 (H) 01/15/2022    DG Hip Unilat W or Wo Pelvis 2-3 Views Left  Result Date: 01/09/2022 CLINICAL DATA:  Low back pain and left hip pain. EXAM: DG HIP (WITH OR WITHOUT PELVIS) 2-3V LEFT COMPARISON:  None Available. FINDINGS: There is no evidence of hip fracture or dislocation. There is no evidence of arthropathy or other focal bone abnormality. IMPRESSION: Negative. Electronically Signed   By: Franchot Gallo M.D.   On: 01/09/2022 14:05   DG Lumbar Spine Complete  Result Date: 01/09/2022 CLINICAL DATA:  Low back pain EXAM: LUMBAR SPINE - COMPLETE 4+ VIEW COMPARISON:  None Available. FINDINGS: There is no evidence of lumbar spine fracture. Alignment is normal. Intervertebral disc spaces are maintained. Mild atherosclerotic aorta. IMPRESSION: Negative. Electronically Signed   By: Franchot Gallo M.D.   On: 01/09/2022 14:04    Assessment & Plan:   Geroldine was seen today for urinary tract infection.  Diagnoses and all orders for this visit:  Dysuria -     Urinalysis, Routine w reflex microscopic; Future -     CULTURE, URINE COMPREHENSIVE; Future -     CULTURE, URINE COMPREHENSIVE -     Urinalysis, Routine w reflex microscopic -     fluconazole (DIFLUCAN) 150 MG tablet; Take 1 tablet (150 mg total) by mouth once for 1 dose.  Acute cystitis without hematuria- Urine  culture is + for E coli. Sensitive to nitrofurantoin. -     nitrofurantoin, macrocrystal-monohydrate, (MACROBID) 100 MG capsule; Take 1 capsule (100 mg total) by mouth 2 (two) times daily for 5 days. -     fluconazole (DIFLUCAN) 150 MG tablet; Take 1 tablet (150 mg total) by mouth once for 1 dose.   I am having [name] start on nitrofurantoin (macrocrystal-monohydrate) and fluconazole. I am also having her maintain her glucose blood, OneTouch Verio IQ System, cholecalciferol, Hair Skin and Nails Formula, calcium carbonate, iron polysaccharides, BD Pen Needle Micro U/F, levocetirizine, budesonide-formoterol, Ozempic (2 MG/DOSE), isosorbide dinitrate, aspirin EC, acetaminophen, fluticasone, EPINEPHrine, rosuvastatin, omega-3 acid ethyl esters, spironolactone, carvedilol, Linzess, hydrALAZINE, lidocaine, tiZANidine, folic acid, clonazePAM, zinc gluconate, Entresto, omeprazole, and fenofibrate.  Meds ordered this encounter  Medications   nitrofurantoin, macrocrystal-monohydrate, (MACROBID) 100 MG capsule    Sig: Take 1 capsule (100 mg total) by mouth 2 (two) times daily for 5 days.    Dispense:  10 capsule    Refill:  0   fluconazole (DIFLUCAN) 150 MG tablet    Sig: Take 1 tablet (150 mg total) by mouth once for 1 dose.    Dispense:  1 tablet    Refill:  3     Follow-up: No follow-ups on file.  Scarlette Calico, MD

## 2022-02-16 LAB — CULTURE, URINE COMPREHENSIVE

## 2022-02-18 ENCOUNTER — Other Ambulatory Visit: Payer: Self-pay | Admitting: Cardiovascular Disease
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
## 2022-02-27 ENCOUNTER — Ambulatory Visit: Payer: No Typology Code available for payment source | Admitting: Family Medicine

## 2022-02-27 ENCOUNTER — Ambulatory Visit (INDEPENDENT_AMBULATORY_CARE_PROVIDER_SITE_OTHER): Payer: No Typology Code available for payment source | Admitting: Family Medicine

## 2022-02-27 VITALS — BP 120/82 | HR 78 | Ht 64.0 in | Wt 150.0 lb

## 2022-02-27 DIAGNOSIS — T148XXA Other injury of unspecified body region, initial encounter: Secondary | ICD-10-CM | POA: Diagnosis not present

## 2022-02-27 LAB — HM DIABETES EYE EXAM

## 2022-02-27 NOTE — Assessment & Plan Note (Signed)
Left buttocks area.  The patient does have a fairly large 1.  We discussed potential aspiration but at this point we will continue to monitor.  With only 61 week old at the moment.  Discussed with patient about heat, Arnica lotion, which activities to do and which ones to avoid.  Should actually get better relatively soon with conservative therapy.  Patient can follow-up again in 2 to 3 weeks if not completely resolved.

## 2022-02-27 NOTE — Patient Instructions (Signed)
Arnica lotion Heat and massage Will look worse than it is for next 2 week but should be gone in 4 weeks See me in 2-3 months

## 2022-03-14 ENCOUNTER — Ambulatory Visit: Payer: No Typology Code available for payment source

## 2022-03-22 ENCOUNTER — Ambulatory Visit: Payer: No Typology Code available for payment source

## 2022-03-25 ENCOUNTER — Other Ambulatory Visit: Payer: Self-pay | Admitting: Cardiovascular Disease

## 2022-04-13 ENCOUNTER — Other Ambulatory Visit: Payer: Self-pay | Admitting: Internal Medicine

## 2022-04-13 DIAGNOSIS — K5904 Chronic idiopathic constipation: Secondary | ICD-10-CM
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
## 2022-04-29 ENCOUNTER — Ambulatory Visit: Payer: No Typology Code available for payment source | Admitting: Family Medicine

## 2022-04-30 ENCOUNTER — Ambulatory Visit: Payer: No Typology Code available for payment source | Admitting: Family Medicine

## 2022-05-06 ENCOUNTER — Other Ambulatory Visit: Payer: Self-pay | Admitting: Internal Medicine

## 2022-05-06 DIAGNOSIS — K2100 Gastro-esophageal reflux disease with esophagitis, without bleeding: Secondary | ICD-10-CM
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
## 2022-05-20 ENCOUNTER — Ambulatory Visit: Payer: No Typology Code available for payment source
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
## 2022-05-21 ENCOUNTER — Ambulatory Visit: Payer: No Typology Code available for payment source | Admitting: Family Medicine

## 2022-05-27 ENCOUNTER — Ambulatory Visit
Payer: No Typology Code available for payment source | Attending: Cardiovascular Disease | Admitting: Cardiovascular Disease

## 2022-05-27 VITALS — BP 120/86 | HR 78 | Ht 64.0 in | Wt 143.0 lb

## 2022-05-27 DIAGNOSIS — I420 Dilated cardiomyopathy: Secondary | ICD-10-CM | POA: Diagnosis not present

## 2022-05-27 DIAGNOSIS — I10 Essential (primary) hypertension: Secondary | ICD-10-CM | POA: Diagnosis not present

## 2022-05-27 DIAGNOSIS — E118 Type 2 diabetes mellitus with unspecified complications: Secondary | ICD-10-CM

## 2022-05-27 DIAGNOSIS — E785 Hyperlipidemia, unspecified: Secondary | ICD-10-CM | POA: Diagnosis not present

## 2022-05-27 DIAGNOSIS — Z794 Long term (current) use of insulin: Secondary | ICD-10-CM

## 2022-05-27 NOTE — Patient Instructions (Signed)
Medication Instructions:  Your physician recommends that you continue on your current medications as directed. Please refer to the Current Medication list given to you today.  *If you need a refill on your cardiac medications before your next appointment, please call your pharmacy*  Lab Work: If you have labs (blood work) drawn today and your tests are completely normal, you will receive your results only by: [place] (if you have MyChart) OR A paper copy in the mail If you have any lab test that is abnormal or we need to change your treatment, we will call you to review the results.  Testing/Procedures: None ordered today.  Follow-Up: At [facility], you and your health needs are our priority.  As part of our continuing mission to provide you with exceptional heart care, we have created designated Provider Care Teams.  These Care Teams include your primary Cardiologist (physician) and Advanced Practice Providers (APPs -  Physician Assistants and Nurse Practitioners) who all work together to provide you with the care you need, when you need it.  We recommend signing up for the patient portal called "MyChart".  Sign up information is provided on this After Visit Summary.  MyChart is used to connect with patients for Virtual Visits (Telemedicine).  Patients are able to view lab/test results, encounter notes, upcoming appointments, etc.  Non-urgent messages can be sent to your provider as well.   To learn more about what you can do with MyChart, go to [email or web address].    Your next appointment:   1 year(s)  Provider:   Jenkins Rouge, MD

## 2022-06-04 ENCOUNTER — Ambulatory Visit: Payer: No Typology Code available for payment source | Admitting: Family Medicine

## 2022-06-04 ENCOUNTER — Encounter: Payer: Self-pay | Admitting: Family Medicine

## 2022-06-04 VITALS — BP 110/70 | HR 88 | Temp 97.3°F | Ht 64.0 in | Wt 148.2 lb

## 2022-06-04 DIAGNOSIS — H1131 Conjunctival hemorrhage, right eye: Secondary | ICD-10-CM | POA: Diagnosis not present

## 2022-06-04 DIAGNOSIS — R42 Dizziness and giddiness: Secondary | ICD-10-CM

## 2022-06-04 NOTE — Progress Notes (Signed)
Established Patient Office Visit  Subjective   Patient ID: Carolyn [name], female    DOB: 1961/01/14  Age: 62 y.o. MRN: [id number]  Chief Complaint  Patient presents with   Dizziness    Patient complains of dizziness, x1 day   Eye Injury    X1 day    HPI   Carolyn [name] was seen as a work in with 2 acute issues which both started this morning  She noticed small subconjunctival hemorrhage of right eye.  No injury.  No contact use.  No blurred vision.  No eye pain.  Does not take any anticoagulants or aspirin.  She has dizziness which started this morning.  She describes this as a "spinning" sensation.  Symptoms worse with head turning to the right.  Some mild nausea.  Symptoms somewhat better now.  Occasional headaches.  No ataxia.  No speech changes.  No swallowing difficulties.  No focal weakness.  Past Medical History:  Diagnosis Date   Allergy    seasonal   Anemia    Anemia of chronic disease suspected   Anxiety attack    Arthritis    Asthmatic bronchitis 04/26/2021   Benign fundic gland polyps of stomach    Dyslipidemia    Exertional shortness of breath    GERD (gastroesophageal reflux disease)    Heart murmur    Hyperlipidemia    Hypertension    Migraines    "probably weekly" (01/14/2013)   Type II diabetes mellitus ([employer])    Past Surgical History:  Procedure Laterality Date   CHOLECYSTECTOMY  1990   COLONOSCOPY     ENDOMETRIAL ABLATION  ~ 2000-2002   "twice" (01/14/2013)   LEFT HEART CATH AND CORONARY ANGIOGRAPHY N/A 06/01/2021   Procedure: LEFT HEART CATH AND CORONARY ANGIOGRAPHY;  Surgeon: Belva Crome, MD;  Location: [place] [facility];  Service: Cardiovascular;  Laterality: N/A;   LEFT HEART CATHETERIZATION WITH CORONARY ANGIOGRAM N/A 01/15/2013   Procedure: LEFT HEART CATHETERIZATION WITH CORONARY ANGIOGRAM;  Surgeon: Blane Ohara, MD;  Location: [facility] CATH LAB;  Service: Cardiovascular;  Laterality: N/A;   LEFT OOPHORECTOMY Left ~ 2003   ORIF TIBIA &  FIBULA FRACTURES Left 2003   VAGINAL HYSTERECTOMY  ~ 2003    reports that she has never smoked. She has never used smokeless tobacco. She reports that she does not drink alcohol and does not use drugs. family history includes Alzheimer's disease in her mother; Breast cancer in her sister; Cancer in her sister; Diabetes in her brother, sister, and sister; Emphysema in her father and sister; Hypertension in her mother; Thyroid cancer in her sister. Allergies  Allergen Reactions   Invokana [Canagliflozin] Other (See Comments)    YEAST INFECTIONS   Codeine Nausea And Vomiting   Doxycycline     GI upset, made feel "weird"   Flexeril [Cyclobenzaprine]     sedation   Morphine And Related Itching   Reglan [Metoclopramide] Other (See Comments)    "paralyzes me"   Silicon     In watch bands    Review of Systems  Constitutional:  Negative for chills and fever.  Eyes:  Negative for blurred vision, double vision, photophobia, pain and discharge.  Respiratory:  Negative for shortness of breath.   Cardiovascular:  Negative for chest pain.  Neurological:  Positive for dizziness. Negative for tremors, sensory change, speech change, focal weakness, seizures, loss of consciousness and weakness.      Objective:     BP 110/70 (BP Location:  Left Arm, Patient Position: Sitting, Cuff Size: Normal)   Pulse 88   Temp (!) 97.3 F (36.3 C) (Oral)   Ht 5' 4"$  (1.626 m)   Wt 148 lb 3.2 oz (67.2 kg)   SpO2 99%   BMI 25.44 kg/m  BP Readings from Last 3 Encounters:  06/04/22 110/70  05/27/22 120/86  02/27/22 120/82   Wt Readings from Last 3 Encounters:  06/04/22 148 lb 3.2 oz (67.2 kg)  05/27/22 143 lb (64.9 kg)  02/27/22 150 lb (68 kg)      Physical Exam Vitals reviewed.  Constitutional:      Appearance: Normal appearance.  Eyes:     Comments: Extraocular movements are normal.  Right eye reveals small subconjunctival hemorrhage inferiorly.  Pupils equal round reactive to light.  Fundi  benign  Cardiovascular:     Rate and Rhythm: Normal rate and regular rhythm.  Pulmonary:     Effort: Pulmonary effort is normal.     Breath sounds: Normal breath sounds. No wheezing or rales.  Neurological:     General: No focal deficit present.     Mental Status: She is alert and oriented to person, place, and time.     Cranial Nerves: No cranial nerve deficit.     Motor: No weakness.     Coordination: Coordination normal.     Gait: Gait normal.      No results found for any visits on 06/04/22.    The ASCVD Risk score (Arnett DK, et al., 2019) failed to calculate for the following reasons:   The patient has a prior MI or stroke diagnosis    Assessment & Plan:   #1 right subconjunctival hemorrhage.  Reassurance.  Should resolve over the next couple weeks  #2 benign peripheral positional vertigo.  Onset this morning.  Symptoms worse to the right.  Discussed Epley maneuvers with handout given.  Consider vestibular rehab if not resolving over the next few days.  Discussed limited role of medications in treating benign peripheral positional vertigo.  Follow-up with primary for any persistent or worsening symptoms   No follow-ups on file.    Carolann Littler, MD
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
## 2022-06-11 ENCOUNTER — Encounter: Payer: Self-pay | Admitting: Family Medicine

## 2022-06-11 ENCOUNTER — Ambulatory Visit: Payer: No Typology Code available for payment source | Admitting: Family Medicine

## 2022-06-11 VITALS — BP 120/84 | HR 84 | Ht 64.0 in | Wt 148.0 lb

## 2022-06-11 DIAGNOSIS — M7062 Trochanteric bursitis, left hip: Secondary | ICD-10-CM | POA: Diagnosis not present

## 2022-06-11 DIAGNOSIS — M170 Bilateral primary osteoarthritis of knee: Secondary | ICD-10-CM | POA: Diagnosis not present

## 2022-06-11 DIAGNOSIS — M1711 Unilateral primary osteoarthritis, right knee: Secondary | ICD-10-CM | POA: Diagnosis not present

## 2022-06-11 DIAGNOSIS — M1712 Unilateral primary osteoarthritis, left knee: Secondary | ICD-10-CM

## 2022-06-11 NOTE — Patient Instructions (Signed)
Be proud Stay active Have appt in 3 months

## 2022-06-11 NOTE — Assessment & Plan Note (Signed)
Degenerative arthritis.  Injection given today, tolerated the procedure well, discussed with patient that with the weight loss that should slow down any progression.  Could be a candidate for viscosupplementation.  Follow-up again in 6 to 8 weeks

## 2022-06-11 NOTE — Assessment & Plan Note (Signed)
Patient with injection.  Follow-up with exacerbation.  Patient has been more active.   Ultrasound increase activity slowly.  Follow-up again in 6 to 8 weeks

## 2022-06-11 NOTE — Assessment & Plan Note (Signed)
Repeat injection given again today.  Patient is trying to be more active.  Likely patient has lost significant amount of weight and is doing well.  We discussed with her we do not want her to lose any more weight at this time likely.  Start working on muscle strength and endurance.  Follow-up again in 6 to 8 weeks

## 2022-06-16 ENCOUNTER — Other Ambulatory Visit: Payer: Self-pay | Admitting: Cardiovascular Disease

## 2022-06-18 ENCOUNTER — Encounter: Payer: Self-pay | Admitting: Cardiovascular Disease

## 2022-06-18 MED ORDER — ROSUVASTATIN CALCIUM 20 MG PO TABS: 20.0000 mg | ORAL_TABLET | Freq: Every day | ORAL | 3 refills | Status: DC

## 2022-06-26 ENCOUNTER — Ambulatory Visit
Admission: RE | Admit: 2022-06-26 | Discharge: 2022-06-26 | Disposition: A | Discharge status: Home / Self Care | Payer: No Typology Code available for payment source | Source: Ambulatory Visit | Attending: Internal Medicine | Admitting: Internal Medicine

## 2022-06-26 DIAGNOSIS — Z1231 Encounter for screening mammogram for malignant neoplasm of breast: Secondary | ICD-10-CM

## 2022-06-27 ENCOUNTER — Encounter: Payer: Self-pay | Admitting: Family Medicine

## 2022-06-27 ENCOUNTER — Encounter: Payer: Self-pay | Admitting: Cardiovascular Disease

## 2022-06-27 ENCOUNTER — Telehealth: Payer: Self-pay

## 2022-06-27 DIAGNOSIS — I420 Dilated cardiomyopathy: Secondary | ICD-10-CM

## 2022-06-27 MED ORDER — FUROSEMIDE 40 MG PO TABS: 40.0000 mg | ORAL_TABLET | Freq: Every day | ORAL | 0 refills | Status: DC

## 2022-06-27 NOTE — Telephone Encounter (Signed)
Pt sent my chart message: Good Afternoon,   Is it an issue that I am not using the bathroom as much, I drink so much water and it's not coming out! I use to weigh 138 now this week I am 150! I think I need a water pill, my eating habits are the same and I fill swollen!    Thank you    Spoke to the patient she mentioned has gained weight and she is concerned. She drank 5 bottles of water however, void 2 times today. Also experiencing knee pain and swollen abdomen. Spoke to the DOD: Advised the patient to take lasix 40 mg with potassium 20 mg for 3 days. If her weight does not decrease then go the nearest emergency room. Patient voiced understanding. Will forward to MD and nurse.  Weights  3/7   138 3/8   138 3/9   143 3/10 150 3/11 150 3/12 150 3/13 150 3/14 150

## 2022-06-30 ENCOUNTER — Emergency Department (HOSPITAL_COMMUNITY)
Admission: EM | Admit: 2022-06-30 | Discharge: 2022-06-30 | Disposition: A | Discharge status: Home / Self Care | Payer: No Typology Code available for payment source | Attending: Emergency Medicine | Admitting: Emergency Medicine

## 2022-06-30 ENCOUNTER — Encounter (HOSPITAL_COMMUNITY): Payer: Self-pay

## 2022-06-30 ENCOUNTER — Emergency Department (HOSPITAL_COMMUNITY): Payer: No Typology Code available for payment source

## 2022-06-30 DIAGNOSIS — E119 Type 2 diabetes mellitus without complications: Secondary | ICD-10-CM | POA: Insufficient documentation

## 2022-06-30 DIAGNOSIS — Z1152 Encounter for screening for COVID-19: Secondary | ICD-10-CM | POA: Insufficient documentation

## 2022-06-30 DIAGNOSIS — I509 Heart failure, unspecified: Secondary | ICD-10-CM | POA: Diagnosis not present

## 2022-06-30 DIAGNOSIS — I110 Hypertensive heart disease with heart failure: Secondary | ICD-10-CM | POA: Diagnosis not present

## 2022-06-30 DIAGNOSIS — R5383 Other fatigue: Secondary | ICD-10-CM | POA: Diagnosis present

## 2022-06-30 DIAGNOSIS — Z7982 Long term (current) use of aspirin: Secondary | ICD-10-CM | POA: Diagnosis not present

## 2022-06-30 LAB — BASIC METABOLIC PANEL
Anion gap: 6 (ref 5–15)
BUN: 19 mg/dL (ref 6–20)
CO2: 27 mmol/L (ref 22–32)
Calcium: 9.3 mg/dL (ref 8.9–10.3)
Chloride: 106 mmol/L (ref 98–111)
Creatinine, Ser: 1.06 mg/dL — ABNORMAL HIGH (ref 0.44–1.00)
GFR, Estimated: >60 mL/min (ref >60)
Glucose, Bld: 91 mg/dL (ref 70–99)
Potassium: 3.6 mmol/L (ref 3.5–5.1)
Sodium: 139 mmol/L (ref 135–145)

## 2022-06-30 LAB — URINALYSIS, ROUTINE W REFLEX MICROSCOPIC
Bilirubin Urine: NEGATIVE
Glucose, UA: NEGATIVE mg/dL
Hgb urine dipstick: NEGATIVE
Ketones, ur: NEGATIVE mg/dL
Leukocytes,Ua: NEGATIVE
Nitrite: NEGATIVE
Protein, ur: NEGATIVE mg/dL
Specific Gravity, Urine: 1.018 (ref 1.005–1.030)
pH: 7 (ref 5.0–8.0)

## 2022-06-30 LAB — RESP PANEL BY RT-PCR (RSV, FLU A&B, COVID)  RVPGX2
Influenza A by PCR: NEGATIVE
Influenza B by PCR: NEGATIVE
Resp Syncytial Virus by PCR: NEGATIVE
SARS Coronavirus 2 by RT PCR: NEGATIVE

## 2022-06-30 LAB — CBC
HCT: 31.7 % — ABNORMAL LOW (ref 36.0–46.0)
Hemoglobin: 10.5 g/dL — ABNORMAL LOW (ref 12.0–15.0)
MCH: 31.4 pg (ref 26.0–34.0)
MCHC: 33.1 g/dL (ref 30.0–36.0)
MCV: 94.9 fL (ref 80.0–100.0)
Platelets: 242 10*3/uL (ref 150–400)
RBC: 3.34 MIL/uL — ABNORMAL LOW (ref 3.87–5.11)
RDW: 12.7 % (ref 11.5–15.5)
WBC: 5.9 10*3/uL (ref 4.0–10.5)
nRBC: 0 % (ref 0.0–0.2)

## 2022-06-30 LAB — BRAIN NATRIURETIC PEPTIDE: B Natriuretic Peptide: 68.6 pg/mL (ref 0.0–100.0)

## 2022-06-30 MED ORDER — FUROSEMIDE 10 MG/ML IJ SOLN: 40.0000 mg | Freq: Once | INTRAMUSCULAR | Status: DC

## 2022-06-30 MED ORDER — FUROSEMIDE 20 MG PO TABS: 40.0000 mg | ORAL_TABLET | Freq: Every day | ORAL | 0 refills | Status: DC

## 2022-06-30 NOTE — ED Triage Notes (Signed)
Pt has hx of CHF and went from 138lbs to 150lbs in one week. Pt states that she has not been urinating appropriately to get fluid off. Pt does not have any swelling in her legs. Pt called PCP, was given 40 mg lasix, is still unable to void. Denies any burning with urination  Pt states she would void in the morning, but then not until 1600.  Pt endorses fatigue.

## 2022-06-30 NOTE — Discharge Instructions (Addendum)
I recommend that you follow-up with your primary care doctor to discuss whether you need to be on Lasix more long-term, you do not have any evidence of urinary tract infection today, you do not have any evidence of urinary retention.  They may want to work you up for generalized fatigue running some lab work such as your vitamin D, B levels, or thyroid hormone levels as these can also contribute to generalized fatigue.  I do think would be reasonable to take another 3 days of Lasix given the congestion we noted on your chest x-ray to help decrease some of the fluid in your lungs, if you have chest pain, shortness of breath, worsening weight gain despite all of the above please return to the emergency department for further evaluation and management.

## 2022-06-30 NOTE — ED Provider Notes (Signed)
[facility] Provider Note   CSN: [id number] Arrival date & time: 06/30/22  [id number]     History  Chief Complaint  Patient presents with   Fatigue    Carolyn [name] is a 62 y.o. female with past medical history significant for hypertension, acid reflux, hyperlipidemia, diabetes, obesity, CHF who presents with concern for weight gain, went from 138 pounds to 150 pounds in 1 week.  Patient reports that she had not been urinating appropriately to get fluid off.  Patient reports that she had several days of feeling like she was not urinating a normal amount despite trying, but did go 3 times this morning.  No longer feels any pressure in the bladder.  She reports that she has been feeling generally fatigued for around a week.  She denies any previous history of vitamin deficiencies, thyroid, electrolyte abnormalities.  She denies any cough, shortness of breath, fever, chills, or other congestion, she denies any chest pain.  She denies any swelling of the legs.  She reports that the heart failure is a newer diagnosis so she is not used to having any exacerbation, she was given Lasix 40 mg by her PCP on Thursday, and only felt like she was urinating normally this morning.  HPI     Home Medications Prior to Admission medications   Medication Sig Start Date End Date Taking? Authorizing Provider  furosemide (LASIX) 20 MG tablet Take 2 tablets (40 mg total) by mouth daily. 06/30/22  Yes ,  H, PA-C  acetaminophen (TYLENOL) 500 MG tablet Take 1,000 mg by mouth every 6 (six) hours as needed for mild pain or headache.    [provider]  aspirin EC 81 MG tablet Take 81 mg by mouth daily. Swallow whole.    [provider]  Blood Glucose Monitoring Suppl (ONETOUCH VERIO IQ SYSTEM) W/DEVICE KIT 1 Act by Does not apply route 3 (three) times daily. 04/06/14   [name], MD  budesonide-formoterol (SYMBICORT) 80-4.5 MCG/ACT  inhaler Inhale 2 puffs into the lungs 2 (two) times daily. 04/26/21   [name], MD  calcium carbonate (OS-CAL - DOSED IN MG OF ELEMENTAL CALCIUM) 1250 (500 Ca) MG tablet Take 2 tablets by mouth daily with breakfast.    [provider]  carvedilol (COREG) 25 MG tablet Take 1 tablet by mouth twice daily 12/10/21   Josue Hector, MD  cholecalciferol (VITAMIN D3) 25 MCG (1000 UNIT) tablet Take 2,000 Units by mouth daily.    [provider]  clonazePAM (KLONOPIN) 1 MG tablet Take 1 tablet (1 mg total) by mouth 2 (two) times daily as needed for anxiety. 01/17/22   [name], MD  ENTRESTO 97-103 MG Take 1 tablet by mouth twice daily 01/21/22   Josue Hector, MD  EPINEPHrine 0.3 mg/0.3 mL IJ SOAJ injection Inject 0.3 mg into the muscle as needed for anaphylaxis. 06/01/21   Margie Billet, PA-C  fenofibrate (TRICOR) 145 MG tablet Take 1 tablet (145 mg total) by mouth daily. 02/06/22   Josue Hector, MD  fluticasone (FLONASE) 50 MCG/ACT nasal spray Place 1 spray into both nostrils daily as needed for allergies or rhinitis.    [provider]  folic acid (FOLVITE) 1 MG tablet Take 1 tablet (1 mg total) by mouth daily. 01/16/22   [name], MD  glucose blood (ONE TOUCH ULTRA TEST) test strip Use to test blood sugar three times a week and prn  if having symptoms of low blood sugar Dx: 250.92 90 day supply 05/28/13   Norins, Heinz Knuckles, MD  hydrALAZINE (APRESOLINE) 50 MG tablet TAKE 1 TABLET BY MOUTH THREE TIMES DAILY 12/10/21   Josue Hector, MD  Insulin Pen Needle (BD PEN NEEDLE MICRO U/F) 32G X 6 MM MISC Inject 1 Units into the skin once a week. 01/01/21   Josue Hector, MD  iron polysaccharides (NIFEREX) 150 MG capsule Take 150 mg by mouth daily.    [provider]  isosorbide dinitrate (ISORDIL) 20 MG tablet Take 1 tablet (20 mg total) by mouth 3 (three) times daily. 05/08/21   Josue Hector, MD  levocetirizine (XYZAL) 5 MG tablet Take 5 mg by mouth  every evening.    [provider]  lidocaine (LIDODERM) 5 % Place 1 patch onto the skin daily. Remove & Discard patch within 12 hours or as directed by MD 01/09/22   Henderly, Britni A, PA-C  LINZESS 145 MCG CAPS capsule TAKE 1 CAPSULE BY MOUTH ONCE DAILY BEFORE BREAKFAST 04/13/22   [name], MD  Multiple Vitamins-Minerals (HAIR SKIN AND NAILS FORMULA) TABS Take 1 tablet by mouth daily.    [provider]  omega-3 acid ethyl esters (LOVAZA) 1 g capsule Take 2 capsules by mouth twice daily 08/13/21   Josue Hector, MD  omeprazole (PRILOSEC) 40 MG capsule TAKE 1 CAPSULE BY MOUTH TWICE DAILY AT  8  AM  AND  10  PM 05/06/22   [name], MD  rosuvastatin (CRESTOR) 20 MG tablet Take 1 tablet (20 mg total) by mouth daily. 06/18/22   Josue Hector, MD  Semaglutide, 2 MG/DOSE, (OZEMPIC, 2 MG/DOSE,) 8 MG/3ML SOPN INJECT 2 MG  SUBCUTANEOUSLY ONCE A WEEK 03/27/22   Josue Hector, MD  spironolactone (ALDACTONE) 25 MG tablet Take 1 tablet by mouth once daily 11/12/21   Josue Hector, MD  tiZANidine (ZANAFLEX) 2 MG tablet Take 1 tablet (2 mg total) by mouth at bedtime as needed for muscle spasms. 01/10/22   Lyndal Pulley, DO  zinc gluconate 50 MG tablet Take 1 tablet (50 mg total) by mouth daily. 01/19/22   [name], MD      Allergies    Invokana [canagliflozin], Codeine, Doxycycline, Flexeril [cyclobenzaprine], Morphine and related, Reglan [metoclopramide], and Silicon    Review of Systems   Review of Systems  All other systems reviewed and are negative.   Physical Exam Updated Vital Signs BP 129/82   Pulse 77   Temp 97.9 F (36.6 C) (Oral)   Resp 14   Ht 5\' 4"  (1.626 m)   Wt 68 kg   SpO2 99%   BMI 25.75 kg/m  Physical Exam Vitals and nursing note reviewed.  Constitutional:      General: She is not in acute distress.    Appearance: Normal appearance.  HENT:     Head: Normocephalic and atraumatic.  Eyes:     General:        Right eye: No discharge.         Left eye: No discharge.  Cardiovascular:     Rate and Rhythm: Normal rate and regular rhythm.     Heart sounds: No murmur heard.    No friction rub. No gallop.  Pulmonary:     Effort: Pulmonary effort is normal.     Breath sounds: Normal breath sounds.     Comments: No wheezing, crackles, rhonchi, rales Abdominal:  General: Bowel sounds are normal.     Palpations: Abdomen is soft.  Musculoskeletal:     Comments: No swelling of bilateral lower extremities  Skin:    General: Skin is warm and dry.     Capillary Refill: Capillary refill takes less than 2 seconds.  Neurological:     Mental Status: She is alert and oriented to person, place, and time.  Psychiatric:        Mood and Affect: Mood normal.        Behavior: Behavior normal.     ED Results / Procedures / Treatments   Labs (all labs ordered are listed, but only abnormal results are displayed) Labs Reviewed  CBC - Abnormal; Notable for the following components:      Result Value   RBC 3.34 (*)    Hemoglobin 10.5 (*)    HCT 31.7 (*)    All other components within normal limits  BASIC METABOLIC PANEL - Abnormal; Notable for the following components:   Creatinine, Ser 1.06 (*)    All other components within normal limits  RESP PANEL BY RT-PCR (RSV, FLU A&B, COVID)  [id number]  BRAIN NATRIURETIC PEPTIDE  URINALYSIS, ROUTINE W REFLEX MICROSCOPIC    EKG EKG Interpretation  Date/Time:  Sunday June 30 2022 08:36:31 EDT Ventricular Rate:  81 PR Interval:  188 QRS Duration: 87 QT Interval:  354 QTC Calculation: 411 R Axis:   16 Text Interpretation: Sinus rhythm Consider anterior infarct No significant change since last tracing Confirmed by Leanord Asal ([id number]) on 06/30/2022 8:47:54 AM  Radiology DG Chest 2 View  Result Date: 06/30/2022 CLINICAL DATA:  Shortness of breath. EXAM: CHEST - 2 VIEW COMPARISON:  05/31/2021 FINDINGS: Lungs are adequately inflated with minimal patchy hazy density in the lung bases  which may be due to atelectasis/vascular crowding although early infection is possible. No effusion. Cardiomediastinal silhouette and remainder of the exam is unchanged. IMPRESSION: Minimal patchy hazy density in the lung bases which may be due to atelectasis/vascular crowding, although early infection is possible. Electronically Signed   By: Marin Olp M.D.   On: 06/30/2022 09:25    Procedures Procedures    Medications Ordered in ED Medications - No data to display  ED Course/ Medical Decision Making/ A&P                             Medical Decision Making Amount and/or Complexity of Data Reviewed Labs: ordered. Radiology: ordered.   This patient is a 62 y.o. female  who presents to the ED for concern of generalized fatigue, weight gain, concern for CHF exacerbation.   Differential diagnoses prior to evaluation: The emergent differential diagnosis includes, but is not limited to,  CVA, spinal cord injury, ACS, arrhythmia, syncope, orthostatic hypotension, sepsis, hypoglycemia, hypoxia, electrolyte disturbance, endocrine disorder, anemia, environmental exposure, polypharmacy, CHF exacerbation,UTI, vs other . This is not an exhaustive differential.   Past Medical History / Co-morbidities: CHF, HTN, GERD, HLD  Additional history: Chart reviewed. Pertinent results include: Reviewed previous cardiology evaluations, echo from last year with LV function of 50%, improved from prior  Physical Exam: Physical exam performed. The pertinent findings include: Patient is overall well-appearing in no acute distress with stable vital signs.  She is able to urinate, no evidence of urinary retention.  She does not have any wheezing, rhonchi, crackles, rales.  Lab Tests/Imaging studies: I personally interpreted labs/imaging and the pertinent results include: Plain film chest x-ray  which shows some questionable pulmonary vascular congestion versus early developing infection, based on her symptoms,  physical presentation today I have very low clinical suspicion that she has developing pneumonia, lab work negative for COVID, flu, RSV, her CBC with mild anemia, hemoglobin 10.5, not significantly changed from baseline, BMP with creatinine of 1.06 not significantly changed from baseline, her BNP is not elevated at 68.6 today, although with of vascular congestion on her chest x-ray I do suspect that she may have some degree of subacute CHF exacerbation especially with her reported weight gain.  UA unremarkable. I agree with the radiologist interpretation of imaging obtained.  Cardiac monitoring: EKG obtained and interpreted by my attending physician which shows: Normal sinus rhythm   Medications: Based on her presentation today I think be reasonable to discharge on another 2 to 3 days of Lasix, with close follow-up plan for PCP, cardiology, discussed that if she continues to have fatigue with unexplained cause it would be reasonable for her PCP to check her vitamin D, TSH, and other outpatient labs, no evidence of acute or emergent cause of her fatigue today.   Disposition: After consideration of the diagnostic results and the patients response to treatment, I feel that patient is stable for discharge with no evidence of urinary retention, she does have some generalized fatigue, could be reasonable for her to follow-up with PCP for evaluation of generalized fatigue, cardiology to discuss whether she needs to be taking Lasix more regularly.   emergency department workup does not suggest an emergent condition requiring admission or immediate intervention beyond what has been performed at this time. The plan is: as above. The patient is safe for discharge and has been instructed to return immediately for worsening symptoms, change in symptoms or any other concerns.  Final Clinical Impression(s) / ED Diagnoses Final diagnoses:  Other fatigue    Rx / DC Orders ED Discharge Orders          Ordered     furosemide (LASIX) 20 MG tablet  Daily        06/30/22 1135              , [place] H, PA-C 06/30/22 [place], Annetta [place] K, DO 06/30/22 1511

## 2022-07-01 ENCOUNTER — Encounter: Payer: Self-pay | Admitting: Internal Medicine

## 2022-07-01 ENCOUNTER — Ambulatory Visit: Payer: No Typology Code available for payment source | Admitting: Internal Medicine

## 2022-07-01 ENCOUNTER — Telehealth: Payer: Self-pay | Admitting: Internal Medicine

## 2022-07-01 VITALS — BP 108/68 | HR 78 | Temp 98.1°F | Ht 64.0 in | Wt 152.0 lb

## 2022-07-01 DIAGNOSIS — I10 Essential (primary) hypertension: Secondary | ICD-10-CM

## 2022-07-01 DIAGNOSIS — D539 Nutritional anemia, unspecified: Secondary | ICD-10-CM | POA: Insufficient documentation

## 2022-07-01 DIAGNOSIS — I9589 Other hypotension: Secondary | ICD-10-CM

## 2022-07-01 DIAGNOSIS — E118 Type 2 diabetes mellitus with unspecified complications: Secondary | ICD-10-CM | POA: Diagnosis not present

## 2022-07-01 DIAGNOSIS — E861 Hypovolemia: Secondary | ICD-10-CM | POA: Insufficient documentation

## 2022-07-01 LAB — CBC WITH DIFFERENTIAL/PLATELET
Basophils Absolute: 0 10*3/uL (ref 0.0–0.1)
Basophils Relative: 0.8 % (ref 0.0–3.0)
Eosinophils Absolute: 0.1 10*3/uL (ref 0.0–0.7)
Eosinophils Relative: 1 % (ref 0.0–5.0)
HCT: 30.6 % — ABNORMAL LOW (ref 36.0–46.0)
Hemoglobin: 10.3 g/dL — ABNORMAL LOW (ref 12.0–15.0)
Lymphocytes Relative: 44 % (ref 12.0–46.0)
Lymphs Abs: 2.3 10*3/uL (ref 0.7–4.0)
MCHC: 33.5 g/dL (ref 30.0–36.0)
MCV: 93.3 fl (ref 78.0–100.0)
Monocytes Absolute: 0.4 10*3/uL (ref 0.1–1.0)
Monocytes Relative: 7.3 % (ref 3.0–12.0)
Neutro Abs: 2.4 10*3/uL (ref 1.4–7.7)
Neutrophils Relative %: 46.9 % (ref 43.0–77.0)
Platelets: 242 10*3/uL (ref 150.0–400.0)
RBC: 3.28 Mil/uL — ABNORMAL LOW (ref 3.87–5.11)
RDW: 13.1 % (ref 11.5–15.5)
WBC: 5.2 10*3/uL (ref 4.0–10.5)

## 2022-07-01 LAB — IBC + FERRITIN
Ferritin: 101.4 ng/mL (ref 10.0–291.0)
Iron: 66 ug/dL (ref 42–145)
Saturation Ratios: 14.9 % — ABNORMAL LOW (ref 20.0–50.0)
TIBC: 443.8 ug/dL (ref 250.0–450.0)
Transferrin: 317 mg/dL (ref 212.0–360.0)

## 2022-07-01 LAB — BASIC METABOLIC PANEL
BUN: 19 mg/dL (ref 6–23)
CO2: 27 mEq/L (ref 19–32)
Calcium: 9.5 mg/dL (ref 8.4–10.5)
Chloride: 105 mEq/L (ref 96–112)
Creatinine, Ser: 1.15 mg/dL (ref 0.40–1.20)
GFR: 51.83 mL/min — ABNORMAL LOW (ref >60.00)
Glucose, Bld: 80 mg/dL (ref 70–99)
Potassium: 3.7 mEq/L (ref 3.5–5.1)
Sodium: 140 mEq/L (ref 135–145)

## 2022-07-01 LAB — HEPATIC FUNCTION PANEL
ALT: 11 U/L (ref 0–35)
AST: 12 U/L (ref 0–37)
Albumin: 4.4 g/dL (ref 3.5–5.2)
Alkaline Phosphatase: 33 U/L — ABNORMAL LOW (ref 39–117)
Bilirubin, Direct: 0.1 mg/dL (ref 0.0–0.3)
Total Bilirubin: 0.4 mg/dL (ref 0.2–1.2)
Total Protein: 7 g/dL (ref 6.0–8.3)

## 2022-07-01 LAB — HEMOGLOBIN A1C: Hgb A1c MFr Bld: 5.5 % (ref 4.6–6.5)

## 2022-07-01 LAB — TSH: TSH: 2.06 u[IU]/mL (ref 0.35–5.50)

## 2022-07-01 LAB — FOLATE: Folate: >23.8 ng/mL (ref >5.9)

## 2022-07-01 LAB — VITAMIN B12: Vitamin B-12: 257 pg/mL (ref 211–911)

## 2022-07-01 LAB — CORTISOL: Cortisol, Plasma: 3.7 ug/dL

## 2022-07-01 NOTE — Telephone Encounter (Signed)
Pt is scheduled today @2 :40

## 2022-07-01 NOTE — Patient Instructions (Signed)

## 2022-07-01 NOTE — Progress Notes (Unsigned)
Subjective:  Patient ID: Carolyn [name], female    DOB: August 06, 1960  Age: 62 y.o. MRN: [id number]  CC: No chief complaint on file.   HPI Carolyn [name] presents for ***  Outpatient Medications Prior to Visit  Medication Sig Dispense Refill   acetaminophen (TYLENOL) 500 MG tablet Take 1,000 mg by mouth every 6 (six) hours as needed for mild pain or headache.     aspirin EC 81 MG tablet Take 81 mg by mouth daily. Swallow whole.     Blood Glucose Monitoring Suppl (ONETOUCH VERIO IQ SYSTEM) W/DEVICE KIT 1 Act by Does not apply route 3 (three) times daily. 2 kit 0   budesonide-formoterol (SYMBICORT) 80-4.5 MCG/ACT inhaler Inhale 2 puffs into the lungs 2 (two) times daily. 3 each 0   calcium carbonate (OS-CAL - DOSED IN MG OF ELEMENTAL CALCIUM) 1250 (500 Ca) MG tablet Take 2 tablets by mouth daily with breakfast.     carvedilol (COREG) 25 MG tablet Take 1 tablet by mouth twice daily 180 tablet 3   cholecalciferol (VITAMIN D3) 25 MCG (1000 UNIT) tablet Take 2,000 Units by mouth daily.     clonazePAM (KLONOPIN) 1 MG tablet Take 1 tablet (1 mg total) by mouth 2 (two) times daily as needed for anxiety. 180 tablet 0   ENTRESTO 97-103 MG Take 1 tablet by mouth twice daily 60 tablet 8   EPINEPHrine 0.3 mg/0.3 mL IJ SOAJ injection Inject 0.3 mg into the muscle as needed for anaphylaxis. 1 each 0   fenofibrate (TRICOR) 145 MG tablet Take 1 tablet (145 mg total) by mouth daily. 30 tablet 11   fluticasone (FLONASE) 50 MCG/ACT nasal spray Place 1 spray into both nostrils daily as needed for allergies or rhinitis.     folic acid (FOLVITE) 1 MG tablet Take 1 tablet (1 mg total) by mouth daily. 90 tablet 1   glucose blood (ONE TOUCH ULTRA TEST) test strip Use to test blood sugar three times a week and prn if having symptoms of low blood sugar Dx: 250.92 90 day supply 100 each 11   hydrALAZINE (APRESOLINE) 50 MG tablet TAKE 1 TABLET BY MOUTH THREE TIMES DAILY 270 tablet 3   Insulin Pen Needle (BD PEN NEEDLE  MICRO U/F) 32G X 6 MM MISC Inject 1 Units into the skin once a week. 100 each 3   iron polysaccharides (NIFEREX) 150 MG capsule Take 150 mg by mouth daily.     isosorbide dinitrate (ISORDIL) 20 MG tablet Take 1 tablet (20 mg total) by mouth 3 (three) times daily. 270 tablet 3   levocetirizine (XYZAL) 5 MG tablet Take 5 mg by mouth every evening.     lidocaine (LIDODERM) 5 % Place 1 patch onto the skin daily. Remove & Discard patch within 12 hours or as directed by MD 30 patch 0   LINZESS 145 MCG CAPS capsule TAKE 1 CAPSULE BY MOUTH ONCE DAILY BEFORE BREAKFAST 90 capsule 0   Multiple Vitamins-Minerals (HAIR SKIN AND NAILS FORMULA) TABS Take 1 tablet by mouth daily.     omega-3 acid ethyl esters (LOVAZA) 1 g capsule Take 2 capsules by mouth twice daily 360 capsule 3   omeprazole (PRILOSEC) 40 MG capsule TAKE 1 CAPSULE BY MOUTH TWICE DAILY AT  8  AM  AND  10  PM 180 capsule 0   rosuvastatin (CRESTOR) 20 MG tablet Take 1 tablet (20 mg total) by mouth daily. 90 tablet 3   Semaglutide, 2 MG/DOSE, (OZEMPIC, 2 MG/DOSE,) 8  MG/3ML SOPN INJECT 2 MG  SUBCUTANEOUSLY ONCE A WEEK 3 mL 11   spironolactone (ALDACTONE) 25 MG tablet Take 1 tablet by mouth once daily 90 tablet 2   tiZANidine (ZANAFLEX) 2 MG tablet Take 1 tablet (2 mg total) by mouth at bedtime as needed for muscle spasms. 30 tablet 0   zinc gluconate 50 MG tablet Take 1 tablet (50 mg total) by mouth daily. 90 tablet 1   furosemide (LASIX) 20 MG tablet Take 2 tablets (40 mg total) by mouth daily. (Patient not taking: Reported on 07/01/2022) 3 tablet 0   No facility-administered medications prior to visit.    ROS Review of Systems  Constitutional:  Positive for fatigue.  Neurological:  Positive for dizziness and light-headedness. Negative for weakness.  Hematological:  Negative for adenopathy. Does not bruise/bleed easily.  Psychiatric/Behavioral: Negative.      Objective:  BP 108/68 (BP Location: Left Arm, Patient Position: Sitting, Cuff Size:  Normal)   Pulse 78   Temp 98.1 F (36.7 C) (Oral)   Ht 5\' 4"  (1.626 m)   Wt 152 lb (68.9 kg)   SpO2 96%   BMI 26.09 kg/m   BP Readings from Last 3 Encounters:  07/01/22 108/68  06/30/22 121/82  06/11/22 120/84    Wt Readings from Last 3 Encounters:  07/01/22 152 lb (68.9 kg)  06/30/22 150 lb (68 kg)  06/11/22 148 lb (67.1 kg)    Physical Exam  Lab Results  Component Value Date   WBC 5.9 06/30/2022   HGB 10.5 (L) 06/30/2022   HCT 31.7 (L) 06/30/2022   PLT 242 06/30/2022   GLUCOSE 91 06/30/2022   CHOL 127 08/16/2021   TRIG 83 08/16/2021   HDL 39 (L) 08/16/2021   LDLDIRECT 84 03/16/2021   LDLCALC 72 08/16/2021   ALT 16 06/01/2021   AST 17 06/01/2021   NA 139 06/30/2022   K 3.6 06/30/2022   CL 106 06/30/2022   CREATININE 1.06 (H) 06/30/2022   BUN 19 06/30/2022   CO2 27 06/30/2022   TSH 0.63 01/15/2022   INR 1.04 01/14/2013   HGBA1C 5.9 01/15/2022   MICROALBUR 19.5 (H) 01/15/2022    DG Chest 2 View  Result Date: 06/30/2022 CLINICAL DATA:  Shortness of breath. EXAM: CHEST - 2 VIEW COMPARISON:  05/31/2021 FINDINGS: Lungs are adequately inflated with minimal patchy hazy density in the lung bases which may be due to atelectasis/vascular crowding although early infection is possible. No effusion. Cardiomediastinal silhouette and remainder of the exam is unchanged. IMPRESSION: Minimal patchy hazy density in the lung bases which may be due to atelectasis/vascular crowding, although early infection is possible. Electronically Signed   By: Marin Olp M.D.   On: 06/30/2022 09:25    Assessment & Plan:  Deficiency anemia -     IBC + Ferritin; Future -     CBC with Differential/Platelet; Future -     Vitamin B12; Future -     Folate; Future  Hypotension due to hypovolemia -     Basic metabolic panel; Future -     Cortisol; Future -     TSH; Future -     Hepatic function panel; Future -     Urinalysis, Routine w reflex microscopic; Future  Type II diabetes  mellitus with manifestations (HCC) -     Hemoglobin A1c; Future  Essential hypertension -     Hepatic function panel; Future -     Urinalysis, Routine w reflex microscopic; Future  Follow-up: Return in about 3 months (around 10/01/2022).  Scarlette Calico, MD

## 2022-07-01 NOTE — Telephone Encounter (Signed)
Pt called in wanted to know did she need to been seen or come in for lab work first. Pt went to ER on Sunday 06/30/22 at the Er dept Dr. Martin Majestic requesting for patient to get her thyroids and Vitamin B and D. Pt also had some fluid in her lungs please give pt update about what she need to do and she also has an upcoming appt on 07/22/22

## 2022-07-02 LAB — URINALYSIS, ROUTINE W REFLEX MICROSCOPIC
Bilirubin Urine: NEGATIVE
Hgb urine dipstick: NEGATIVE
Ketones, ur: NEGATIVE
Nitrite: NEGATIVE
RBC / HPF: NONE SEEN (ref 0–?)
Specific Gravity, Urine: 1.01 (ref 1.000–1.030)
Total Protein, Urine: NEGATIVE
Urine Glucose: NEGATIVE
Urobilinogen, UA: 0.2 (ref 0.0–1.0)
pH: 6 (ref 5.0–8.0)

## 2022-07-03 ENCOUNTER — Telehealth: Payer: Self-pay

## 2022-07-03 NOTE — Transitions of Care (Post Inpatient/ED Visit) (Signed)
   07/03/2022  Name: Carolyn [name] MRN: [id number] DOB: [birth year]  Today's TOC FU Call Status: Today's TOC FU Call Status:: Successful TOC FU Call Competed TOC FU Call Complete Date: 07/03/22  Transition Care Management Follow-up Telephone Call Date of Discharge: 06/30/22 Discharge Facility: [facility]) Type of Discharge: Emergency Department Reason for ED Visit: Other: (weakness)  Items Reviewed:    Home Care and Equipment/Supplies:    Functional Questionnaire:    Follow up appointments reviewed:   Patient has already been seen   [place], Hana Direct Dial [phone]

## 2022-07-04 ENCOUNTER — Other Ambulatory Visit: Payer: Self-pay | Admitting: Internal Medicine

## 2022-07-04 DIAGNOSIS — D520 Dietary folate deficiency anemia: Secondary | ICD-10-CM

## 2022-07-04 DIAGNOSIS — K5904 Chronic idiopathic constipation: Secondary | ICD-10-CM

## 2022-07-10 ENCOUNTER — Other Ambulatory Visit: Payer: Self-pay | Admitting: Cardiovascular Disease

## 2022-07-15 ENCOUNTER — Ambulatory Visit (INDEPENDENT_AMBULATORY_CARE_PROVIDER_SITE_OTHER): Payer: No Typology Code available for payment source | Admitting: Internal Medicine

## 2022-07-15 ENCOUNTER — Encounter: Payer: Self-pay | Admitting: Internal Medicine

## 2022-07-15 VITALS — BP 130/78 | Ht 64.0 in | Wt 150.0 lb

## 2022-07-15 DIAGNOSIS — M6289 Other specified disorders of muscle: Secondary | ICD-10-CM | POA: Diagnosis not present

## 2022-07-15 DIAGNOSIS — K648 Other hemorrhoids: Secondary | ICD-10-CM | POA: Diagnosis not present

## 2022-07-15 DIAGNOSIS — N816 Rectocele: Secondary | ICD-10-CM | POA: Diagnosis not present

## 2022-07-15 DIAGNOSIS — K5904 Chronic idiopathic constipation: Secondary | ICD-10-CM

## 2022-07-15 MED ORDER — MOTEGRITY 2 MG PO TABS: 2.0000 mg | ORAL_TABLET | Freq: Every day | ORAL | 0 refills | Status: DC

## 2022-07-15 MED ORDER — HYDROCORTISONE (PERIANAL) 2.5 % EX CREA: 1.0000 | TOPICAL_CREAM | Freq: Two times a day (BID) | CUTANEOUS | 1 refills | Status: DC | PRN
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
## 2022-07-15 NOTE — Patient Instructions (Addendum)
We have sent the following medications to your pharmacy for you to pick up at your convenience: Motegrity  Please discontinue your linzess.   We are referring you to PT, they will reach out and get you scheduled.   We have you scheduled for a follow up on 09/19/2022 at 9:50am.  _______________________________________________________  If your blood pressure at your visit was 140/90 or greater, please contact your primary care physician to follow up on this.  _______________________________________________________  If you are age 55 or older, your body mass index should be between 23-30. Your Body mass index is 25.75 kg/m. If this is out of the aforementioned range listed, please consider follow up with your Primary Care Provider.  If you are age 55 or younger, your body mass index should be between 19-25. Your Body mass index is 25.75 kg/m. If this is out of the aformentioned range listed, please consider follow up with your Primary Care Provider.   ________________________________________________________  The [facility] providers would like to encourage you to use [facility] to communicate with providers for non-urgent requests or questions.  Due to long hold times on the telephone, sending your provider a message by [facility] may be a faster and more efficient way to get a response.  Please allow 48 business hours for a response.  Please remember that this is for non-urgent requests.  _______________________________________________________ It was a pleasure to see you today!  Thank you for trusting me with your gastrointestinal care!

## 2022-07-22 ENCOUNTER — Ambulatory Visit: Payer: No Typology Code available for payment source | Admitting: Internal Medicine

## 2022-07-29 ENCOUNTER — Ambulatory Visit: Payer: No Typology Code available for payment source | Admitting: Internal Medicine

## 2022-07-29 ENCOUNTER — Encounter: Payer: Self-pay | Admitting: Internal Medicine

## 2022-07-29 VITALS — BP 100/60 | HR 92 | Temp 98.6°F | Ht 64.0 in | Wt 151.0 lb

## 2022-07-29 DIAGNOSIS — E1143 Type 2 diabetes mellitus with diabetic autonomic (poly)neuropathy: Secondary | ICD-10-CM

## 2022-07-29 DIAGNOSIS — I950 Idiopathic hypotension: Secondary | ICD-10-CM | POA: Diagnosis not present

## 2022-07-29 DIAGNOSIS — D538 Other specified nutritional anemias: Secondary | ICD-10-CM | POA: Diagnosis not present

## 2022-07-29 DIAGNOSIS — E118 Type 2 diabetes mellitus with unspecified complications: Secondary | ICD-10-CM | POA: Diagnosis not present

## 2022-07-29 DIAGNOSIS — R931 Abnormal findings on diagnostic imaging of heart and coronary circulation: Secondary | ICD-10-CM

## 2022-07-29 DIAGNOSIS — E2740 Unspecified adrenocortical insufficiency: Secondary | ICD-10-CM

## 2022-07-29 NOTE — Progress Notes (Unsigned)
Subjective:  Patient ID: Carolyn [name], female    DOB: February 20, 1961  Age: 62 y.o. MRN: [id number]  CC: No chief complaint on file.   HPI ROSEALIE PAXSON presents for ***  Outpatient Medications Prior to Visit  Medication Sig Dispense Refill   acetaminophen (TYLENOL) 500 MG tablet Take 1,000 mg by mouth every 6 (six) hours as needed for mild pain or headache.     aspirin EC 81 MG tablet Take 81 mg by mouth daily. Swallow whole.     Blood Glucose Monitoring Suppl (ONETOUCH VERIO IQ SYSTEM) W/DEVICE KIT 1 Act by Does not apply route 3 (three) times daily. 2 kit 0   budesonide-formoterol (SYMBICORT) 80-4.5 MCG/ACT inhaler Inhale 2 puffs into the lungs 2 (two) times daily. 3 each 0   calcium carbonate (OS-CAL - DOSED IN MG OF ELEMENTAL CALCIUM) 1250 (500 Ca) MG tablet Take 2 tablets by mouth daily with breakfast.     carvedilol (COREG) 25 MG tablet Take 1 tablet by mouth twice daily 180 tablet 3   cholecalciferol (VITAMIN D3) 25 MCG (1000 UNIT) tablet Take 2,000 Units by mouth daily.     clonazePAM (KLONOPIN) 1 MG tablet Take 1 tablet (1 mg total) by mouth 2 (two) times daily as needed for anxiety. 180 tablet 0   ENTRESTO 97-103 MG Take 1 tablet by mouth twice daily 60 tablet 8   EPINEPHrine 0.3 mg/0.3 mL IJ SOAJ injection Inject 0.3 mg into the muscle as needed for anaphylaxis. 1 each 0   fenofibrate (TRICOR) 145 MG tablet Take 1 tablet (145 mg total) by mouth daily. 30 tablet 11   fluticasone (FLONASE) 50 MCG/ACT nasal spray Place 1 spray into both nostrils daily as needed for allergies or rhinitis.     folic acid (FOLVITE) 1 MG tablet Take 1 tablet by mouth daily 90 tablet 0   furosemide (LASIX) 20 MG tablet Take 2 tablets (40 mg total) by mouth daily. 3 tablet 0   glucose blood (ONE TOUCH ULTRA TEST) test strip Use to test blood sugar three times a week and prn if having symptoms of low blood sugar Dx: 250.92 90 day supply 100 each 11   hydrALAZINE (APRESOLINE) 50 MG tablet TAKE 1 TABLET  BY MOUTH THREE TIMES DAILY 270 tablet 3   hydrocortisone (ANUSOL-HC) 2.5 % rectal cream Place 1 Application rectally 2 (two) times daily as needed for hemorrhoids or anal itching. 30 g 1   Insulin Pen Needle (BD PEN NEEDLE MICRO U/F) 32G X 6 MM MISC Inject 1 Units into the skin once a week. 100 each 3   iron polysaccharides (NIFEREX) 150 MG capsule Take 150 mg by mouth daily.     isosorbide dinitrate (ISORDIL) 20 MG tablet TAKE 1 TABLET BY MOUTH THREE TIMES DAILY 270 tablet 3   levocetirizine (XYZAL) 5 MG tablet Take 5 mg by mouth every evening.     lidocaine (LIDODERM) 5 % Place 1 patch onto the skin daily. Remove & Discard patch within 12 hours or as directed by MD 30 patch 0   Multiple Vitamins-Minerals (HAIR SKIN AND NAILS FORMULA) TABS Take 1 tablet by mouth daily.     omega-3 acid ethyl esters (LOVAZA) 1 g capsule Take 2 capsules by mouth twice daily 360 capsule 3   omeprazole (PRILOSEC) 40 MG capsule TAKE 1 CAPSULE BY MOUTH TWICE DAILY AT  8  AM  AND  10  PM 180 capsule 0   Prucalopride Succinate (MOTEGRITY) 2 MG TABS Take  1 tablet (2 mg total) by mouth daily. 90 tablet 0   rosuvastatin (CRESTOR) 20 MG tablet Take 1 tablet (20 mg total) by mouth daily. 90 tablet 3   Semaglutide, 2 MG/DOSE, (OZEMPIC, 2 MG/DOSE,) 8 MG/3ML SOPN INJECT 2 MG  SUBCUTANEOUSLY ONCE A WEEK 3 mL 11   spironolactone (ALDACTONE) 25 MG tablet Take 1 tablet by mouth once daily 90 tablet 2   tiZANidine (ZANAFLEX) 2 MG tablet Take 1 tablet (2 mg total) by mouth at bedtime as needed for muscle spasms. 30 tablet 0   zinc gluconate 50 MG tablet Take 1 tablet (50 mg total) by mouth daily. 90 tablet 1   No facility-administered medications prior to visit.    ROS Review of Systems  Objective:  BP 100/60 (BP Location: Left Arm, Patient Position: Sitting, Cuff Size: Large)   Pulse 92   Temp 98.6 F (37 C) (Oral)   Ht  (1.626 m)   Wt 151 lb (68.5 kg)   SpO2 94%   BMI 25.92 kg/m   BP Readings from Last 3  Encounters:  07/29/22 100/60  07/15/22 130/78  07/01/22 108/68    Wt Readings from Last 3 Encounters:  07/29/22 151 lb (68.5 kg)  07/15/22 150 lb (68 kg)  07/01/22 152 lb (68.9 kg)    Physical Exam Cardiovascular:     Rate and Rhythm: Normal rate and regular rhythm.     Heart sounds: Normal heart sounds, S1 normal and S2 normal. No murmur heard.    Comments: EKG- NSR, 78 bpm Minimal LVH Anterior infarct pattern is old unchanged Musculoskeletal:     Right lower leg: No edema.     Left lower leg: No edema.     Lab Results  Component Value Date   WBC 5.2 07/01/2022   HGB 10.3 (L) 07/01/2022   HCT 30.6 (L) 07/01/2022   PLT 242.0 07/01/2022   GLUCOSE 80 07/01/2022   CHOL 127 08/16/2021   TRIG 83 08/16/2021   HDL 39 (L) 08/16/2021   LDLDIRECT 84 03/16/2021   LDLCALC 72 08/16/2021   ALT 11 07/01/2022   AST 12 07/01/2022   NA 140 07/01/2022   K 3.7 07/01/2022   CL 105 07/01/2022   CREATININE 1.15 07/01/2022   BUN 19 07/01/2022   CO2 27 07/01/2022   TSH 2.06 07/01/2022   INR 1.04 01/14/2013   HGBA1C 5.5 07/01/2022   MICROALBUR 19.5 (H) 01/15/2022    DG Chest 2 View  Result Date: 06/30/2022 CLINICAL DATA:  Shortness of breath. EXAM: CHEST - 2 VIEW COMPARISON:  05/31/2021 FINDINGS: Lungs are adequately inflated with minimal patchy hazy density in the lung bases which may be due to atelectasis/vascular crowding although early infection is possible. No effusion. Cardiomediastinal silhouette and remainder of the exam is unchanged. IMPRESSION: Minimal patchy hazy density in the lung bases which may be due to atelectasis/vascular crowding, although early infection is possible. Electronically Signed   By: Elberta Fortis M.D.   On: 06/30/2022 09:25    Assessment & Plan:  Idiopathic hypotension -     CBC with Differential/Platelet; Future -     Cortisol; Future -     Basic metabolic panel; Future -     Troponin I (High Sensitivity); Future -     Brain natriuretic peptide;  Future -     EKG 12-Lead  Anemia due to zinc deficiency -     CBC with Differential/Platelet; Future  Decreased cardiac ejection fraction -  Troponin I (High Sensitivity); Future -     Brain natriuretic peptide; Future  Gastroparesis due to DM  Type II diabetes mellitus with manifestations -     HM Diabetes Foot Exam     Follow-up: Return in about 3 months (around 10/28/2022).  Sanda Linger, MD

## 2022-07-29 NOTE — Patient Instructions (Signed)
Heart Failure, Diagnosis  Heart failure is a condition in which the heart has trouble pumping blood. This may mean that the heart cannot pump enough blood out to the body or that the heart does not fill up with enough blood. For some people with heart failure, fluid may back up into the lungs. There may also be swelling (edema) in the lower legs. Heart failure is usually a long-term (chronic) condition. It is important for you to take good care of yourself and follow the treatment plan from your health care provider. Different stages of heart failure have different treatment plans. The stages are: Stage A: At risk for heart failure. Having no symptoms of heart failure, but being at risk for developing heart failure. Stage B: Pre-heart failure. Having no symptoms of heart failure, but having structural changes to the heart that indicate heart failure. Stage C: Symptomatic heart failure. Having symptoms of heart failure in addition to structural changes to the heart that indicate heart failure. Stage D: Advanced heart failure. Having symptoms that interfere with daily life and frequent hospitalizations related to heart failure. What are the causes? This condition may be caused by: High blood pressure (hypertension). Hypertension causes the heart muscle to work harder than normal. Coronary artery disease, or CAD. CAD is the buildup of cholesterol and fat (plaque) in the arteries of the heart. Heart attack, also called myocardial infarction. This injures the heart muscle, making it hard for the heart to pump blood. Abnormal heart valves. The valves do not open and close properly, forcing the heart to pump harder to keep the blood flowing. Heart muscle disease, inflammation, or infection (cardiomyopathy or myocarditis). This is damage to the heart muscle. It can increase the risk of heart failure. Lung disease. The heart works harder when the lungs are not healthy. What increases the risk? The risk  of heart failure increases as a person ages. This condition is also more likely to develop in people who: Are obese. Use tobacco or nicotine products. Abuse alcohol or drugs. Have taken medicines that can damage the heart, such as chemotherapy drugs. Have any of these conditions: Diabetes. Abnormal heart rhythms. Thyroid problems. Low blood counts (anemia). Chronic kidney disease. Have a family history of heart failure. What are the signs or symptoms? Symptoms of this condition include: Shortness of breath with activity, such as when climbing stairs. A cough that does not go away. Swelling of the feet, ankles, legs, or abdomen. Losing or gaining weight for no reason. Trouble breathing when lying flat. Waking from sleep because of the need to sit up and get more air. Rapid heartbeat. Other symptoms may include: Tiredness (fatigue) and loss of energy. Feeling light-headed, dizzy, or close to fainting. Nausea or loss of appetite. Waking up more often during the night to urinate (nocturia). Confusion. How is this diagnosed? This condition is diagnosed based on: Your medical history, symptoms, and a physical exam. Blood tests. Diagnostic tests, which may include: Echocardiogram. Electrocardiogram (ECG). Chest X-ray. Exercise stress test. Cardiac MRI. Cardiac catheterization and angiogram. Radionuclide scans. How is this treated? Treatment for this condition is aimed at managing the symptoms of heart failure. Medicines Treatment may include medicines that: Help lower blood pressure by relaxing (dilating) the blood vessels. These medicines are called ACE inhibitors (angiotensin-converting enzyme), ARBs (angiotensin receptor blockers), or vasodilators. Cause the kidneys to remove salt and water from the blood through urination (diuretics). Improve heart muscle strength and prevent the heart from beating too fast (beta blockers). Increase the   force of the heartbeat  (digoxin). Lower heart rates. Certain diabetes medicines (SGLT-2 inhibitors) may also be used in treatment. Healthy behavior changes Treatment may also include making healthy lifestyle changes, such as: Reaching and staying at a healthy weight. Not using tobacco or nicotine products. Eating heart-healthy foods. Limiting or avoiding alcohol. Stopping the use of illegal drugs. Being physically active. Participating in a cardiac rehabilitation program, which is a treatment program to improve your health and well-being through exercise training, education, and counseling. Other treatments Other treatments may include: Procedures to open blocked arteries or repair damaged valves. Placing a pacemaker to improve heart function (cardiac resynchronization therapy). Placing a device to treat serious abnormal heart rhythms (implantable cardioverter defibrillator, or ICD). Placing a device to improve the pumping ability of the heart (left ventricular assist device, or LVAD). Receiving a healthy heart from a donor (heart transplant). This is done when other treatments have not helped. Follow these instructions at home: Manage other health conditions as told by your health care provider. These may include hypertension, diabetes, thyroid disease, or abnormal heart rhythms. Get ongoing education and support as needed. Learn as much as you can about heart failure. Keep all follow-up visits. This is important. Where to find more information American Heart Association: www.heart.org Centers for Disease Control and Prevention: www.cdc.gov NIH National Institute on Aging: www.nia.nih.gov Summary Heart failure is a condition in which the heart has trouble pumping blood. This condition is commonly caused by high blood pressure and other diseases of the heart and lungs. Symptoms of this condition include shortness of breath, tiredness (fatigue), nausea, and swelling of the feet, ankles, legs, or  abdomen. Treatments for this condition may include medicines, lifestyle changes, and surgery. Manage other health conditions as told by your health care provider. This information is not intended to replace advice given to you by your health care provider. Make sure you discuss any questions you have with your health care provider. Document Revised: 07/10/2021 Document Reviewed: 10/23/2019 Elsevier Patient Education  2023 Elsevier Inc.  

## 2022-07-30 ENCOUNTER — Encounter: Payer: Self-pay | Admitting: Cardiovascular Disease

## 2022-07-30 DIAGNOSIS — E2740 Unspecified adrenocortical insufficiency: Secondary | ICD-10-CM | POA: Insufficient documentation

## 2022-07-30 LAB — CBC WITH DIFFERENTIAL/PLATELET
Basophils Absolute: 0 10*3/uL (ref 0.0–0.1)
Basophils Relative: 0.4 % (ref 0.0–3.0)
Eosinophils Absolute: 0.2 10*3/uL (ref 0.0–0.7)
Eosinophils Relative: 3.4 % (ref 0.0–5.0)
HCT: 29.9 % — ABNORMAL LOW (ref 36.0–46.0)
Hemoglobin: 10 g/dL — ABNORMAL LOW (ref 12.0–15.0)
Lymphocytes Relative: 46.3 % — ABNORMAL HIGH (ref 12.0–46.0)
Lymphs Abs: 2.3 10*3/uL (ref 0.7–4.0)
MCHC: 33.6 g/dL (ref 30.0–36.0)
MCV: 93.8 fl (ref 78.0–100.0)
Monocytes Absolute: 0.4 10*3/uL (ref 0.1–1.0)
Monocytes Relative: 7.3 % (ref 3.0–12.0)
Neutro Abs: 2.1 10*3/uL (ref 1.4–7.7)
Neutrophils Relative %: 42.6 % — ABNORMAL LOW (ref 43.0–77.0)
Platelets: 277 10*3/uL (ref 150.0–400.0)
RBC: 3.19 Mil/uL — ABNORMAL LOW (ref 3.87–5.11)
RDW: 12.7 % (ref 11.5–15.5)
WBC: 4.9 10*3/uL (ref 4.0–10.5)

## 2022-07-30 LAB — BASIC METABOLIC PANEL
BUN: 19 mg/dL (ref 6–23)
CO2: 26 mEq/L (ref 19–32)
Calcium: 9 mg/dL (ref 8.4–10.5)
Chloride: 107 mEq/L (ref 96–112)
Creatinine, Ser: 1.03 mg/dL (ref 0.40–1.20)
GFR: 59.12 mL/min — ABNORMAL LOW (ref >60.00)
Glucose, Bld: 98 mg/dL (ref 70–99)
Potassium: 3.7 mEq/L (ref 3.5–5.1)
Sodium: 141 mEq/L (ref 135–145)

## 2022-07-30 LAB — CORTISOL: Cortisol, Plasma: 3.3 ug/dL

## 2022-07-30 LAB — BRAIN NATRIURETIC PEPTIDE: Pro B Natriuretic peptide (BNP): 25 pg/mL (ref 0.0–100.0)

## 2022-07-30 LAB — TROPONIN I (HIGH SENSITIVITY): High Sens Troponin I: 7 ng/L (ref 2–17)

## 2022-07-30 MED ORDER — HYDROCORTISONE 10 MG PO TABS
10.0000 mg | ORAL_TABLET | Freq: Two times a day (BID) | ORAL | 0 refills | Status: DC
Start: 2022-07-30 — End: 2023-03-05

## 2022-08-01 ENCOUNTER — Ambulatory Visit: Payer: No Typology Code available for payment source | Admitting: Family Medicine

## 2022-08-04 ENCOUNTER — Other Ambulatory Visit: Payer: Self-pay | Admitting: Internal Medicine

## 2022-08-04 DIAGNOSIS — K2100 Gastro-esophageal reflux disease with esophagitis, without bleeding: Secondary | ICD-10-CM

## 2022-08-05 ENCOUNTER — Other Ambulatory Visit: Payer: Self-pay | Admitting: Cardiovascular Disease

## 2022-08-05 ENCOUNTER — Other Ambulatory Visit: Payer: Self-pay | Admitting: Internal Medicine

## 2022-08-05 DIAGNOSIS — F410 Panic disorder [episodic paroxysmal anxiety] without agoraphobia: Secondary | ICD-10-CM

## 2022-08-05 NOTE — Telephone Encounter (Signed)
Rx request sent to pharmacy.  
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
## 2022-08-20 ENCOUNTER — Ambulatory Visit (INDEPENDENT_AMBULATORY_CARE_PROVIDER_SITE_OTHER): Payer: No Typology Code available for payment source | Admitting: Family Medicine

## 2022-08-20 VITALS — BP 116/76 | HR 58 | Ht 64.0 in | Wt 157.0 lb

## 2022-08-20 DIAGNOSIS — M5416 Radiculopathy, lumbar region: Secondary | ICD-10-CM | POA: Diagnosis not present

## 2022-08-20 MED ORDER — KETOROLAC TROMETHAMINE 30 MG/ML IJ SOLN
30.0000 mg | Freq: Once | INTRAMUSCULAR | Status: AC
Start: 2022-08-20 — End: 2022-08-20
  Administered 2022-08-20: 30 mg via INTRAMUSCULAR

## 2022-08-20 MED ORDER — METHYLPREDNISOLONE ACETATE 40 MG/ML IJ SUSP
40.0000 mg | Freq: Once | INTRAMUSCULAR | Status: AC
Start: 2022-08-20 — End: 2022-08-20
  Administered 2022-08-20: 40 mg via INTRAMUSCULAR

## 2022-08-20 NOTE — Assessment & Plan Note (Signed)
Chronic problem with exacerbation.  Worsening left-sided radicular symptoms.  Discussed different medications which can be difficult.  Given cocktail today to see if this would be beneficial including Toradol and Depo-Medrol.  Given home exercises.  Patient will be traveling and we discussed the potential for prednisone but will need to monitor patient's weight.  Patient has been on hydrocortisone and would like to avoid that if possible.  Worsening pain would need to consider new MRI.  Patient is in agreement with the plan we will continue to monitor her weight.  Follow-up with me again in 2 to 4 weeks.

## 2022-08-20 NOTE — Patient Instructions (Addendum)
Injections in backside If worsening pain, will get MRI  Exercises for your back See me in 6-8 weeks

## 2022-08-20 NOTE — Addendum Note (Signed)
Addended by: Debbe Odea R on: 08/20/2022 12:00 PM   Modules accepted: Orders

## 2022-08-29 ENCOUNTER — Encounter: Payer: Self-pay | Admitting: Physical Therapy

## 2022-08-29 ENCOUNTER — Ambulatory Visit: Payer: No Typology Code available for payment source | Attending: Internal Medicine | Admitting: Physical Therapy

## 2022-08-29 ENCOUNTER — Other Ambulatory Visit: Payer: Self-pay

## 2022-08-29 DIAGNOSIS — R293 Abnormal posture: Secondary | ICD-10-CM | POA: Insufficient documentation

## 2022-08-29 DIAGNOSIS — M6281 Muscle weakness (generalized): Secondary | ICD-10-CM | POA: Diagnosis present

## 2022-08-29 DIAGNOSIS — M62838 Other muscle spasm: Secondary | ICD-10-CM | POA: Insufficient documentation

## 2022-08-29 NOTE — Therapy (Signed)
OUTPATIENT PHYSICAL THERAPY FEMALE PELVIC EVALUATION   Patient Name: Carolyn [name] MRN: [id number] DOB:1961/01/28, 62 y.o., female Today's Date: 08/30/2022  END OF SESSION:  PT End of Session - 08/29/22 1647     Visit Number 1    Date for PT Re-Evaluation 11/21/22    Authorization Type [employer]    PT Start Time 1618    PT Stop Time 1655    PT Time Calculation (min) 37 min    Activity Tolerance Patient tolerated treatment well    Behavior During Therapy [facility] for tasks assessed/performed             Past Medical History:  Diagnosis Date   Allergy    seasonal   Anemia    Anemia of chronic disease suspected   Anxiety attack    Arthritis    Asthmatic bronchitis 04/26/2021   Benign fundic gland polyps of stomach    Dyslipidemia    Exertional shortness of breath    GERD (gastroesophageal reflux disease)    Heart murmur    Hyperlipidemia    Hypertension    Migraines    "probably weekly" (01/14/2013)   Type II diabetes mellitus (HCC)    Past Surgical History:  Procedure Laterality Date   CHOLECYSTECTOMY  1990   COLONOSCOPY     ENDOMETRIAL ABLATION  ~ 2000-2002   "twice" (01/14/2013)   LEFT HEART CATH AND CORONARY ANGIOGRAPHY N/A 06/01/2021   Procedure: LEFT HEART CATH AND CORONARY ANGIOGRAPHY;  Surgeon: Lyn Records, MD;  Location: [facility];  Service: Cardiovascular;  Laterality: N/A;   LEFT HEART CATHETERIZATION WITH CORONARY ANGIOGRAM N/A 01/15/2013   Procedure: LEFT HEART CATHETERIZATION WITH CORONARY ANGIOGRAM;  Surgeon: Micheline Chapman, MD;  [facility] CATH LAB;  Service: Cardiovascular;  Laterality: N/A;   LEFT OOPHORECTOMY Left ~ 2003   ORIF TIBIA & FIBULA FRACTURES Left 2003   VAGINAL HYSTERECTOMY  ~ 2003   Patient Active Problem List   Diagnosis Date Noted   Adrenal insufficiency (HCC) 07/30/2022   Idiopathic hypotension 07/29/2022   Anemia due to zinc deficiency 01/19/2022   Dietary folate deficiency anemia 01/16/2022   Lumbar paraspinal muscle  spasm 01/10/2022   Degenerative arthritis of left knee 08/09/2021   Chronic bursitis of left shoulder 07/04/2020   Non-seasonal allergic rhinitis 05/08/2020   Generalized anxiety disorder with panic attacks 01/11/2020   Gastroparesis due to DM (HCC) 11/09/2019   Intractable migraine without aura and with status migrainosus 09/09/2019   Patellofemoral arthritis of right knee 05/05/2019   Greater trochanteric bursitis of left hip 02/22/2019   Anemia in other chronic diseases classified elsewhere 07/29/2017   Anemia due to acquired thiamine deficiency 09/18/2016   Insomnia secondary to depression with anxiety 12/12/2015   Slow transit constipation 10/19/2015   Hypertriglyceridemia 05/25/2015   Routine general medical examination at a health care facility 05/25/2015   Visit for screening mammogram 10/27/2014   Left lumbar radiculitis 10/23/2013   Obesity (BMI 30-39.9) 09/20/2013   Decreased cardiac ejection fraction 09/17/2013   Snoring 07/30/2013   Type II diabetes mellitus with manifestations (HCC) 12/02/2012   GERD (gastroesophageal reflux disease)    Hyperlipidemia with target LDL less than 100    Allergic rhinitis 07/22/2010   Essential hypertension 05/25/2010    PCP: Etta Grandchild, MD   REFERRING PROVIDER:   Iva Boop, MD    REFERRING DIAG:  [id number] (ICD-10-CM) - Chronic idiopathic constipation  M62.89 (ICD-10-CM) - Pelvic floor dysfunction  N81.6 (ICD-10-CM) -  Rectocele  K64.8 (ICD-10-CM) - Hemorrhoids with complication    THERAPY DIAG:  Muscle weakness (generalized)  Other muscle spasm  Abnormal posture  Rationale for Evaluation and Treatment: Rehabilitation  ONSET DATE: 6 months  SUBJECTIVE:                                                                                                                                                                                           SUBJECTIVE STATEMENT: Pt is having a lot of grumbling.  Happens at any time  without warning or trigger. Pt states she is worried something is blocking things, but also reports she can have bowel movement daily and it is soft when taking the linsess Fluid intake: Yes: lot of water    PAIN:  Are you having pain? No  PRECAUTIONS: None  WEIGHT BEARING RESTRICTIONS: No  FALLS:  Has patient fallen in last 6 months? 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[employer]  PLOF: Independent  PATIENT GOALS: not have grumbling in stomach  PERTINENT HISTORY:  2 vaginal deliveries, hemorrhoids Sexual abuse: No  BOWEL MOVEMENT: Pain with bowel movement: No Type of bowel movement:Strain No Fully empty rectum: Yes: when taking linsess Leakage: No Pads: No Fiber supplement: No  URINATION: Pain with urination: No Fully empty bladder: Yes:   Stream: Strong Urgency: No Frequency:  Leakage:  no Pads: No  INTERCOURSE: Pain with intercourse:    PREGNANCY: Vaginal deliveries 2 Tearing Yes: 2nd   PROLAPSE: Rectocele     OBJECTIVE:   DIAGNOSTIC FINDINGS:    PATIENT SURVEYS:    PFIQ-7   COGNITION: Overall cognitive status: Within functional limits for tasks assessed     SENSATION: Light touch:  Proprioception:   MUSCLE LENGTH: Hamstrings: Right 80 deg; Left 80 deg   LUMBAR SPECIAL TESTS:  Single leg - right side trendelenburg  FUNCTIONAL TESTS:  ASLR compression felt better  GAIT:  Comments: WFL   POSTURE: increased lumbar lordosis, increased thoracic kyphosis, and anterior pelvic tilt  PELVIC ALIGNMENT: normal  LUMBARAROM/PROM:  A/PROM A/PROM  eval  Flexion 75%  Extension   Right lateral flexion   Left lateral flexion   Right rotation   Left rotation    (Blank rows = not tested)  LOWER EXTREMITY ROM:  Passive ROM Right eval Left eval  Hip flexion 75% 75%  Hip extension    Hip abduction    Hip adduction    Hip internal rotation 100 100%  Hip external  rotation 75% 75%  Knee flexion    Knee extension    Ankle dorsiflexion  Ankle plantarflexion    Ankle inversion    Ankle eversion     (Blank rows = not tested)  LOWER EXTREMITY MMT:  MMT Right eval Left eval  Hip flexion    Hip extension    Hip abduction 4/5   Hip adduction    Hip internal rotation    Hip external rotation    Knee flexion    Knee extension    Ankle dorsiflexion    Ankle plantarflexion    Ankle inversion    Ankle eversion     PALPATION:   General  lumbar and glutealstight and Rt piriformis tender                External Perineal Exam normal appearance                             Internal Pelvic Floor thickened internal anal sphincter and puborectalis, EAS low endurance, slow to relax and holding tension and some dyssynergia when attempts to push  Patient confirms identification and approves PT to assess internal pelvic floor and treatment Yes  PELVIC MMT: endurance 5 sec, 1 rep   MMT eval  Vaginal   Internal Anal Sphincter 2-3/5  External Anal Sphincter 2-3/5  Puborectalis 2-3/5  Diastasis Recti   (Blank rows = not tested)        TONE: normal  PROLAPSE:   TODAY'S TREATMENT:                                                                                                                              DATE: 08/29/22  EVAL bowel retraining techniques and toileting techniques   PATIENT EDUCATION:  Education details:  bowel retraining techniques and toileting techniques Person educated: Patient Education method: Explanation, Demonstration, Tactile cues, Verbal cues, and Handouts Education comprehension: verbalized understanding  HOME EXERCISE PROGRAM: Not issued today  ASSESSMENT:  CLINICAL IMPRESSION: Patient is a 62 y.o. female who was seen today for physical therapy evaluation and treatment for excess gas/constipation. Pt does have some pelvic floor weakness, decreased coordination, and slowness to relax the pelvic floor muscles as  described above.  Pt has weakness of the right hip and tension throughout the posterior muscle chain.  Pt is expected to benefit from skilled PT to address the above issues and impairments which can effect the functioning of the GI system. Pt is recommended skilled PT for improved functional activities and bowel health.  OBJECTIVE IMPAIRMENTS: decreased coordination, decreased endurance, decreased strength, increased muscle spasms, impaired flexibility, impaired tone, and postural dysfunction.   ACTIVITY LIMITATIONS: toileting  PARTICIPATION LIMITATIONS: community activity  PERSONAL FACTORS: Time since onset of injury/illness/exacerbation and 1-2 comorbidities: 2 vaginal deliveries, hemorrhoids  are also affecting patient's functional outcome.   REHAB POTENTIAL: Excellent  CLINICAL DECISION MAKING: Stable/uncomplicated  EVALUATION COMPLEXITY: Low   GOALS: Goals reviewed with patient? Yes  SHORT TERM GOALS: Target date: 09/26/22  Ind  with initial HEP Baseline: Goal status: INITIAL  2.  Pt will be able to perform 10 quick flicks of pelvic floor for improved function and support with functional lifting or coughing. Baseline:  Goal status: INITIAL  3.  Pt will report noticeably less gas noises in a typical week. Baseline:  Goal status: INITIAL    LONG TERM GOALS: Target date: 11/21/22  Pt will be independent with advanced HEP to maintain improvements made throughout therapy  Baseline:  Goal status: INITIAL  2.  Pt will be able to do social activities without intestinal noises interrupting Baseline:  Goal status: INITIAL  3.  Pt will report only occasional intestinal distress and noises of less than 1/week  Baseline:  Goal status: INITIAL  4.  Pt will demonstrate single leg stand without trendelenburg due to 5/5 bil hip strength in order to improve stability during functional activities. Baseline:  Goal status: INITIAL    PLAN:  PT FREQUENCY: 1x/week  PT DURATION:  12 weeks  PLANNED INTERVENTIONS: Therapeutic exercises, Therapeutic activity, Neuromuscular re-education, Balance training, Gait training, Patient/Family education, Self Care, Joint mobilization, Dry Needling, Electrical stimulation, Cryotherapy, Moist heat, Taping, Biofeedback, Manual therapy, and Re-evaluation  PLAN FOR NEXT SESSION: transperineal for puborectal angle and puborectalis/posterior pelvic floor (pt wants to see what is happening)   Junious Silk, PT 08/30/2022, 11:40 AM

## 2022-08-30 ENCOUNTER — Ambulatory Visit: Payer: No Typology Code available for payment source | Admitting: Family Medicine

## 2022-09-02 ENCOUNTER — Ambulatory Visit: Payer: No Typology Code available for payment source | Admitting: Internal Medicine

## 2022-09-19 ENCOUNTER — Encounter: Payer: Self-pay | Admitting: Internal Medicine

## 2022-09-19 ENCOUNTER — Ambulatory Visit: Payer: No Typology Code available for payment source | Admitting: Internal Medicine

## 2022-09-19 VITALS — BP 110/72 | HR 100 | Ht 64.0 in | Wt 156.5 lb

## 2022-09-19 DIAGNOSIS — K5904 Chronic idiopathic constipation: Secondary | ICD-10-CM | POA: Diagnosis not present

## 2022-09-19 DIAGNOSIS — N816 Rectocele: Secondary | ICD-10-CM

## 2022-09-19 DIAGNOSIS — M6289 Other specified disorders of muscle: Secondary | ICD-10-CM

## 2022-09-19 MED ORDER — LINACLOTIDE 290 MCG PO CAPS: 290.0000 ug | ORAL_CAPSULE | Freq: Every day | ORAL | 0 refills | Status: DC

## 2022-09-19 MED ORDER — MOTEGRITY 2 MG PO TABS: 2.0000 mg | ORAL_TABLET | Freq: Every day | ORAL | 0 refills | Status: DC

## 2022-09-19 NOTE — Patient Instructions (Signed)
We are providing you with samples today for Motegrity 2mg  and Linzess . Try the Motegrity 2mg  first: Take one tablet daily. Message [place] if its works. If no better then try the Linzess . Take one daily before breakfast.   For your information:  You have been given a testing kit to check for small intestine bacterial overgrowth (SIBO) which is completed by a company named [facility]. Make sure to return your test in the mail using the return mailing label given to you along with the kit. The test order, your demographic and insurance information have all already been sent to the company. [facility] will collect an upfront charge of $99.74 for commercial insurance plans and $209.74 is you are paying cash. Make sure to discuss with [facility] PRIOR to having the test to see if they have gotten information from your insurance company as to how much your testing will cost out of pocket, if any. Please contact [facility] at phone number [phone] to get instructions regarding how to perform the test as our office is unable to give specific testing instructions.   I appreciate the opportunity to care for you. Stan Head, MD, [facility]
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
## 2022-09-30 ENCOUNTER — Telehealth: Payer: Self-pay | Admitting: Physical Therapy

## 2022-09-30 ENCOUNTER — Ambulatory Visit: Payer: No Typology Code available for payment source | Attending: Internal Medicine | Admitting: Physical Therapy

## 2022-09-30 DIAGNOSIS — R293 Abnormal posture: Secondary | ICD-10-CM | POA: Insufficient documentation

## 2022-09-30 DIAGNOSIS — M62838 Other muscle spasm: Secondary | ICD-10-CM | POA: Insufficient documentation

## 2022-09-30 DIAGNOSIS — M6281 Muscle weakness (generalized): Secondary | ICD-10-CM | POA: Insufficient documentation

## 2022-09-30 NOTE — Telephone Encounter (Signed)
Patient did not show for appointment.  Patient was called and stated they left a voicemail.  Front office did not have a message.  Pt did reschedule appt.  Russella Dar, PT, DPT 09/30/22 9:21 AM

## 2022-09-30 NOTE — Therapy (Deleted)
OUTPATIENT PHYSICAL THERAPY FEMALE PELVIC Treatment   Patient Name: Carolyn [name] MRN: [id number] DOB:27-Oct-1960, 62 y.o., female Today's Date: 09/30/2022  END OF SESSION:    Past Medical History:  Diagnosis Date   Allergy    seasonal   Anemia    Anemia of chronic disease suspected   Anxiety attack    Arthritis    Asthmatic bronchitis 04/26/2021   Benign fundic gland polyps of stomach    Dyslipidemia    Exertional shortness of breath    GERD (gastroesophageal reflux disease)    Heart murmur    Hyperlipidemia    Hypertension    Migraines    "probably weekly" (01/14/2013)   Type II diabetes mellitus (HCC)    Past Surgical History:  Procedure Laterality Date   CHOLECYSTECTOMY  1990   COLONOSCOPY     ENDOMETRIAL ABLATION  ~ 2000-2002   "twice" (01/14/2013)   LEFT HEART CATH AND CORONARY ANGIOGRAPHY N/A 06/01/2021   Procedure: LEFT HEART CATH AND CORONARY ANGIOGRAPHY;  Surgeon: Lyn Records, MD;  Location: [facility];  Service: Cardiovascular;  Laterality: N/A;   LEFT HEART CATHETERIZATION WITH CORONARY ANGIOGRAM N/A 01/15/2013   Procedure: LEFT HEART CATHETERIZATION WITH CORONARY ANGIOGRAM;  Surgeon: Micheline Chapman, MD;  Location[facility] CATH LAB;  Service: Cardiovascular;  Laterality: N/A;   LEFT OOPHORECTOMY Left ~ 2003   ORIF TIBIA & FIBULA FRACTURES Left 2003   VAGINAL HYSTERECTOMY  ~ 2003   Patient Active Problem List   Diagnosis Date Noted   Adrenal insufficiency (HCC) 07/30/2022   Idiopathic hypotension 07/29/2022   Anemia due to zinc deficiency 01/19/2022   Dietary folate deficiency anemia 01/16/2022   Lumbar paraspinal muscle spasm 01/10/2022   Degenerative arthritis of left knee 08/09/2021   Chronic bursitis of left shoulder 07/04/2020   Non-seasonal allergic rhinitis 05/08/2020   Generalized anxiety disorder with panic attacks 01/11/2020   Gastroparesis due to DM (HCC) 11/09/2019   Intractable migraine without aura and with status migrainosus  09/09/2019   Patellofemoral arthritis of right knee 05/05/2019   Greater trochanteric bursitis of left hip 02/22/2019   Anemia in other chronic diseases classified elsewhere 07/29/2017   Anemia due to acquired thiamine deficiency 09/18/2016   Insomnia secondary to depression with anxiety 12/12/2015   Slow transit constipation 10/19/2015   Hypertriglyceridemia 05/25/2015   Routine general medical examination at a health care facility 05/25/2015   Visit for screening mammogram 10/27/2014   Left lumbar radiculitis 10/23/2013   Obesity (BMI 30-39.9) 09/20/2013   Decreased cardiac ejection fraction 09/17/2013   Snoring 07/30/2013   Type II diabetes mellitus with manifestations (HCC) 12/02/2012   GERD (gastroesophageal reflux disease)    Hyperlipidemia with target LDL less than 100    Allergic rhinitis 07/22/2010   Essential hypertension 05/25/2010    PCP: Etta Grandchild, MD   REFERRING PROVIDER:   Iva Boop, MD    REFERRING DIAG:  K59.04 (ICD-10-CM) - Chronic idiopathic constipation  M62.89 (ICD-10-CM) - Pelvic floor dysfunction  N81.6 (ICD-10-CM) - Rectocele  K64.8 (ICD-10-CM) - Hemorrhoids with complication    THERAPY DIAG:  No diagnosis found.  Rationale for Evaluation and Treatment: Rehabilitation  ONSET DATE: 6 months  SUBJECTIVE:  SUBJECTIVE STATEMENT: Pt is having a lot of grumbling.  Happens at any time without warning or trigger. Pt states she is worried something is blocking things, but also reports she can have bowel movement daily and it is soft when taking the linsess Fluid intake: Yes: lot of water    PAIN:  Are you having pain? No  PRECAUTIONS: None  WEIGHT BEARING RESTRICTIONS: No  FALLS:  Has patient fallen in last 6 months? No  LIVING ENVIRONMENT: Lives  with: lives alone Lives in: House/apartment   OCCUPATION: full desk job for [employer]  PLOF: Independent  PATIENT GOALS: not have grumbling in stomach  PERTINENT HISTORY:  2 vaginal deliveries, hemorrhoids Sexual abuse: No  BOWEL MOVEMENT: Pain with bowel movement: No Type of bowel movement:Strain No Fully empty rectum: Yes: when taking linsess Leakage: No Pads: No Fiber supplement: No  URINATION: Pain with urination: No Fully empty bladder: Yes:   Stream: Strong Urgency: No Frequency:  Leakage:  no Pads: No  INTERCOURSE: Pain with intercourse:    PREGNANCY: Vaginal deliveries 2 Tearing Yes: 2nd   PROLAPSE: Rectocele     OBJECTIVE:   DIAGNOSTIC FINDINGS:    PATIENT SURVEYS:    PFIQ-7   COGNITION: Overall cognitive status: Within functional limits for tasks assessed     SENSATION: Light touch:  Proprioception:   MUSCLE LENGTH: Hamstrings: Right 80 deg; Left 80 deg   LUMBAR SPECIAL TESTS:  Single leg - right side trendelenburg  FUNCTIONAL TESTS:  ASLR compression felt better  GAIT:  Comments: WFL   POSTURE: increased lumbar lordosis, increased thoracic kyphosis, and anterior pelvic tilt  PELVIC ALIGNMENT: normal  LUMBARAROM/PROM:  A/PROM A/PROM  eval  Flexion 75%  Extension   Right lateral flexion   Left lateral flexion   Right rotation   Left rotation    (Blank rows = not tested)  LOWER EXTREMITY ROM:  Passive ROM Right eval Left eval  Hip flexion 75% 75%  Hip extension    Hip abduction    Hip adduction    Hip internal rotation 100 100%  Hip external rotation 75% 75%  Knee flexion    Knee extension    Ankle dorsiflexion    Ankle plantarflexion    Ankle inversion    Ankle eversion     (Blank rows = not tested)  LOWER EXTREMITY MMT:  MMT Right eval Left eval  Hip flexion    Hip extension    Hip abduction 4/5   Hip adduction    Hip internal rotation    Hip external rotation    Knee flexion    Knee  extension    Ankle dorsiflexion    Ankle plantarflexion    Ankle inversion    Ankle eversion     PALPATION:   General  lumbar and glutealstight and Rt piriformis tender                External Perineal Exam normal appearance                             Internal Pelvic Floor thickened internal anal sphincter and puborectalis, EAS low endurance, slow to relax and holding tension and some dyssynergia when attempts to push  Patient confirms identification and approves PT to assess internal pelvic floor and treatment Yes  PELVIC MMT: endurance 5 sec, 1 rep   MMT eval  Vaginal   Internal Anal Sphincter 2-3/5  External Anal Sphincter 2-3/5  Puborectalis 2-3/5  Diastasis Recti   (Blank rows = not tested)        TONE: normal  PROLAPSE:   TODAY'S TREATMENT:                                                                                                                              DATE: 08/29/22  EVAL bowel retraining techniques and toileting techniques   PATIENT EDUCATION:  Education details:  bowel retraining techniques and toileting techniques Person educated: Patient Education method: Explanation, Demonstration, Tactile cues, Verbal cues, and Handouts Education comprehension: verbalized understanding  HOME EXERCISE PROGRAM: Not issued today  ASSESSMENT:  CLINICAL IMPRESSION: Patient is a 62 y.o. female who was seen today for physical therapy evaluation and treatment for excess gas/constipation. Pt does have some pelvic floor weakness, decreased coordination, and slowness to relax the pelvic floor muscles as described above.  Pt has weakness of the right hip and tension throughout the posterior muscle chain.  Pt is expected to benefit from skilled PT to address the above issues and impairments which can effect the functioning of the GI system. Pt is recommended skilled PT for improved functional activities and bowel health.  OBJECTIVE IMPAIRMENTS: decreased coordination,  decreased endurance, decreased strength, increased muscle spasms, impaired flexibility, impaired tone, and postural dysfunction.   ACTIVITY LIMITATIONS: toileting  PARTICIPATION LIMITATIONS: community activity  PERSONAL FACTORS: Time since onset of injury/illness/exacerbation and 1-2 comorbidities: 2 vaginal deliveries, hemorrhoids  are also affecting patient's functional outcome.   REHAB POTENTIAL: Excellent  CLINICAL DECISION MAKING: Stable/uncomplicated  EVALUATION COMPLEXITY: Low   GOALS: Goals reviewed with patient? Yes  SHORT TERM GOALS: Target date: 09/26/22  Ind with initial HEP Baseline: Goal status: INITIAL  2.  Pt will be able to perform 10 quick flicks of pelvic floor for improved function and support with functional lifting or coughing. Baseline:  Goal status: INITIAL  3.  Pt will report noticeably less gas noises in a typical week. Baseline:  Goal status: INITIAL    LONG TERM GOALS: Target date: 11/21/22  Pt will be independent with advanced HEP to maintain improvements made throughout therapy  Baseline:  Goal status: INITIAL  2.  Pt will be able to do social activities without intestinal noises interrupting Baseline:  Goal status: INITIAL  3.  Pt will report only occasional intestinal distress and noises of less than 1/week  Baseline:  Goal status: INITIAL  4.  Pt will demonstrate single leg stand without trendelenburg due to 5/5 bil hip strength in order to improve stability during functional activities. Baseline:  Goal status: INITIAL    PLAN:  PT FREQUENCY: 1x/week  PT DURATION: 12 weeks  PLANNED INTERVENTIONS: Therapeutic exercises, Therapeutic activity, Neuromuscular re-education, Balance training, Gait training, Patient/Family education, Self Care, Joint mobilization, Dry Needling, Electrical stimulation, Cryotherapy, Moist heat, Taping, Biofeedback, Manual therapy, and Re-evaluation  PLAN FOR NEXT SESSION: transperineal for puborectal  angle and puborectalis/posterior pelvic floor (pt wants to see what  is happening)   Junious Silk, PT 09/30/2022, 7:45 AM

## 2022-10-01 ENCOUNTER — Other Ambulatory Visit: Payer: Self-pay | Admitting: Cardiovascular Disease

## 2022-10-02 ENCOUNTER — Ambulatory Visit: Payer: No Typology Code available for payment source | Admitting: Physical Therapy

## 2022-10-02 ENCOUNTER — Encounter: Payer: Self-pay | Admitting: Physical Therapy

## 2022-10-02 DIAGNOSIS — M62838 Other muscle spasm: Secondary | ICD-10-CM | POA: Diagnosis present

## 2022-10-02 DIAGNOSIS — M6281 Muscle weakness (generalized): Secondary | ICD-10-CM | POA: Diagnosis present

## 2022-10-02 DIAGNOSIS — R293 Abnormal posture: Secondary | ICD-10-CM | POA: Diagnosis present

## 2022-10-02 NOTE — Therapy (Addendum)
OUTPATIENT PHYSICAL THERAPY FEMALE PELVIC Treatment   Patient Name: Carolyn [name] MRN: [id number] DOB:1960-10-15, 62 y.o., female Today's Date: 10/02/2022  END OF SESSION:  PT End of Session - 10/02/22 1101     Visit Number 2    Date for PT Re-Evaluation 11/21/22    Authorization Type [employer]    PT Start Time 1020    PT Stop Time 1058    PT Time Calculation (min) 38 min    Activity Tolerance Patient tolerated treatment well    Behavior During Therapy WFL for tasks assessed/performed              Past Medical History:  Diagnosis Date   Allergy    seasonal   Anemia    Anemia of chronic disease suspected   Anxiety attack    Arthritis    Asthmatic bronchitis 04/26/2021   Benign fundic gland polyps of stomach    Dyslipidemia    Exertional shortness of breath    GERD (gastroesophageal reflux disease)    Heart murmur    Hyperlipidemia    Hypertension    Migraines    "probably weekly" (01/14/2013)   Type II diabetes mellitus (HCC)    Past Surgical History:  Procedure Laterality Date   CHOLECYSTECTOMY  1990   COLONOSCOPY     ENDOMETRIAL ABLATION  ~ 2000-2002   "twice" (01/14/2013)   LEFT HEART CATH AND CORONARY ANGIOGRAPHY N/A 06/01/2021   Procedure: LEFT HEART CATH AND CORONARY ANGIOGRAPHY;  Surgeon: Lyn Records, MD;  Location: [facility];  Service: Cardiovascular;  Laterality: N/A;   LEFT HEART CATHETERIZATION WITH CORONARY ANGIOGRAM N/A 01/15/2013   Procedure: LEFT HEART CATHETERIZATION WITH CORONARY ANGIOGRAM;  Surgeon: Micheline Chapman, MD;  Location: [facility] CATH LAB;  Service: Cardiovascular;  Laterality: N/A;   LEFT OOPHORECTOMY Left ~ 2003   ORIF TIBIA & FIBULA FRACTURES Left 2003   VAGINAL HYSTERECTOMY  ~ 2003   Patient Active Problem List   Diagnosis Date Noted   Adrenal insufficiency (HCC) 07/30/2022   Idiopathic hypotension 07/29/2022   Anemia due to zinc deficiency 01/19/2022   Dietary folate deficiency anemia 01/16/2022   Lumbar paraspinal muscle  spasm 01/10/2022   Degenerative arthritis of left knee 08/09/2021   Chronic bursitis of left shoulder 07/04/2020   Non-seasonal allergic rhinitis 05/08/2020   Generalized anxiety disorder with panic attacks 01/11/2020   Gastroparesis due to DM (HCC) 11/09/2019   Intractable migraine without aura and with status migrainosus 09/09/2019   Patellofemoral arthritis of right knee 05/05/2019   Greater trochanteric bursitis of left hip 02/22/2019   Anemia in other chronic diseases classified elsewhere 07/29/2017   Anemia due to acquired thiamine deficiency 09/18/2016   Insomnia secondary to depression with anxiety 12/12/2015   Slow transit constipation 10/19/2015   Hypertriglyceridemia 05/25/2015   Routine general medical examination at a health care facility 05/25/2015   Visit for screening mammogram 10/27/2014   Left lumbar radiculitis 10/23/2013   Obesity (BMI 30-39.9) 09/20/2013   Decreased cardiac ejection fraction 09/17/2013   Snoring 07/30/2013   Type II diabetes mellitus with manifestations (HCC) 12/02/2012   GERD (gastroesophageal reflux disease)    Hyperlipidemia with target LDL less than 100    Allergic rhinitis 07/22/2010   Essential hypertension 05/25/2010    PCP: Etta Grandchild, MD   REFERRING PROVIDER:   Iva Boop, MD    REFERRING DIAG:  [phone] (ICD-10-CM) - Chronic idiopathic constipation  M62.89 (ICD-10-CM) - Pelvic floor dysfunction  N81.6 (ICD-10-CM) -  Rectocele  K64.8 (ICD-10-CM) - Hemorrhoids with complication    THERAPY DIAG:  Muscle weakness (generalized)  Other muscle spasm  Abnormal posture  Rationale for Evaluation and Treatment: Rehabilitation  ONSET DATE: 6 months  SUBJECTIVE:                                                                                                                                                                                           SUBJECTIVE STATEMENT: Pt states things have been better with the current  meds Fluid intake: Yes: lot of water    PAIN:  Are you having pain? No  PRECAUTIONS: None  WEIGHT BEARING RESTRICTIONS: No  FALLS:  Has patient fallen in last 6 months? 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[employer]  PLOF: Independent  PATIENT GOALS: not have grumbling in stomach  PERTINENT HISTORY:  2 vaginal deliveries, hemorrhoids Sexual abuse: No  BOWEL MOVEMENT: Pain with bowel movement: No Type of bowel movement:Strain No Fully empty rectum: Yes: when taking linsess Leakage: No Pads: No Fiber supplement: No  URINATION: Pain with urination: No Fully empty bladder: Yes:   Stream: Strong Urgency: No Frequency:  Leakage:  no Pads: No  INTERCOURSE: Pain with intercourse:    PREGNANCY: Vaginal deliveries 2 Tearing Yes: 2nd   PROLAPSE: Rectocele     OBJECTIVE:   DIAGNOSTIC FINDINGS:    PATIENT SURVEYS:    PFIQ-7   COGNITION: Overall cognitive status: Within functional limits for tasks assessed     SENSATION: Light touch:  Proprioception:   MUSCLE LENGTH: Hamstrings: Right 80 deg; Left 80 deg   LUMBAR SPECIAL TESTS:  Single leg - right side trendelenburg  FUNCTIONAL TESTS:  ASLR compression felt better  GAIT:  Comments: WFL   POSTURE: increased lumbar lordosis, increased thoracic kyphosis, and anterior pelvic tilt  PELVIC ALIGNMENT: normal  LUMBARAROM/PROM:  A/PROM A/PROM  eval  Flexion 75%  Extension   Right lateral flexion   Left lateral flexion   Right rotation   Left rotation    (Blank rows = not tested)  LOWER EXTREMITY ROM:  Passive ROM Right eval Left eval  Hip flexion 75% 75%  Hip extension    Hip abduction    Hip adduction    Hip internal rotation 100 100%  Hip external rotation 75% 75%  Knee flexion    Knee extension    Ankle dorsiflexion    Ankle plantarflexion    Ankle inversion    Ankle eversion     (Blank rows = not  tested)  LOWER EXTREMITY MMT:  MMT Right eval  Left eval  Hip flexion    Hip extension    Hip abduction 4/5   Hip adduction    Hip internal rotation    Hip external rotation    Knee flexion    Knee extension    Ankle dorsiflexion    Ankle plantarflexion    Ankle inversion    Ankle eversion     PALPATION:   General  lumbar and glutealstight and Rt piriformis tender                External Perineal Exam normal appearance                             Internal Pelvic Floor thickened internal anal sphincter and puborectalis, EAS low endurance, slow to relax and holding tension and some dyssynergia when attempts to push  Patient confirms identification and approves PT to assess internal pelvic floor and treatment Yes  PELVIC MMT: endurance 5 sec, 1 rep   MMT eval  Vaginal   Internal Anal Sphincter 2-3/5  External Anal Sphincter 2-3/5  Puborectalis 2-3/5  Diastasis Recti   (Blank rows = not tested)        TONE: normal  PROLAPSE:   TODAY'S TREATMENT:                                                                                                                              DATE: 10/02/22  RUSI - tranperineal working on breathing and bulging sphincters and puborectalis Stretch lean on mat - down dog and breathing into pelvic floor  PATIENT EDUCATION:  Education details:  bowel retraining techniques and toileting techniques Person educated: Patient Education method: Explanation, Demonstration, Tactile cues, Verbal cues, and Handouts Education comprehension: verbalized understanding  HOME EXERCISE PROGRAM: Not issued today  ASSESSMENT:  CLINICAL IMPRESSION: Patient is at first follow up using biofeedback for improved lengthening.  Pt did well with this and able to feel the correct way to lengthening the pelvic floor.  Pt is recommended skilled PT for improved functional activities and bowel health.  OBJECTIVE IMPAIRMENTS: decreased coordination, decreased endurance,  decreased strength, increased muscle spasms, impaired flexibility, impaired tone, and postural dysfunction.   ACTIVITY LIMITATIONS: toileting  PARTICIPATION LIMITATIONS: community activity  PERSONAL FACTORS: Time since onset of injury/illness/exacerbation and 1-2 comorbidities: 2 vaginal deliveries, hemorrhoids  are also affecting patient's functional outcome.   REHAB POTENTIAL: Excellent  CLINICAL DECISION MAKING: Stable/uncomplicated  EVALUATION COMPLEXITY: Low   GOALS: Goals reviewed with patient? Yes  SHORT TERM GOALS: Target date: 09/26/22  Ind with initial HEP Baseline: Goal status: IN PROGRESS  2.  Pt will be able to perform 10 quick flicks of pelvic floor for improved function and support with functional lifting or coughing. Baseline:  Goal status: IN PROGRESS  3.  Pt will report noticeably less gas noises in a typical week. Baseline:  Goal status: IN PROGRESS    LONG  TERM GOALS: Target date: 11/21/22  Pt will be independent with advanced HEP to maintain improvements made throughout therapy  Baseline:  Goal status: INITIAL  2.  Pt will be able to do social activities without intestinal noises interrupting Baseline:  Goal status: INITIAL  3.  Pt will report only occasional intestinal distress and noises of less than 1/week  Baseline:  Goal status: INITIAL  4.  Pt will demonstrate single leg stand without trendelenburg due to 5/5 bil hip strength in order to improve stability during functional activities. Baseline:  Goal status: INITIAL    PLAN:  PT FREQUENCY: 1x/week  PT DURATION: 12 weeks  PLANNED INTERVENTIONS: Therapeutic exercises, Therapeutic activity, Neuromuscular re-education, Balance training, Gait training, Patient/Family education, Self Care, Joint mobilization, Dry Needling, Electrical stimulation, Cryotherapy, Moist heat, Taping, Biofeedback, Manual therapy, and Re-evaluation  PLAN FOR NEXT SESSION: internal soft tissue to puborectalis if  still having difficulty relaxing and core strength   [employer], PT 10/02/2022, 11:02 AM   PHYSICAL THERAPY DISCHARGE SUMMARY  Visits from Start of Care: 2  Current functional level related to goals / functional outcomes: See above goals   Remaining deficits: See above - one follow up only   Education / Equipment: HEP   Patient agrees to discharge. Patient goals were partially met. Patient is being discharged due to  patient was feeling better and canceled remaining visits. Called patient 11/11/22 and discussed d/c.  Pt felt like she is doing better and not needing PT any more.  Russella Dar, PT, DPT 11/11/22 4:34 PM
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
## 2022-10-08 ENCOUNTER — Ambulatory Visit: Payer: No Typology Code available for payment source | Admitting: Family Medicine

## 2022-10-11 ENCOUNTER — Other Ambulatory Visit: Payer: Self-pay | Admitting: Internal Medicine

## 2022-10-11 DIAGNOSIS — D520 Dietary folate deficiency anemia: Secondary | ICD-10-CM

## 2022-10-12 ENCOUNTER — Other Ambulatory Visit: Payer: Self-pay | Admitting: Internal Medicine

## 2022-10-14 ENCOUNTER — Ambulatory Visit: Payer: No Typology Code available for payment source | Admitting: Physical Therapy

## 2022-10-14 NOTE — Therapy (Deleted)
OUTPATIENT PHYSICAL THERAPY FEMALE PELVIC Treatment   Patient Name: Carolyn [name] MRN: [id number] DOB:Nov 29, 1960, 62 y.o., female Today's Date: 10/14/2022  END OF SESSION:     Past Medical History:  Diagnosis Date   Allergy    seasonal   Anemia    Anemia of chronic disease suspected   Anxiety attack    Arthritis    Asthmatic bronchitis 04/26/2021   Benign fundic gland polyps of stomach    Dyslipidemia    Exertional shortness of breath    GERD (gastroesophageal reflux disease)    Heart murmur    Hyperlipidemia    Hypertension    Migraines    "probably weekly" (01/14/2013)   Type II diabetes mellitus (HCC)    Past Surgical History:  Procedure Laterality Date   CHOLECYSTECTOMY  1990   COLONOSCOPY     ENDOMETRIAL ABLATION  ~ 2000-2002   "twice" (01/14/2013)   LEFT HEART CATH AND CORONARY ANGIOGRAPHY N/A 06/01/2021   Procedure: LEFT HEART CATH AND CORONARY ANGIOGRAPHY;  Surgeon: Lyn Records, MD;  Location: [facility];  Service: Cardiovascular;  Laterality: N/A;   LEFT HEART CATHETERIZATION WITH CORONARY ANGIOGRAM N/A 01/15/2013   Procedure: LEFT HEART CATHETERIZATION WITH CORONARY ANGIOGRAM;  Surgeon: Micheline Chapman, MD;  Location: [facility] CATH LAB;  Service: Cardiovascular;  Laterality: N/A;   LEFT OOPHORECTOMY Left ~ 2003   ORIF TIBIA & FIBULA FRACTURES Left 2003   VAGINAL HYSTERECTOMY  ~ 2003   Patient Active Problem List   Diagnosis Date Noted   Adrenal insufficiency (HCC) 07/30/2022   Idiopathic hypotension 07/29/2022   Anemia due to zinc deficiency 01/19/2022   Dietary folate deficiency anemia 01/16/2022   Lumbar paraspinal muscle spasm 01/10/2022   Degenerative arthritis of left knee 08/09/2021   Chronic bursitis of left shoulder 07/04/2020   Non-seasonal allergic rhinitis 05/08/2020   Generalized anxiety disorder with panic attacks 01/11/2020   Gastroparesis due to DM (HCC) 11/09/2019   Intractable migraine without aura and with status migrainosus  09/09/2019   Patellofemoral arthritis of right knee 05/05/2019   Greater trochanteric bursitis of left hip 02/22/2019   Anemia in other chronic diseases classified elsewhere 07/29/2017   Anemia due to acquired thiamine deficiency 09/18/2016   Insomnia secondary to depression with anxiety 12/12/2015   Slow transit constipation 10/19/2015   Hypertriglyceridemia 05/25/2015   Routine general medical examination at a health care facility 05/25/2015   Visit for screening mammogram 10/27/2014   Left lumbar radiculitis 10/23/2013   Obesity (BMI 30-39.9) 09/20/2013   Decreased cardiac ejection fraction 09/17/2013   Snoring 07/30/2013   Type II diabetes mellitus with manifestations (HCC) 12/02/2012   GERD (gastroesophageal reflux disease)    Hyperlipidemia with target LDL less than 100    Allergic rhinitis 07/22/2010   Essential hypertension 05/25/2010    PCP: Etta Grandchild, MD   REFERRING PROVIDER:   Iva Boop, MD    REFERRING DIAG:  K59.04 (ICD-10-CM) - Chronic idiopathic constipation  M62.89 (ICD-10-CM) - Pelvic floor dysfunction  N81.6 (ICD-10-CM) - Rectocele  K64.8 (ICD-10-CM) - Hemorrhoids with complication    THERAPY DIAG:  No diagnosis found.  Rationale for Evaluation and Treatment: Rehabilitation  ONSET DATE: 6 months  SUBJECTIVE:  SUBJECTIVE STATEMENT: Pt states things have been better with the current meds Fluid intake: Yes: lot of water    PAIN:  Are you having pain? No  PRECAUTIONS: None  WEIGHT BEARING RESTRICTIONS: No  FALLS:  Has patient fallen in last 6 months? 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[employer]  PLOF: Independent  PATIENT GOALS: not have grumbling in stomach  PERTINENT  HISTORY:  2 vaginal deliveries, hemorrhoids Sexual abuse: No  BOWEL MOVEMENT: Pain with bowel movement: No Type of bowel movement:Strain No Fully empty rectum: Yes: when taking linsess Leakage: No Pads: No Fiber supplement: No  URINATION: Pain with urination: No Fully empty bladder: Yes:   Stream: Strong Urgency: No Frequency:  Leakage:  no Pads: No  INTERCOURSE: Pain with intercourse:    PREGNANCY: Vaginal deliveries 2 Tearing Yes: 2nd   PROLAPSE: Rectocele     OBJECTIVE:   DIAGNOSTIC FINDINGS:    PATIENT SURVEYS:    PFIQ-7   COGNITION: Overall cognitive status: Within functional limits for tasks assessed     SENSATION: Light touch:  Proprioception:   MUSCLE LENGTH: Hamstrings: Right 80 deg; Left 80 deg   LUMBAR SPECIAL TESTS:  Single leg - right side trendelenburg  FUNCTIONAL TESTS:  ASLR compression felt better  GAIT:  Comments: WFL   POSTURE: increased lumbar lordosis, increased thoracic kyphosis, and anterior pelvic tilt  PELVIC ALIGNMENT: normal  LUMBARAROM/PROM:  A/PROM A/PROM  eval  Flexion 75%  Extension   Right lateral flexion   Left lateral flexion   Right rotation   Left rotation    (Blank rows = not tested)  LOWER EXTREMITY ROM:  Passive ROM Right eval Left eval  Hip flexion 75% 75%  Hip extension    Hip abduction    Hip adduction    Hip internal rotation 100 100%  Hip external rotation 75% 75%  Knee flexion    Knee extension    Ankle dorsiflexion    Ankle plantarflexion    Ankle inversion    Ankle eversion     (Blank rows = not tested)  LOWER EXTREMITY MMT:  MMT Right eval Left eval  Hip flexion    Hip extension    Hip abduction 4/5   Hip adduction    Hip internal rotation    Hip external rotation    Knee flexion    Knee extension    Ankle dorsiflexion    Ankle plantarflexion    Ankle inversion    Ankle eversion     PALPATION:   General  lumbar and glutealstight and Rt piriformis  tender                External Perineal Exam normal appearance                             Internal Pelvic Floor thickened internal anal sphincter and puborectalis, EAS low endurance, slow to relax and holding tension and some dyssynergia when attempts to push  Patient confirms identification and approves PT to assess internal pelvic floor and treatment Yes  PELVIC MMT: endurance 5 sec, 1 rep   MMT eval  Vaginal   Internal Anal Sphincter 2-3/5  External Anal Sphincter 2-3/5  Puborectalis 2-3/5  Diastasis Recti   (Blank rows = not tested)        TONE: normal  PROLAPSE:   TODAY'S TREATMENT:  DATE: 10/02/22  RUSI - tranperineal working on breathing and bulging sphincters and puborectalis Stretch lean on mat - down dog and breathing into pelvic floor  PATIENT EDUCATION:  Education details:  bowel retraining techniques and toileting techniques Person educated: Patient Education method: Explanation, Demonstration, Tactile cues, Verbal cues, and Handouts Education comprehension: verbalized understanding  HOME EXERCISE PROGRAM: Not issued today  ASSESSMENT:  CLINICAL IMPRESSION: Patient is at first follow up using biofeedback for improved lengthening.  Pt did well with this and able to feel the correct way to lengthening the pelvic floor.  Pt is recommended skilled PT for improved functional activities and bowel health.  OBJECTIVE IMPAIRMENTS: decreased coordination, decreased endurance, decreased strength, increased muscle spasms, impaired flexibility, impaired tone, and postural dysfunction.   ACTIVITY LIMITATIONS: toileting  PARTICIPATION LIMITATIONS: community activity  PERSONAL FACTORS: Time since onset of injury/illness/exacerbation and 1-2 comorbidities: 2 vaginal deliveries, hemorrhoids  are also affecting patient's functional outcome.   REHAB  POTENTIAL: Excellent  CLINICAL DECISION MAKING: Stable/uncomplicated  EVALUATION COMPLEXITY: Low   GOALS: Goals reviewed with patient? Yes  SHORT TERM GOALS: Target date: 09/26/22  Ind with initial HEP Baseline: Goal status: IN PROGRESS  2.  Pt will be able to perform 10 quick flicks of pelvic floor for improved function and support with functional lifting or coughing. Baseline:  Goal status: IN PROGRESS  3.  Pt will report noticeably less gas noises in a typical week. Baseline:  Goal status: IN PROGRESS    LONG TERM GOALS: Target date: 11/21/22  Pt will be independent with advanced HEP to maintain improvements made throughout therapy  Baseline:  Goal status: INITIAL  2.  Pt will be able to do social activities without intestinal noises interrupting Baseline:  Goal status: INITIAL  3.  Pt will report only occasional intestinal distress and noises of less than 1/week  Baseline:  Goal status: INITIAL  4.  Pt will demonstrate single leg stand without trendelenburg due to 5/5 bil hip strength in order to improve stability during functional activities. Baseline:  Goal status: INITIAL    PLAN:  PT FREQUENCY: 1x/week  PT DURATION: 12 weeks  PLANNED INTERVENTIONS: Therapeutic exercises, Therapeutic activity, Neuromuscular re-education, Balance training, Gait training, Patient/Family education, Self Care, Joint mobilization, Dry Needling, Electrical stimulation, Cryotherapy, Moist heat, Taping, Biofeedback, Manual therapy, and Re-evaluation  PLAN FOR NEXT SESSION: internal soft tissue to puborectalis if still having difficulty relaxing and core strength   Brayton Caves , PT 10/14/2022, 9:29 AM

## 2022-10-16 LAB — HM DIABETES EYE EXAM

## 2022-10-25 ENCOUNTER — Other Ambulatory Visit: Payer: Self-pay | Admitting: Internal Medicine

## 2022-10-25 DIAGNOSIS — K2100 Gastro-esophageal reflux disease with esophagitis, without bleeding: Secondary | ICD-10-CM

## 2022-10-28 ENCOUNTER — Ambulatory Visit: Payer: No Typology Code available for payment source | Admitting: Physical Therapy

## 2022-11-04 ENCOUNTER — Telehealth: Payer: Self-pay

## 2022-11-04 NOTE — Telephone Encounter (Signed)
Pharmacy Patient Advocate Encounter  Received notification from [facility] that Prior Authorization for [facility] has been APPROVED from 11/04/22 to 11/04/23.Marland Kitchen  PA #/Case ID/Reference #: [id number]

## 2022-11-04 NOTE — Telephone Encounter (Signed)
Pharmacy Patient Advocate Encounter   Received notification from CoverMyMeds that prior authorization for [facility] is required/requested.   Insurance verification completed.   The patient is insured through [facility] .   Per test claim: PA submitted to [facility] via CoverMyMeds Key/confirmation #/EOC [id number] Status is pending

## 2022-11-11 ENCOUNTER — Ambulatory Visit: Payer: No Typology Code available for payment source | Attending: Internal Medicine | Admitting: Physical Therapy

## 2022-11-11 DIAGNOSIS — M6281 Muscle weakness (generalized): Secondary | ICD-10-CM | POA: Insufficient documentation

## 2022-11-11 DIAGNOSIS — M62838 Other muscle spasm: Secondary | ICD-10-CM | POA: Insufficient documentation

## 2022-11-11 DIAGNOSIS — R293 Abnormal posture: Secondary | ICD-10-CM | POA: Insufficient documentation

## 2022-11-11 NOTE — Therapy (Deleted)
OUTPATIENT PHYSICAL THERAPY FEMALE PELVIC Treatment   Patient Name: Carolyn [name] MRN: [id number] DOB:[birth year], 61 y.o., female Today's Date: 11/11/2022  END OF SESSION:     Past Medical History:  Diagnosis Date   Allergy    seasonal   Anemia    Anemia of chronic disease suspected   Anxiety attack    Arthritis    Asthmatic bronchitis 04/26/2021   Benign fundic gland polyps of stomach    Dyslipidemia    Exertional shortness of breath    GERD (gastroesophageal reflux disease)    Heart murmur    Hyperlipidemia    Hypertension    Migraines    "probably weekly" (01/14/2013)   Type II diabetes mellitus (HCC)    Past Surgical History:  Procedure Laterality Date   CHOLECYSTECTOMY  1990   COLONOSCOPY     ENDOMETRIAL ABLATION  ~ 2000-2002   "twice" (01/14/2013)   LEFT HEART CATH AND CORONARY ANGIOGRAPHY N/A 06/01/2021   Procedure: LEFT HEART CATH AND CORONARY ANGIOGRAPHY;  Surgeon: Lyn Records, MD;  Location: [facility];  Service: Cardiovascular;  Laterality: N/A;   LEFT HEART CATHETERIZATION WITH CORONARY ANGIOGRAM N/A 01/15/2013   Procedure: LEFT HEART CATHETERIZATION WITH CORONARY ANGIOGRAM;  Surgeon: Micheline Chapman, MD;  Location: [facility] CATH LAB;  Service: Cardiovascular;  Laterality: N/A;   LEFT OOPHORECTOMY Left ~ 2003   ORIF TIBIA & FIBULA FRACTURES Left 2003   VAGINAL HYSTERECTOMY  ~ 2003   Patient Active Problem List   Diagnosis Date Noted   Adrenal insufficiency (HCC) 07/30/2022   Idiopathic hypotension 07/29/2022   Anemia due to zinc deficiency 01/19/2022   Dietary folate deficiency anemia 01/16/2022   Lumbar paraspinal muscle spasm 01/10/2022   Degenerative arthritis of left knee 08/09/2021   Chronic bursitis of left shoulder 07/04/2020   Non-seasonal allergic rhinitis 05/08/2020   Generalized anxiety disorder with panic attacks 01/11/2020   Gastroparesis due to DM (HCC) 11/09/2019   Intractable migraine without aura and with status migrainosus  09/09/2019   Patellofemoral arthritis of right knee 05/05/2019   Greater trochanteric bursitis of left hip 02/22/2019   Anemia in other chronic diseases classified elsewhere 07/29/2017   Anemia due to acquired thiamine deficiency 09/18/2016   Insomnia secondary to depression with anxiety 12/12/2015   Slow transit constipation 10/19/2015   Hypertriglyceridemia 05/25/2015   Routine general medical examination at a health care facility 05/25/2015   Visit for screening mammogram 10/27/2014   Left lumbar radiculitis 10/23/2013   Obesity (BMI 30-39.9) 09/20/2013   Decreased cardiac ejection fraction 09/17/2013   Snoring 07/30/2013   Type II diabetes mellitus with manifestations (HCC) 12/02/2012   GERD (gastroesophageal reflux disease)    Hyperlipidemia with target LDL less than 100    Allergic rhinitis 07/22/2010   Essential hypertension 05/25/2010    PCP: Etta Grandchild, MD   REFERRING PROVIDER:   Iva Boop, MD    REFERRING DIAG:  K59.04 (ICD-10-CM) - Chronic idiopathic constipation  M62.89 (ICD-10-CM) - Pelvic floor dysfunction  N81.6 (ICD-10-CM) - Rectocele  K64.8 (ICD-10-CM) - Hemorrhoids with complication    THERAPY DIAG:  No diagnosis found.  Rationale for Evaluation and Treatment: Rehabilitation  ONSET DATE: 6 months  SUBJECTIVE:  SUBJECTIVE STATEMENT: Pt states things have been better with the current meds Fluid intake: Yes: lot of water    PAIN:  Are you having pain? No  PRECAUTIONS: None  WEIGHT BEARING RESTRICTIONS: No  FALLS:  Has patient fallen in last 6 months? 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[employer]  PLOF: Independent  PATIENT GOALS: not have grumbling in stomach  PERTINENT  HISTORY:  2 vaginal deliveries, hemorrhoids Sexual abuse: No  BOWEL MOVEMENT: Pain with bowel movement: No Type of bowel movement:Strain No Fully empty rectum: Yes: when taking linsess Leakage: No Pads: No Fiber supplement: No  URINATION: Pain with urination: No Fully empty bladder: Yes:   Stream: Strong Urgency: No Frequency:  Leakage:  no Pads: No  INTERCOURSE: Pain with intercourse:    PREGNANCY: Vaginal deliveries 2 Tearing Yes: 2nd   PROLAPSE: Rectocele     OBJECTIVE:   DIAGNOSTIC FINDINGS:    PATIENT SURVEYS:    PFIQ-7   COGNITION: Overall cognitive status: Within functional limits for tasks assessed     SENSATION: Light touch:  Proprioception:   MUSCLE LENGTH: Hamstrings: Right 80 deg; Left 80 deg   LUMBAR SPECIAL TESTS:  Single leg - right side trendelenburg  FUNCTIONAL TESTS:  ASLR compression felt better  GAIT:  Comments: WFL   POSTURE: increased lumbar lordosis, increased thoracic kyphosis, and anterior pelvic tilt  PELVIC ALIGNMENT: normal  LUMBARAROM/PROM:  A/PROM A/PROM  eval  Flexion 75%  Extension   Right lateral flexion   Left lateral flexion   Right rotation   Left rotation    (Blank rows = not tested)  LOWER EXTREMITY ROM:  Passive ROM Right eval Left eval  Hip flexion 75% 75%  Hip extension    Hip abduction    Hip adduction    Hip internal rotation 100 100%  Hip external rotation 75% 75%  Knee flexion    Knee extension    Ankle dorsiflexion    Ankle plantarflexion    Ankle inversion    Ankle eversion     (Blank rows = not tested)  LOWER EXTREMITY MMT:  MMT Right eval Left eval  Hip flexion    Hip extension    Hip abduction 4/5   Hip adduction    Hip internal rotation    Hip external rotation    Knee flexion    Knee extension    Ankle dorsiflexion    Ankle plantarflexion    Ankle inversion    Ankle eversion     PALPATION:   General  lumbar and glutealstight and Rt piriformis  tender                External Perineal Exam normal appearance                             Internal Pelvic Floor thickened internal anal sphincter and puborectalis, EAS low endurance, slow to relax and holding tension and some dyssynergia when attempts to push  Patient confirms identification and approves PT to assess internal pelvic floor and treatment Yes  PELVIC MMT: endurance 5 sec, 1 rep   MMT eval  Vaginal   Internal Anal Sphincter 2-3/5  External Anal Sphincter 2-3/5  Puborectalis 2-3/5  Diastasis Recti   (Blank rows = not tested)        TONE: normal  PROLAPSE:   TODAY'S TREATMENT:  DATE: 10/02/22  RUSI - tranperineal working on breathing and bulging sphincters and puborectalis Stretch lean on mat - down dog and breathing into pelvic floor  PATIENT EDUCATION:  Education details:  bowel retraining techniques and toileting techniques Person educated: Patient Education method: Explanation, Demonstration, Tactile cues, Verbal cues, and Handouts Education comprehension: verbalized understanding  HOME EXERCISE PROGRAM: Not issued today  ASSESSMENT:  CLINICAL IMPRESSION: Patient is at first follow up using biofeedback for improved lengthening.  Pt did well with this and able to feel the correct way to lengthening the pelvic floor.  Pt is recommended skilled PT for improved functional activities and bowel health.  OBJECTIVE IMPAIRMENTS: decreased coordination, decreased endurance, decreased strength, increased muscle spasms, impaired flexibility, impaired tone, and postural dysfunction.   ACTIVITY LIMITATIONS: toileting  PARTICIPATION LIMITATIONS: community activity  PERSONAL FACTORS: Time since onset of injury/illness/exacerbation and 1-2 comorbidities: 2 vaginal deliveries, hemorrhoids  are also affecting patient's functional outcome.   REHAB  POTENTIAL: Excellent  CLINICAL DECISION MAKING: Stable/uncomplicated  EVALUATION COMPLEXITY: Low   GOALS: Goals reviewed with patient? Yes  SHORT TERM GOALS: Target date: 09/26/22  Ind with initial HEP Baseline: Goal status: IN PROGRESS  2.  Pt will be able to perform 10 quick flicks of pelvic floor for improved function and support with functional lifting or coughing. Baseline:  Goal status: IN PROGRESS  3.  Pt will report noticeably less gas noises in a typical week. Baseline:  Goal status: IN PROGRESS    LONG TERM GOALS: Target date: 11/21/22  Pt will be independent with advanced HEP to maintain improvements made throughout therapy  Baseline:  Goal status: INITIAL  2.  Pt will be able to do social activities without intestinal noises interrupting Baseline:  Goal status: INITIAL  3.  Pt will report only occasional intestinal distress and noises of less than 1/week  Baseline:  Goal status: INITIAL  4.  Pt will demonstrate single leg stand without trendelenburg due to 5/5 bil hip strength in order to improve stability during functional activities. Baseline:  Goal status: INITIAL    PLAN:  PT FREQUENCY: 1x/week  PT DURATION: 12 weeks  PLANNED INTERVENTIONS: Therapeutic exercises, Therapeutic activity, Neuromuscular re-education, Balance training, Gait training, Patient/Family education, Self Care, Joint mobilization, Dry Needling, Electrical stimulation, Cryotherapy, Moist heat, Taping, Biofeedback, Manual therapy, and Re-evaluation  PLAN FOR NEXT SESSION: internal soft tissue to puborectalis if still having difficulty relaxing and core strength   Brayton Caves , PT 11/11/2022, 11:16 AM
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
## 2022-11-16 ENCOUNTER — Encounter (HOSPITAL_BASED_OUTPATIENT_CLINIC_OR_DEPARTMENT_OTHER): Payer: Self-pay | Admitting: Emergency Medicine

## 2022-11-16 ENCOUNTER — Emergency Department (HOSPITAL_BASED_OUTPATIENT_CLINIC_OR_DEPARTMENT_OTHER): Payer: No Typology Code available for payment source

## 2022-11-16 ENCOUNTER — Other Ambulatory Visit: Payer: Self-pay

## 2022-11-16 ENCOUNTER — Emergency Department (HOSPITAL_BASED_OUTPATIENT_CLINIC_OR_DEPARTMENT_OTHER)
Admission: EM | Admit: 2022-11-16 | Discharge: 2022-11-16 | Disposition: A | Discharge status: Home / Self Care | Payer: No Typology Code available for payment source | Attending: Emergency Medicine | Admitting: Emergency Medicine

## 2022-11-16 DIAGNOSIS — J069 Acute upper respiratory infection, unspecified: Secondary | ICD-10-CM | POA: Insufficient documentation

## 2022-11-16 DIAGNOSIS — Z20822 Contact with and (suspected) exposure to covid-19: Secondary | ICD-10-CM | POA: Diagnosis not present

## 2022-11-16 DIAGNOSIS — R059 Cough, unspecified: Secondary | ICD-10-CM | POA: Diagnosis present

## 2022-11-16 HISTORY — DX: Cardiomyopathy, unspecified: I42.9

## 2022-11-16 LAB — GROUP A STREP BY PCR: Group A Strep by PCR: NOT DETECTED

## 2022-11-16 LAB — SARS CORONAVIRUS 2 BY RT PCR: SARS Coronavirus 2 by RT PCR: NEGATIVE

## 2022-11-16 MED ORDER — GUAIFENESIN 100 MG/5ML PO LIQD
5.0000 mL | Freq: Once | ORAL | Status: AC
  Administered 2022-11-16: 5 mL via ORAL
  Filled 2022-11-16: qty 10

## 2022-11-16 MED ORDER — DEXAMETHASONE SODIUM PHOSPHATE 10 MG/ML IJ SOLN
10.0000 mg | Freq: Once | INTRAMUSCULAR | Status: AC
  Administered 2022-11-16: 10 mg via INTRAMUSCULAR
  Filled 2022-11-16: qty 1

## 2022-11-16 MED ORDER — KETOROLAC TROMETHAMINE 60 MG/2ML IM SOLN
30.0000 mg | Freq: Once | INTRAMUSCULAR | Status: AC
  Administered 2022-11-16: 30 mg via INTRAMUSCULAR
  Filled 2022-11-16: qty 2

## 2022-11-16 NOTE — ED Triage Notes (Signed)
Cough/congestion /sore throat for 2 days, no fever. No n/v/d

## 2022-11-16 NOTE — ED Notes (Signed)
Dc instructions reviewed with patient. Patient voiced understanding. Dc with belongings.  °

## 2022-11-16 NOTE — ED Provider Notes (Signed)
[place] EMERGENCY DEPARTMENT AT [facility] Provider Note   CSN: [id number] Arrival date & time: 11/16/22  5621     History  No chief complaint on file.   Carolyn [name] is a 62 y.o. female presented to the Emergency Department with complaint of cough, headache, sore throat, nasal congestion, onset 2 days ago.  The patient denies sick contacts that she lives alone but reports she was at a [employer] study camp all of last week and wonders whether there were sick people at the camp.  She reports she has had a persistent dry cough now.    HPI     Home Medications Prior to Admission medications   Medication Sig Start Date End Date Taking? Authorizing Provider  acetaminophen (TYLENOL) 500 MG tablet Take 1,000 mg by mouth every 6 (six) hours as needed for mild pain or headache.    [provider]  aspirin EC 81 MG tablet Take 81 mg by mouth daily. Swallow whole.    [provider]  Blood Glucose Monitoring Suppl (ONETOUCH VERIO IQ SYSTEM) W/DEVICE KIT 1 Act by Does not apply route 3 (three) times daily. 04/06/14   Etta Grandchild, MD  budesonide-formoterol (SYMBICORT) 80-4.5 MCG/ACT inhaler Inhale 2 puffs into the lungs 2 (two) times daily. 04/26/21   Etta Grandchild, MD  calcium carbonate (OS-CAL - DOSED IN MG OF ELEMENTAL CALCIUM) 1250 (500 Ca) MG tablet Take 2 tablets by mouth daily with breakfast.    [provider]  carvedilol (COREG) 25 MG tablet Take 1 tablet by mouth twice daily 12/10/21   Wendall Stade, MD  cholecalciferol (VITAMIN D3) 25 MCG (1000 UNIT) tablet Take 2,000 Units by mouth daily.    [provider]  clonazePAM Scarlette Calico) 1 MG tablet Take 1 tablet by mouth twice daily as needed for anxiety 08/06/22   Etta Grandchild, MD  EPINEPHrine 0.3 mg/0.3 mL IJ SOAJ injection Inject 0.3 mg into the muscle as needed for anaphylaxis. Patient not taking: Reported on 09/19/2022 06/01/21   Jonita Albee, PA-C  fenofibrate (TRICOR) 145 MG  tablet Take 1 tablet (145 mg total) by mouth daily. 02/06/22   Wendall Stade, MD  fluticasone (FLONASE) 50 MCG/ACT nasal spray Place 1 spray into both nostrils daily as needed for allergies or rhinitis.    [provider]  folic acid (FOLVITE) 1 MG tablet Take 1 tablet by mouth once daily 10/11/22   Etta Grandchild, MD  glucose blood (ONE TOUCH ULTRA TEST) test strip Use to test blood sugar three times a week and prn if having symptoms of low blood sugar Dx: 250.92 90 day supply 05/28/13   Norins, Rosalyn Gess, MD  hydrocortisone (ANUSOL-HC) 2.5 % rectal cream Place 1 Application rectally 2 (two) times daily as needed for hemorrhoids or anal itching. 07/15/22   Iva Boop, MD  hydrocortisone (CORTEF) 10 MG tablet Take 1 tablet (10 mg total) by mouth 2 (two) times daily. 07/30/22   Etta Grandchild, MD  Insulin Pen Needle (BD PEN NEEDLE MICRO U/F) 32G X 6 MM MISC Inject 1 Units into the skin once a week. 01/01/21   Wendall Stade, MD  iron polysaccharides (NIFEREX) 150 MG capsule Take 150 mg by mouth daily.    [provider]  isosorbide dinitrate (ISORDIL) 20 MG tablet TAKE 1 TABLET BY MOUTH THREE TIMES DAILY 07/11/22   Wendall Stade, MD  levocetirizine (XYZAL) 5 MG tablet Take 5 mg by mouth every  evening.    [provider]  lidocaine (LIDODERM) 5 % Place 1 patch onto the skin daily. Remove & Discard patch within 12 hours or as directed by MD 01/09/22   Henderly, Britni A, PA-C  linaclotide (LINZESS) 290 MCG CAPS capsule Take 1 capsule (290 mcg total) by mouth daily before breakfast. 09/19/22   Iva Boop, MD  LINZESS 145 MCG CAPS capsule TAKE 1 CAPSULE BY MOUTH ONCE DAILY BEFORE BREAKFAST 10/12/22   Etta Grandchild, MD  Multiple Vitamins-Minerals (HAIR SKIN AND NAILS FORMULA) TABS Take 1 tablet by mouth daily.    [provider]  omega-3 acid ethyl esters (LOVAZA) 1 g capsule Take 2 capsules by mouth twice daily 08/05/22   Wendall Stade, MD  omeprazole (PRILOSEC)  40 MG capsule TAKE 1 CAPSULE BY MOUTH TWICE DAILY. TAKE  AT  8 AM AND 10 PM 10/25/22   Etta Grandchild, MD  Prucalopride Succinate (MOTEGRITY) 2 MG TABS Take 1 tablet (2 mg total) by mouth daily. 09/19/22   Iva Boop, MD  rosuvastatin (CRESTOR) 20 MG tablet Take 1 tablet (20 mg total) by mouth daily. 06/18/22   Wendall Stade, MD  sacubitril-valsartan (ENTRESTO) 825 443 2598 MG Take 1 tablet by mouth twice daily 10/01/22   Wendall Stade, MD  Semaglutide, 2 MG/DOSE, (OZEMPIC, 2 MG/DOSE,) 8 MG/3ML SOPN INJECT 2 MG  SUBCUTANEOUSLY ONCE A WEEK 03/27/22   Wendall Stade, MD  spironolactone (ALDACTONE) 25 MG tablet Take 1 tablet by mouth once daily 08/05/22   Wendall Stade, MD  tiZANidine (ZANAFLEX) 2 MG tablet Take 1 tablet (2 mg total) by mouth at bedtime as needed for muscle spasms. 01/10/22   Judi Saa, DO  zinc gluconate 50 MG tablet Take 1 tablet (50 mg total) by mouth daily. 01/19/22   Etta Grandchild, MD      Allergies    Invokana [canagliflozin], Codeine, Doxycycline, Flexeril [cyclobenzaprine], Morphine and codeine, Reglan [metoclopramide], and Silicon    Review of Systems   Review of Systems  Physical Exam Updated Vital Signs BP (!) 140/98 (BP Location: Right Arm)   Pulse 97   Temp 99 F (37.2 C)   Resp (!) 24   Wt 70.3 kg   SpO2 98%   BMI 26.61 kg/m  Physical Exam Constitutional:      General: She is not in acute distress. HENT:     Head: Normocephalic and atraumatic.  Eyes:     Conjunctiva/sclera: Conjunctivae normal.     Pupils: Pupils are equal, round, and reactive to light.  Cardiovascular:     Rate and Rhythm: Normal rate and regular rhythm.  Pulmonary:     Effort: Pulmonary effort is normal. No respiratory distress.     Breath sounds: Normal breath sounds. No wheezing.     Comments: Dry coughing Abdominal:     General: There is no distension.     Tenderness: There is no abdominal tenderness.  Skin:    General: Skin is warm and dry.  Neurological:      General: No focal deficit present.     Mental Status: She is alert. Mental status is at baseline.  Psychiatric:        Mood and Affect: Mood normal.        Behavior: Behavior normal.     ED Results / Procedures / Treatments   Labs (all labs ordered are listed, but only abnormal results are displayed) Labs Reviewed  SARS CORONAVIRUS 2 BY RT  PCR  GROUP A STREP BY PCR    EKG None  Radiology DG Chest Portable 1 View  Result Date: 11/16/2022 CLINICAL DATA:  Cough, shortness of breath EXAM: PORTABLE CHEST 1 VIEW COMPARISON:  08/30/2022 FINDINGS: Cardiomegaly. Both lungs are clear. The visualized skeletal structures are unremarkable. IMPRESSION: Cardiomegaly without acute abnormality of the lungs in AP portable projection. Electronically Signed   By: Jearld Lesch M.D.   On: 11/16/2022 09:19    Procedures Procedures    Medications Ordered in ED Medications  dexamethasone (DECADRON) injection 10 mg (10 mg Intramuscular Given 11/16/22 0832)  ketorolac (TORADOL) injection 30 mg (30 mg Intramuscular Given 11/16/22 0832)  guaiFENesin (ROBITUSSIN) 100 MG/5ML liquid 5 mL (5 mLs Oral Given 11/16/22 0831)    ED Course/ Medical Decision Making/ A&P                                 Medical Decision Making Amount and/or Complexity of Data Reviewed Radiology: ordered.  Risk OTC drugs. Prescription drug management.   Patient is here with constellation of symptoms that strongly suggest a viral URI.  Now on day 2 of symptoms.  We will test for COVID.  Strep test was ordered from triage, I do not see clear evidence of strep pharyngitis and have a low suspicion with her Centor score.    Patient was given IM Decadron and Toradol for symptomatic relief, of headache and sore throat.  She was also given Robitussin for cough.  I advised to continue over-the-counter medications for suspected viral syndrome which can typically last 5 to 7 days.  X-ray and labs reviewed.  No emergent findings.  COVID is  negative.  We discussed her borderline cardiomegaly which she is aware of, and she sees a cardiologist who monitors her heart failure.  But at this time I do not see evidence of heart failure exacerbation.  She is not hypoxic and not in respiratory distress.  There is no indication for antibiotics at this time for suspected viral syndrome.  There is no indication for hospitalization.  I do believe she would be stable for home management.        Final Clinical Impression(s) / ED Diagnoses Final diagnoses:  Viral URI with cough    Rx / DC Orders ED Discharge Orders     None         , [name], MD 11/16/22 [phone]

## 2022-11-18 ENCOUNTER — Telehealth: Payer: No Typology Code available for payment source | Admitting: Family Medicine

## 2022-11-18 ENCOUNTER — Encounter: Payer: Self-pay | Admitting: Family Medicine

## 2022-11-18 VITALS — Ht 64.0 in

## 2022-11-18 DIAGNOSIS — I10 Essential (primary) hypertension: Secondary | ICD-10-CM | POA: Diagnosis not present

## 2022-11-18 DIAGNOSIS — R21 Rash and other nonspecific skin eruption: Secondary | ICD-10-CM

## 2022-11-18 DIAGNOSIS — R062 Wheezing: Secondary | ICD-10-CM | POA: Diagnosis not present

## 2022-11-18 DIAGNOSIS — J069 Acute upper respiratory infection, unspecified: Secondary | ICD-10-CM

## 2022-11-18 MED ORDER — BUDESONIDE-FORMOTEROL FUMARATE 80-4.5 MCG/ACT IN AERO: 2.0000 | INHALATION_SPRAY | Freq: Two times a day (BID) | RESPIRATORY_TRACT | 0 refills | Status: DC

## 2022-11-18 MED ORDER — DOXYCYCLINE HYCLATE 100 MG PO TABS
100.0000 mg | ORAL_TABLET | Freq: Two times a day (BID) | ORAL | 0 refills | Status: AC
Start: 2022-11-18 — End: 2022-11-25
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
## 2022-11-25 ENCOUNTER — Other Ambulatory Visit: Payer: Self-pay | Admitting: Cardiovascular Disease

## 2022-11-26 ENCOUNTER — Ambulatory Visit: Payer: No Typology Code available for payment source | Admitting: Family Medicine

## 2022-11-26 ENCOUNTER — Other Ambulatory Visit: Payer: Self-pay | Admitting: Internal Medicine

## 2022-11-26 DIAGNOSIS — K2100 Gastro-esophageal reflux disease with esophagitis, without bleeding: Secondary | ICD-10-CM

## 2022-12-06 ENCOUNTER — Ambulatory Visit (INDEPENDENT_AMBULATORY_CARE_PROVIDER_SITE_OTHER): Payer: No Typology Code available for payment source

## 2022-12-06 ENCOUNTER — Ambulatory Visit (INDEPENDENT_AMBULATORY_CARE_PROVIDER_SITE_OTHER): Payer: No Typology Code available for payment source | Admitting: Nurse Practitioner

## 2022-12-06 VITALS — BP 116/72 | HR 101 | Temp 98.6°F | Ht 64.0 in | Wt 159.6 lb

## 2022-12-06 DIAGNOSIS — E2740 Unspecified adrenocortical insufficiency: Secondary | ICD-10-CM | POA: Diagnosis not present

## 2022-12-06 DIAGNOSIS — E118 Type 2 diabetes mellitus with unspecified complications: Secondary | ICD-10-CM

## 2022-12-06 DIAGNOSIS — R059 Cough, unspecified: Secondary | ICD-10-CM | POA: Insufficient documentation

## 2022-12-06 DIAGNOSIS — R053 Chronic cough: Secondary | ICD-10-CM | POA: Insufficient documentation

## 2022-12-06 LAB — COMPREHENSIVE METABOLIC PANEL
ALT: 12 U/L (ref 0–35)
AST: 15 U/L (ref 0–37)
Albumin: 4.2 g/dL (ref 3.5–5.2)
Alkaline Phosphatase: 28 U/L — ABNORMAL LOW (ref 39–117)
BUN: 15 mg/dL (ref 6–23)
CO2: 29 mEq/L (ref 19–32)
Calcium: 8.7 mg/dL (ref 8.4–10.5)
Chloride: 105 mEq/L (ref 96–112)
Creatinine, Ser: 0.95 mg/dL (ref 0.40–1.20)
GFR: 64.99 mL/min (ref >60.00)
Glucose, Bld: 98 mg/dL (ref 70–99)
Potassium: 3.7 mEq/L (ref 3.5–5.1)
Sodium: 141 mEq/L (ref 135–145)
Total Bilirubin: 0.4 mg/dL (ref 0.2–1.2)
Total Protein: 6.8 g/dL (ref 6.0–8.3)

## 2022-12-06 LAB — CBC
HCT: 32.8 % — ABNORMAL LOW (ref 36.0–46.0)
Hemoglobin: 10.7 g/dL — ABNORMAL LOW (ref 12.0–15.0)
MCHC: 32.7 g/dL (ref 30.0–36.0)
MCV: 93.7 fl (ref 78.0–100.0)
Platelets: 269 10*3/uL (ref 150.0–400.0)
RBC: 3.5 Mil/uL — ABNORMAL LOW (ref 3.87–5.11)
RDW: 12.8 % (ref 11.5–15.5)
WBC: 6.7 10*3/uL (ref 4.0–10.5)

## 2022-12-06 LAB — BRAIN NATRIURETIC PEPTIDE: Pro B Natriuretic peptide (BNP): 60 pg/mL (ref 0.0–100.0)

## 2022-12-06 LAB — HEMOGLOBIN A1C: Hgb A1c MFr Bld: 5.6 % (ref 4.6–6.5)

## 2022-12-06 MED ORDER — AZITHROMYCIN 250 MG PO TABS
ORAL_TABLET | ORAL | 0 refills | Status: AC
Start: 2022-12-06 — End: 2022-12-11

## 2022-12-06 MED ORDER — BENZONATATE 100 MG PO CAPS: 100.0000 mg | ORAL_CAPSULE | Freq: Two times a day (BID) | ORAL | 0 refills | Status: DC | PRN

## 2022-12-06 NOTE — Progress Notes (Addendum)
Established Patient Office Visit  Subjective   Patient ID: Carolyn [name], female    DOB: 1960-11-20  Age: 62 y.o. MRN: [id number]  Chief Complaint  Patient presents with  . Cough    Pt has dry cough for weeks. Worse at night and when she talks. Patient states a few weeks ago she did have some weakness     Cough: Started early this month. Has persisted with mild improvements. Having a hard time sleeping. No reoccurrence in fever, remains dry. Has been tested for covid which was negative. Was prescribed doxycycline but never took it. Has cardiomyopathy but denies worsening swelling in legs, orthopnea, paroxysmal nocturnal dyspnea, has had more fatigue and weight gain which she feels may be related to steroid use as treatment for Adrenal insufficiency. Takes her steroid only as needed now. Patient also has T2DM and is requesting her A1C been checked.      Review of Systems  Constitutional:  Positive for malaise/fatigue. Negative for fever (had a fever for 1 day earlier in course).  Respiratory:  Positive for cough. Negative for sputum production, shortness of breath and wheezing (had wheezing originally, but this has stopped).   Cardiovascular:  Negative for chest pain, palpitations, orthopnea, leg swelling and PND.      Objective:     BP 116/72 (BP Location: Left Arm, Patient Position: Sitting, Cuff Size: Normal)   Pulse (!) 101   Temp 98.6 F (37 C) (Oral)   Ht 5\' 4"  (1.626 m)   Wt 159 lb 9.6 oz (72.4 kg)   SpO2 95%   BMI 27.40 kg/m  BP Readings from Last 3 Encounters:  12/06/22 116/72  11/16/22 (!) 148/94  09/19/22 110/72   Wt Readings from Last 3 Encounters:  12/06/22 159 lb 9.6 oz (72.4 kg)  11/16/22 155 lb (70.3 kg)  09/19/22 156 lb 8 oz (71 kg)      Physical Exam Vitals reviewed.  Constitutional:      General: She is not in acute distress.    Appearance: Normal appearance.  HENT:     Head: Normocephalic and atraumatic.  Neck:     Vascular: No carotid  bruit.  Cardiovascular:     Rate and Rhythm: Normal rate and regular rhythm.     Pulses: Normal pulses.     Heart sounds: Normal heart sounds.  Pulmonary:     Effort: Pulmonary effort is normal.     Breath sounds: Normal breath sounds.  Skin:    General: Skin is warm and dry.  Neurological:     General: No focal deficit present.     Mental Status: She is alert and oriented to person, place, and time.  Psychiatric:        Mood and Affect: Mood normal.        Behavior: Behavior normal.        Judgment: Judgment normal.     Results for orders placed or performed in visit on 12/06/22  Hemoglobin A1c  Result Value Ref Range   Hgb A1c MFr Bld 5.6 4.6 - 6.5 %  Comprehensive metabolic panel  Result Value Ref Range   Sodium 141 135 - 145 mEq/L   Potassium 3.7 3.5 - 5.1 mEq/L   Chloride 105 96 - 112 mEq/L   CO2 29 19 - 32 mEq/L   Glucose, Bld 98 70 - 99 mg/dL   BUN 15 6 - 23 mg/dL   Creatinine, Ser 2.53 0.40 - 1.20 mg/dL   Total Bilirubin 0.4  0.2 - 1.2 mg/dL   Alkaline Phosphatase 28 (L) 39 - 117 U/L   AST 15 0 - 37 U/L   ALT 12 0 - 35 U/L   Total Protein 6.8 6.0 - 8.3 g/dL   Albumin 4.2 3.5 - 5.2 g/dL   GFR 16.10 >96.04 mL/min   Calcium 8.7 8.4 - 10.5 mg/dL  CBC  Result Value Ref Range   WBC 6.7 4.0 - 10.5 K/uL   RBC 3.50 (L) 3.87 - 5.11 Mil/uL   Platelets 269.0 150.0 - 400.0 K/uL   Hemoglobin 10.7 (L) 12.0 - 15.0 g/dL   HCT 54.0 (L) 98.1 - 19.1 %   MCV 93.7 78.0 - 100.0 fl   MCHC 32.7 30.0 - 36.0 g/dL   RDW 47.8 29.5 - 62.1 %  Brain natriuretic peptide  Result Value Ref Range   Pro B Natriuretic peptide (BNP) 60.0 0.0 - 100.0 pg/mL      The ASCVD Risk score (Arnett DK, et al., 2019) failed to calculate for the following reasons:   The patient has a prior MI or stroke diagnosis    Assessment & Plan:   Problem List Items Addressed This Visit       Endocrine   Type II diabetes mellitus with manifestations (HCC)    Chronic Check A1c, further recommendations  may be made based upon these results      Relevant Orders   Hemoglobin A1c (Completed)   Adrenal insufficiency (HCC)    Chronic Check cortisol level early in the morning next week, further recommendations may be made based on his results      Relevant Orders   Cortisol     Other   Cough - Primary    Acute/subacute No evidence of pneumonia on exam, concern for either bronchitis or pertussis. Will treat with course of azithromycin as well as Tessalon Perles as needed for cough suppression. No leg swelling on exam, pulse ox within normal limits.  Check BNP as well, further recommendations may be made based upon these results      Relevant Medications   benzonatate (TESSALON) 100 MG capsule   Other Relevant Orders   DG Chest 2 View (Completed)   Brain natriuretic peptide (Completed)   CBC (Completed)   Comprehensive metabolic panel (Completed)    Return if symptoms worsen or fail to improve, for If cough does not improve in 4 weeks come back to office.    Elenore Paddy, NP

## 2022-12-06 NOTE — Assessment & Plan Note (Addendum)
Acute/subacute No evidence of pneumonia on exam, concern for either bronchitis or pertussis. Will treat with course of azithromycin as well as Tessalon Perles as needed for cough suppression. No leg swelling on exam, pulse ox within normal limits.  Check BNP as well, further recommendations may be made based upon these results

## 2022-12-06 NOTE — Assessment & Plan Note (Signed)
Chronic Check cortisol level early in the morning next week, further recommendations may be made based on his results

## 2022-12-06 NOTE — Assessment & Plan Note (Signed)
Chronic Check A1c, further recommendations may be made based upon these results

## 2022-12-09 ENCOUNTER — Telehealth: Payer: Self-pay | Admitting: Cardiovascular Disease

## 2022-12-09 NOTE — Telephone Encounter (Signed)
Patient is requesting to speak with RN Pam. Patient states she has had a persistent cough for a month now. Patient states no one will prescriber her an inhaler due to her heart condition. Patient wants to know if there is something we can do to help. Please advise.

## 2022-12-10 MED ORDER — ALBUTEROL SULFATE HFA 108 (90 BASE) MCG/ACT IN AERS: 2.0000 | INHALATION_SPRAY | Freq: Four times a day (QID) | RESPIRATORY_TRACT | 2 refills | Status: AC | PRN

## 2022-12-10 NOTE — Telephone Encounter (Signed)
Wendall Stade, MD  to Me    12/10/22  1:00 PM She can have albuterol inhaler. They Rx with antibiotics and tessalon perles CXR interpretation is pending   Called patient and informed her that an inhaler has been sent to her pharmacy. Patient verbalized understanding.

## 2022-12-24 ENCOUNTER — Other Ambulatory Visit: Payer: Self-pay | Admitting: Internal Medicine

## 2022-12-24 DIAGNOSIS — K2100 Gastro-esophageal reflux disease with esophagitis, without bleeding: Secondary | ICD-10-CM
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
## 2023-01-01 ENCOUNTER — Encounter: Payer: Self-pay | Admitting: Family Medicine

## 2023-01-01 ENCOUNTER — Ambulatory Visit: Payer: No Typology Code available for payment source | Admitting: Family Medicine

## 2023-01-01 VITALS — BP 118/82 | HR 82 | Ht 64.0 in | Wt 160.0 lb

## 2023-01-01 DIAGNOSIS — M7062 Trochanteric bursitis, left hip: Secondary | ICD-10-CM | POA: Diagnosis not present

## 2023-01-01 NOTE — Assessment & Plan Note (Signed)
Chronic problem with worsening symptoms.  Discussed with patient about icing regimen and home exercises.  Discussed with patient that I do feel that there is a possibility of more of a lumbar radiculopathy.  Need to continue to monitor.  Discussed which activities to do and which ones to avoid.  Increase activity slowly.  Follow-up again in 6 to 8 weeks

## 2023-01-01 NOTE — Patient Instructions (Addendum)
Injected L GT today Ice for 20 min can help Stay active See me again in 2-3 months

## 2023-01-09 ENCOUNTER — Other Ambulatory Visit: Payer: Self-pay | Admitting: Internal Medicine

## 2023-01-09 DIAGNOSIS — D520 Dietary folate deficiency anemia: Secondary | ICD-10-CM

## 2023-01-14 ENCOUNTER — Ambulatory Visit (INDEPENDENT_AMBULATORY_CARE_PROVIDER_SITE_OTHER): Payer: No Typology Code available for payment source | Admitting: Internal Medicine

## 2023-01-14 ENCOUNTER — Encounter: Payer: Self-pay | Admitting: Internal Medicine

## 2023-01-14 VITALS — BP 110/70 | HR 78 | Ht 64.0 in | Wt 157.0 lb

## 2023-01-14 DIAGNOSIS — M6289 Other specified disorders of muscle: Secondary | ICD-10-CM

## 2023-01-14 DIAGNOSIS — N816 Rectocele: Secondary | ICD-10-CM

## 2023-01-14 DIAGNOSIS — K5904 Chronic idiopathic constipation: Secondary | ICD-10-CM | POA: Diagnosis not present

## 2023-01-14 MED ORDER — LINACLOTIDE 290 MCG PO CAPS: 290.0000 ug | ORAL_CAPSULE | Freq: Every day | ORAL | 11 refills | Status: DC

## 2023-01-14 NOTE — Patient Instructions (Signed)
We have sent the following medications to your pharmacy for you to pick up at your convenience: Linzess 290 mcg.  We are providing you with Linzess 290 mcg. Samples today.  Your PCP can refill the Linzess for you.   I appreciate the opportunity to care for you. Stan Head, MD, [facility]
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
## 2023-01-26 ENCOUNTER — Other Ambulatory Visit: Payer: Self-pay | Admitting: Cardiovascular Disease

## 2023-01-31 ENCOUNTER — Other Ambulatory Visit: Payer: Self-pay

## 2023-01-31 MED ORDER — SPIRONOLACTONE 25 MG PO TABS: 25.0000 mg | ORAL_TABLET | Freq: Every day | ORAL | 1 refills | Status: DC

## 2023-02-05 ENCOUNTER — Encounter: Payer: Self-pay | Admitting: Internal Medicine

## 2023-02-05 ENCOUNTER — Ambulatory Visit (INDEPENDENT_AMBULATORY_CARE_PROVIDER_SITE_OTHER): Payer: No Typology Code available for payment source | Admitting: Internal Medicine

## 2023-02-05 VITALS — BP 118/76 | HR 63 | Temp 98.7°F | Ht 64.0 in | Wt 160.0 lb

## 2023-02-05 DIAGNOSIS — L239 Allergic contact dermatitis, unspecified cause: Secondary | ICD-10-CM | POA: Diagnosis not present

## 2023-02-05 MED ORDER — PREDNISONE 20 MG PO TABS: 20.0000 mg | ORAL_TABLET | Freq: Every day | ORAL | 0 refills | Status: AC

## 2023-02-05 MED ORDER — CLOBETASOL PROPIONATE 0.05 % EX CREA: 1.0000 | TOPICAL_CREAM | Freq: Two times a day (BID) | CUTANEOUS | 0 refills | Status: DC

## 2023-02-05 NOTE — Progress Notes (Signed)
Subjective:    Patient ID: Carolyn [name], female    DOB: 21-Sep-1960, 62 y.o.   MRN: [id number]      HPI Carolyn [name] is here for  Chief Complaint  Patient presents with   Rash    Rash on neck that itches (noticed last week)     She noticed it 3 weeks - it is intermittent - clears up and then comes back.  The rash itches on b/l sides and anterior aspect of neck.  She denies any rash elsewhere.  She has tried several things on it without improvement including cortisone 10, Aquaphor, triamcinolone cream and Shea butter.  She states none of them helped.     She denies any new products including laundry detergent, topical creams.  She denies any new clothing or shirts.  She does not wear necklaces.  She denies any known food allergies.  She does take her Xyzal every night.  The itching is very bothersome.    Medications and allergies reviewed with patient and updated if appropriate.  Current Outpatient Medications on File Prior to Visit  Medication Sig Dispense Refill   acetaminophen (TYLENOL) 500 MG tablet Take 1,000 mg by mouth every 6 (six) hours as needed for mild pain or headache.     albuterol (VENTOLIN HFA) 108 (90 Base) MCG/ACT inhaler Inhale 2 puffs into the lungs every 6 (six) hours as needed for wheezing or shortness of breath. 8 g 2   aspirin EC 81 MG tablet Take 81 mg by mouth daily. Swallow whole.     benzonatate (TESSALON) 100 MG capsule Take 1 capsule (100 mg total) by mouth 2 (two) times daily as needed for cough. 20 capsule 0   Blood Glucose Monitoring Suppl (ONETOUCH VERIO IQ SYSTEM) W/DEVICE KIT 1 Act by Does not apply route 3 (three) times daily. 2 kit 0   calcium carbonate (OS-CAL - DOSED IN MG OF ELEMENTAL CALCIUM) 1250 (500 Ca) MG tablet Take 2 tablets by mouth daily with breakfast.     carvedilol (COREG) 25 MG tablet Take 1 tablet by mouth twice daily 180 tablet 1   cholecalciferol (VITAMIN D3) 25 MCG (1000 UNIT) tablet Take 2,000 Units by mouth daily.      clonazePAM (KLONOPIN) 1 MG tablet Take 1 tablet by mouth twice daily as needed for anxiety 60 tablet 1   EPINEPHrine 0.3 mg/0.3 mL IJ SOAJ injection Inject 0.3 mg into the muscle as needed for anaphylaxis. 1 each 0   fenofibrate (TRICOR) 145 MG tablet Take 1 tablet by mouth once daily 30 tablet 11   fluticasone (FLONASE) 50 MCG/ACT nasal spray Place 1 spray into both nostrils daily as needed for allergies or rhinitis.     folic acid (FOLVITE) 1 MG tablet Take 1 tablet by mouth once daily 90 tablet 0   glucose blood (ONE TOUCH ULTRA TEST) test strip Use to test blood sugar three times a week and prn if having symptoms of low blood sugar Dx: 250.92 90 day supply 100 each 11   hydrALAZINE (APRESOLINE) 50 MG tablet TAKE 1 TABLET BY MOUTH THREE TIMES DAILY 270 tablet 0   hydrocortisone (ANUSOL-HC) 2.5 % rectal cream Place 1 Application rectally 2 (two) times daily as needed for hemorrhoids or anal itching. 30 g 1   hydrocortisone (CORTEF) 10 MG tablet Take 1 tablet (10 mg total) by mouth 2 (two) times daily. 60 tablet 0   Insulin Pen Needle (BD PEN NEEDLE MICRO U/F) 32G X 6 MM MISC  Inject 1 Units into the skin once a week. 100 each 3   iron polysaccharides (NIFEREX) 150 MG capsule Take 150 mg by mouth daily.     isosorbide dinitrate (ISORDIL) 20 MG tablet TAKE 1 TABLET BY MOUTH THREE TIMES DAILY 270 tablet 3   levocetirizine (XYZAL) 5 MG tablet Take 5 mg by mouth every evening.     lidocaine (LIDODERM) 5 % Place 1 patch onto the skin daily. Remove & Discard patch within 12 hours or as directed by MD 30 patch 0   linaclotide (LINZESS) 290 MCG CAPS capsule Take 1 capsule (290 mcg total) by mouth daily before breakfast. 30 capsule 11   Multiple Vitamins-Minerals (HAIR SKIN AND NAILS FORMULA) TABS Take 1 tablet by mouth daily.     omega-3 acid ethyl esters (LOVAZA) 1 g capsule Take 2 capsules by mouth twice daily 360 capsule 1   omeprazole (PRILOSEC) 40 MG capsule TAKE 1 CAPSULE BY MOUTH TWICE DAILY TAKE  AT   8AM  AND  10PM 60 capsule 0   rosuvastatin (CRESTOR) 20 MG tablet Take 1 tablet (20 mg total) by mouth daily. 90 tablet 3   sacubitril-valsartan (ENTRESTO) 97-103 MG Take 1 tablet by mouth twice daily 60 tablet 7   Semaglutide, 2 MG/DOSE, (OZEMPIC, 2 MG/DOSE,) 8 MG/3ML SOPN INJECT 2 MG  SUBCUTANEOUSLY ONCE A WEEK 3 mL 11   spironolactone (ALDACTONE) 25 MG tablet Take 1 tablet (25 mg total) by mouth daily. 90 tablet 1   tiZANidine (ZANAFLEX) 2 MG tablet Take 1 tablet (2 mg total) by mouth at bedtime as needed for muscle spasms. 30 tablet 0   zinc gluconate 50 MG tablet Take 1 tablet (50 mg total) by mouth daily. 90 tablet 1   budesonide-formoterol (SYMBICORT) 80-4.5 MCG/ACT inhaler Inhale 2 puffs into the lungs 2 (two) times daily. 1 each 0   No current facility-administered medications on file prior to visit.    Review of Systems     Objective:   Vitals:   02/05/23 1424  BP: 118/76  Pulse: 63  Temp: 98.7 F (37.1 C)  SpO2: 97%   BP Readings from Last 3 Encounters:  02/05/23 118/76  01/14/23 110/70  01/01/23 118/82   Wt Readings from Last 3 Encounters:  02/05/23 160 lb (72.6 kg)  01/14/23 157 lb (71.2 kg)  01/01/23 160 lb (72.6 kg)   Body mass index is 27.46 kg/m.    Physical Exam Constitutional:      General: She is not in acute distress.    Appearance: Normal appearance. She is not ill-appearing.  HENT:     Head: Normocephalic and atraumatic.  Skin:    General: Skin is warm and dry.     Findings: Rash (Hyperpigmented dry rash bilateral lower sides of neck and anterior aspect of neck.  No rash elsewhere.) present.  Neurological:     Mental Status: She is alert.            Assessment & Plan:    Contact dermatitis: Acute Unknown cause She has tried several things without improvement including triamcinolone cream which is surprising Clobetasol cream twice daily x 14 days Prednisone 20 mg daily x 5 days Continue Xyzal at night Has follow-up with PCP in  a couple of weeks

## 2023-02-05 NOTE — Patient Instructions (Addendum)
      Medications changes include :   clobetasol cream twice daily for 14 days.   Prednisone 20 mg daily x 5 days - take with food       Return if symptoms worsen or fail to improve.

## 2023-02-17 ENCOUNTER — Ambulatory Visit: Payer: No Typology Code available for payment source

## 2023-02-17 ENCOUNTER — Encounter: Payer: Self-pay | Admitting: Internal Medicine

## 2023-02-17 ENCOUNTER — Ambulatory Visit (INDEPENDENT_AMBULATORY_CARE_PROVIDER_SITE_OTHER): Payer: No Typology Code available for payment source | Admitting: Internal Medicine

## 2023-02-17 VITALS — BP 122/80 | HR 88 | Temp 98.2°F | Resp 16 | Ht 64.0 in | Wt 157.0 lb

## 2023-02-17 DIAGNOSIS — I10 Essential (primary) hypertension: Secondary | ICD-10-CM

## 2023-02-17 DIAGNOSIS — R052 Subacute cough: Secondary | ICD-10-CM

## 2023-02-17 DIAGNOSIS — E519 Thiamine deficiency, unspecified: Secondary | ICD-10-CM

## 2023-02-17 DIAGNOSIS — D520 Dietary folate deficiency anemia: Secondary | ICD-10-CM | POA: Diagnosis not present

## 2023-02-17 DIAGNOSIS — Z124 Encounter for screening for malignant neoplasm of cervix: Secondary | ICD-10-CM | POA: Insufficient documentation

## 2023-02-17 DIAGNOSIS — Z0001 Encounter for general adult medical examination with abnormal findings: Secondary | ICD-10-CM | POA: Insufficient documentation

## 2023-02-17 DIAGNOSIS — E118 Type 2 diabetes mellitus with unspecified complications: Secondary | ICD-10-CM | POA: Diagnosis not present

## 2023-02-17 DIAGNOSIS — E785 Hyperlipidemia, unspecified: Secondary | ICD-10-CM | POA: Diagnosis not present

## 2023-02-17 DIAGNOSIS — D538 Other specified nutritional anemias: Secondary | ICD-10-CM

## 2023-02-17 LAB — CBC WITH DIFFERENTIAL/PLATELET
Basophils Absolute: 0.1 10*3/uL (ref 0.0–0.1)
Basophils Relative: 0.8 % (ref 0.0–3.0)
Eosinophils Absolute: 0.2 10*3/uL (ref 0.0–0.7)
Eosinophils Relative: 2.5 % (ref 0.0–5.0)
HCT: 32.7 % — ABNORMAL LOW (ref 36.0–46.0)
Hemoglobin: 10.6 g/dL — ABNORMAL LOW (ref 12.0–15.0)
Lymphocytes Relative: 43.8 % (ref 12.0–46.0)
Lymphs Abs: 2.9 10*3/uL (ref 0.7–4.0)
MCHC: 32.3 g/dL (ref 30.0–36.0)
MCV: 94.9 fL (ref 78.0–100.0)
Monocytes Absolute: 0.5 10*3/uL (ref 0.1–1.0)
Monocytes Relative: 7.4 % (ref 3.0–12.0)
Neutro Abs: 3 10*3/uL (ref 1.4–7.7)
Neutrophils Relative %: 45.5 % (ref 43.0–77.0)
Platelets: 290 10*3/uL (ref 150.0–400.0)
RBC: 3.45 Mil/uL — ABNORMAL LOW (ref 3.87–5.11)
RDW: 13.5 % (ref 11.5–15.5)
WBC: 6.7 10*3/uL (ref 4.0–10.5)

## 2023-02-17 LAB — LIPID PANEL
Cholesterol: 137 mg/dL (ref 0–200)
HDL: 47.4 mg/dL (ref >39.00)
LDL Cholesterol: 69 mg/dL (ref 0–99)
NonHDL: 89.88
Total CHOL/HDL Ratio: 3
Triglycerides: 104 mg/dL (ref 0.0–149.0)
VLDL: 20.8 mg/dL (ref 0.0–40.0)

## 2023-02-17 LAB — BASIC METABOLIC PANEL
BUN: 21 mg/dL (ref 6–23)
CO2: 28 meq/L (ref 19–32)
Calcium: 9.8 mg/dL (ref 8.4–10.5)
Chloride: 105 meq/L (ref 96–112)
Creatinine, Ser: 1.08 mg/dL (ref 0.40–1.20)
GFR: 55.64 mL/min — ABNORMAL LOW (ref >60.00)
Glucose, Bld: 71 mg/dL (ref 70–99)
Potassium: 3.9 meq/L (ref 3.5–5.1)
Sodium: 141 meq/L (ref 135–145)

## 2023-02-17 LAB — MICROALBUMIN / CREATININE URINE RATIO
Creatinine,U: 263.6 mg/dL
Microalb Creat Ratio: 5.4 mg/g (ref 0.0–30.0)
Microalb, Ur: 14.2 mg/dL — ABNORMAL HIGH (ref 0.0–1.9)

## 2023-02-17 NOTE — Progress Notes (Unsigned)
Subjective:  Patient ID: Carolyn [name], female    DOB: 1961-03-23  Age: 62 y.o. MRN: [id number]  CC: Annual Exam, Anemia, Cough, Diabetes, and Hyperlipidemia   HPI Carolyn [name] presents for a CPX and f/up   ------  Discussed the use of AI scribe software for clinical note transcription with the patient, who gave verbal consent to proceed.  History of Present Illness   The patient, with a history of adrenal insufficiency, presents with a week-long illness characterized by cough, fatigue, and rib discomfort due to persistent coughing. The cough is productive with clear sputum. She experienced shortness of breath for a couple of days at the onset of the illness, but denies any chest pain. She reports night chills and a sore throat when the illness started. She denies hemoptysis.  The patient also reports a recent episode of a neck rash, which has since resolved. She describes it as a 'scaling' sensation, localized to the front of the neck. The cause of the rash remains unknown.  She has been feeling weak and her blood pressure was noted to be low. She has been taking steroids for adrenal insufficiency as prescribed by her endocrinologist.  The patient also mentions a recent course of antibiotics, prescribed during a visit to the hospital for an unrelated issue. She decided to take the antibiotics in response to her current illness.  She denies any changes in bowel movements. She expresses uncertainty about the status of her heart health, but does not report any specific cardiac symptoms.  The patient is due for a follow-up for her diabetes, but her last A1c two months ago was well-controlled at 5.6%. She is scheduled to travel out of town in the near future.       Outpatient Medications Prior to Visit  Medication Sig Dispense Refill   acetaminophen (TYLENOL) 500 MG tablet Take 1,000 mg by mouth every 6 (six) hours as needed for mild pain or headache.     albuterol (VENTOLIN HFA) 108  (90 Base) MCG/ACT inhaler Inhale 2 puffs into the lungs every 6 (six) hours as needed for wheezing or shortness of breath. 8 g 2   aspirin EC 81 MG tablet Take 81 mg by mouth daily. Swallow whole.     benzonatate (TESSALON) 100 MG capsule Take 1 capsule (100 mg total) by mouth 2 (two) times daily as needed for cough. 20 capsule 0   Blood Glucose Monitoring Suppl (ONETOUCH VERIO IQ SYSTEM) W/DEVICE KIT 1 Act by Does not apply route 3 (three) times daily. 2 kit 0   calcium carbonate (OS-CAL - DOSED IN MG OF ELEMENTAL CALCIUM) 1250 (500 Ca) MG tablet Take 2 tablets by mouth daily with breakfast.     carvedilol (COREG) 25 MG tablet Take 1 tablet by mouth twice daily 180 tablet 1   cholecalciferol (VITAMIN D3) 25 MCG (1000 UNIT) tablet Take 2,000 Units by mouth daily.     clobetasol cream (TEMOVATE) 0.05 % Apply 1 Application topically 2 (two) times daily. 15 g 0   clonazePAM (KLONOPIN) 1 MG tablet Take 1 tablet by mouth twice daily as needed for anxiety 60 tablet 1   EPINEPHrine 0.3 mg/0.3 mL IJ SOAJ injection Inject 0.3 mg into the muscle as needed for anaphylaxis. 1 each 0   fenofibrate (TRICOR) 145 MG tablet Take 1 tablet by mouth once daily 30 tablet 11   fluticasone (FLONASE) 50 MCG/ACT nasal spray Place 1 spray into both nostrils daily as needed for allergies or rhinitis.  folic acid (FOLVITE) 1 MG tablet Take 1 tablet by mouth once daily 90 tablet 0   glucose blood (ONE TOUCH ULTRA TEST) test strip Use to test blood sugar three times a week and prn if having symptoms of low blood sugar Dx: 250.92 90 day supply 100 each 11   hydrALAZINE (APRESOLINE) 50 MG tablet TAKE 1 TABLET BY MOUTH THREE TIMES DAILY 270 tablet 0   hydrocortisone (ANUSOL-HC) 2.5 % rectal cream Place 1 Application rectally 2 (two) times daily as needed for hemorrhoids or anal itching. 30 g 1   hydrocortisone (CORTEF) 10 MG tablet Take 1 tablet (10 mg total) by mouth 2 (two) times daily. 60 tablet 0   Insulin Pen Needle (BD PEN  NEEDLE MICRO U/F) 32G X 6 MM MISC Inject 1 Units into the skin once a week. 100 each 3   iron polysaccharides (NIFEREX) 150 MG capsule Take 150 mg by mouth daily.     isosorbide dinitrate (ISORDIL) 20 MG tablet TAKE 1 TABLET BY MOUTH THREE TIMES DAILY 270 tablet 3   levocetirizine (XYZAL) 5 MG tablet Take 5 mg by mouth every evening.     lidocaine (LIDODERM) 5 % Place 1 patch onto the skin daily. Remove & Discard patch within 12 hours or as directed by MD 30 patch 0   linaclotide (LINZESS) 290 MCG CAPS capsule Take 1 capsule (290 mcg total) by mouth daily before breakfast. 30 capsule 11   Multiple Vitamins-Minerals (HAIR SKIN AND NAILS FORMULA) TABS Take 1 tablet by mouth daily.     omega-3 acid ethyl esters (LOVAZA) 1 g capsule Take 2 capsules by mouth twice daily 360 capsule 1   omeprazole (PRILOSEC) 40 MG capsule TAKE 1 CAPSULE BY MOUTH TWICE DAILY TAKE  AT  8AM  AND  10PM 60 capsule 0   rosuvastatin (CRESTOR) 20 MG tablet Take 1 tablet (20 mg total) by mouth daily. 90 tablet 3   sacubitril-valsartan (ENTRESTO) 97-103 MG Take 1 tablet by mouth twice daily 60 tablet 7   Semaglutide, 2 MG/DOSE, (OZEMPIC, 2 MG/DOSE,) 8 MG/3ML SOPN INJECT 2 MG  SUBCUTANEOUSLY ONCE A WEEK 3 mL 11   spironolactone (ALDACTONE) 25 MG tablet Take 1 tablet (25 mg total) by mouth daily. 90 tablet 1   tiZANidine (ZANAFLEX) 2 MG tablet Take 1 tablet (2 mg total) by mouth at bedtime as needed for muscle spasms. 30 tablet 0   zinc gluconate 50 MG tablet Take 1 tablet (50 mg total) by mouth daily. 90 tablet 1   budesonide-formoterol (SYMBICORT) 80-4.5 MCG/ACT inhaler Inhale 2 puffs into the lungs 2 (two) times daily. 1 each 0   No facility-administered medications prior to visit.    ROS Review of Systems  Constitutional:  Positive for fatigue. Negative for appetite change, chills, diaphoresis, fever and unexpected weight change.  HENT:  Positive for sore throat. Negative for postnasal drip, rhinorrhea, sinus pressure,  sinus pain and trouble swallowing.   Eyes:  Negative for visual disturbance.  Respiratory:  Positive for cough and shortness of breath. Negative for chest tightness and wheezing.   Cardiovascular:  Negative for chest pain, palpitations and leg swelling.  Gastrointestinal: Negative.  Negative for abdominal pain.  Genitourinary: Negative.  Negative for difficulty urinating.  Musculoskeletal: Negative.   Skin: Negative.   Neurological:  Negative for dizziness and weakness.  Hematological:  Negative for adenopathy. Does not bruise/bleed easily.  Psychiatric/Behavioral: Negative.      Objective:  BP 122/80 (BP Location: Left Arm, Patient Position: Sitting,  Cuff Size: Normal)   Pulse 88   Temp 98.2 F (36.8 C) (Oral)   Resp 16   Ht 5\' 4"  (1.626 m)   Wt 157 lb (71.2 kg)   SpO2 93%   BMI 26.95 kg/m   BP Readings from Last 3 Encounters:  02/17/23 122/80  02/05/23 118/76  01/14/23 110/70    Wt Readings from Last 3 Encounters:  02/17/23 157 lb (71.2 kg)  02/05/23 160 lb (72.6 kg)  01/14/23 157 lb (71.2 kg)    Physical Exam Vitals reviewed.  Constitutional:      Appearance: Normal appearance. She is not ill-appearing.  HENT:     Nose: Nose normal.     Mouth/Throat:     Mouth: Mucous membranes are moist.  Eyes:     General: No scleral icterus.    Conjunctiva/sclera: Conjunctivae normal.  Cardiovascular:     Rate and Rhythm: Normal rate and regular rhythm.     Heart sounds: No murmur heard.    No friction rub. No gallop.     Comments: EKG- NSR, 94 bpm LAE, LVH - unchanged No Q waves or acute ST/T wave changes Pulmonary:     Effort: Pulmonary effort is normal.     Breath sounds: No stridor. No wheezing, rhonchi or rales.  Abdominal:     General: Abdomen is flat.     Palpations: There is no mass.     Tenderness: There is no abdominal tenderness. There is no guarding.     Hernia: No hernia is present.  Musculoskeletal:        General: Normal range of motion.      Cervical back: Neck supple.     Right lower leg: No edema.     Left lower leg: No edema.  Lymphadenopathy:     Cervical: No cervical adenopathy.  Skin:    General: Skin is warm and dry.  Neurological:     General: No focal deficit present.     Mental Status: She is alert. Mental status is at baseline.  Psychiatric:        Mood and Affect: Mood normal.        Behavior: Behavior normal.     Lab Results  Component Value Date   WBC 6.7 02/17/2023   HGB 10.6 (L) 02/17/2023   HCT 32.7 (L) 02/17/2023   PLT 290.0 02/17/2023   GLUCOSE 71 02/17/2023   CHOL 137 02/17/2023   TRIG 104.0 02/17/2023   HDL 47.40 02/17/2023   LDLDIRECT 84 03/16/2021   LDLCALC 69 02/17/2023   ALT 12 12/06/2022   AST 15 12/06/2022   NA 141 02/17/2023   K 3.9 02/17/2023   CL 105 02/17/2023   CREATININE 1.08 02/17/2023   BUN 21 02/17/2023   CO2 28 02/17/2023   TSH 2.06 07/01/2022   INR 1.04 01/14/2013   HGBA1C 5.6 12/06/2022   MICROALBUR 14.2 (H) 02/17/2023    DG Chest Portable 1 View  Result Date: 11/16/2022 CLINICAL DATA:  Cough, shortness of breath EXAM: PORTABLE CHEST 1 VIEW COMPARISON:  08/30/2022 FINDINGS: Cardiomegaly. Both lungs are clear. The visualized skeletal structures are unremarkable. IMPRESSION: Cardiomegaly without acute abnormality of the lungs in AP portable projection. Electronically Signed   By: Jearld Lesch M.D.   On: 11/16/2022 09:19    DG Chest 2 View  Result Date: 02/17/2023 CLINICAL DATA:  Cough. EXAM: CHEST - 2 VIEW COMPARISON:  December 06, 2022. FINDINGS: Stable cardiomediastinal silhouette. Both lungs are clear. The visualized skeletal structures are  unremarkable. IMPRESSION: No active cardiopulmonary disease. Electronically Signed   By: Lupita Raider M.D.   On: 02/17/2023 17:21     Assessment & Plan:   Subacute cough- CXR is negative for mass/infiltrate. -     DG Chest 2 View; Future  Dietary folate deficiency anemia -     CBC with Differential/Platelet;  Future  Anemia due to zinc deficiency -     CBC with Differential/Platelet; Future  Anemia due to acquired thiamine deficiency -     CBC with Differential/Platelet; Future  Type II diabetes mellitus with manifestations (HCC) -     Basic metabolic panel; Future -     Urinalysis, Routine w reflex microscopic; Future -     Microalbumin / creatinine urine ratio; Future  Hyperlipidemia with target LDL less than 100 - LDL goal achieved. Doing well on the statin  -     Lipid panel; Future  Essential hypertension- BP is well controlled. -     Basic metabolic panel; Future -     Urinalysis, Routine w reflex microscopic; Future  Encounter for general adult medical examination with abnormal findings - Exam completed, labs reviewed, vaccines reviewed, cancer screenings addressed, pt ed material was given.   Cervical cancer screening -     Ambulatory referral to Gynecology     Follow-up: Return in about 6 months (around 08/17/2023).  Sanda Linger, MD

## 2023-02-17 NOTE — Patient Instructions (Signed)

## 2023-02-18 LAB — URINALYSIS, ROUTINE W REFLEX MICROSCOPIC
Hgb urine dipstick: NEGATIVE
Ketones, ur: 15 — AB
Nitrite: NEGATIVE
RBC / HPF: NONE SEEN (ref 0–?)
Specific Gravity, Urine: 1.025 (ref 1.000–1.030)
Total Protein, Urine: 30 — AB
Urine Glucose: NEGATIVE
Urobilinogen, UA: 0.2 (ref 0.0–1.0)
pH: 6 (ref 5.0–8.0)

## 2023-02-18 MED ORDER — DELSYM 15 MG PO TABS
1.0000 | ORAL_TABLET | Freq: Two times a day (BID) | ORAL | 0 refills | Status: DC | PRN
Start: 2023-02-18 — End: 2023-06-12

## 2023-02-19 ENCOUNTER — Other Ambulatory Visit: Payer: Self-pay | Admitting: Cardiovascular Disease

## 2023-03-01 ENCOUNTER — Other Ambulatory Visit: Payer: Self-pay | Admitting: Cardiovascular Disease

## 2023-03-03 ENCOUNTER — Encounter: Payer: Self-pay | Admitting: Internal Medicine

## 2023-03-03 ENCOUNTER — Other Ambulatory Visit: Payer: Self-pay | Admitting: Cardiovascular Disease

## 2023-03-04 ENCOUNTER — Other Ambulatory Visit: Payer: Self-pay | Admitting: Internal Medicine

## 2023-03-04 DIAGNOSIS — R052 Subacute cough: Secondary | ICD-10-CM

## 2023-03-04 MED ORDER — HYDROCODONE BIT-HOMATROP MBR 5-1.5 MG/5ML PO SOLN: 5.0000 mL | Freq: Three times a day (TID) | ORAL | 0 refills | Status: DC | PRN
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
## 2023-03-05 ENCOUNTER — Ambulatory Visit: Payer: No Typology Code available for payment source | Admitting: Emergency Medicine

## 2023-03-05 ENCOUNTER — Encounter: Payer: Self-pay | Admitting: Emergency Medicine

## 2023-03-05 ENCOUNTER — Ambulatory Visit: Payer: No Typology Code available for payment source | Admitting: Family Medicine

## 2023-03-05 VITALS — BP 128/80 | HR 51 | Temp 98.8°F | Ht 64.0 in | Wt 159.0 lb

## 2023-03-05 DIAGNOSIS — J22 Unspecified acute lower respiratory infection: Secondary | ICD-10-CM

## 2023-03-05 DIAGNOSIS — R062 Wheezing: Secondary | ICD-10-CM

## 2023-03-05 DIAGNOSIS — R053 Chronic cough: Secondary | ICD-10-CM

## 2023-03-05 MED ORDER — PREDNISONE 20 MG PO TABS
40.0000 mg | ORAL_TABLET | Freq: Every day | ORAL | 0 refills | Status: AC
Start: 2023-03-05 — End: 2023-03-10

## 2023-03-05 MED ORDER — HYDROCODONE BIT-HOMATROP MBR 5-1.5 MG/5ML PO SOLN
5.0000 mL | Freq: Three times a day (TID) | ORAL | 0 refills | Status: AC | PRN
Start: 2023-03-05 — End: 2023-03-13

## 2023-03-05 MED ORDER — AZITHROMYCIN 250 MG PO TABS: ORAL_TABLET | ORAL | 0 refills | Status: DC

## 2023-03-05 MED ORDER — BENZONATATE 200 MG PO CAPS: 200.0000 mg | ORAL_CAPSULE | Freq: Two times a day (BID) | ORAL | 0 refills | Status: DC | PRN

## 2023-03-05 NOTE — Assessment & Plan Note (Signed)
Cough management discussed Recommend over-the-counter Mucinex DM along with cough drops Tessalon 200 mg 3 times a day Hycodan syrup at night Advised to rest and stay well-hydrated

## 2023-03-05 NOTE — Assessment & Plan Note (Signed)
Can [place] to airway inflammation.  No history of asthma.  Non-smoker.  No history of COPD. Will benefit from daily prednisone 40 mg for 5 days Has albuterol inhaler at home. Advised to use 2 puffs 2-3 times a day for the next 5 days
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
## 2023-03-05 NOTE — Assessment & Plan Note (Signed)
Viral upper respiratory infection now with secondary bacterial infection.  Recommend daily azithromycin for 5 days. Clinically stable.  No signs of pneumonia. Normal recent chest x-ray. Nonspecific chest pain most likely pleuritic in nature ED precautions given Advised to rest and stay well-hydrated Symptom management discussed Advised to contact the office if no better or worse during the next several days

## 2023-03-05 NOTE — Patient Instructions (Addendum)
Continue albuterol 2 puffs at least twice a day Start prednisone 40 mg daily Start azithromycin daily for 5 days Start Tessalon 200 mg 3 times a day for cough Hycodan syrup as needed for cough  Acute Bronchitis, Adult  Acute bronchitis is when air tubes in the lungs (bronchi) suddenly get swollen. The condition can make it hard for you to breathe. In adults, acute bronchitis usually goes away within 2 weeks. A cough caused by bronchitis may last up to 3 weeks. Smoking, allergies, and asthma can make the condition worse. What are the causes? Germs that cause cold and flu (viruses). The most common cause of this condition is the virus that causes the common cold. Bacteria. Substances that bother (irritate) the lungs, including: Smoke from cigarettes and other types of tobacco. Dust and pollen. Fumes from chemicals, gases, or burned fuel. Indoor or outdoor air pollution. What increases the risk? A weak body's defense system. This is also called the immune system. Any condition that affects your lungs and breathing, such as asthma. What are the signs or symptoms? A cough. Coughing up clear, yellow, or green mucus. Making high-pitched whistling sounds when you breathe, most often when you breathe out (wheezing). Runny or stuffy nose. Having too much mucus in your lungs (chest congestion). Shortness of breath. Body aches. A sore throat. How is this treated? Acute bronchitis may go away over time without treatment. Your doctor may tell you to: Drink more fluids. This will help thin your mucus so it is easier to cough up. Use a device that gets medicine into your lungs (inhaler). Use a vaporizer or a humidifier. These are machines that add water to the air. This helps with coughing and poor breathing. Take a medicine that thins mucus and helps clear it from your lungs. Take a medicine that prevents or stops coughing. It is not common to take an antibiotic medicine for this  condition. Follow these instructions at home:  Take over-the-counter and prescription medicines only as told by your doctor. Use an inhaler, vaporizer, or humidifier as told by your doctor. Take two teaspoons (10 mL) of honey at bedtime. This helps lessen your coughing at night. Drink enough fluid to keep your pee (urine) pale yellow. Do not smoke or use any products that contain nicotine or tobacco. If you need help quitting, ask your doctor. Get a lot of rest. Return to your normal activities when your doctor says that it is safe. Keep all follow-up visits. How is this prevented?  Wash your hands often with soap and water for at least 20 seconds. If you cannot use soap and water, use hand sanitizer. Avoid contact with people who have cold symptoms. Try not to touch your mouth, nose, or eyes with your hands. Avoid breathing in smoke or chemical fumes. Make sure to get the flu shot every year. Contact a doctor if: Your symptoms do not get better in 2 weeks. You have trouble coughing up the mucus. Your cough keeps you awake at night. You have a fever. Get help right away if: You cough up blood. You have chest pain. You have very bad shortness of breath. You faint or keep feeling like you are going to faint. You have a very bad headache. Your fever or chills get worse. These symptoms may be an emergency. Get help right away. Call your local emergency services (911 in the [place] Do not wait to see if the symptoms will go away. Do not drive yourself to the hospital.  Summary Acute bronchitis is when air tubes in the lungs (bronchi) suddenly get swollen. In adults, acute bronchitis usually goes away within 2 weeks. Drink more fluids. This will help thin your mucus so it is easier to cough up. Take over-the-counter and prescription medicines only as told by your doctor. Contact a doctor if your symptoms do not improve after 2 weeks of treatment. This information is not intended to  replace advice given to you by your health care provider. Make sure you discuss any questions you have with your health care provider. Document Revised: 08/02/2020 Document Reviewed: 08/02/2020 Elsevier Patient Education  2024 [employer].

## 2023-03-06 ENCOUNTER — Other Ambulatory Visit: Payer: Self-pay | Admitting: Internal Medicine

## 2023-03-06 DIAGNOSIS — K2100 Gastro-esophageal reflux disease with esophagitis, without bleeding: Secondary | ICD-10-CM

## 2023-03-06 DIAGNOSIS — K219 Gastro-esophageal reflux disease without esophagitis: Secondary | ICD-10-CM

## 2023-03-06 MED ORDER — VOQUEZNA 10 MG PO TABS
1.0000 | ORAL_TABLET | Freq: Every day | ORAL | 0 refills | Status: DC
Start: 2023-03-06 — End: 2023-06-12

## 2023-03-08 ENCOUNTER — Other Ambulatory Visit: Payer: Self-pay | Admitting: Internal Medicine

## 2023-03-08 DIAGNOSIS — K2100 Gastro-esophageal reflux disease with esophagitis, without bleeding: Secondary | ICD-10-CM

## 2023-03-10 ENCOUNTER — Telehealth: Payer: Self-pay

## 2023-03-10 NOTE — Telephone Encounter (Signed)
*  Primary  Pharmacy Patient Advocate Encounter   Received notification from CoverMyMeds that prior authorization for Voquezna 10MG  tablets  is required/requested.   Insurance verification completed.   The patient is insured through [facility] .   Per test claim: PA required; PA submitted to above mentioned insurance via CoverMyMeds Key/confirmation #/EOC [id number] Status is pending

## 2023-03-11 NOTE — Telephone Encounter (Signed)
Pharmacy Patient Advocate Encounter  Received notification from [facility] that Prior Authorization for Voquezna 10mg  tabs has been DENIED.  See denial reason below. No denial letter attached in CMM. Will attach denial letter to Media tab once received.   PA #/Case ID/Reference #: [id number]

## 2023-03-24 ENCOUNTER — Other Ambulatory Visit: Payer: Self-pay | Admitting: Internal Medicine

## 2023-03-24 ENCOUNTER — Other Ambulatory Visit: Payer: Self-pay | Admitting: Cardiovascular Disease

## 2023-03-24 ENCOUNTER — Other Ambulatory Visit: Payer: Self-pay | Admitting: Family Medicine

## 2023-03-24 DIAGNOSIS — R062 Wheezing: Secondary | ICD-10-CM

## 2023-03-24 DIAGNOSIS — D520 Dietary folate deficiency anemia: Secondary | ICD-10-CM

## 2023-03-24 DIAGNOSIS — K2100 Gastro-esophageal reflux disease with esophagitis, without bleeding: Secondary | ICD-10-CM

## 2023-03-24 MED ORDER — FOLIC ACID 1 MG PO TABS: 1.0000 mg | ORAL_TABLET | Freq: Every day | ORAL | 0 refills | Status: DC

## 2023-04-21 ENCOUNTER — Other Ambulatory Visit: Payer: Self-pay | Admitting: Cardiovascular Disease

## 2023-04-21 ENCOUNTER — Other Ambulatory Visit (INDEPENDENT_AMBULATORY_CARE_PROVIDER_SITE_OTHER): Payer: No Typology Code available for payment source | Admitting: Internal Medicine

## 2023-04-21 ENCOUNTER — Telehealth (INDEPENDENT_AMBULATORY_CARE_PROVIDER_SITE_OTHER): Payer: No Typology Code available for payment source | Admitting: Internal Medicine

## 2023-04-21 ENCOUNTER — Encounter: Payer: Self-pay | Admitting: Internal Medicine

## 2023-04-21 DIAGNOSIS — N3000 Acute cystitis without hematuria: Secondary | ICD-10-CM | POA: Diagnosis not present

## 2023-04-21 DIAGNOSIS — R300 Dysuria: Secondary | ICD-10-CM

## 2023-04-21 LAB — URINALYSIS, ROUTINE W REFLEX MICROSCOPIC
Hgb urine dipstick: NEGATIVE
Ketones, ur: 15 — AB
Nitrite: POSITIVE — AB
Specific Gravity, Urine: >=1.03 — AB (ref 1.000–1.030)
Total Protein, Urine: 100 — AB
Urine Glucose: NEGATIVE
Urobilinogen, UA: 1 (ref 0.0–1.0)
pH: 6 (ref 5.0–8.0)

## 2023-04-21 LAB — POC URINALSYSI DIPSTICK (AUTOMATED)
Blood, UA: NEGATIVE
Glucose, UA: NEGATIVE
Nitrite, UA: NEGATIVE
Protein, UA: POSITIVE — AB
Spec Grav, UA: 1.025 (ref 1.010–1.025)
Urobilinogen, UA: 1 U/dL
pH, UA: 5.5 (ref 5.0–8.0)

## 2023-04-21 MED ORDER — SULFAMETHOXAZOLE-TRIMETHOPRIM 800-160 MG PO TABS: 1.0000 | ORAL_TABLET | Freq: Two times a day (BID) | ORAL | 0 refills | Status: AC

## 2023-04-21 NOTE — Addendum Note (Signed)
 Addended by: Henderson Cloud on: 04/21/2023 02:34 PM   Modules accepted: Orders

## 2023-04-21 NOTE — Progress Notes (Addendum)
 Virtual Visit via Video Note  I connected with Carolyn [name] on 04/21/23 at  1:30 PM EST by a video enabled telemedicine application and verified that I am speaking with the correct person using two identifiers.  Location patient: home Location provider: work office Persons participating in the virtual visit: patient, provider  I discussed the limitations of evaluation and management by telemedicine and the availability of in person appointments. The patient expressed understanding and agreed to proceed.   HPI: She feels like she has a urinary tract infection.  For the past 2 days has been having dysuria, increased urinary frequency and a foul odor to her urine.  Has not noticed any blood.  These are the same symptoms she had about a year ago with a UTI as well.   ROS: Negative unless indicated in HPI.  Past Medical History:  Diagnosis Date   Allergy    seasonal   Anemia    Anemia of chronic disease suspected   Anxiety attack    Arthritis    Asthmatic bronchitis 04/26/2021   Benign fundic gland polyps of stomach    Cardiomyopathy (HCC)    Dyslipidemia    Exertional shortness of breath    GERD (gastroesophageal reflux disease)    Heart murmur    Hyperlipidemia    Hypertension    Migraines    probably weekly (01/14/2013)   Type II diabetes mellitus (HCC)     Past Surgical History:  Procedure Laterality Date   CHOLECYSTECTOMY  1990   COLONOSCOPY     ENDOMETRIAL ABLATION  ~ 2000-2002   twice (01/14/2013)   LEFT HEART CATH AND CORONARY ANGIOGRAPHY N/A 06/01/2021   Procedure: LEFT HEART CATH AND CORONARY ANGIOGRAPHY;  Surgeon: Claudene Victory ORN, MD;  [facility];  Service: Cardiovascular;  Laterality: N/A;   LEFT HEART CATHETERIZATION WITH CORONARY ANGIOGRAM N/A 01/15/2013   Procedure: LEFT HEART CATHETERIZATION WITH CORONARY ANGIOGRAM;  Surgeon: Ozell JONETTA Fell, MD;  Location: [facility] CATH LAB;  Service: Cardiovascular;  Laterality: N/A;   LEFT  OOPHORECTOMY Left ~ 2003   ORIF TIBIA & FIBULA FRACTURES Left 2003   VAGINAL HYSTERECTOMY  ~ 2003    Family History  Problem Relation Age of Onset   Alzheimer's disease Mother    Hypertension Mother    Emphysema Father    Thyroid  cancer Sister    Cancer Sister        Breast Cancer   Diabetes Sister    Breast cancer Sister    Diabetes Sister    Emphysema Sister    Diabetes Brother    Colon cancer Neg Hx     SOCIAL HX:   reports that she has never smoked. She has never used smokeless tobacco. She reports that she does not drink alcohol and does not use drugs.   Current Outpatient Medications:    sulfamethoxazole -trimethoprim  (BACTRIM  DS) 800-160 MG tablet, Take 1 tablet by mouth 2 (two) times daily for 7 days., Disp: 14 tablet, Rfl: 0   acetaminophen  (TYLENOL ) 500 MG tablet, Take 1,000 mg by mouth every 6 (six) hours as needed for mild pain or headache., Disp: , Rfl:    albuterol  (VENTOLIN  HFA) 108 (90 Base) MCG/ACT inhaler, Inhale 2 puffs into the lungs every 6 (six) hours as needed for wheezing or shortness of breath., Disp: 8 g, Rfl: 2   aspirin  EC 81 MG tablet, Take 81 mg by mouth daily. Swallow whole., Disp: , Rfl:    azithromycin  (ZITHROMAX ) 250  MG tablet, Sig as indicated, Disp: 6 tablet, Rfl: 0   benzonatate  (TESSALON ) 200 MG capsule, Take 1 capsule (200 mg total) by mouth 2 (two) times daily as needed for cough., Disp: 20 capsule, Rfl: 0   Blood Glucose Monitoring Suppl (ONETOUCH VERIO IQ SYSTEM) W/DEVICE KIT, 1 Act by Does not apply route 3 (three) times daily., Disp: 2 kit, Rfl: 0   calcium  carbonate (OS-CAL - DOSED IN MG OF ELEMENTAL CALCIUM ) 1250 (500 Ca) MG tablet, Take 2 tablets by mouth daily with breakfast., Disp: , Rfl:    carvedilol  (COREG ) 25 MG tablet, Take 1 tablet by mouth twice daily, Disp: 180 tablet, Rfl: 1   cholecalciferol (VITAMIN D3) 25 MCG (1000 UNIT) tablet, Take 2,000 Units by mouth daily., Disp: , Rfl:    clobetasol  cream (TEMOVATE ) 0.05 %, Apply 1  Application topically 2 (two) times daily., Disp: 15 g, Rfl: 0   clonazePAM  (KLONOPIN ) 1 MG tablet, Take 1 tablet by mouth twice daily as needed for anxiety, Disp: 60 tablet, Rfl: 1   Dextromethorphan  HBr (DELSYM ) 15 MG TABS, Take 1 tablet by mouth 2 (two) times daily as needed., Disp: 60 tablet, Rfl: 0   EPINEPHrine  0.3 mg/0.3 mL IJ SOAJ injection, Inject 0.3 mg into the muscle as needed for anaphylaxis., Disp: 1 each, Rfl: 0   fenofibrate  (TRICOR ) 145 MG tablet, Take 1 tablet by mouth once daily, Disp: 30 tablet, Rfl: 11   fluticasone  (FLONASE ) 50 MCG/ACT nasal spray, Place 1 spray into both nostrils daily as needed for allergies or rhinitis., Disp: , Rfl:    folic acid  (FOLVITE ) 1 MG tablet, Take 1 tablet (1 mg total) by mouth daily., Disp: 90 tablet, Rfl: 0   glucose blood (ONE TOUCH ULTRA TEST) test strip, Use to test blood sugar three times a week and prn if having symptoms of low blood sugar Dx: 250.92 90 day supply, Disp: 100 each, Rfl: 11   hydrALAZINE  (APRESOLINE ) 50 MG tablet, TAKE 1 TABLET BY MOUTH THREE TIMES DAILY, Disp: 270 tablet, Rfl: 0   hydrocortisone  (ANUSOL -HC) 2.5 % rectal cream, Place 1 Application rectally 2 (two) times daily as needed for hemorrhoids or anal itching., Disp: 30 g, Rfl: 1   Insulin  Pen Needle (BD PEN NEEDLE MICRO U/F) 32G X 6 MM MISC, Inject 1 Units into the skin once a week., Disp: 100 each, Rfl: 3   iron  polysaccharides (NIFEREX) 150 MG capsule, Take 150 mg by mouth daily., Disp: , Rfl:    isosorbide  dinitrate (ISORDIL ) 20 MG tablet, TAKE 1 TABLET BY MOUTH THREE TIMES DAILY, Disp: 270 tablet, Rfl: 3   levocetirizine (XYZAL) 5 MG tablet, Take 5 mg by mouth every evening., Disp: , Rfl:    lidocaine  (LIDODERM ) 5 %, Place 1 patch onto the skin daily. Remove & Discard patch within 12 hours or as directed by MD, Disp: 30 patch, Rfl: 0   linaclotide  (LINZESS ) 290 MCG CAPS capsule, Take 1 capsule (290 mcg total) by mouth daily before breakfast., Disp: 30 capsule, Rfl:  11   Multiple Vitamins-Minerals (HAIR SKIN AND NAILS FORMULA) TABS, Take 1 tablet by mouth daily., Disp: , Rfl:    omega-3 acid ethyl esters (LOVAZA ) 1 g capsule, Take 2 capsules by mouth twice daily, Disp: 360 capsule, Rfl: 0   OZEMPIC , 2 MG/DOSE, 8 MG/3ML SOPN, INJECT 2MG  INTO THE SKIN ONCE A WEEK, Disp: 3 mL, Rfl: 0   rosuvastatin  (CRESTOR ) 20 MG tablet, Take 1 tablet (20 mg total) by mouth daily., Disp: 90  tablet, Rfl: 3   sacubitril -valsartan  (ENTRESTO ) 97-103 MG, Take 1 tablet by mouth twice daily, Disp: 60 tablet, Rfl: 7   spironolactone  (ALDACTONE ) 25 MG tablet, Take 1 tablet (25 mg total) by mouth daily., Disp: 90 tablet, Rfl: 1   SYMBICORT  80-4.5 MCG/ACT inhaler, Inhale 2 puffs by mouth twice daily, Disp: 11 g, Rfl: 0   tiZANidine  (ZANAFLEX ) 2 MG tablet, Take 1 tablet (2 mg total) by mouth at bedtime as needed for muscle spasms., Disp: 30 tablet, Rfl: 0   Vonoprazan Fumarate  (VOQUEZNA ) 10 MG TABS, Take 1 tablet by mouth daily., Disp: 90 tablet, Rfl: 0   zinc  gluconate 50 MG tablet, Take 1 tablet (50 mg total) by mouth daily., Disp: 90 tablet, Rfl: 1  EXAM:   VITALS per patient if applicable: None reported  GENERAL: alert, oriented, appears well and in no acute distress  HEENT: atraumatic, conjunttiva clear, no obvious abnormalities on inspection of external nose and ears  NECK: normal movements of the head and neck  LUNGS: on inspection no signs of respiratory distress, breathing rate appears normal, no obvious gross increased work of breathing, gasping or wheezing  CV: no obvious cyanosis  MS: moves all visible extremities without noticeable abnormality  PSYCH/NEURO: pleasant and cooperative, no obvious depression or anxiety, speech and thought processing grossly intact  ASSESSMENT AND PLAN:   Acute cystitis without hematuria - Plan: sulfamethoxazole -trimethoprim  (BACTRIM  DS) 800-160 MG tablet  -She is close to the office and will stop by to provide a urine sample.  If  urine dipstick is indicative of UTI will send for urine culture and antibiotics.  Addendum: In office urine dipstick is positive for nitrates, leukocytes.  Send Bactrim  and urine culture.   I discussed the assessment and treatment plan with the patient. The patient was provided an opportunity to ask questions and all were answered. The patient agreed with the plan and demonstrated an understanding of the instructions.   The patient was advised to call back or seek an in-person evaluation if the symptoms worsen or if the condition fails to improve as anticipated.    Tully Theophilus Andrews, MD  [facility]

## 2023-04-23 LAB — URINE CULTURE
MICRO NUMBER:: 15921684
SPECIMEN QUALITY:: ADEQUATE

## 2023-05-08 ENCOUNTER — Other Ambulatory Visit: Payer: Self-pay

## 2023-05-08 MED ORDER — EPINEPHRINE 0.3 MG/0.3ML IJ SOAJ: 0.3000 mg | INTRAMUSCULAR | 0 refills | Status: AC | PRN

## 2023-05-20 LAB — HEMOGLOBIN A1C: Hemoglobin A1C: 5.5

## 2023-05-20 LAB — TSH: TSH: 2.56 (ref 0.41–5.90)

## 2023-05-28 ENCOUNTER — Other Ambulatory Visit: Payer: Self-pay | Admitting: Cardiovascular Disease
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
## 2023-06-12 ENCOUNTER — Ambulatory Visit
Payer: No Typology Code available for payment source | Attending: Cardiovascular Disease | Admitting: Cardiovascular Disease

## 2023-06-12 ENCOUNTER — Encounter: Payer: Self-pay | Admitting: Cardiovascular Disease

## 2023-06-12 VITALS — BP 114/80 | HR 82 | Ht 64.0 in | Wt 165.4 lb

## 2023-06-12 DIAGNOSIS — E785 Hyperlipidemia, unspecified: Secondary | ICD-10-CM

## 2023-06-12 DIAGNOSIS — E118 Type 2 diabetes mellitus with unspecified complications: Secondary | ICD-10-CM | POA: Diagnosis not present

## 2023-06-12 DIAGNOSIS — I10 Essential (primary) hypertension: Secondary | ICD-10-CM | POA: Diagnosis not present

## 2023-06-12 DIAGNOSIS — I428 Other cardiomyopathies: Secondary | ICD-10-CM

## 2023-06-12 NOTE — Patient Instructions (Signed)
 Medication Instructions:  Your physician recommends that you continue on your current medications as directed. Please refer to the Current Medication list given to you today. *If you need a refill on your cardiac medications before your next appointment, please call your pharmacy*  Testing/Procedures: Echocardiogram Your physician has requested that you have an echocardiogram. Echocardiography is a painless test that uses sound waves to create images of your heart. It provides your doctor with information about the size and shape of your heart and how well your heart's chambers and valves are working. This procedure takes approximately one hour. There are no restrictions for this procedure. Please do NOT wear cologne, perfume, aftershave, or lotions (deodorant is allowed). Please arrive 15 minutes prior to your appointment time.  Please note: We ask at that you not bring children with you during ultrasound (echo/ vascular) testing. Due to room size and safety concerns, children are not allowed in the ultrasound rooms during exams. Our front office staff cannot provide observation of children in our lobby area while testing is being conducted. An adult accompanying a patient to their appointment will only be allowed in the ultrasound room at the discretion of the ultrasound technician under special circumstances. We apologize for any inconvenience.   Follow-Up: At [facility], you and your health needs are our priority.  As part of our continuing mission to provide you with exceptional heart care, we have created designated Provider Care Teams.  These Care Teams include your primary Cardiologist (physician) and Advanced Practice Providers (APPs -  Physician Assistants and Nurse Practitioners) who all work together to provide you with the care you need, when you need it.  We recommend signing up for the patient portal called "MyChart".  Sign up information is provided on this After Visit  Summary.  MyChart is used to connect with patients for Virtual Visits (Telemedicine).  Patients are able to view lab/test results, encounter notes, upcoming appointments, etc.  Non-urgent messages can be sent to your provider as well.   To learn more about what you can do with MyChart, go to [email or web address].    Your next appointment:   1 year(s)  Provider:   Charlton Haws, MD

## 2023-06-27 ENCOUNTER — Other Ambulatory Visit: Payer: Self-pay | Admitting: Cardiovascular Disease

## 2023-06-27 ENCOUNTER — Other Ambulatory Visit: Payer: Self-pay | Admitting: Internal Medicine

## 2023-06-27 DIAGNOSIS — D520 Dietary folate deficiency anemia: Secondary | ICD-10-CM

## 2023-07-03 ENCOUNTER — Telehealth: Payer: Self-pay

## 2023-07-03 ENCOUNTER — Ambulatory Visit (HOSPITAL_COMMUNITY): Payer: No Typology Code available for payment source | Attending: Cardiovascular Disease

## 2023-07-03 DIAGNOSIS — E118 Type 2 diabetes mellitus with unspecified complications: Secondary | ICD-10-CM | POA: Insufficient documentation

## 2023-07-03 DIAGNOSIS — I428 Other cardiomyopathies: Secondary | ICD-10-CM | POA: Diagnosis present

## 2023-07-03 DIAGNOSIS — I10 Essential (primary) hypertension: Secondary | ICD-10-CM | POA: Diagnosis not present

## 2023-07-03 DIAGNOSIS — E785 Hyperlipidemia, unspecified: Secondary | ICD-10-CM | POA: Diagnosis not present

## 2023-07-03 DIAGNOSIS — I420 Dilated cardiomyopathy: Secondary | ICD-10-CM

## 2023-07-03 LAB — ECHOCARDIOGRAM COMPLETE
Area-P 1/2: 3.54 cm2
MV M vel: 4.98 m/s
MV Peak grad: 99.2 mmHg
S' Lateral: 4.1 cm

## 2023-07-03 NOTE — Telephone Encounter (Signed)
 Will placed order for BMET and BNP.

## 2023-07-03 NOTE — Telephone Encounter (Signed)
-----   Message from Charlton Haws sent at 07/03/2023  3:57 PM EDT ----- She has no CAD can have her check BMET/BNP ----- Message ----- From: Ethelda Chick, RN Sent: 07/03/2023   3:51 PM EDT To: Wendall Stade, MD  The patient has been notified of the result and verbalized understanding.  All questions (if any) were answered. Cindi Carbon Bell Buckle, RN 07/03/2023 3:39 PM    Patient complaining of feeling weak, SOB with activity for 2 weeks, and one day she felt like her heart was constricted, and felt more relief after removing clothing. Patient stated her BP is good and not sure of her HR.

## 2023-07-05 LAB — BASIC METABOLIC PANEL
BUN/Creatinine Ratio: 16 (ref 12–28)
BUN: 16 mg/dL (ref 8–27)
CO2: 19 mmol/L — ABNORMAL LOW (ref 20–29)
Calcium: 9.6 mg/dL (ref 8.7–10.3)
Chloride: 106 mmol/L (ref 96–106)
Creatinine, Ser: 0.99 mg/dL (ref 0.57–1.00)
Glucose: 91 mg/dL (ref 70–99)
Potassium: 4.4 mmol/L (ref 3.5–5.2)
Sodium: 141 mmol/L (ref 134–144)
eGFR: 65 mL/min/{1.73_m2} (ref >59)

## 2023-07-05 LAB — PRO B NATRIURETIC PEPTIDE: NT-Pro BNP: 158 pg/mL (ref 0–287)

## 2023-07-06 ENCOUNTER — Other Ambulatory Visit: Payer: Self-pay | Admitting: Internal Medicine

## 2023-07-06 DIAGNOSIS — F411 Generalized anxiety disorder: Secondary | ICD-10-CM

## 2023-07-08 ENCOUNTER — Ambulatory Visit: Attending: Cardiovascular Disease | Admitting: Pharmacist

## 2023-07-08 ENCOUNTER — Encounter: Payer: Self-pay | Admitting: Pharmacist

## 2023-07-08 VITALS — BP 112/76 | HR 84 | Wt 166.0 lb

## 2023-07-08 DIAGNOSIS — E118 Type 2 diabetes mellitus with unspecified complications: Secondary | ICD-10-CM

## 2023-07-08 DIAGNOSIS — R931 Abnormal findings on diagnostic imaging of heart and coronary circulation: Secondary | ICD-10-CM | POA: Diagnosis not present

## 2023-07-08 MED ORDER — HYDRALAZINE HCL 50 MG PO TABS: 25.0000 mg | ORAL_TABLET | Freq: Three times a day (TID) | ORAL | Status: DC

## 2023-07-08 MED ORDER — CARVEDILOL 25 MG PO TABS: 37.5000 mg | ORAL_TABLET | Freq: Two times a day (BID) | ORAL | 3 refills | Status: AC

## 2023-07-08 NOTE — Progress Notes (Signed)
 Patient ID: Carolyn [name]                 DOB: 28-Aug-1960                      MRN: [id number]     HPI: Carolyn [name] is a 63 y.o. female referred by Dr. Eden Emms to pharmacy clinic for HF medication management. PMH is significant for HTN, OSA, anemia, DM and recent diagnosis of DCM. Most recent LVEF 35-40% (07/03/23). In Feb 2023 EF was 25-30%, had improved to 50% in April 2023.    At previous pharmD visits patient was slowly titrated off of clonidine and her GDMT was optimized. She was enrolled in the [facility] prep class. Her Rybelsus was changed to Ozempic. Her home blood pressures reported were at goal and no HF medication changes were made. Her statin was changed from simvastatin to rosuvastatin for better LDL-C and ApoB reduction. She has been on SGLT2i in the past and had yeast infection. Is prone to yeast infections. She was seen in the ED on 06/02/21 with chest pain and elevated troponin. Cath was clean. Thought to be stress cardiomyopathy. I last saw patient 08/16/21.   She last saw Dr. Eden Emms 06/12/23. Echo showed decrease in EF to 35-40%. MRI 10/03/21 EF 44%. Has been diagnosed with adrenal insufficiency.  On hydrocortisone.  Patient presents today for follow-up.  She reports that she has gained close to 30 pounds in the last 4 months.  Endocrinology tried to put her on Mounjaro but per patient report they could not find an A1c high enough to submit to insurance.  We have record of A1c of 6.9.    Has a lot of fatigue.  Does not eat much.  Lately a lot of fruits and vegetables.  Not much protein.  She is still exercising but no longer doing any strength training.  Walking 4 to 5 days a week.  Heart rate typically in the 80s.  Blood pressure well-controlled.  Current CHF meds: hydralazine 50 mg TID, isosorbide dinitrate 20 mg TID, spironolactone 25 mg daily, Entresto 97/103 mg BID, carvedilol 25 mg BID Other BP Meds: Previously tried: Invokana (yeast infection) BP goal: <130/80   Family  History: The patient's family history includes Alzheimer's disease in her mother; Breast cancer in her sister; Cancer in her sister; Diabetes in her brother, sister, and sister; Emphysema in her father and sister; Hypertension in her mother; Thyroid cancer in her sister. There is no history of Colon cancer.  Home BP: Forgot log book  Exercise: walking 4-5 times a week  Diet: breakfast: Boiled egg, piece of bacon ,sausage sandwich lunch:   Dinner:  Snack: Popcorn, vegetables Drink: water  Wt Readings from Last 3 Encounters:  06/12/23 165 lb 6.4 oz (75 kg)  03/05/23 159 lb (72.1 kg)  02/17/23 157 lb (71.2 kg)   BP Readings from Last 3 Encounters:  06/12/23 114/80  03/05/23 128/80  02/17/23 122/80   Pulse Readings from Last 3 Encounters:  06/12/23 82  03/05/23 (!) 51  02/17/23 88    Renal function: CrCl cannot be calculated (Unknown ideal weight.).  Past Medical History:  Diagnosis Date   Allergy    seasonal   Anemia    Anemia of chronic disease suspected   Anxiety attack    Arthritis    Asthmatic bronchitis 04/26/2021   Benign fundic gland polyps of stomach    Cardiomyopathy (HCC)    Dyslipidemia  Exertional shortness of breath    GERD (gastroesophageal reflux disease)    Heart murmur    Hyperlipidemia    Hypertension    Migraines    "probably weekly" (01/14/2013)   Type II diabetes mellitus (HCC)     Current Outpatient Medications on File Prior to Visit  Medication Sig Dispense Refill   acetaminophen (TYLENOL) 500 MG tablet Take 1,000 mg by mouth every 6 (six) hours as needed for mild pain or headache.     albuterol (VENTOLIN HFA) 108 (90 Base) MCG/ACT inhaler Inhale 2 puffs into the lungs every 6 (six) hours as needed for wheezing or shortness of breath. 8 g 2   aspirin EC 81 MG tablet Take 81 mg by mouth daily. Swallow whole.     Blood Glucose Monitoring Suppl (ONETOUCH VERIO IQ SYSTEM) W/DEVICE KIT 1 Act by Does not apply route 3 (three) times daily. 2 kit  0   calcium carbonate (OS-CAL - DOSED IN MG OF ELEMENTAL CALCIUM) 1250 (500 Ca) MG tablet Take 2 tablets by mouth daily with breakfast.     carvedilol (COREG) 25 MG tablet Take 1 tablet by mouth twice daily 180 tablet 0   cholecalciferol (VITAMIN D3) 25 MCG (1000 UNIT) tablet Take 2,000 Units by mouth daily.     EPINEPHrine 0.3 mg/0.3 mL IJ SOAJ injection Inject 0.3 mg into the muscle as needed for anaphylaxis. 1 each 0   fenofibrate (TRICOR) 145 MG tablet Take 1 tablet by mouth once daily 30 tablet 11   fluticasone (FLONASE) 50 MCG/ACT nasal spray Place 1 spray into both nostrils daily as needed for allergies or rhinitis.     folic acid (FOLVITE) 1 MG tablet Take 1 tablet (1 mg total) by mouth daily. Schedule an appt for further refills 30 tablet 0   glucose blood (ONE TOUCH ULTRA TEST) test strip Use to test blood sugar three times a week and prn if having symptoms of low blood sugar Dx: 250.92 90 day supply 100 each 11   hydrALAZINE (APRESOLINE) 50 MG tablet TAKE 1 TABLET BY MOUTH THREE TIMES DAILY 270 tablet 0   Insulin Pen Needle (BD PEN NEEDLE MICRO U/F) 32G X 6 MM MISC Inject 1 Units into the skin once a week. 100 each 3   iron polysaccharides (NIFEREX) 150 MG capsule Take 150 mg by mouth daily.     isosorbide dinitrate (ISORDIL) 20 MG tablet TAKE 1 TABLET BY MOUTH THREE TIMES DAILY 270 tablet 3   linaclotide (LINZESS) 290 MCG CAPS capsule Take 1 capsule (290 mcg total) by mouth daily before breakfast. 30 capsule 11   Multiple Vitamins-Minerals (HAIR SKIN AND NAILS FORMULA) TABS Take 1 tablet by mouth daily.     omega-3 acid ethyl esters (LOVAZA) 1 g capsule Take 2 capsules by mouth twice daily 360 capsule 0   rosuvastatin (CRESTOR) 20 MG tablet Take 1 tablet by mouth once daily 90 tablet 0   sacubitril-valsartan (ENTRESTO) 97-103 MG Take 1 tablet by mouth 2 (two) times daily. 180 tablet 3   Semaglutide, 2 MG/DOSE, (OZEMPIC, 2 MG/DOSE,) 8 MG/3ML SOPN INJECT 2MG  INTO THE SKIN ONCE A WEEK 3 mL  11   spironolactone (ALDACTONE) 25 MG tablet Take 1 tablet (25 mg total) by mouth daily. 90 tablet 1   SYMBICORT 80-4.5 MCG/ACT inhaler Inhale 2 puffs by mouth twice daily 11 g 0   zinc gluconate 50 MG tablet Take 1 tablet (50 mg total) by mouth daily. 90 tablet 1   No current  facility-administered medications on file prior to visit.    Allergies  Allergen Reactions   Invokana [Canagliflozin] Other (See Comments)    YEAST INFECTIONS   Codeine Nausea And Vomiting   Doxycycline     GI upset, made feel "weird"   Flexeril [Cyclobenzaprine]     sedation   Morphine And Codeine Itching   Reglan [Metoclopramide] Other (See Comments)    "paralyzes me"   Silicon     In watch bands     Assessment/Plan:  1. CHF -  BP is well-controlled.  EF has declined from 44% on MRI to 35 to 40% (on echo) despite optimal GDMT.  Cannot take SGLT2 due to yeast infections.  Heart rate in the 80s.  Blood pressure on the lower side.  Will decrease hydralazine to 25 mg 3 times daily and increase carvedilol to 37.5 mg twice daily.  Not convinced that ivabradine would be of much benefit.  No heart failure symptoms or exacerbations.   2. DM / weight loss -would like to change to [facility] since she is gaining weight on Ozempic.  Endocrinology was unable to get it approved but we have order lab records.  Will have PA team submit prior authorization for [facility].  Stressed the importance of resistance training and protein intake to avoid muscle wasting.  3.  Fatigue-found to have adrenal insufficiency.  Placed on hydrocortisone by endocrinology.  Does not feel like she is benefiting.  Complains of weight gain.  Dose of hydrocortisone was decreased.  Overall does not sound like she eats a whole lot.  She does not feel as though this is from Ozempic.  May eat breakfast, snack on some cucumbers or tomatoes or popcorn and may eat a meal before 330.  May have an apple for dinner.  Discussed the need for fueling her body  appropriately.  Great that she is eating fruits and vegetables but also needs to eat some protein.  Thank you,  Olene Floss, Pharm.D, BCACP, CPP [facility] [address] [place], [place] [address]  Phone: [phone]; Fax: [phone]

## 2023-07-08 NOTE — Patient Instructions (Addendum)
 Please decrease hydralazine to 25mg  three times a day Increase carvedilol 37.5mg  twice a day Continue isosorbide 20mg  three times a day, spironolactone 25mg  daily, Entresto 97/103mg  twice a day  We will work on [employer] for [employer]

## 2023-07-09 ENCOUNTER — Telehealth: Payer: Self-pay

## 2023-07-09 ENCOUNTER — Other Ambulatory Visit (HOSPITAL_COMMUNITY): Payer: Self-pay

## 2023-07-09 NOTE — Telephone Encounter (Signed)
-----   Message from Olene Floss sent at 07/08/2023  4:58 PM EDT ----- Please do PA for [facility]

## 2023-07-09 NOTE — Telephone Encounter (Signed)
 Pharmacy Patient Advocate Encounter   Received notification from Physician's Office that prior authorization for [facility] is required/requested.   Insurance verification completed.   The patient is insured through [facility] .   Per test claim: PA required; PA submitted to above mentioned insurance via CoverMyMeds Key/confirmation #/EOC [id number] Status is pending

## 2023-07-10 ENCOUNTER — Other Ambulatory Visit (HOSPITAL_COMMUNITY): Payer: Self-pay

## 2023-07-10 NOTE — Telephone Encounter (Signed)
 Pharmacy Patient Advocate Encounter  Received notification from [facility] that Prior Authorization for [facility] has been APPROVED from 07/09/23 to 07/08/24

## 2023-07-11 ENCOUNTER — Encounter: Payer: Self-pay | Admitting: Pharmacist

## 2023-07-11 MED ORDER — MOUNJARO 10 MG/0.5ML ~~LOC~~ SOAJ: 10.0000 mg | SUBCUTANEOUS | 0 refills | Status: DC

## 2023-07-11 NOTE — Addendum Note (Signed)
 Addended by: Malena Peer D on: 07/11/2023 09:00 AM   Modules accepted: Orders

## 2023-07-23 ENCOUNTER — Other Ambulatory Visit: Payer: Self-pay | Admitting: Internal Medicine

## 2023-07-23 DIAGNOSIS — D520 Dietary folate deficiency anemia: Secondary | ICD-10-CM

## 2023-07-29 ENCOUNTER — Ambulatory Visit: Payer: No Typology Code available for payment source | Admitting: Pharmacist

## 2023-07-31 ENCOUNTER — Other Ambulatory Visit: Payer: Self-pay | Admitting: Cardiovascular Disease

## 2023-08-04 ENCOUNTER — Other Ambulatory Visit: Payer: Self-pay | Admitting: Pharmacist

## 2023-08-05 MED ORDER — MOUNJARO 12.5 MG/0.5ML ~~LOC~~ SOAJ: 12.5000 mg | SUBCUTANEOUS | 0 refills | Status: DC

## 2023-08-05 NOTE — Addendum Note (Signed)
 Addended by: Gerard Cantara D on: 08/05/2023 11:50 AM   Modules accepted: Orders

## 2023-08-11 ENCOUNTER — Ambulatory Visit (INDEPENDENT_AMBULATORY_CARE_PROVIDER_SITE_OTHER): Admitting: Internal Medicine

## 2023-08-11 ENCOUNTER — Encounter: Payer: Self-pay | Admitting: Internal Medicine

## 2023-08-11 VITALS — BP 126/82 | HR 86 | Temp 98.6°F | Resp 16 | Ht 64.0 in | Wt 171.0 lb

## 2023-08-11 DIAGNOSIS — E118 Type 2 diabetes mellitus with unspecified complications: Secondary | ICD-10-CM | POA: Diagnosis not present

## 2023-08-11 DIAGNOSIS — Z7985 Long-term (current) use of injectable non-insulin antidiabetic drugs: Secondary | ICD-10-CM

## 2023-08-11 DIAGNOSIS — D520 Dietary folate deficiency anemia: Secondary | ICD-10-CM

## 2023-08-11 DIAGNOSIS — K219 Gastro-esophageal reflux disease without esophagitis: Secondary | ICD-10-CM

## 2023-08-11 DIAGNOSIS — F411 Generalized anxiety disorder: Secondary | ICD-10-CM

## 2023-08-11 DIAGNOSIS — D538 Other specified nutritional anemias: Secondary | ICD-10-CM

## 2023-08-11 DIAGNOSIS — F410 Panic disorder [episodic paroxysmal anxiety] without agoraphobia: Secondary | ICD-10-CM

## 2023-08-11 DIAGNOSIS — E519 Thiamine deficiency, unspecified: Secondary | ICD-10-CM

## 2023-08-11 MED ORDER — FOLIC ACID 1 MG PO TABS: 1.0000 mg | ORAL_TABLET | Freq: Every day | ORAL | 1 refills | Status: DC

## 2023-08-11 MED ORDER — OMEPRAZOLE 40 MG PO CPDR: 40.0000 mg | DELAYED_RELEASE_CAPSULE | Freq: Every day | ORAL | 0 refills | Status: DC

## 2023-08-11 MED ORDER — CLONAZEPAM 0.5 MG PO TABS: 0.5000 mg | ORAL_TABLET | Freq: Three times a day (TID) | ORAL | 0 refills | Status: AC | PRN

## 2023-08-11 MED ORDER — ZINC GLUCONATE 50 MG PO TABS: 50.0000 mg | ORAL_TABLET | Freq: Every day | ORAL | 1 refills | Status: AC

## 2023-08-11 NOTE — Progress Notes (Addendum)
 Subjective:  Patient ID: Carolyn [name], female    DOB: May 31, 1960  Age: 63 y.o. MRN: [id number]  CC: Hypertension   HPI Carolyn [name] presents for f/up ----  Discussed the use of AI scribe software for clinical note transcription with the patient, who gave verbal consent to proceed.  History of Present Illness   Carolyn [name] is a 63 year old female with adrenal insufficiency who presents with concerns about medication management and blood pressure control.  She is currently on a steroid for adrenal insufficiency, with the dosage recently reduced from 10 mg to 5 mg. She anticipates a possible discontinuation of the steroid in June as her numbers are improving.  She experiences low blood pressure and feels unwell but does not adjust her medications when this occurs. She is on multiple blood pressure medications, including Aldactone  and carvedilol , and is confused about the necessity of these medications given her low blood pressure.  She recalls having an EKG about two months ago and is concerned about her heart rate, which she says should be down to 60 but is often 80 to 90.  She was taken off omeprazole , which she found effective for indigestion, and reports that the alternative medication was ineffective. She now purchases omeprazole  over the counter despite its cost. She also requests a prescription for clonazepam  for anxiety, which she sometimes breaks in half, as her anxiety can be overwhelming.  No chest pain, shortness of breath, dizziness, or lightheadedness.       Outpatient Medications Prior to Visit  Medication Sig Dispense Refill   acetaminophen  (TYLENOL ) 500 MG tablet Take 1,000 mg by mouth every 6 (six) hours as needed for mild pain or headache.     albuterol  (VENTOLIN  HFA) 108 (90 Base) MCG/ACT inhaler Inhale 2 puffs into the lungs every 6 (six) hours as needed for wheezing or shortness of breath. 8 g 2   aspirin  EC 81 MG tablet Take 81 mg by mouth daily. Swallow  whole.     calcium  carbonate (OS-CAL - DOSED IN MG OF ELEMENTAL CALCIUM ) 1250 (500 Ca) MG tablet Take 2 tablets by mouth daily with breakfast.     carvedilol  (COREG ) 25 MG tablet Take 1.5 tablets (37.5 mg total) by mouth 2 (two) times daily. 270 tablet 3   cholecalciferol (VITAMIN D3) 25 MCG (1000 UNIT) tablet Take 2,000 Units by mouth daily.     EPINEPHrine  0.3 mg/0.3 mL IJ SOAJ injection Inject 0.3 mg into the muscle as needed for anaphylaxis. 1 each 0   fenofibrate  (TRICOR ) 145 MG tablet Take 1 tablet by mouth once daily 30 tablet 11   fluticasone  (FLONASE ) 50 MCG/ACT nasal spray Place 1 spray into both nostrils daily as needed for allergies or rhinitis.     hydrALAZINE  (APRESOLINE ) 50 MG tablet Take 0.5 tablets (25 mg total) by mouth 3 (three) times daily.     Insulin  Pen Needle (BD PEN NEEDLE MICRO U/F) 32G X 6 MM MISC Inject 1 Units into the skin once a week. 100 each 3   isosorbide  dinitrate (ISORDIL ) 20 MG tablet TAKE 1 TABLET BY MOUTH THREE TIMES DAILY 270 tablet 3   linaclotide  (LINZESS ) 290 MCG CAPS capsule Take 1 capsule (290 mcg total) by mouth daily before breakfast. 30 capsule 11   Multiple Vitamins-Minerals (HAIR SKIN AND NAILS FORMULA) TABS Take 1 tablet by mouth daily.     omega-3 acid ethyl esters (LOVAZA ) 1 g capsule Take 2 capsules by mouth twice daily 360 capsule  0   rosuvastatin  (CRESTOR ) 20 MG tablet Take 1 tablet by mouth once daily 90 tablet 0   sacubitril -valsartan  (ENTRESTO ) 97-103 MG Take 1 tablet by mouth 2 (two) times daily. 180 tablet 3   spironolactone  (ALDACTONE ) 25 MG tablet Take 1 tablet by mouth once daily 90 tablet 3   tirzepatide (MOUNJARO) 12.5 MG/0.5ML Pen Inject 12.5 mg into the skin once a week. 2 mL 0   folic acid  (FOLVITE ) 1 MG tablet TAKE 1 TABLET BY MOUTH ONCE DAILY . APPOINTMENT REQUIRED FOR FUTURE REFILLS 30 tablet 0   zinc  gluconate 50 MG tablet Take 1 tablet (50 mg total) by mouth daily. 90 tablet 1   Blood Glucose Monitoring Suppl (ONETOUCH VERIO  IQ SYSTEM) W/DEVICE KIT 1 Act by Does not apply route 3 (three) times daily. 2 kit 0   glucose blood (ONE TOUCH ULTRA TEST) test strip Use to test blood sugar three times a week and prn if having symptoms of low blood sugar Dx: 250.92 90 day supply 100 each 11   iron  polysaccharides (NIFEREX) 150 MG capsule Take 150 mg by mouth daily.     SYMBICORT  80-4.5 MCG/ACT inhaler Inhale 2 puffs by mouth twice daily 11 g 0   No facility-administered medications prior to visit.    ROS Review of Systems  Constitutional: Negative.  Negative for appetite change, chills, diaphoresis, fatigue and fever.  HENT: Negative.    Eyes: Negative.   Respiratory: Negative.  Negative for cough, chest tightness, shortness of breath and wheezing.   Cardiovascular:  Negative for chest pain, palpitations and leg swelling.  Gastrointestinal: Negative.  Negative for abdominal pain, constipation, diarrhea, nausea, rectal pain and vomiting.  Genitourinary: Negative.  Negative for difficulty urinating.  Musculoskeletal: Negative.  Negative for arthralgias and myalgias.  Neurological: Negative.  Negative for dizziness and weakness.  Hematological:  Negative for adenopathy. Does not bruise/bleed easily.  Psychiatric/Behavioral:  Negative for behavioral problems, confusion, decreased concentration, dysphoric mood, sleep disturbance and suicidal ideas. The patient is nervous/anxious.     Objective:  BP 126/82 (BP Location: Left Arm, Patient Position: Sitting, Cuff Size: Normal)   Pulse 86   Temp 98.6 F (37 C) (Temporal)   Resp 16   Ht 5\' 4"  (1.626 m)   Wt 171 lb (77.6 kg)   SpO2 97%   BMI 29.35 kg/m   BP Readings from Last 3 Encounters:  08/11/23 126/82  07/08/23 112/76  06/12/23 114/80    Wt Readings from Last 3 Encounters:  08/11/23 171 lb (77.6 kg)  07/08/23 166 lb (75.3 kg)  06/12/23 165 lb 6.4 oz (75 kg)    Physical Exam Vitals reviewed.  Constitutional:      Appearance: Normal appearance.  HENT:      Mouth/Throat:     Mouth: Mucous membranes are moist.  Eyes:     General: No scleral icterus.    Conjunctiva/sclera: Conjunctivae normal.  Cardiovascular:     Rate and Rhythm: Normal rate and regular rhythm.     Heart sounds: No murmur heard.    No friction rub. Gallop present.  Pulmonary:     Effort: Pulmonary effort is normal.     Breath sounds: No stridor. No wheezing, rhonchi or rales.  Abdominal:     General: Abdomen is flat.     Palpations: There is no mass.     Tenderness: There is no abdominal tenderness. There is no guarding.     Hernia: No hernia is present.  Musculoskeletal:  Cervical back: Neck supple.     Right lower leg: No edema.     Left lower leg: No edema.  Skin:    General: Skin is warm and dry.  Neurological:     General: No focal deficit present.     Mental Status: She is alert. Mental status is at baseline.  Psychiatric:        Mood and Affect: Mood normal.        Behavior: Behavior normal.     Lab Results  Component Value Date   WBC 5.2 08/18/2023   HGB 11.2 (L) 08/18/2023   HCT 33.0 (L) 08/18/2023   PLT 248.0 08/18/2023   GLUCOSE 82 08/18/2023   CHOL 137 02/17/2023   TRIG 104.0 02/17/2023   HDL 47.40 02/17/2023   LDLDIRECT 84 03/16/2021   LDLCALC 69 02/17/2023   ALT 12 12/06/2022   AST 15 12/06/2022   NA 140 08/18/2023   K 3.6 08/18/2023   CL 107 08/18/2023   CREATININE 0.93 08/18/2023   BUN 17 08/18/2023   CO2 25 08/18/2023   TSH 2.56 05/20/2023   INR 1.04 01/14/2013   HGBA1C 5.5 05/20/2023   MICROALBUR 14.2 (H) 02/17/2023    DG Chest Portable 1 View Result Date: 11/16/2022 CLINICAL DATA:  Cough, shortness of breath EXAM: PORTABLE CHEST 1 VIEW COMPARISON:  08/30/2022 FINDINGS: Cardiomegaly. Both lungs are clear. The visualized skeletal structures are unremarkable. IMPRESSION: Cardiomegaly without acute abnormality of the lungs in AP portable projection. Electronically Signed   By: Fredricka Jenny M.D.   On: 11/16/2022 09:19     Assessment & Plan:  Anemia due to acquired thiamine deficiency -     CBC with Differential/Platelet; Future  Anemia due to zinc  deficiency -     Zinc  Gluconate; Take 1 tablet (50 mg total) by mouth daily.  Dispense: 90 tablet; Refill: 1 -     CBC with Differential/Platelet; Future  Dietary folate deficiency anemia- H/H have improved. -     Folic Acid ; Take 1 tablet (1 mg total) by mouth daily.  Dispense: 90 tablet; Refill: 1 -     CBC with Differential/Platelet; Future  Gastroesophageal reflux disease without esophagitis- Will restart the PPI. -     Omeprazole ; Take 1 capsule (40 mg total) by mouth daily.  Dispense: 90 capsule; Refill: 0  Generalized anxiety disorder with panic attacks -     clonazePAM ; Take 1 tablet (0.5 mg total) by mouth 3 (three) times daily as needed for anxiety.  Dispense: 270 tablet; Refill: 0  Type II diabetes mellitus with manifestations (HCC)- Her blood sugar is well controlled. -     HM Diabetes Foot Exam -     Basic metabolic panel with GFR; Future     Follow-up: No follow-ups on file.  Sandra Crouch, MD

## 2023-08-13 ENCOUNTER — Encounter: Payer: Self-pay | Admitting: Internal Medicine

## 2023-08-18 ENCOUNTER — Other Ambulatory Visit (INDEPENDENT_AMBULATORY_CARE_PROVIDER_SITE_OTHER)

## 2023-08-18 DIAGNOSIS — E2740 Unspecified adrenocortical insufficiency: Secondary | ICD-10-CM

## 2023-08-18 DIAGNOSIS — E519 Thiamine deficiency, unspecified: Secondary | ICD-10-CM | POA: Diagnosis not present

## 2023-08-18 DIAGNOSIS — D538 Other specified nutritional anemias: Secondary | ICD-10-CM | POA: Diagnosis not present

## 2023-08-18 DIAGNOSIS — E118 Type 2 diabetes mellitus with unspecified complications: Secondary | ICD-10-CM

## 2023-08-18 DIAGNOSIS — D520 Dietary folate deficiency anemia: Secondary | ICD-10-CM

## 2023-08-18 LAB — CBC WITH DIFFERENTIAL/PLATELET
Basophils Absolute: 0.1 10*3/uL (ref 0.0–0.1)
Basophils Relative: 1.4 % (ref 0.0–3.0)
Eosinophils Absolute: 0.2 10*3/uL (ref 0.0–0.7)
Eosinophils Relative: 3.1 % (ref 0.0–5.0)
HCT: 33 % — ABNORMAL LOW (ref 36.0–46.0)
Hemoglobin: 11.2 g/dL — ABNORMAL LOW (ref 12.0–15.0)
Lymphocytes Relative: 37.1 % (ref 12.0–46.0)
Lymphs Abs: 1.9 10*3/uL (ref 0.7–4.0)
MCHC: 33.8 g/dL (ref 30.0–36.0)
MCV: 94 fl (ref 78.0–100.0)
Monocytes Absolute: 0.3 10*3/uL (ref 0.1–1.0)
Monocytes Relative: 5.7 % (ref 3.0–12.0)
Neutro Abs: 2.7 10*3/uL (ref 1.4–7.7)
Neutrophils Relative %: 52.7 % (ref 43.0–77.0)
Platelets: 248 10*3/uL (ref 150.0–400.0)
RBC: 3.52 Mil/uL — ABNORMAL LOW (ref 3.87–5.11)
RDW: 12.9 % (ref 11.5–15.5)
WBC: 5.2 10*3/uL (ref 4.0–10.5)

## 2023-08-18 LAB — BASIC METABOLIC PANEL WITH GFR
BUN: 17 mg/dL (ref 6–23)
CO2: 25 meq/L (ref 19–32)
Calcium: 9.3 mg/dL (ref 8.4–10.5)
Chloride: 107 meq/L (ref 96–112)
Creatinine, Ser: 0.93 mg/dL (ref 0.40–1.20)
GFR: 66.34 mL/min (ref >60.00)
Glucose, Bld: 82 mg/dL (ref 70–99)
Potassium: 3.6 meq/L (ref 3.5–5.1)
Sodium: 140 meq/L (ref 135–145)

## 2023-08-18 LAB — CORTISOL: Cortisol, Plasma: 12.3 ug/dL

## 2023-08-19 ENCOUNTER — Encounter: Payer: Self-pay | Admitting: Internal Medicine

## 2023-08-26 ENCOUNTER — Ambulatory Visit: Admitting: Family Medicine

## 2023-08-28 ENCOUNTER — Other Ambulatory Visit: Payer: Self-pay | Admitting: Cardiovascular Disease
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
## 2023-09-01 ENCOUNTER — Other Ambulatory Visit: Payer: Self-pay | Admitting: Cardiovascular Disease

## 2023-09-01 ENCOUNTER — Ambulatory Visit: Admitting: Family Medicine

## 2023-09-01 VITALS — BP 118/88 | Ht 64.0 in | Wt 169.0 lb

## 2023-09-01 DIAGNOSIS — M7062 Trochanteric bursitis, left hip: Secondary | ICD-10-CM

## 2023-09-01 DIAGNOSIS — R600 Localized edema: Secondary | ICD-10-CM | POA: Diagnosis not present

## 2023-09-01 MED ORDER — FUROSEMIDE 20 MG PO TABS: 20.0000 mg | ORAL_TABLET | Freq: Every day | ORAL | 0 refills | Status: AC

## 2023-09-01 MED ORDER — MOUNJARO 15 MG/0.5ML ~~LOC~~ SOAJ: 15.0000 mg | SUBCUTANEOUS | 11 refills | Status: AC

## 2023-09-01 NOTE — Assessment & Plan Note (Signed)
 Could be secondary to the adrenal insufficiency.  Patient's blood pressure is on the lower side of normal.  We discussed with her that I am a little bit concerned but feels like the excessive fluid is causing her to be slower.  Discussed using 1/2 pill for 2 days at a time and watch out for any type of orthostatic hypotension.  Only gave 20 pills.  Patient is going to be following up with endocrinology and likely with the adrenal insufficiency which could potentially be taken off of the hydrocortisone  depending on how she is responding.

## 2023-09-01 NOTE — Assessment & Plan Note (Addendum)
 Discussed with patient that this continues to reaccumulate secondary to muscle imbalances as well as a potential for him to be more of a lumbar radiculopathy.  He knows this is a potential problem as well.  Discussed icing regimen and home exercises, discussed which activities to do and which ones to avoid.  Follow-up with me again in 6 to 8 weeks otherwise.  Worsening pain may need to consider another epidural.  Patient continued to potentially lose weight.  Did have some mild pitting edema of the lower extremities which is new.  Given a very short course of Lasix  in case.

## 2023-09-01 NOTE — Addendum Note (Signed)
 Addended by: Ashea Winiarski D on: 09/01/2023 01:41 PM   Modules accepted: Orders

## 2023-09-01 NOTE — Patient Instructions (Addendum)
 Injection in GT today 1 pill daily for 2 days at a time as needed See you again in 3-4 months

## 2023-09-19 ENCOUNTER — Ambulatory Visit: Admitting: Sports Medicine

## 2023-09-19 ENCOUNTER — Ambulatory Visit

## 2023-09-19 VITALS — BP 140/72 | HR 83 | Ht 64.0 in

## 2023-09-19 DIAGNOSIS — M79672 Pain in left foot: Secondary | ICD-10-CM

## 2023-09-19 DIAGNOSIS — S93402A Sprain of unspecified ligament of left ankle, initial encounter: Secondary | ICD-10-CM | POA: Diagnosis not present

## 2023-09-19 DIAGNOSIS — M25572 Pain in left ankle and joints of left foot: Secondary | ICD-10-CM | POA: Diagnosis not present

## 2023-09-19 MED ORDER — MELOXICAM 15 MG PO TABS: 15.0000 mg | ORAL_TABLET | Freq: Every day | ORAL | 0 refills | Status: AC

## 2023-09-19 NOTE — Patient Instructions (Addendum)
 Recommended you get a lace up ankle brace. Gentle ankle HEP. Pain free when walking. If you have pain in the ankle brace then you should use the boot. - Start meloxicam  15 mg daily x2 weeks.  If still having pain after 2 weeks, complete 3rd-week of NSAID. May use remaining NSAID as needed once daily for pain control.  Do not to use additional over-the-counter NSAIDs (ibuprofen , naproxen , Advil , Aleve , etc.) while taking prescription NSAIDs.  May use Tylenol  [phone] mg 2 to 3 times a day for breakthrough pain. Follow up in 4 weeks.

## 2023-09-19 NOTE — Progress Notes (Signed)
 Ben Marsena Taff D[facility] [address] [place] [address] Phone: [phone]   Assessment and Plan:     1. Left foot pain 2. Acute left ankle pain 3. Inversion sprain of left ankle, initial encounter  -Acute, initial visit - Most consistent with inversion ankle sprain and foot contusion during fall occurring last night, 09/18/2023 - X-ray obtained in clinic.  My interpretation: No acute fracture or dislocation.  Degenerative changes in talus and navicular - Start meloxicam  15 mg daily x2 weeks.  If still having pain after 2 weeks, complete 3rd-week of NSAID. May use remaining NSAID as needed once daily for pain control.  Do not to use additional over-the-counter NSAIDs (ibuprofen , naproxen , Advil , Aleve , etc.) while taking prescription NSAIDs.  May use Tylenol  [phone] mg 2 to 3 times a day for breakthrough pain. - Goal of pain-free ambulation.  Patient may try lace up ankle brace, however if pain is present when using brace, then patient should use cam boot - Start HEP for range of motion of ankle to prevent stiffness  15 additional minutes spent for educating Therapeutic Home Exercise Program.  This included exercises focusing on stretching, strengthening, with focus on eccentric aspects.   Long term goals include an improvement in range of motion, strength, endurance as well as avoiding reinjury. Patient's frequency would include in 1-2 times a day, 3-5 times a week for a duration of 6-12 weeks. Proper technique shown and discussed handout in great detail with ATC.  All questions were discussed and answered.    Pertinent previous records reviewed include none  Follow Up: 4 weeks for evaluation.  If no improvement or worsening of symptoms, could consider advanced imaging versus physical therapy   Subjective:   I, Leone Ralphs am a scribe for Dr. Cleora Daft.    Chief Complaint: left foot pain   HPI:   09/19/23 Patient is a 63 year old  female with left foot pain. Patient states that she was trying to get up off the floor and she thinks her knee gave out and she fell on her foot. This happened last night. It got swollen and was really big last night. Some of the swelling has gone. DID ice it but not taking any pain medication right now.    Relevant Historical Information: Hypertension, GERD, DM type II  Additional pertinent review of systems negative.   Current Outpatient Medications:    acetaminophen  (TYLENOL ) 500 MG tablet, Take 1,000 mg by mouth every 6 (six) hours as needed for mild pain or headache., Disp: , Rfl:    albuterol  (VENTOLIN  HFA) 108 (90 Base) MCG/ACT inhaler, Inhale 2 puffs into the lungs every 6 (six) hours as needed for wheezing or shortness of breath., Disp: 8 g, Rfl: 2   aspirin  EC 81 MG tablet, Take 81 mg by mouth daily. Swallow whole., Disp: , Rfl:    calcium  carbonate (OS-CAL - DOSED IN MG OF ELEMENTAL CALCIUM ) 1250 (500 Ca) MG tablet, Take 2 tablets by mouth daily with breakfast., Disp: , Rfl:    carvedilol  (COREG ) 25 MG tablet, Take 1.5 tablets (37.5 mg total) by mouth 2 (two) times daily., Disp: 270 tablet, Rfl: 3   cholecalciferol (VITAMIN D3) 25 MCG (1000 UNIT) tablet, Take 2,000 Units by mouth daily., Disp: , Rfl:    clonazePAM  (KLONOPIN ) 0.5 MG tablet, Take 1 tablet (0.5 mg total) by mouth 3 (three) times daily as needed for anxiety., Disp: 270 tablet, Rfl: 0   EPINEPHrine  0.3  mg/0.3 mL IJ SOAJ injection, Inject 0.3 mg into the muscle as needed for anaphylaxis., Disp: 1 each, Rfl: 0   fenofibrate  (TRICOR ) 145 MG tablet, Take 1 tablet by mouth once daily, Disp: 30 tablet, Rfl: 11   fluticasone  (FLONASE ) 50 MCG/ACT nasal spray, Place 1 spray into both nostrils daily as needed for allergies or rhinitis., Disp: , Rfl:    folic acid  (FOLVITE ) 1 MG tablet, Take 1 tablet (1 mg total) by mouth daily., Disp: 90 tablet, Rfl: 1   furosemide  (LASIX ) 20 MG tablet, Take 1 tablet (20 mg total) by mouth daily.,  Disp: 20 tablet, Rfl: 0   hydrALAZINE  (APRESOLINE ) 50 MG tablet, Take 0.5 tablets (25 mg total) by mouth 3 (three) times daily., Disp: 270 tablet, Rfl: 2   Insulin  Pen Needle (BD PEN NEEDLE MICRO U/F) 32G X 6 MM MISC, Inject 1 Units into the skin once a week., Disp: 100 each, Rfl: 3   isosorbide  dinitrate (ISORDIL ) 20 MG tablet, TAKE 1 TABLET BY MOUTH THREE TIMES DAILY, Disp: 270 tablet, Rfl: 3   linaclotide  (LINZESS ) 290 MCG CAPS capsule, Take 1 capsule (290 mcg total) by mouth daily before breakfast., Disp: 30 capsule, Rfl: 11   Multiple Vitamins-Minerals (HAIR SKIN AND NAILS FORMULA) TABS, Take 1 tablet by mouth daily., Disp: , Rfl:    omega-3 acid ethyl esters (LOVAZA ) 1 g capsule, Take 2 capsules (2 g total) by mouth 2 (two) times daily., Disp: 360 capsule, Rfl: 2   omeprazole  (PRILOSEC) 40 MG capsule, Take 1 capsule (40 mg total) by mouth daily., Disp: 90 capsule, Rfl: 0   rosuvastatin  (CRESTOR ) 20 MG tablet, Take 1 tablet by mouth once daily, Disp: 90 tablet, Rfl: 2   sacubitril -valsartan  (ENTRESTO ) 97-103 MG, Take 1 tablet by mouth 2 (two) times daily., Disp: 180 tablet, Rfl: 3   spironolactone  (ALDACTONE ) 25 MG tablet, Take 1 tablet by mouth once daily, Disp: 90 tablet, Rfl: 3   tirzepatide (MOUNJARO) 15 MG/0.5ML Pen, Inject 15 mg into the skin once a week., Disp: 2 mL, Rfl: 11   zinc  gluconate 50 MG tablet, Take 1 tablet (50 mg total) by mouth daily., Disp: 90 tablet, Rfl: 1   Objective:     Vitals:   09/19/23 1520  BP: (!) 140/72  Pulse: 83  SpO2: 98%  Height: 5\' 4"  (1.626 m)      Body mass index is 29.01 kg/m.    Physical Exam:    Gen: Appears well, nad, nontoxic and pleasant Psych: Alert and oriented, appropriate mood and affect Neuro: sensation intact, strength is 5/5 with df/pf/inv/ev, muscle tone wnl Skin: no susupicious lesions or rashes  Left ankle:  No deformity, Generalized lateral ankle and midfoot swelling without ecchymosis, erythema, warmth TTP  significantly base of fifth, ATFL, CFL, and moderately navicular NTTP over fibular head, lat mal, medial mal, achilles,  deltoid, calcaneous or midfoot ROM DF 20, PF 30, inv/ev intact but limited Negative ant drawer, talar tilt, rotation test, squeeze test. Neg thompson pain with resisted inversion or eversion    Electronically signed by:  Marshall Skeeter D.[facility] 3:50 PM 09/19/23

## 2023-09-22 ENCOUNTER — Ambulatory Visit: Payer: Self-pay | Admitting: Sports Medicine

## 2023-10-16 NOTE — Progress Notes (Signed)
 [name] [name] [place] [address] Phone: [phone]   Assessment and Plan:     1. Left foot pain (Primary) 2. Acute left ankle pain 3. Inversion sprain of left ankle, subsequent encounter 4. Closed avulsion fracture of navicular bone of left foot with routine healing, subsequent encounter -Acute, improving, subsequent visit - Consistent with healing inversion ankle sprain occurring on 09/18/2023.  Reviewed x-ray which showed small navicular ossicle consistent with nondisplaced avulsion fracture with routine healing based on physical exam, improved function, nonpainful with ambulation - Recommend 2 additional weeks of pain-free ambulation, relative rest to allow for 6 weeks of healing for avulsion fracture, inversion ankle sprain.  Afterwards, may gradually return to activity as tolerated - Continue gentle HEP.  Start physical therapy after 2 weeks to improve strength and stability of ankle - Recommend using lace up ankle brace at upcoming concert and whenever experiencing pain with ambulation    Pertinent previous records reviewed include left foot x-ray 09/19/2023  Follow Up: Follow-up already scheduled with Dr. Claudene to review left knee pain.  May follow-up with me as needed if no improvement or worsening of symptoms.   Subjective:   I, Carolyn [name], am serving as a Neurosurgeon for Doctor Morene Mace  Chief Complaint: left foot pain    HPI:    09/19/23 Patient is a 63 year old female with left foot pain. Patient states that she was trying to get up off the floor and she thinks her knee gave out and she fell on her foot. This happened last night. It got swollen and was really big last night. Some of the swelling has gone. DID ice it but not taking any pain medication right now.    10/20/2023 Patient states she is doing so much better. Still decreased ROM    Relevant Historical Information: Hypertension, GERD, DM  type II  Additional pertinent review of systems negative.   Current Outpatient Medications:    acetaminophen  (TYLENOL ) 500 MG tablet, Take 1,000 mg by mouth every 6 (six) hours as needed for mild pain or headache., Disp: , Rfl:    albuterol  (VENTOLIN  HFA) 108 (90 Base) MCG/ACT inhaler, Inhale 2 puffs into the lungs every 6 (six) hours as needed for wheezing or shortness of breath., Disp: 8 g, Rfl: 2   aspirin  EC 81 MG tablet, Take 81 mg by mouth daily. Swallow whole., Disp: , Rfl:    calcium  carbonate (OS-CAL - DOSED IN MG OF ELEMENTAL CALCIUM ) 1250 (500 Ca) MG tablet, Take 2 tablets by mouth daily with breakfast., Disp: , Rfl:    carvedilol  (COREG ) 25 MG tablet, Take 1.5 tablets (37.5 mg total) by mouth 2 (two) times daily., Disp: 270 tablet, Rfl: 3   cholecalciferol (VITAMIN D3) 25 MCG (1000 UNIT) tablet, Take 2,000 Units by mouth daily., Disp: , Rfl:    clonazePAM  (KLONOPIN ) 0.5 MG tablet, Take 1 tablet (0.5 mg total) by mouth 3 (three) times daily as needed for anxiety., Disp: 270 tablet, Rfl: 0   EPINEPHrine  0.3 mg/0.3 mL IJ SOAJ injection, Inject 0.3 mg into the muscle as needed for anaphylaxis., Disp: 1 each, Rfl: 0   fenofibrate  (TRICOR ) 145 MG tablet, Take 1 tablet by mouth once daily, Disp: 30 tablet, Rfl: 11   fluticasone  (FLONASE ) 50 MCG/ACT nasal spray, Place 1 spray into both nostrils daily as needed for allergies or rhinitis., Disp: , Rfl:    folic acid  (FOLVITE ) 1 MG tablet, Take 1  tablet (1 mg total) by mouth daily., Disp: 90 tablet, Rfl: 1   furosemide  (LASIX ) 20 MG tablet, Take 1 tablet (20 mg total) by mouth daily., Disp: 20 tablet, Rfl: 0   hydrALAZINE  (APRESOLINE ) 50 MG tablet, Take 0.5 tablets (25 mg total) by mouth 3 (three) times daily., Disp: 270 tablet, Rfl: 2   Insulin  Pen Needle (BD PEN NEEDLE MICRO U/F) 32G X 6 MM MISC, Inject 1 Units into the skin once a week., Disp: 100 each, Rfl: 3   isosorbide  dinitrate (ISORDIL ) 20 MG tablet, TAKE 1 TABLET BY MOUTH THREE TIMES  DAILY, Disp: 270 tablet, Rfl: 3   linaclotide  (LINZESS ) 290 MCG CAPS capsule, Take 1 capsule (290 mcg total) by mouth daily before breakfast., Disp: 30 capsule, Rfl: 11   meloxicam  (MOBIC ) 15 MG tablet, Take 1 tablet (15 mg total) by mouth daily., Disp: 30 tablet, Rfl: 0   Multiple Vitamins-Minerals (HAIR SKIN AND NAILS FORMULA) TABS, Take 1 tablet by mouth daily., Disp: , Rfl:    omega-3 acid ethyl esters (LOVAZA ) 1 g capsule, Take 2 capsules (2 g total) by mouth 2 (two) times daily., Disp: 360 capsule, Rfl: 2   omeprazole  (PRILOSEC) 40 MG capsule, Take 1 capsule (40 mg total) by mouth daily., Disp: 90 capsule, Rfl: 0   rosuvastatin  (CRESTOR ) 20 MG tablet, Take 1 tablet by mouth once daily, Disp: 90 tablet, Rfl: 2   sacubitril -valsartan  (ENTRESTO ) 97-103 MG, Take 1 tablet by mouth 2 (two) times daily., Disp: 180 tablet, Rfl: 3   spironolactone  (ALDACTONE ) 25 MG tablet, Take 1 tablet by mouth once daily, Disp: 90 tablet, Rfl: 3   tirzepatide  (MOUNJARO ) 15 MG/0.5ML Pen, Inject 15 mg into the skin once a week., Disp: 2 mL, Rfl: 11   zinc  gluconate 50 MG tablet, Take 1 tablet (50 mg total) by mouth daily., Disp: 90 tablet, Rfl: 1   Objective:     Vitals:   10/20/23 1028  BP: 124/82  Pulse: 94  SpO2: 99%  Weight: 166 lb (75.3 kg)  Height: 5' 4 (1.626 m)      Body mass index is 28.49 kg/m.    Physical Exam:    Gen: Appears well, nad, nontoxic and pleasant Psych: Alert and oriented, appropriate mood and affect Neuro: sensation intact, strength is 5/5 with df/pf/inv/ev, muscle tone wnl Skin: no susupicious lesions or rashes   Left ankle:  No deformity, No swelling, ecchymosis, effusion TTP mildly ATFL, CFL, and   navicular NTTP over fibular head, lat mal, medial mal, achilles,  deltoid, calcaneous or midfoot ROM DF 20, PF 30, inv/ev intact but limited Negative ant drawer, talar tilt, rotation test, squeeze test. Neg thompson Mild pain with resisted inversion or eversion      Electronically signed by:  Odis Mace [name] 10:50 AM 10/20/23

## 2023-10-20 ENCOUNTER — Ambulatory Visit (INDEPENDENT_AMBULATORY_CARE_PROVIDER_SITE_OTHER): Admitting: Sports Medicine

## 2023-10-20 VITALS — BP 124/82 | HR 94 | Ht 64.0 in | Wt 166.0 lb

## 2023-10-20 DIAGNOSIS — S92252D Displaced fracture of navicular [scaphoid] of left foot, subsequent encounter for fracture with routine healing: Secondary | ICD-10-CM | POA: Diagnosis not present

## 2023-10-20 DIAGNOSIS — S93402D Sprain of unspecified ligament of left ankle, subsequent encounter: Secondary | ICD-10-CM | POA: Diagnosis not present

## 2023-10-20 DIAGNOSIS — M79672 Pain in left foot: Secondary | ICD-10-CM | POA: Diagnosis not present

## 2023-10-20 DIAGNOSIS — M25572 Pain in left ankle and joints of left foot: Secondary | ICD-10-CM

## 2023-10-20 LAB — HM DIABETES EYE EXAM

## 2023-10-20 NOTE — Patient Instructions (Signed)
 Recommend getting a lace up ankle brace and wearing it when having pain when walking and at concert  Relative rest 2 more weeks  -Recommend gradual return to physical activity.  Start activity at 50% (speed, duration, reps, sets, intensity) and allow 24 hours to assess for worsening pain.  If 50% is well-tolerated, may increase next activity to 75%.  If 75% is well-tolerated, may increase next physical activity to 100%.  If any of these levels cause pain, recommend dropping down to previous level for an additional 2-3 attempts before advancing.  PT referral recommend starting after 2 weeks of relative rest   As needed follow up for Dr. Leonce

## 2023-10-23 ENCOUNTER — Other Ambulatory Visit: Payer: Self-pay | Admitting: Internal Medicine

## 2023-10-23 DIAGNOSIS — K219 Gastro-esophageal reflux disease without esophagitis: Secondary | ICD-10-CM
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
## 2023-12-02 ENCOUNTER — Ambulatory Visit (INDEPENDENT_AMBULATORY_CARE_PROVIDER_SITE_OTHER): Admitting: Family Medicine

## 2023-12-02 ENCOUNTER — Other Ambulatory Visit: Payer: Self-pay

## 2023-12-02 ENCOUNTER — Encounter: Payer: Self-pay | Admitting: Family Medicine

## 2023-12-02 VITALS — BP 122/88 | Ht 64.0 in | Wt 162.0 lb

## 2023-12-02 DIAGNOSIS — M7062 Trochanteric bursitis, left hip: Secondary | ICD-10-CM | POA: Diagnosis not present

## 2023-12-02 DIAGNOSIS — M25572 Pain in left ankle and joints of left foot: Secondary | ICD-10-CM

## 2023-12-02 NOTE — Assessment & Plan Note (Signed)
 Patient is continuing to be active.  Discussed icing regimen and home exercises, discussed which activities to do and which ones to avoid.  Increase activity slowly.  Discussed icing regimen.  Follow-up with me again in 6 to 8 weeks otherwise.

## 2023-12-02 NOTE — Patient Instructions (Addendum)
 Good to see you today.   Injected GT today.  Follow up with your cardiologist for the syncopal episode.   See you back in 3-4 months.

## 2023-12-23 ENCOUNTER — Ambulatory Visit (INDEPENDENT_AMBULATORY_CARE_PROVIDER_SITE_OTHER): Admitting: Internal Medicine

## 2023-12-23 ENCOUNTER — Encounter: Payer: Self-pay | Admitting: Internal Medicine

## 2023-12-23 VITALS — BP 126/84 | HR 97 | Temp 98.4°F | Ht 64.0 in | Wt 159.0 lb

## 2023-12-23 DIAGNOSIS — E2740 Unspecified adrenocortical insufficiency: Secondary | ICD-10-CM | POA: Diagnosis not present

## 2023-12-23 DIAGNOSIS — U071 COVID-19: Secondary | ICD-10-CM | POA: Diagnosis not present

## 2023-12-23 DIAGNOSIS — I10 Essential (primary) hypertension: Secondary | ICD-10-CM | POA: Diagnosis not present

## 2023-12-23 DIAGNOSIS — R051 Acute cough: Secondary | ICD-10-CM

## 2023-12-23 LAB — POC COVID19 BINAXNOW: SARS Coronavirus 2 Ag: POSITIVE — AB

## 2023-12-23 MED ORDER — HYDROCODONE BIT-HOMATROP MBR 5-1.5 MG/5ML PO SOLN: 5.0000 mL | Freq: Four times a day (QID) | ORAL | 0 refills | Status: AC | PRN

## 2023-12-23 MED ORDER — MOLNUPIRAVIR 200 MG PO CAPS: 4.0000 | ORAL_CAPSULE | Freq: Two times a day (BID) | ORAL | 0 refills | Status: AC

## 2023-12-23 NOTE — Progress Notes (Signed)
 Subjective:    Patient ID: Carolyn [name], female    DOB: Apr 03, 1961, 63 y.o.   MRN: [id number]      HPI Carolyn [name] is here for  Chief Complaint  Patient presents with   Cough    Discussed the use of AI scribe software for clinical note transcription with the patient, who gave verbal consent to proceed.  History of Present Illness Carolyn [name] is a 63 year old female who presents with symptoms of a sore throat and cough.  Symptoms began on Sunday morning, 2 days ago, with a scratchy throat, worsening by Monday morning and described as 'horrible' by Monday night. She experiences a dry cough, blurred vision, dizziness, lightheadedness, nasal congestion, sinus pain, and ear pain, particularly on the right side, described as feeling like 'the whole right side is on fire'. She also reports a sore throat but no drainage down the back of her throat. No fever, chills, body aches, headaches, nausea, or diarrhea.  She has been taking Oscillococcinum, hydrocodone  cough syrup, and Benadryl, though she reports difficulty sleeping despite these medications. She has not been taking anything specifically for the sore throat.  She mentions a sick contact from a dinner on Saturday, where a companion reported having a scratchy throat after the meal.         Medications and allergies reviewed with patient and updated if appropriate.  Current Outpatient Medications on File Prior to Visit  Medication Sig Dispense Refill   acetaminophen  (TYLENOL ) 500 MG tablet Take 1,000 mg by mouth every 6 (six) hours as needed for mild pain or headache.     albuterol  (VENTOLIN  HFA) 108 (90 Base) MCG/ACT inhaler Inhale 2 puffs into the lungs every 6 (six) hours as needed for wheezing or shortness of breath. 8 g 2   aspirin  EC 81 MG tablet Take 81 mg by mouth daily. Swallow whole.     calcium  carbonate (OS-CAL - DOSED IN MG OF ELEMENTAL CALCIUM ) 1250 (500 Ca) MG tablet Take 2 tablets by mouth daily with  breakfast.     carvedilol  (COREG ) 25 MG tablet Take 1.5 tablets (37.5 mg total) by mouth 2 (two) times daily. 270 tablet 3   cholecalciferol (VITAMIN D3) 25 MCG (1000 UNIT) tablet Take 2,000 Units by mouth daily.     clonazePAM  (KLONOPIN ) 0.5 MG tablet Take 1 tablet (0.5 mg total) by mouth 3 (three) times daily as needed for anxiety. 270 tablet 0   EPINEPHrine  0.3 mg/0.3 mL IJ SOAJ injection Inject 0.3 mg into the muscle as needed for anaphylaxis. 1 each 0   fenofibrate  (TRICOR ) 145 MG tablet Take 1 tablet by mouth once daily 30 tablet 11   fluticasone  (FLONASE ) 50 MCG/ACT nasal spray Place 1 spray into both nostrils daily as needed for allergies or rhinitis.     folic acid  (FOLVITE ) 1 MG tablet Take 1 tablet (1 mg total) by mouth daily. 90 tablet 1   furosemide  (LASIX ) 20 MG tablet Take 1 tablet (20 mg total) by mouth daily. 20 tablet 0   hydrALAZINE  (APRESOLINE ) 50 MG tablet Take 0.5 tablets (25 mg total) by mouth 3 (three) times daily. 270 tablet 2   Insulin  Pen Needle (BD PEN NEEDLE MICRO U/F) 32G X 6 MM MISC Inject 1 Units into the skin once a week. 100 each 3   isosorbide  dinitrate (ISORDIL ) 20 MG tablet TAKE 1 TABLET BY MOUTH THREE TIMES DAILY 270 tablet 3   linaclotide  (LINZESS ) 290 MCG CAPS capsule Take 1  capsule (290 mcg total) by mouth daily before breakfast. 30 capsule 11   meloxicam  (MOBIC ) 15 MG tablet Take 1 tablet (15 mg total) by mouth daily. 30 tablet 0   Multiple Vitamins-Minerals (HAIR SKIN AND NAILS FORMULA) TABS Take 1 tablet by mouth daily.     omega-3 acid ethyl esters (LOVAZA ) 1 g capsule Take 2 capsules (2 g total) by mouth 2 (two) times daily. 360 capsule 2   omeprazole  (PRILOSEC) 40 MG capsule Take 1 capsule by mouth once daily 90 capsule 0   rosuvastatin  (CRESTOR ) 20 MG tablet Take 1 tablet by mouth once daily 90 tablet 2   sacubitril -valsartan  (ENTRESTO ) 97-103 MG Take 1 tablet by mouth 2 (two) times daily. 180 tablet 3   spironolactone  (ALDACTONE ) 25 MG tablet Take 1  tablet by mouth once daily 90 tablet 3   tirzepatide  (MOUNJARO ) 15 MG/0.5ML Pen Inject 15 mg into the skin once a week. 2 mL 11   zinc  gluconate 50 MG tablet Take 1 tablet (50 mg total) by mouth daily. 90 tablet 1   No current facility-administered medications on file prior to visit.    Review of Systems  Constitutional:  Positive for fatigue. Negative for appetite change, chills and fever.  HENT:  Positive for congestion (mild), ear pain (right), sinus pain and sore throat. Negative for postnasal drip.        Change in taste  Respiratory:  Positive for cough (dry). Negative for shortness of breath and wheezing.   Gastrointestinal:  Negative for diarrhea and nausea.  Musculoskeletal:  Negative for myalgias.  Neurological:  Positive for dizziness and light-headedness. Negative for headaches.       Objective:   Vitals:   12/23/23 1051  BP: 126/84  Pulse: 97  Temp: 98.4 F (36.9 C)  SpO2: 92%   BP Readings from Last 3 Encounters:  12/23/23 126/84  12/02/23 122/88  10/20/23 124/82   Wt Readings from Last 3 Encounters:  12/23/23 159 lb (72.1 kg)  12/02/23 162 lb (73.5 kg)  10/20/23 166 lb (75.3 kg)   Body mass index is 27.29 kg/m.    Physical Exam Constitutional:      General: She is not in acute distress.    Appearance: Normal appearance. She is not ill-appearing.  HENT:     Head: Normocephalic and atraumatic.     Right Ear: Tympanic membrane, ear canal and external ear normal.     Left Ear: Tympanic membrane, ear canal and external ear normal.     Mouth/Throat:     Mouth: Mucous membranes are moist.     Pharynx: No oropharyngeal exudate or posterior oropharyngeal erythema.  Eyes:     Conjunctiva/sclera: Conjunctivae normal.  Cardiovascular:     Rate and Rhythm: Normal rate and regular rhythm.  Pulmonary:     Effort: Pulmonary effort is normal. No respiratory distress.     Breath sounds: Normal breath sounds. No wheezing or rales.  Musculoskeletal:      Cervical back: Neck supple. No tenderness.  Lymphadenopathy:     Cervical: No cervical adenopathy.  Skin:    General: Skin is warm and dry.  Neurological:     Mental Status: She is alert.         COVID test is negative   Assessment & Plan:    Assessment and Plan Assessment & Plan COVID-19 infection Positive COVID-19 test with mild-mod symptoms.  Given adrenal insufficiency, heart failure, diabetes and many other chronic medical problems considered her high risk for  complications - Prescribe molnupiravir , 4 pills twice a day for 5 days.  Discussed possible side effects. - Prescribe hydrocodone  cough syrup for symptomatic relief. - Advise rest and increased fluid intake. - otc cold and pain medications for symptom relief  Adrenal insufficiency Adrenal insufficiency requiring steroid use during illness. - Instruct to take prescribed steroid for adrenal insufficiency as previously advised when sick.  Hypertension: -Chronic, controlled -Continue current medications including carvedilol  37.5 mg twice daily, hydralazine  25 mg 3 times daily, isosorbide  dinitrate 20 mg 3 times daily, Entresto  97-103 mg twice daily, spironolactone  25 mg daily

## 2023-12-23 NOTE — Patient Instructions (Addendum)
   You have Covid.      Medications changes include :   hydromet cough syrup, molnupiravir       Return if symptoms worsen or fail to improve.

## 2024-01-02 ENCOUNTER — Other Ambulatory Visit (HOSPITAL_COMMUNITY): Payer: Self-pay

## 2024-01-02 ENCOUNTER — Telehealth: Payer: Self-pay | Admitting: Pharmacy Technician

## 2024-01-02 ENCOUNTER — Encounter: Payer: Self-pay | Admitting: Pharmacist

## 2024-01-02 NOTE — Telephone Encounter (Signed)
  Still filled per test claim so I called [facility]    They said its the generic is 85.00 for 30 days. They said the insurance prefers generic now. Coupon would not work without being billed to primary

## 2024-01-05 ENCOUNTER — Encounter: Payer: Self-pay | Admitting: Internal Medicine
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
## 2024-01-06 ENCOUNTER — Telehealth (INDEPENDENT_AMBULATORY_CARE_PROVIDER_SITE_OTHER): Admitting: Internal Medicine

## 2024-01-06 DIAGNOSIS — B370 Candidal stomatitis: Secondary | ICD-10-CM

## 2024-01-06 DIAGNOSIS — B3731 Acute candidiasis of vulva and vagina: Secondary | ICD-10-CM

## 2024-01-06 DIAGNOSIS — U071 COVID-19: Secondary | ICD-10-CM | POA: Diagnosis not present

## 2024-01-06 MED ORDER — NYSTATIN 100000 UNIT/ML MT SUSP: 5.0000 mL | Freq: Four times a day (QID) | OROMUCOSAL | 0 refills | Status: AC

## 2024-01-06 MED ORDER — FLUCONAZOLE 150 MG PO TABS: 150.0000 mg | ORAL_TABLET | ORAL | 0 refills | Status: AC

## 2024-01-06 NOTE — Patient Instructions (Signed)
      Blood work was ordered.       Medications changes include :   None    A referral was ordered and someone will call you to schedule an appointment.     No follow-ups on file.

## 2024-01-13 ENCOUNTER — Other Ambulatory Visit: Payer: Self-pay | Admitting: Internal Medicine

## 2024-01-23 ENCOUNTER — Other Ambulatory Visit: Payer: Self-pay | Admitting: Internal Medicine

## 2024-02-02 ENCOUNTER — Ambulatory Visit: Payer: Self-pay

## 2024-02-02 NOTE — Telephone Encounter (Signed)
 2nd attempt:    Left voicemail for patient to call back

## 2024-02-02 NOTE — Telephone Encounter (Signed)
 This RN made first attempt to reach patient, left message with callback number   Message from Burnard DEL sent at 02/02/2024  9:57 AM EDT  Summary: thrush on tongue   Reason for Triage: knots on back of tongue,thrush on tongue

## 2024-02-02 NOTE — Telephone Encounter (Signed)
 Unable to reach patient for triage x 3 attempts.

## 2024-02-06 ENCOUNTER — Telehealth: Payer: Self-pay | Admitting: Cardiovascular Disease

## 2024-02-06 NOTE — Telephone Encounter (Signed)
 Spoke with pt regarding her fatigue. Pt stated she has been exhausted lately without reason. Pt stated she gets so tired that she feels like she cannot walk and has to lie down in her bed. Pt denied any chest pain but stated that she does have shortness of breath but only with activity. Pt denied feeling fluttering or like her heart is skipping a beat. Pt denied any lightheadedness or dizziness. Pt is going out of [place] country on [place] 1st and would like to take care of her fatigue before she leaves. Pt was told that Dr. Delford would be notified. Pt verbalized understanding. All questions if any were answered.

## 2024-02-06 NOTE — Telephone Encounter (Signed)
 Pt stated lately she's been feeling very fatigue and she's concerned about. Please advise

## 2024-02-06 NOTE — Telephone Encounter (Signed)
 Delford Maude BROCKS, MD to [facility] Triage (Selected Message)     02/06/24 12:34 PM She only has mildly decreased EF and no CAD. Doubt fatigue from heart. Should f/u with her primary and get lab work Also she has Addison's dx and is on steroids should f/u with endocrine consider adding random cortisol level to labs that primary does   Pt is aware of the above information and will continue her PCP for f/u.

## 2024-02-07 ENCOUNTER — Other Ambulatory Visit: Payer: Self-pay | Admitting: Cardiovascular Disease

## 2024-02-07 ENCOUNTER — Other Ambulatory Visit: Payer: Self-pay | Admitting: Internal Medicine

## 2024-02-09 ENCOUNTER — Encounter: Payer: Self-pay | Admitting: Pharmacist

## 2024-02-11 MED ORDER — FENOFIBRATE 145 MG PO TABS: 145.0000 mg | ORAL_TABLET | Freq: Every day | ORAL | 3 refills | Status: AC

## 2024-03-16 ENCOUNTER — Other Ambulatory Visit: Payer: Self-pay | Admitting: Internal Medicine

## 2024-03-16 DIAGNOSIS — D520 Dietary folate deficiency anemia: Secondary | ICD-10-CM

## 2024-03-16 DIAGNOSIS — K219 Gastro-esophageal reflux disease without esophagitis: Secondary | ICD-10-CM
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
## 2024-03-23 ENCOUNTER — Ambulatory Visit: Admitting: Family Medicine

## 2024-03-23 ENCOUNTER — Encounter: Payer: Self-pay | Admitting: Family Medicine

## 2024-03-23 VITALS — BP 112/74 | Ht 64.0 in | Wt 161.0 lb

## 2024-03-23 DIAGNOSIS — H00019 Hordeolum externum unspecified eye, unspecified eyelid: Secondary | ICD-10-CM | POA: Insufficient documentation

## 2024-03-23 DIAGNOSIS — M7062 Trochanteric bursitis, left hip: Secondary | ICD-10-CM

## 2024-03-23 DIAGNOSIS — H00014 Hordeolum externum left upper eyelid: Secondary | ICD-10-CM

## 2024-03-23 DIAGNOSIS — M1711 Unilateral primary osteoarthritis, right knee: Secondary | ICD-10-CM

## 2024-03-23 MED ORDER — AMOXICILLIN-POT CLAVULANATE 875-125 MG PO TABS: 1.0000 | ORAL_TABLET | Freq: Two times a day (BID) | ORAL | 0 refills | Status: AC

## 2024-03-23 NOTE — Assessment & Plan Note (Signed)
 Repeat injection given today, tolerated the procedure well, discussed with patient that there is a chance for lumbar radiculopathy increase activity slowly.  Discussed icing regimen.  Activity slowly.  Follow-up again in 6 to 12 weeks.

## 2024-03-23 NOTE — Assessment & Plan Note (Signed)
 Given antibiotics, discussed potential side effect.  Discussed warm compresses.  If does not resolve needs to see dermatology

## 2024-03-23 NOTE — Assessment & Plan Note (Signed)
 I responded extremely well to injections previously.  Patient is trying to stay active.  Doing well with her weight loss.  Do feel that patient's will increase the duration that she will have these knees.  Due to the worsening weather and pain that is affecting daily activities decided to do an injection today.  Discussed posture and ergonomics, home exercises, which activities to do and which ones to avoid.  Follow-up again in 12 weeks otherwise.

## 2024-03-23 NOTE — Patient Instructions (Addendum)
 Injection in knee and hip today Good to see you! Augmentin  prescribed See you again in 3 months

## 2024-05-01 ENCOUNTER — Encounter: Payer: Self-pay | Admitting: Internal Medicine

## 2024-06-21 ENCOUNTER — Ambulatory Visit: Admitting: Family Medicine
